# Patient Record
Sex: Male | Born: 1960 | Race: White | Hispanic: Refuse to answer | Marital: Married | Smoking: Former smoker
Health system: Southern US, Community
[De-identification: ages and names within clinical notes are randomized; demographics above are authoritative.]

## PROBLEM LIST (undated history)

## (undated) DIAGNOSIS — G8929 Other chronic pain: Secondary | ICD-10-CM

## (undated) DIAGNOSIS — J019 Acute sinusitis, unspecified: Secondary | ICD-10-CM

## (undated) DIAGNOSIS — N529 Male erectile dysfunction, unspecified: Secondary | ICD-10-CM

## (undated) DIAGNOSIS — M1990 Unspecified osteoarthritis, unspecified site: Secondary | ICD-10-CM

## (undated) DIAGNOSIS — F900 Attention-deficit hyperactivity disorder, predominantly inattentive type: Secondary | ICD-10-CM

## (undated) DIAGNOSIS — G4700 Insomnia, unspecified: Secondary | ICD-10-CM

## (undated) DIAGNOSIS — F329 Major depressive disorder, single episode, unspecified: Secondary | ICD-10-CM

## (undated) DIAGNOSIS — F32A Depression, unspecified: Secondary | ICD-10-CM

## (undated) DIAGNOSIS — E291 Testicular hypofunction: Secondary | ICD-10-CM

## (undated) DIAGNOSIS — L502 Urticaria due to cold and heat: Secondary | ICD-10-CM

## (undated) DIAGNOSIS — N400 Enlarged prostate without lower urinary tract symptoms: Secondary | ICD-10-CM

## (undated) DIAGNOSIS — N200 Calculus of kidney: Secondary | ICD-10-CM

## (undated) DIAGNOSIS — G5602 Carpal tunnel syndrome, left upper limb: Secondary | ICD-10-CM

## (undated) DIAGNOSIS — F419 Anxiety disorder, unspecified: Secondary | ICD-10-CM

## (undated) HISTORY — DX: Unspecified osteoarthritis, unspecified site: M19.90

## (undated) HISTORY — DX: Carpal tunnel syndrome, left upper limb: G56.02

## (undated) HISTORY — DX: Male erectile dysfunction, unspecified: N52.9

## (undated) HISTORY — DX: Testicular hypofunction: E29.1

## (undated) HISTORY — PX: APPENDECTOMY: SHX54

## (undated) HISTORY — DX: Benign prostatic hyperplasia without lower urinary tract symptoms: N40.0

## (undated) HISTORY — PX: FOOT SURGERY: SHX648

## (undated) HISTORY — PX: CERVICAL FUSION: SHX112

## (undated) HISTORY — DX: Other chronic pain: G89.29

## (undated) HISTORY — PX: LUMBAR DISC SURGERY: SHX700

## (undated) HISTORY — DX: Depression, unspecified: F32.A

## (undated) HISTORY — DX: Acute sinusitis, unspecified: J01.90

## (undated) HISTORY — DX: Anxiety disorder, unspecified: F41.9

## (undated) HISTORY — DX: Major depressive disorder, single episode, unspecified: F32.9

## (undated) HISTORY — DX: Calculus of kidney: N20.0

## (undated) HISTORY — DX: Attention-deficit hyperactivity disorder, predominantly inattentive type: F90.0

## (undated) HISTORY — DX: Insomnia, unspecified: G47.00

## (undated) HISTORY — DX: Urticaria due to cold and heat: L50.2

---

## 1998-08-05 ENCOUNTER — Emergency Department (HOSPITAL_COMMUNITY): Admission: EM | Admit: 1998-08-05 | Discharge: 1998-08-05 | Payer: Self-pay | Admitting: Internal Medicine

## 1998-08-05 ENCOUNTER — Encounter: Payer: Self-pay | Admitting: Internal Medicine

## 1998-12-16 ENCOUNTER — Emergency Department (HOSPITAL_COMMUNITY): Admission: EM | Admit: 1998-12-16 | Discharge: 1998-12-16 | Payer: Self-pay | Admitting: Emergency Medicine

## 1998-12-16 ENCOUNTER — Encounter: Payer: Self-pay | Admitting: Emergency Medicine

## 1999-04-07 ENCOUNTER — Emergency Department (HOSPITAL_COMMUNITY): Admission: EM | Admit: 1999-04-07 | Discharge: 1999-04-07 | Payer: Self-pay | Admitting: Emergency Medicine

## 1999-04-07 ENCOUNTER — Encounter: Payer: Self-pay | Admitting: Emergency Medicine

## 1999-04-16 ENCOUNTER — Encounter: Payer: Self-pay | Admitting: Internal Medicine

## 1999-04-16 ENCOUNTER — Emergency Department (HOSPITAL_COMMUNITY): Admission: EM | Admit: 1999-04-16 | Discharge: 1999-04-17 | Payer: Self-pay | Admitting: Internal Medicine

## 1999-04-19 ENCOUNTER — Inpatient Hospital Stay (HOSPITAL_COMMUNITY): Admission: EM | Admit: 1999-04-19 | Discharge: 1999-04-23 | Payer: Self-pay | Admitting: Emergency Medicine

## 1999-04-21 ENCOUNTER — Encounter: Payer: Self-pay | Admitting: Urology

## 1999-04-23 ENCOUNTER — Encounter: Payer: Self-pay | Admitting: Urology

## 1999-05-21 ENCOUNTER — Ambulatory Visit (HOSPITAL_COMMUNITY): Admission: RE | Admit: 1999-05-21 | Discharge: 1999-05-21 | Payer: Self-pay | Admitting: Gastroenterology

## 1999-05-21 ENCOUNTER — Encounter: Payer: Self-pay | Admitting: Gastroenterology

## 1999-06-03 ENCOUNTER — Ambulatory Visit (HOSPITAL_COMMUNITY): Admission: RE | Admit: 1999-06-03 | Discharge: 1999-06-03 | Payer: Self-pay | Admitting: Gastroenterology

## 1999-06-03 ENCOUNTER — Encounter: Payer: Self-pay | Admitting: Gastroenterology

## 2000-12-27 ENCOUNTER — Emergency Department (HOSPITAL_COMMUNITY): Admission: EM | Admit: 2000-12-27 | Discharge: 2000-12-27 | Payer: Self-pay | Admitting: Emergency Medicine

## 2000-12-27 ENCOUNTER — Encounter: Payer: Self-pay | Admitting: Emergency Medicine

## 2001-01-01 ENCOUNTER — Emergency Department (HOSPITAL_COMMUNITY): Admission: EM | Admit: 2001-01-01 | Discharge: 2001-01-01 | Payer: Self-pay | Admitting: Emergency Medicine

## 2001-01-05 ENCOUNTER — Encounter: Payer: Self-pay | Admitting: Urology

## 2001-01-05 ENCOUNTER — Ambulatory Visit (HOSPITAL_COMMUNITY): Admission: RE | Admit: 2001-01-05 | Discharge: 2001-01-05 | Payer: Self-pay | Admitting: Urology

## 2001-05-31 ENCOUNTER — Encounter: Admission: RE | Admit: 2001-05-31 | Discharge: 2001-08-29 | Payer: Self-pay

## 2001-08-04 ENCOUNTER — Emergency Department (HOSPITAL_COMMUNITY): Admission: EM | Admit: 2001-08-04 | Discharge: 2001-08-04 | Payer: Self-pay | Admitting: Emergency Medicine

## 2001-09-29 ENCOUNTER — Encounter: Admission: RE | Admit: 2001-09-29 | Discharge: 2001-12-28 | Payer: Self-pay | Admitting: Anesthesiology

## 2002-01-04 ENCOUNTER — Encounter: Admission: RE | Admit: 2002-01-04 | Discharge: 2002-04-04 | Payer: Self-pay

## 2002-05-03 ENCOUNTER — Encounter: Admission: RE | Admit: 2002-05-03 | Discharge: 2002-06-26 | Payer: Self-pay

## 2002-07-16 ENCOUNTER — Emergency Department (HOSPITAL_COMMUNITY): Admission: EM | Admit: 2002-07-16 | Discharge: 2002-07-16 | Payer: Self-pay | Admitting: Emergency Medicine

## 2004-07-31 ENCOUNTER — Ambulatory Visit: Payer: Self-pay | Admitting: Family Medicine

## 2005-08-16 ENCOUNTER — Encounter: Admission: RE | Admit: 2005-08-16 | Discharge: 2005-08-16 | Payer: Self-pay | Admitting: Family Medicine

## 2005-08-16 ENCOUNTER — Ambulatory Visit: Payer: Self-pay | Admitting: Family Medicine

## 2005-10-15 ENCOUNTER — Encounter: Admission: RE | Admit: 2005-10-15 | Discharge: 2005-10-15 | Payer: Self-pay | Admitting: Psychiatry

## 2006-01-26 ENCOUNTER — Encounter: Payer: Self-pay | Admitting: Urology

## 2006-01-26 ENCOUNTER — Encounter: Payer: Self-pay | Admitting: Emergency Medicine

## 2006-04-26 ENCOUNTER — Ambulatory Visit: Payer: Self-pay | Admitting: Family Medicine

## 2006-05-03 ENCOUNTER — Ambulatory Visit: Payer: Self-pay | Admitting: Family Medicine

## 2006-09-27 HISTORY — PX: CERVICAL FUSION: SHX112

## 2006-12-20 ENCOUNTER — Ambulatory Visit: Payer: Self-pay | Admitting: Internal Medicine

## 2007-01-03 ENCOUNTER — Encounter: Admission: RE | Admit: 2007-01-03 | Discharge: 2007-01-03 | Payer: Self-pay | Admitting: Cardiology

## 2007-05-03 ENCOUNTER — Ambulatory Visit: Payer: Self-pay | Admitting: Family Medicine

## 2007-05-03 DIAGNOSIS — L259 Unspecified contact dermatitis, unspecified cause: Secondary | ICD-10-CM | POA: Insufficient documentation

## 2007-07-21 ENCOUNTER — Ambulatory Visit: Payer: Self-pay | Admitting: Family Medicine

## 2007-07-21 LAB — CONVERTED CEMR LAB
Glucose, Urine, Semiquant: NEGATIVE
Ketones, urine, test strip: NEGATIVE
Nitrite: NEGATIVE
Specific Gravity, Urine: >=1.03
Urobilinogen, UA: 0.2
WBC Urine, dipstick: NEGATIVE
pH: 5

## 2007-07-24 LAB — CONVERTED CEMR LAB
AST: 36 units/L (ref 0–37)
Basophils Absolute: 0 10*3/uL (ref 0.0–0.1)
CO2: 31 meq/L (ref 19–32)
Eosinophils Relative: 1.1 % (ref 0.0–5.0)
GFR calc Af Amer: 84 mL/min
GFR calc non Af Amer: 70 mL/min
Glucose, Bld: 88 mg/dL (ref 70–99)
HCT: 43.4 % (ref 39.0–52.0)
Hemoglobin: 15.3 g/dL (ref 13.0–17.0)
Lymphocytes Relative: 20.4 % (ref 12.0–46.0)
Monocytes Relative: 5.9 % (ref 3.0–11.0)
Neutrophils Relative %: 72.4 % (ref 43.0–77.0)
Platelets: 208 10*3/uL (ref 150–400)
RDW: 12.1 % (ref 11.5–14.6)
TSH: 0.93 microintl units/mL (ref 0.35–5.50)
Total Bilirubin: 0.9 mg/dL (ref 0.3–1.2)
WBC: 7.7 10*3/uL (ref 4.5–10.5)

## 2007-08-28 ENCOUNTER — Ambulatory Visit: Payer: Self-pay | Admitting: Family Medicine

## 2007-08-28 DIAGNOSIS — G894 Chronic pain syndrome: Secondary | ICD-10-CM | POA: Insufficient documentation

## 2007-08-28 DIAGNOSIS — J309 Allergic rhinitis, unspecified: Secondary | ICD-10-CM | POA: Insufficient documentation

## 2007-08-28 DIAGNOSIS — G8929 Other chronic pain: Secondary | ICD-10-CM | POA: Insufficient documentation

## 2007-08-28 DIAGNOSIS — T783XXA Angioneurotic edema, initial encounter: Secondary | ICD-10-CM | POA: Insufficient documentation

## 2007-09-06 ENCOUNTER — Ambulatory Visit: Payer: Self-pay | Admitting: Family Medicine

## 2007-09-06 ENCOUNTER — Ambulatory Visit: Payer: Self-pay | Admitting: Cardiovascular Disease

## 2007-09-06 DIAGNOSIS — R109 Unspecified abdominal pain: Secondary | ICD-10-CM | POA: Insufficient documentation

## 2007-09-06 LAB — CONVERTED CEMR LAB
Blood in Urine, dipstick: NEGATIVE
Protein, U semiquant: NEGATIVE
Specific Gravity, Urine: >=1.03
Urobilinogen, UA: 0.2
WBC Urine, dipstick: NEGATIVE
pH: 5

## 2007-09-07 ENCOUNTER — Telehealth: Payer: Self-pay | Admitting: Family Medicine

## 2007-09-13 ENCOUNTER — Ambulatory Visit: Payer: Self-pay | Admitting: Urology

## 2008-01-11 ENCOUNTER — Ambulatory Visit: Payer: Self-pay | Admitting: Family Medicine

## 2008-01-11 LAB — CONVERTED CEMR LAB
Cholesterol: 232 mg/dL (ref 0–200)
Direct LDL: 183.3 mg/dL
HDL: 36.8 mg/dL — ABNORMAL LOW (ref >39.0)
Total CHOL/HDL Ratio: 6.3
Triglycerides: 82 mg/dL (ref 0–149)

## 2008-01-15 ENCOUNTER — Telehealth: Payer: Self-pay | Admitting: Family Medicine

## 2008-03-13 ENCOUNTER — Emergency Department: Payer: Self-pay | Admitting: Emergency Medicine

## 2008-03-15 ENCOUNTER — Encounter: Admission: RE | Admit: 2008-03-15 | Discharge: 2008-03-15 | Payer: Self-pay | Admitting: Anesthesiology

## 2008-06-30 DIAGNOSIS — L255 Unspecified contact dermatitis due to plants, except food: Secondary | ICD-10-CM | POA: Insufficient documentation

## 2008-07-02 ENCOUNTER — Ambulatory Visit: Payer: Self-pay | Admitting: Family Medicine

## 2008-10-03 ENCOUNTER — Ambulatory Visit: Payer: Self-pay | Admitting: Family Medicine

## 2008-10-24 ENCOUNTER — Telehealth: Payer: Self-pay | Admitting: Family Medicine

## 2008-11-04 ENCOUNTER — Telehealth: Payer: Self-pay | Admitting: *Deleted

## 2008-11-04 ENCOUNTER — Encounter: Payer: Self-pay | Admitting: Family Medicine

## 2008-11-04 ENCOUNTER — Telehealth: Payer: Self-pay | Admitting: Family Medicine

## 2009-01-17 ENCOUNTER — Ambulatory Visit: Payer: Self-pay | Admitting: Family Medicine

## 2009-01-17 LAB — CONVERTED CEMR LAB
ALT: 27 units/L (ref 0–53)
AST: 26 units/L (ref 0–37)
Alkaline Phosphatase: 60 units/L (ref 39–117)
BUN: 16 mg/dL (ref 6–23)
Basophils Absolute: 0 10*3/uL (ref 0.0–0.1)
Basophils Relative: 0.1 % (ref 0.0–3.0)
Chloride: 106 meq/L (ref 96–112)
Cholesterol: 226 mg/dL — ABNORMAL HIGH (ref 0–200)
Creatinine, Ser: 1 mg/dL (ref 0.4–1.5)
Eosinophils Absolute: 0 10*3/uL (ref 0.0–0.7)
GFR calc non Af Amer: 85.25 mL/min (ref >60)
Glucose, Urine, Semiquant: NEGATIVE
HCT: 46.8 % (ref 39.0–52.0)
Hemoglobin: 15.8 g/dL (ref 13.0–17.0)
Lymphocytes Relative: 23 % (ref 12.0–46.0)
MCHC: 33.7 g/dL (ref 30.0–36.0)
MCV: 88 fL (ref 78.0–100.0)
Neutro Abs: 4.8 10*3/uL (ref 1.4–7.7)
Platelets: 217 10*3/uL (ref 150.0–400.0)
RBC: 5.32 M/uL (ref 4.22–5.81)
RDW: 12.2 % (ref 11.5–14.6)
Specific Gravity, Urine: 1.02
TSH: 0.42 microintl units/mL (ref 0.35–5.50)
Total Bilirubin: 1 mg/dL (ref 0.3–1.2)
Triglycerides: 59 mg/dL (ref 0.0–149.0)
Urobilinogen, UA: 0.2

## 2009-01-21 ENCOUNTER — Telehealth: Payer: Self-pay | Admitting: Family Medicine

## 2009-02-28 ENCOUNTER — Ambulatory Visit: Payer: Self-pay | Admitting: Family Medicine

## 2009-02-28 DIAGNOSIS — G4700 Insomnia, unspecified: Secondary | ICD-10-CM | POA: Insufficient documentation

## 2009-03-19 ENCOUNTER — Encounter: Admission: RE | Admit: 2009-03-19 | Discharge: 2009-03-19 | Payer: Self-pay | Admitting: Orthopaedic Surgery

## 2009-05-01 ENCOUNTER — Telehealth: Payer: Self-pay | Admitting: Family Medicine

## 2009-05-02 ENCOUNTER — Ambulatory Visit: Payer: Self-pay | Admitting: Family Medicine

## 2009-05-02 LAB — CONVERTED CEMR LAB
Free T4: 1 ng/dL (ref 0.6–1.6)
T3, Free: 3.3 pg/mL (ref 2.3–4.2)

## 2009-05-19 ENCOUNTER — Telehealth: Payer: Self-pay | Admitting: *Deleted

## 2009-05-22 ENCOUNTER — Ambulatory Visit: Payer: Self-pay | Admitting: Family Medicine

## 2009-05-22 DIAGNOSIS — R5381 Other malaise: Secondary | ICD-10-CM | POA: Insufficient documentation

## 2009-05-22 DIAGNOSIS — R5383 Other fatigue: Secondary | ICD-10-CM

## 2009-06-13 ENCOUNTER — Ambulatory Visit: Payer: Self-pay | Admitting: Endocrinology

## 2009-06-13 DIAGNOSIS — E291 Testicular hypofunction: Secondary | ICD-10-CM | POA: Insufficient documentation

## 2009-06-13 DIAGNOSIS — E230 Hypopituitarism: Secondary | ICD-10-CM | POA: Insufficient documentation

## 2009-06-13 LAB — CONVERTED CEMR LAB
CO2: 31 meq/L (ref 19–32)
Chloride: 105 meq/L (ref 96–112)
Creatinine, Ser: 1.2 mg/dL (ref 0.4–1.5)
Saturation Ratios: 27.3 % (ref 20.0–50.0)
Testosterone: 236.88 ng/dL — ABNORMAL LOW (ref 350.00–890.00)
Transferrin: 246.2 mg/dL (ref 212.0–360.0)

## 2009-06-17 ENCOUNTER — Telehealth: Payer: Self-pay | Admitting: Endocrinology

## 2009-07-09 ENCOUNTER — Telehealth (INDEPENDENT_AMBULATORY_CARE_PROVIDER_SITE_OTHER): Payer: Self-pay | Admitting: *Deleted

## 2009-07-18 ENCOUNTER — Telehealth: Payer: Self-pay | Admitting: Endocrinology

## 2009-07-22 ENCOUNTER — Encounter: Admission: RE | Admit: 2009-07-22 | Discharge: 2009-07-22 | Payer: Self-pay | Admitting: Endocrinology

## 2009-08-12 ENCOUNTER — Telehealth: Payer: Self-pay | Admitting: Family Medicine

## 2009-09-01 ENCOUNTER — Telehealth: Payer: Self-pay | Admitting: Family Medicine

## 2009-11-06 ENCOUNTER — Encounter: Payer: Self-pay | Admitting: Family Medicine

## 2009-11-06 HISTORY — PX: COLONOSCOPY: SHX174

## 2010-01-27 ENCOUNTER — Ambulatory Visit: Payer: Self-pay | Admitting: Family Medicine

## 2010-01-27 DIAGNOSIS — J029 Acute pharyngitis, unspecified: Secondary | ICD-10-CM | POA: Insufficient documentation

## 2010-02-02 ENCOUNTER — Telehealth: Payer: Self-pay | Admitting: Family Medicine

## 2010-02-02 ENCOUNTER — Ambulatory Visit: Payer: Self-pay | Admitting: Family Medicine

## 2010-07-01 ENCOUNTER — Ambulatory Visit: Payer: Self-pay | Admitting: Family Medicine

## 2010-07-01 LAB — CONVERTED CEMR LAB
Alkaline Phosphatase: 54 units/L (ref 39–117)
BUN: 18 mg/dL (ref 6–23)
Blood in Urine, dipstick: NEGATIVE
CO2: 30 meq/L (ref 19–32)
Calcium: 9.6 mg/dL (ref 8.4–10.5)
Chloride: 102 meq/L (ref 96–112)
Creatinine, Ser: 1.3 mg/dL (ref 0.4–1.5)
Eosinophils Absolute: 0.1 10*3/uL (ref 0.0–0.7)
Eosinophils Relative: 0.7 % (ref 0.0–5.0)
Glucose, Urine, Semiquant: NEGATIVE
Lymphocytes Relative: 15.4 % (ref 12.0–46.0)
Lymphs Abs: 1.5 10*3/uL (ref 0.7–4.0)
Monocytes Absolute: 0.4 10*3/uL (ref 0.1–1.0)
Monocytes Relative: 4.5 % (ref 3.0–12.0)
Platelets: 240 10*3/uL (ref 150.0–400.0)
Potassium: 4.3 meq/L (ref 3.5–5.1)
RBC: 5.49 M/uL (ref 4.22–5.81)
RDW: 13.2 % (ref 11.5–14.6)
TSH: 0.55 microintl units/mL (ref 0.35–5.50)
Triglycerides: 112 mg/dL (ref 0.0–149.0)
Urobilinogen, UA: 0.2
VLDL: 22.4 mg/dL (ref 0.0–40.0)
WBC Urine, dipstick: NEGATIVE
pH: 5

## 2010-07-20 ENCOUNTER — Encounter: Payer: Self-pay | Admitting: Family Medicine

## 2010-07-20 ENCOUNTER — Ambulatory Visit: Payer: Self-pay | Admitting: Family Medicine

## 2010-07-29 ENCOUNTER — Encounter: Payer: Self-pay | Admitting: Family Medicine

## 2010-10-17 ENCOUNTER — Encounter: Payer: Self-pay | Admitting: Psychiatry
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
## 2010-10-29 NOTE — Miscellaneous (Signed)
Summary: tdap update  Clinical Lists Changes  Observations: Added new observation of TD BOOSTER: Historical (09/27/2000 11:49)      Immunization History:  Tetanus/Td Immunization History:    Tetanus/Td:  historical (09/27/2000)

## 2010-10-29 NOTE — Progress Notes (Signed)
Summary: REQ FOR RX (ABX?)  Phone Note Call from Patient   Caller: Spouse    [phone] Reason for Call: Talk to Nurse, Talk to Doctor Summary of Call: Pts wife called in to speak with Fleet Contras, CMA / Dr Tawanna Cooler.... wanted to adv that her husband is not any better - still exp h/a, coughing up greenish, yellow colored mucus, sinus congestion and pressure..... Req that Rx (abx?) be sent into [facility], [place].   Initial call taken by: Debbra Riding,  Feb 02, 2010 11:55 AM  Follow-up for Phone Call        if he is not any better.  I need to see and I can't call in medication without examining him.  Okay to work in today Follow-up by: Roderick Pee MD,  Feb 02, 2010 12:07 PM  Additional Follow-up for Phone Call Additional follow up Details #1::        Phone Call Completed  -  Contacted pt and he agreed to come in today at 2pm for eval of sxs.  Additional Follow-up by: Debbra Riding,  Feb 02, 2010 12:14 PM

## 2010-10-29 NOTE — Assessment & Plan Note (Signed)
Summary: st/njr   Vital Signs:  Patient profile:   50 year old male Weight:      159 pounds Temp:     98.3 degrees F oral BP sitting:   116 / 80  (left arm) Cuff size:   regular  Vitals Entered By: Kern Reap CMA Duncan Dull) (Jan 27, 2010 12:45 PM) CC: sore throat, productive cough Is Patient Diabetic? No Pain Assessment Patient in pain? yes        Primary Care Provider:  Dr Tawanna Cooler  CC:  sore throat and productive cough.  History of Present Illness: Elohim is a 50 year old [place] male, nonsmoker, who comes in today with a week's history of sore throat, cough.  History of fever.  Review of systems otherwise negative.  He is not been in contact with anybody.  He said any infectious diseases  Allergies: 1)  ! Toradol 2)  ! Codeine  Social History: Reviewed history from 08/28/2007 and no changes required. Married Former Smoker Alcohol use-no Drug use-no Regular exercise-yes Retired Surveyor, minerals by trade, totally disabled  Review of Systems      See HPI  Physical Exam  General:  Well-developed,well-nourished,in no acute distress; alert,appropriate and cooperative throughout examination Head:  Normocephalic and atraumatic without obvious abnormalities. No apparent alopecia or balding. Eyes:  No corneal or conjunctival inflammation noted. EOMI. Perrla. Funduscopic exam benign, without hemorrhages, exudates or papilledema. Vision grossly normal. Ears:  External ear exam shows no significant lesions or deformities.  Otoscopic examination reveals clear canals, tympanic membranes are intact bilaterally without bulging, retraction, inflammation or discharge. Hearing is grossly normal bilaterally. Nose:  External nasal examination shows no deformity or inflammation. Nasal mucosa are pink and moist without lesions or exudates. Mouth:  Oral mucosa and oropharynx without lesions or exudates.  Teeth in good repair. Neck:  No deformities, masses, or tenderness noted. Chest  Wall:  No deformities, masses, tenderness or gynecomastia noted. Lungs:  Normal respiratory effort, chest expands symmetrically. Lungs are clear to auscultation, no crackles or wheezes.   Impression & Recommendations:  Problem # 1:  SORE THROAT (ICD-462) Assessment New  Orders: Rapid Strep ([address])  Complete Medication List: 1)  Zyrtec Allergy 10 Mg Tabs (Cetirizine hcl) .... As needed 2)  Skelaxin 800 Mg Tabs (Metaxalone) .... Once daily as needed 3)  Duragesic-100 100 Mcg/hr Pt72 (Fentanyl) .... Once every 48 hrs 4)  Trazodone Hcl 50 Mg Tabs (Trazodone hcl) .Marland Kitchen.. 1 &1/2 at bedtime 5)  Voltaren 1 % Gel (Diclofenac sodium) .Marland Kitchen.. 1 gram as needed 6)  Epipen 2-pak 0.3 Mg/0.81ml (1:1000) Devi (Epinephrine hcl (anaphylaxis)) .... As needed 7)  Oxycodone Hcl 15 Mg Tabs (Oxycodone hcl) .... Take half to one tab every six hours as needed for pain 8)  Prednisone 20 Mg Tabs (Prednisone) .... Uad 9)  Fluticasone Propionate 50 Mcg/act Susp (Fluticasone propionate) .... 2 sprays per nostrile two times a day 10)  Acyclovir 800 Mg Tabs (Acyclovir) .Marland Kitchen.. 1 &1/2 daily 11)  Valium 10 Mg Tabs (Diazepam) .... Take one tab three times a day 12)  Clomiphene Citrate 50 Mg Tabs (Clomiphene citrate) .... 1/4 tab qd 13)  Restoril 15 Mg Caps (Temazepam) .... Take one tab by mouth once daily 14)  Hydromet 5-1.5 Mg/46ml Syrp (Hydrocodone-homatropine) .Marland Kitchen.. 1 or 2 tsps three times a day prn  Patient Instructions: 1)  drank 40 ounces of water daily, swish and spit with Xylocaine, every two to 3 hours as needed for sore throat, pain. 2)  You may also  take one or 2 teaspoons of Hydromet, up to 3 times a day to help with the cough and sore throat pain.  Return p.r.n. Prescriptions: PREDNISONE 20 MG TABS (PREDNISONE) UAD  #30 x 1   Entered and Authorized by:   Roderick Pee MD   Signed by:   Roderick Pee MD on 01/27/2010   Method used:   Electronically to        [facility]* (retail)       [address]        [place], [place]  [address]       Ph: [phone]       Fax: [phone]   RxID:   [id number] HYDROMET 5-1.5 MG/5ML SYRP (HYDROCODONE-HOMATROPINE) 1 or 2 tsps three times a day prn  #8oz x 0   Entered and Authorized by:   Roderick Pee MD   Signed by:   Roderick Pee MD on 01/27/2010   Method used:   Print then Give to Patient   RxID:   [id number]  In the ER and

## 2010-10-29 NOTE — Assessment & Plan Note (Signed)
Summary: SINUSITIS // RS   Vital Signs:  Patient profile:   50 year old male Temp:     98 degrees F Resp:     12 per minute BP sitting:   110 / 84  (left arm) CC: Sore Throat/ Migraine Headache   Primary Care Provider:  Dr Tawanna Cooler  CC:  Sore Throat/ Migraine Headache.  History of Present Illness: [name] is a 50 year old, married male, nonsmoker, who comes in today for evaluation of a headache.  He states he was well until last Friday night, around 10 p.m. he developed the sudden onset of a severe right occipital headache.  By about 11 o'clock.  It was very intense.  He woke his wife up it hurts so bad.  She gave him some of her migraine medication and headache slowly abated over about 8 hours.  Now the headache persists, but is dull.  His worse problem is a severe sore throat that won't go away.  He said no fever, but a right-sided earache.  Nonproductive cough.  We have seen him last week.  I made the thirdfor a viral syndrome  Allergies: 1)  ! Toradol 2)  ! Codeine  Past History:  Past medical, surgical, family and social histories (including risk factors) reviewed for relevance to current acute and chronic problems.  Past Medical History: Reviewed history from 09/06/2007 and no changes required. lumbar disk surgery x 2 chronic pain, sees Dr. Rosalita Chessman Hedgecock history hip C. normal.  LFTs cold urticaria, etiology unknown appendectomy surgery at [facility], 1999 with Senaida Ores, Cage failed to relieve pain kidney stones x 2  Past Surgical History: Reviewed history from 10/03/2008 and no changes required. Cervical fusion   09 Cervical laminectomy  09  Family History: Reviewed history from 08/28/2007 and no changes required. Family History of CAD Male 1st degree relative <60 Family History Diabetes 1st degree relative Family History High cholesterol father also had gout  Social History: Reviewed history from 08/28/2007 and no changes required. Married Former  Smoker Alcohol use-no Drug use-no Regular exercise-yes Retired Surveyor, minerals by trade, totally disabled  Review of Systems      See HPI  Physical Exam  General:  Well-developed,well-nourished,in no acute distress; alert,appropriate and cooperative throughout examination Head:  Normocephalic and atraumatic without obvious abnormalities. No apparent alopecia or balding. Eyes:  No corneal or conjunctival inflammation noted. EOMI. Perrla. Funduscopic exam benign, without hemorrhages, exudates or papilledema. Vision grossly normal. Ears:  External ear exam shows no significant lesions or deformities.  Otoscopic examination reveals clear canals, tympanic membranes are intact bilaterally without bulging, retraction, inflammation or discharge. Hearing is grossly normal bilaterally. Mouth:  bilateral exudative  pharyngitis Neck:  No deformities, masses, or tenderness noted. Chest Wall:  No deformities, masses, tenderness or gynecomastia noted. Lungs:  Normal respiratory effort, chest expands symmetrically. Lungs are clear to auscultation, no crackles or wheezes.   Impression & Recommendations:  Problem # 1:  STREPTOCOCCAL PHARYNGITIS (ICD-034.0) Assessment New  His updated medication list for this problem includes:    Amoxicillin 500 Mg Caps (Amoxicillin) .Marland Kitchen... Take 1 tablet by mouth two times a day  Orders: Rapid Strep ([address])  Complete Medication List: 1)  Zyrtec Allergy 10 Mg Tabs (Cetirizine hcl) .... As needed 2)  Skelaxin 800 Mg Tabs (Metaxalone) .... Once daily as needed 3)  Duragesic-100 100 Mcg/hr Pt72 (Fentanyl) .... Once every 48 hrs 4)  Trazodone Hcl 50 Mg Tabs (Trazodone hcl) .Marland Kitchen.. 1 &1/2 at bedtime 5)  Voltaren 1 % Gel (Diclofenac  sodium) .Marland Kitchen.. 1 gram as needed 6)  Epipen 2-pak 0.3 Mg/0.50ml (1:1000) Devi (Epinephrine hcl (anaphylaxis)) .... As needed 7)  Oxycodone Hcl 15 Mg Tabs (Oxycodone hcl) .... Take half to one tab every six hours as needed for pain 8)  Prednisone  20 Mg Tabs (Prednisone) .... Uad 9)  Fluticasone Propionate 50 Mcg/act Susp (Fluticasone propionate) .... 2 sprays per nostrile two times a day 10)  Acyclovir 800 Mg Tabs (Acyclovir) .Marland Kitchen.. 1 &1/2 daily 11)  Valium 10 Mg Tabs (Diazepam) .... Take one tab three times a day 12)  Clomiphene Citrate 50 Mg Tabs (Clomiphene citrate) .... 1/4 tab qd 13)  Restoril 15 Mg Caps (Temazepam) .... Take one tab by mouth once daily 14)  Hydromet 5-1.5 Mg/34ml Syrp (Hydrocodone-homatropine) .Marland Kitchen.. 1 or 2 tsps three times a day prn 15)  Amoxicillin 500 Mg Caps (Amoxicillin) .... Take 1 tablet by mouth two times a day  Patient Instructions: 1)  amoxicillin 500 mg directions two in the a.m., one in the p.m., x 10 days, Prescriptions: AMOXICILLIN 500 MG CAPS (AMOXICILLIN) Take 1 tablet by mouth two times a day  #30 x 1   Entered and Authorized by:   Roderick Pee MD   Signed by:   Roderick Pee MD on 02/02/2010   Method used:   Electronically to        [facility]* (retail)       [address]       [place], [place]  [address]       Ph: [phone]       Fax: [phone]   RxID:   [id number] AMOXICILLIN 500 MG CAPS (AMOXICILLIN) Take 1 tablet by mouth two times a day  #30 x 1   Entered and Authorized by:   Roderick Pee MD   Signed by:   Roderick Pee MD on 02/02/2010   Method used:   [facility]       [address]       [place], [place]  [address]       Ph: [phone]       Fax: [phone]   RxID:   [id number]   Appended Document: SINUSITIS // RS     Allergies: 1)  ! Toradol 2)  ! Codeine   Complete Medication List: 1)  Zyrtec Allergy 10 Mg Tabs (Cetirizine hcl) .... As needed 2)  Skelaxin 800 Mg Tabs (Metaxalone) .... Once daily as needed 3)  Duragesic-100 100 Mcg/hr Pt72 (Fentanyl) .... Once every 48 hrs 4)  Trazodone Hcl 50 Mg Tabs (Trazodone hcl) .Marland Kitchen.. 1 &1/2 at bedtime 5)  Voltaren 1 % Gel  (Diclofenac sodium) .Marland Kitchen.. 1 gram as needed 6)  Epipen 2-pak 0.3 Mg/0.31ml (1:1000) Devi (Epinephrine hcl (anaphylaxis)) .... As needed 7)  Oxycodone Hcl 15 Mg Tabs (Oxycodone hcl) .... Take half to one tab every six hours as needed for pain 8)  Prednisone 20 Mg Tabs (Prednisone) .... Uad 9)  Fluticasone Propionate 50 Mcg/act Susp (Fluticasone propionate) .... 2 sprays per nostrile two times a day 10)  Acyclovir 800 Mg Tabs (Acyclovir) .Marland Kitchen.. 1 &1/2 daily 11)  Valium 10 Mg Tabs (Diazepam) .... Take one tab three times a day 12)  Clomiphene Citrate 50 Mg Tabs (Clomiphene citrate) .... 1/4 tab qd 13)  Restoril 15 Mg Caps (Temazepam) .... Take one tab by mouth once daily 14)  Hydromet  5-1.5 Mg/13ml Syrp (Hydrocodone-homatropine) .Marland Kitchen.. 1 or 2 tsps three times a day prn 15)  Amoxicillin 500 Mg Caps (Amoxicillin) .... Take 1 tablet by mouth two times a day   Laboratory Results  Date/Time Received: Feb 02, 2010   Other Tests  Rapid Strep: negative Comments: Kern Reap CMA Duncan Dull)  Feb 02, 2010 4:24 PM

## 2011-02-12 NOTE — Consult Note (Signed)
[facility]  Patient:    [name], [name] Visit Number: [id number] MRN: [id number]          Service Type: PMG Location: [facility] Attending Physician:  Sondra Come Dictated by:   Sondra Come, D.O. Proc. Date: 01/10/02 Admit Date:  01/04/2002                            Consultation Report  REASON FOR VISIT:  Mr. Harkin returns to clinic today as scheduled for re-evaluation.  The patient was last seen on December 06, 2001.  Patient has been doing somewhat better over the past month.  He really has intentions of taking control of his pain with the ultimate goal of getting off of some of the medications.  He asked several questions and I answered them regarding his treatment options and future plans in terms of his pain management.  He is eager to start aquatic therapy, and I encourage him to do so.  His pain today is a 5/10 on a subjective scale involving his low back and radiating to his lower extremities.  He continues to use a cane for assistance, and we discussed ultimately discontinuing this.  He continues on Duragesic patch 75 mcg with OxyIR 5 mg two to three times per day for breakthrough pain.  He has had significant improvement with the Lidoderm patch, as he uses it at bedtime.  He also continues to take trazodone for restorative sleep capacity. His function and quality of life indices remain stable.  His sleep is improved.  He did not tolerate gabitril.  His exercise regimen has increased to every other day as opposed to every three days.  I reviewed health and history form and 14-point review of systems.  The patient continues to have excessive worry and continues to see Onalee Hua L. Dellia Cloud, Ph.D., in behavioral health.  PHYSICAL EXAMINATION:  GENERAL:  A healthy male in no acute distress.  VITAL SIGNS:  Blood pressure 144/77, pulse 76, respirations 12, O2 saturation 100% on room air.  MUSCULOSKELETAL/NEUROLOGIC:  Manual muscle testing is  5/5 bilateral lower extremities.  Sensory exam is intact to light touch bilateral lower extremities at this time.  Muscle stretch reflexes are 2+/4 bilateral patellar, medial hamstrings, and Achilles.  IMPRESSION: 1. Postlaminectomy syndrome. 2. Anxiety disorder.  PLAN: 1. Had a thorough discussion with Mr. Joost and his wife regarding treatment    options.  Again I encouraged him to begin aquatic therapy. 2. Continue Duragesic 75 mcg q.48h., #15 without refills. 3. Continue OxyIR 5 mg one p.o. t.i.d. p.r.n. breakthrough pain, #90    without refills. 4. Will start Baclofen 10 mg one-half to one p.o. t.i.d. as needed for its    _____ effects, #60 without refills. 5. Continue trazodone. 6. Consider weaning from Valium. 7. Discussed with patient further literature on back pain to include Mind    Over Back Pain by Dr. Imagene Riches. 8. Patient to return to clinic in one month for re-evaluation.  Patient was educated on the above findings and recommendations and understands.  No barriers to communication. Dictated by:   Sondra Come, D.O. Attending Physician:  Sondra Come DD:  01/10/02 TD:  01/11/02 Job: [id number]

## 2011-02-12 NOTE — Consult Note (Signed)
[facility]  Patient:    [name], [name] Visit Number: [id number] MRN: [id number]          Service Type: PMG Location: [facility] Attending Physician:  Rolly Salter Dictated by:   Jewel Baize Stevphen Rochester, M.D. Admit Date:  09/29/2001   CC:         Onalee Hua L. Dellia Cloud, Ph.D. [facility]                          Consultation Report  Mariana [facility].  I evaluate and review health and history form and 14 point review of systems.  His wife is present and I spent a considerable period of time discussing treatment limitations and options with him.  This consultation is in excess of 20 minutes and I discussed the reasonable approaches to dealing with this pain.  He has had a consultation with the spine surgeon and they have discouraged him from proceeding with either neuroplasty or dorsal column stimulation.  I do not really understand this rationale as I understand the international literature it is not an unreasonable expectation for some improvement in quality of life indexes, functional indexes, and decreased likelihood of escalation of narcotic based pain medications by addressing this approach.  We are going to continue him on his current medication.  I will go ahead and add Phenergan for some occasional nausea.  He continues with Dr. Dellia Cloud.  Do not believe further imaging or diagnostics are warranted.  He is stable at presentation today.  States no wish to harm himself or others.  Review of patient care agreement.  OBJECTIVE:  BACK:  He has diffuse paracervical, suprascapular, paralumbar myofascial discomfort.  Impaired flexion/extension of the lumbar spine.  NEUROLOGIC:  No new neurologic features-motor, sensory, reflexes.  IMPRESSION:  Degenerative spinal disease lumbar spine, post laminectomy syndrome.  PLAN:  Conservative management.  Consider him a likely candidate for neuroplasty or dorsal column  stimulation.  Will see him in follow-up. Extensive consultation. Dictated by:   Jewel Baize Stevphen Rochester, M.D. Attending Physician:  Rolly Salter DD:  10/03/01 TD:  10/03/01 Job: [id number]

## 2011-02-12 NOTE — Consult Note (Signed)
NAME:  [name], [name]                    ACCOUNT NO.:  [id number]   MEDICAL RECORD NO.:  [id number]                   PATIENT TYPE:  REC   LOCATION:  [facility]                                  FACILITY:  [facility]   PHYSICIAN:  Sondra Come, D.O.                 DATE OF BIRTH:  [birth year]   DATE OF CONSULTATION:  05/04/2002  DATE OF DISCHARGE:                  PHYSICAL MEDICINE & REHABILITATION CONSULTATION   HISTORY OF PRESENT ILLNESS:  The patient returns to clinic today as  scheduled for reevaluation.  He was last seen on April 05, 2002.  In the  interim, he had an exacerbation of his low back pain on April 18, 2002.  He  went to his family [place], Dr. Clent Ridges, who gave him a Demerol and Phenergan  shot as well as increased his OxyIR to q.i.d. as needed for breakthrough  pain.  Dr. Clent Ridges and I discussed this on the phone that day.  As of last  visit, the patient seemed to be doing fairly well with pain level ranging  around 4/10 on a subjective scale.  He was performing some exercises which  seemed to be helping him significantly with the goal of getting off of some  of his medications.  However, with this exacerbation, he notes constant pain  and numbness into his right lower extremity and a nondermatomal  distribution.  He was referred to see Dr. Gaetano Net, neurosurgeon, at [facility] in [place], [place], who ordered an MRI of his  lumbar spine with contrast at [facility].  The patient has a  follow-up next week with Dr. Gaetano Net in regard to any further surgical  intervention.  In addition, the patient tells me that his insurance company  has ordered a second functional capacity evaluation scheduled for May 10, 2002, and states that he had one previously but I do not have results of  that evaluation.  He continues on Duragesic 75 mcg q.48h. with OxyIR 5 mg  three to four times per day.  He also continues on Flexeril as needed for  spasm and he also  takes Valium five times a day for anxiety.  He is  accompanied today by his wife and his mother.  I reviewed the health and  history form and 14-point review of systems.   PHYSICAL EXAMINATION:  GENERAL APPEARANCE:  A healthy-appearing male in no  acute distress.  The patient is in mild obvious discomfort, however.  VITAL SIGNS:  Blood pressure 146/94, pulse 116, respiratory rate 18, O2  saturations 98% on room air.  NEUROLOGIC:  No new findings neurologically in the lower extremities  including motor, sensory and reflexes at this time.   IMPRESSION:  1. Post laminectomy syndrome with acute exacerbation of low back pain.  2. Anxiety disorder.  3. Depression.   PLAN:  1. I had a long discussion with the patient, his mother and his wife     regarding further treatment options.  We are somewhat limited in     medication options as the patient has not tolerated many of the     medications tried including increased Duragesic dose.  He has not     tolerated any of the membrane stabilizing medication to try to help with     his neuropathic and radicular component.  He also has worsening     constipation with increased narcotic dosages.  We discussed continuing     the current medications and waiting for the results of his MRI and     further treatment options per Dr. Gaetano Net.  Also discussed other possible     modalities such as a spinal cord simulator trial if he is not a surgical     candidate.  We have provided him with a video and booklet describing the     spinal cord stimulator.  2. Continue Duragesic 75 mcg q.48h. #15 without refills.  3. Continue OxyIR 5 mg one to two p.o. t.i.d. as needed #100 without     refills.  4. Will give prescription for Phenergan 25 mg one p.o. q.6h. as needed for     nausea, #30 with two refills.  5. The patient is to return to clinic in one month for reevaluation.   The patient was educated in the above findings and recommendations and  understands.   There were no barriers of communication.                                                Sondra Come, D.O.    JJW/MEDQ  D:  05/04/2002  T:  05/09/2002  Job:  [id number]   cc:   Jeannett Senior A. Clent Ridges, M.D. [facility]

## 2011-02-12 NOTE — Consult Note (Signed)
NAME:  [name], [name]                    ACCOUNT NO.:  [id number]   MEDICAL RECORD NO.:  [id number]                   PATIENT TYPE:  REC   LOCATION:  [facility]                                  FACILITY:  [facility]   PHYSICIAN:  Sondra Come, D.O.                 DATE OF BIRTH:  1961/01/04   DATE OF CONSULTATION:  DATE OF DISCHARGE:                                   CONSULTATION   HISTORY OF PRESENT ILLNESS:  The patient returns to the clinic today as  scheduled for reevaluation.  He was last seen on May 04, 2002.  In the  interim he has noted some mild improvement in his low back pain as he  relates it as a 5/10 on a subjective scale today.  His functional and  quality of life indices remain somewhat declined still but, overall, seem to  be improving to a modest degree.  His sleep is poor and seems to be his main  complaint today.  He has also had an MRI in the interim and has been  followed by Dr. Gaetano Net, who has not recommended surgery and states that he  did not see a pinched nerve in his back.  The patient continues to have some  nausea which is not relieved with Phenergan.  The patient has questions  about Marinol, apparently which his wife takes from her pain clinic.  He  continues with chigong exercise program.  He continues on Duragesic 75 mcg  q.48. and OxyIR 5 mg two to three per day which has been fairly stable over  the last year.  He also continues on trazodone without any improvement in  his sleep and has been on Ambien remotely and will discuss this with his  psychiatrist.  He presents today accompanied by his wife and mother who have  multiple questions.  These are answered.  I reviewed health and history form  and 14-point review of systems.   PHYSICAL EXAMINATION:  GENERAL:  Healthy male in no acute distress.  VITAL SIGNS:  Blood pressure is 123/81, pulse 97, respirations 18, O2  saturation is 97% on room air.  BACK:  Level pelvis without scoliosis.  There is a  midline incisional scar.  There is tenderness to palpation bilateral lumbar paraspinals.  No new  neurologic findings in the lower extremities including motor, sensory, and  reflexes.   IMPRESSION:  1. Postlaminectomy syndrome.  2. Anxiety disorder.  3. History of depression.   PLAN:  1. I had a long discussion with the patient, his wife, and his mother     regarding further treatment options.  This was an extensive consultation     of 25 minutes' duration.  At this point we will continue with Duragesic     75 mcg q.48h., #15 without refills.  2. Will continue with OxyIR 1 p.o. b.i.d. to t.i.d. as needed for     breakthrough pain, #75  without refills.  The patient does bring his pills     and used patches with him today for appropriate count.  3. Will give consideration to addition of Marinol if indicated.  I would     like to do some research in this regard.  4. Patient to continue with his home exercise program.  We discussed that     his best outcome is when he takes control of his pain.  Some of the steps     in doing this include a consistent home exercise program and, perhaps,     meditation.  This would likely improve his depressive and anxiety     components.  5. Patient to return to clinic in three months for reevaluation.  Patient     instructed that we will continue current medications over the next three     months and that he will need to come back on a monthly basis to pick up     his prescriptions as he has been stable on these medications for     approximately one year.  In addition, will obtain a urine drug screen     today with full informed consent.   The patient was educated on the above findings and recommendations and  understands.  There were no barriers to communication.                                                Sondra Come, D.O.    JJW/MEDQ  D:  06/01/2002  T:  06/01/2002  Job:  [id number]   cc:   Tera Mater. Clent Ridges, M.D. [facility]

## 2011-02-12 NOTE — Consult Note (Signed)
[place]  Patient:    [name], [name] Visit Number: [id number] MRN: [id number]          Service Type: PMG Location: [facility] Attending Physician:  Rolly Salter Dictated by:   Sondra Come, D.O. Proc. Date: 11/02/01 Admit Date:  09/29/2001                            Consultation Report  [name] returns to clinic today as scheduled for reevaluation.  He is accompanied by his wife.  He states that his pain overall is slightly improved.  He continues to have pain his low back and now complains of some pain in his parascapular region.  His pain level is a 7/10 on a subjective scale.  He continues on Duragesic 75 mcg q.48h. with OxyIR 5 mg t.i.d. as needed for pain.  He states that he is typically taking the OxyIr b.i.d. over the past several weeks.  He also continues with Baldemar Friday as well as trazodone.  He has been walking on a treadmill for 10-15 minutes every other day with some discomfort post exercise but he feels like this is helping him overall.  He also continues seeing Dr. Dellia Cloud for behavioral health on a monthly basis.  He is having daily bowel movements, but still complains of some constipation.  I review health and history form and 14 point review of systems.  PHYSICAL EXAMINATION  GENERAL:  Healthy male in no acute distress.  VITAL SIGNS:  Blood pressure 130/84, respirations 20, pulse 72.  NEUROLOGIC:  Manual muscle testing is 5/5 bilateral lower extremities. Sensory examination is intact to light touch bilateral lower extremities at this time.  Muscle stretch reflexes are 2+/4 bilateral lower extremities. BACK:  Palpatory examination reveals tenderness to palpation in the lumbar paraspinals as well as myofascial tenderness in the cervicothoracic paraspinals and scapular musculature.  IMPRESSION: 1. Post laminectomy syndrome. 2. Myofascial upper back pain.  PLAN: 1. Patient to continue Duragesic patch 75 mcg  q.48h. #15 without refills. 2. Continue OxyIR 5 mg one p.o. t.i.d. as needed for breakthrough pain #90    without refills. 3. Continue Kristalose 25 g powder one p.o. q.d. as needed #30 with three    refills. 4. Patient to resume scapular stabilization exercises to include shoulder    shrugs and shoulder retractions. 5. Will set patient up with [facility] for neuromuscular stimulation for his    upper back pain and lower back pain. 6. Patient will return to clinic in five to six weeks for reevaluation.  Patient was educated on the above findings and recommendations and understands.  There were no barriers to communication. Dictated by:   Sondra Come, D.O. Attending Physician:  Rolly Salter DD:  11/02/01 TD:  11/03/01 Job: [id number]

## 2011-02-12 NOTE — Consult Note (Signed)
[facility]  Patient:    [name], [name] Visit Number: [id number] MRN: [id number]          Service Type: PMG Attending Physician:  Sondra Come Dictated by:   Sondra Come, D.O. Proc. Date: 04/05/02 Admit Date:  01/04/2002 Discharge Date: 04/04/2002                            Consultation Report  HISTORY:  Mr. Barner returns to the clinic today as scheduled for reevaluation. He was last seen on March 09, 2002. He continues to complain of severe low back pain on occasion but today his pain level is 4/10 on a subjective scale. He states he is doing some better after starting to perform qigong exercises. His goal is to advance to taichi. He does complain of intermittent nausea which he attributes to his pain. He continues on Duragesic 75 mcg q. 48 hours and oxyIR 5 mg for breakthrough pain b.i.d. and as appropriate on pills. I reviewed the health and history form and 14 point review of systems. The patient continues to complain of neuropathic type pain into his right lower extremity.  PHYSICAL EXAMINATION:  Reveals a healthy male in no acute distress. Blood pressure 143/81, pulse 81, respirations 13, O2 saturation 97% on room air. Manual muscle testing is 5/5 bilateral lower extremities. Sensory examination reveals decreased light touch in the right lower extremity in the anterolateral thigh, also slightly into the lateral leg. Muscle stretch reflexes are 2+/4 bilateral patella, medial hamstrings and Achilles.  IMPRESSION: 1. Post laminectomy syndrome. 2. Anxiety disorder. 3. Depression.  PLAN: 1. Encouraged the patient to continue with his exercise program. 2. Will continue Duragesic 75 mg q. 48h #15 without refills for now.    Ultimately would like to start to wean as the patient continues with    his exercise program and shows functioning and quality of life index    improvement. 3. Continue oxyIR 5 mg one p.o. b.i.d. as needed for  breakthrough pain    only #60 without refills. 4. Will give the patient a trial of zonegran 100 mg 1 p.o. q.d. for    neuropathic component. 5. The patient to return to clinic in month for reevaluation.  The patient was educated on the above findings and recommendations and understands. There were no barriers to communication. Dictated by:   Sondra Come, D.O. Attending Physician:  Sondra Come DD:  04/05/02 TD:  04/07/02 Job: [id number]

## 2011-02-12 NOTE — Consult Note (Signed)
[facility]  Patient:    French, Ronald Visit Number: [id number] MRN: [id number]          Service Type: PMG Location: [facility] Attending Physician:  Sondra Come Dictated by:   Sondra Come, D.O. Proc. Date: 06/28/01 Admit Date:  05/31/2001                            Consultation Report  HISTORY OF PRESENT ILLNESS:  Mr. [name] returns to the clinic today as scheduled.  His initial visit was on May 31, 2001.  The patient has chronic low back pain following L5-S1 fusion in 1995 x 2 with nerve decompression.  The patient has had long-term medication management, mainly with narcotic analgesics and extensive workup and interventional procedures in the past.  He continues to complain of pain in his low back radiating intermittently to his right posterior thigh.  Pain is currently 7/10 on a subjective scale.  His pain is characterized as constant, sharp, stabbing, with numbness and tingling.  He was previously performing aquatic aerobic exercises but has stopped doing this for two reasons; #1, he states that he has financial concerns and, #2, he states that the water is too cold for him. He is waiting for the rehabilitation facility to complete a therapeutically heated pool.  He also states that he has noticed a decline in his ability to walk long distances and occasionally has to use a scooter at the store.  His last epidural steroid injection was either in 1998 or 1999; he could not remember for sure.  He continues to take Duragesic 75 mcg every two days, OxyIR 5 mg 2-5 pills per day, Valium 5 mg daily, and trazodone 50 mg at bedtime.  He has had trials of Neurontin which he was not able to tolerate secondary to dizziness and Topamax which caused nausea.  He denies any bowel and bladder dysfunction.  Denies constipation.  He continues to take ______ for that.  PHYSICAL EXAMINATION:  Healthy male in no acute distress.  There has been  no significant change in his examination.  Manual muscle testing is 5/5 bilateral lower extremities.  Sensory exam reveals a patchy decrease to light touch bilaterally in a nondermatome distribution.  Muscle stretch reflexes are 2+/4 bilateral patellae, medial hamstrings, and Achilles.  Lumbar paraspinals were diffusely tender to palpation.  Range of motion was guarded and decreased in all planes secondary to pain.  IMPRESSION:  Degenerative disk disease of the lumbar spine, status post lumbar surgery with L5-S1 Ray cage fusion.  The patient has chronic, persistent low back pain with intermittent right lower extremity radicular symptoms in an S1 distribution.  PLAN: 1. I discussed with the patient and his wife further therapeutic options.  I    voiced my concern regarding narcotic escalation, and the patient agrees and    is not interested in increasing his narcotic medications at this point    given that previous trials with increased doses of Duragesic made him    significantly somnolent as well as constipated.  The patient is more    interested in discussing any further interventional therapies that might    help his pain.  We discussed Racz catheterization as well as possible    spinal cord stimulator trial.  I had Dr. Stevphen Rochester also discuss these two    procedures with Mr. [name].  The patient is in agreement, and we will    schedule the  patient for a trial of neuroplasty with Racz procedure.  May    consider stimulator trial in the future depending on response to Racz    procedure. 2. Will renew medications to include Duragesic patch 75 mcg 1 patch every    48 hours, #15 with no refills. 3. OxyIR 5 mg 1-2 p.o. q.6h. as needed for breakthrough pain, #120 with no    refills. 4. ______ 20 g 1 p.o. q.d. as needed. 5. The patient was counseled on narcotic usage and his intention for pain    relief and improved function.  The patient understands.  The patient is at    no risk to harm  himself or others. 6. This was comprehensive consultation lasting greater than 25 minutes with    greater than 50% of the time spent discussing procedures and counseling.  The patient was educated on the above findings and recommendations and understands.  There were no barriers to communication. Dictated by:   Sondra Come, D.O. Attending Physician:  Sondra Come DD:  06/28/01 TD:  06/28/01 Job: [id number]
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
## 2011-02-12 NOTE — Consult Note (Signed)
[facility]  Patient:    [name] Visit Number: [id number] MRN: [id number]          Service Type: Attending:  Sondra Come, D.O. Dictated by:   Sondra Come, D.O. Proc. Date: 03/09/02                            Consultation Report  REASON FOR CONSULTATION:  [name] returns to clinic today as scheduled for re-evaluation.  He was last seen on 02/09/02.  He continues to have 7/10 pain on a subjective scale.  He continues to focus on his pain to a significant degree.  He started using the lidoderm patches with some modest improvement in his pain.  It has allowed him to decrease his requirement for Oxy IR.  He brings his pills with him, and has 39 pills remaining on his previous prescription.  He also has started taking intestinal formula #1, and other herbal type medications.  He discusses getting involved in [employer] exercise program this weekend, which according to him, is similar to Tai-Chi to try to improve his exercise tolerance and decrease his pain level.  He basically states that he is getting tired of his pain and wishes that he can do something that helps his pain besides medications.  Ultimately, he wishes to wean himself from the Oxy IR and the Duragesic.  We discussed this at length. I discussed that we would need to do this slowly so that he does not have withdrawal symptoms.  He continues with his psychiatrist, and I encourage him to followup.  I had written a referral for him to see the [facility] psychologist for a trial of hypnotherapy, however, his insurance will not cover this.  REVIEW OF SYSTEMS:  I review the health and history form and 14 point review of systems.  No new neurologic complaints.  PHYSICAL EXAMINATION:  GENERAL:  A healthy male in no acute distress.  VITAL SIGNS:  Blood pressure 138/90, pulse 89, respiratory rate 10, O2 saturation 99% on room air.  NEUROLOGIC:  No new neurologic findings in the  lower extremities, including motor, sensory, and reflexes.  There is still tenderness to palpation over the lumbar paraspinals.  IMPRESSION: 1. Post-laminectomy syndrome. 2. Anxiety disorder. 3. Depression.  PLAN: 1. The patient is encouraged to increase his exercise program as tolerated. 2. Continue Duragesic 75 mcg q.48h. for now #15 without refills.  I instruct    the patient that if he would like to try to get off his medication, the    first thing to do is to change his frequency of patches to q.72h., and then    we would discuss switching him to a 50 mcg patch, 25 mcg patch, and    ultimately discontinue with a tapering dose of oral medication. 3. Continue Oxy IR 5 mg one p.o. b.i.d. p.r.n. breakthrough pain #30 without    refills.  The patient can add this to his remaining 39 pills. 4. Continue lidoderm patches. 5. Continue crystalose and intestinal formula #1 for bowel regimen. 6. The patient is to return to clinic in one month for re-evaluation.  The patient was educated on the above findings and recommendations and understands.  There were no barriers to communication. Dictated by:   Sondra Come, D.O. Attending:  Sondra Come, D.O. DD:  03/09/02 TD:  03/12/02 Job: [id number]

## 2011-02-12 NOTE — Consult Note (Signed)
[facility]  Patient:    DEMARQUIS, OSLEY Visit Number: [id number] MRN: [id number]          Service Type: PMG Location: [facility] Attending Physician:  Sondra Come Dictated by:   Sondra Come, D.O. Proc. Date: 02/09/02 Admit Date:  01/04/2002                            Consultation Report  Mr. Lafitte returns to clinic today as scheduled for reevaluation.  Last seen on January 10, 2002.  No significant changes in the interim, although patient states that he has had intermittent pain radiating from the right side of his back into his right lower extremity.  He describes this as an electrical jolt feeling lasting approximately one minute.  He states this happens about two to three times per month.  Also, he notes increased worrying and worsening of depression.  He has a psychiatrist, Dr. Gaynell Face, with whom he has a follow-up appointment in June.  He wishes to move this up.  His pain today is a 5/10 on a subjective scale.  He has been stable in terms of function and quality of life indexes.  Again, we discuss further treatment options.  He currently takes Duragesic 75 mcg q.48h. with OxyIR 5 mg t.i.d. as needed for breakthrough pain and has 12 pills remaining.  He is no longer following with Dr. Dellia Cloud as he feels he has plateaued in that regard.  We discussed possibly giving consideration to hypnotherapy in an effort to assist with overall pain control.  Patient has some significant financial concerns which seem to make his anxiety worse which I feel is contributing to his overall pain perception.  He continues to perform a home based exercise program and I encourage him to continue.  PHYSICAL EXAMINATION  GENERAL:  Healthy male in no acute distress.  VITAL SIGNS:  Blood pressure 138/85, pulse 85, respirations 16, O2 saturation 100% on room air.  NEUROLOGIC:  Manual muscle testing is 5/5 bilateral lower extremities. Sensory examination is  intact to light touch bilateral lower extremities. Muscle stretch reflexes are 2+/4 bilateral patellar, medial hamstrings, and Achilles.  EXTREMITIES:  No heat, erythema, or edema in the lower extremities.  BACK:  There is tenderness to palpation lumbar paraspinals.  IMPRESSION: 1. Post laminectomy syndrome. 2. Anxiety disorder. 3. Depression.  PLAN: 1. This was an extensive consultation with Mr. Gerhold and his wife.  I    answered multiple questions.  Greater than 25 minutes duration. 2. Continue Duragesic 75 mcg q.48h. for now #15 without refills. 3. Continue OxyIR 5 mg one p.o. t.i.d. p.r.n. breakthrough pain #90 without    refills. 4. Will refer patient to Dr. Moise Boring for trial of hypnotherapy. 5. Patient to get back in to see his psychiatrist to discuss further    management of his anxiety and depression which I feel is a significant    contributing factor in terms of overall pain perception. 6. Patient to continue home based exercise program. 7. Continue Lidoderm patches. 8. Discontinue Baclofen. 9. Patient to return to clinic in one month for reevaluation.  Patient was educated on the above findings and recommendations and understands.  No barriers to communication. Dictated by:   Sondra Come, D.O. Attending Physician:  Sondra Come DD:  02/09/02 TD:  02/12/02 Job: [id number]

## 2011-02-12 NOTE — Consult Note (Signed)
[facility]  Patient:    [name], [name] Visit Number: [id number] MRN: [id number]          Service Type: PMG Location: [facility] Attending Physician:  Rolly Salter Dictated by:   Sondra Come, D.O. Proc. Date: 12/06/01 Admit Date:  09/29/2001                            Consultation Report  Mr. Dhawan returns to clinic today as scheduled for reevaluation.  He continues to complain of severe low back pain with pain radiating into bilateral lower extremities on occasion.  His symptoms essentially have not changed.  His function and quality of life indexes remain somewhat declined. His sleep is fair to good with trazodone 50 mg at bedtime.  He continues using Duragesic 75 mcg q.48h. with OxyIR 5 mg for breakthrough pain t.i.d. p.r.n. The patient also continues to use Valium 5 mg five pills per day for anxiety. He continues to have bowel movements with the use of kristalose 20 mg daily. Mr. Diesel continues to see Dr. Dellia Cloud for behavioral health psychology. He continues a walking program, but would like to get back into aquatic therapy.  He states that he would like to get some type of control over his pain and states that currently the pain seems to be controlling him.  We discussed this at length.  We discussed medications at length.  This was an extensive consultation.  He is accompanied by his wife.  He has financial concerns and this puts some restraints on medications that we could potentially use to assist in his treatment.  We will look into patient assistance programs.  I reviewed health and history form and 14 point review of systems.  PHYSICAL EXAMINATION  GENERAL:  Healthy male in no acute distress.  Mood and affect are appropriate.  VITAL SIGNS:  Blood pressure 120/76, pulse 91, respirations 16, O2 saturation 98% on room air.  BACK:  Decreased lumbar lordosis with midline vertical incisional scar.  There is slight listing to the  left.  There is tenderness to palpation bilateral lumbosacral paraspinous region.  NEUROLOGIC:  Manual muscle testing is 5/5 bilateral lower extremities. Sensory examination is intact to light touch bilateral lower extremities at this time.  Muscle stretch reflexes are 2+/4 bilateral patellar, medial hamstrings, and Achilles.  Patient has tight hamstrings bilaterally.  IMPRESSION: 1. Postlaminectomy syndrome. 2. Anxiety, which I feel is contributing to patients pain perception.  PLAN: 1. Again, I had a long discussion with Mr. Summer and his wife regarding    medication options at this point.  This was an extensive consultation    greater than 25 minutes in duration.  We discussed non-narcotic adjunctive    medications to help with his pain management.  Currently, he is primarily    taking narcotic analgesics as the sole form of pain management in addition    to trazodone 50 mg at bedtime for sleep and Valium for anxiety. 2. Will continue Duragesic 75 mcg one q.48h. #15 without refills. 3. Continue OxyIR 5 mg one p.o. t.i.d. p.r.n. breakthrough pain #90 without    refills to be filled after December 11, 2001. 4. In terms of non-narcotic adjunctive therapy, I will prescribe Humibid DM    one p.o. b.i.d. #60 with one refill for the NMDA antagonistic effect of    dextromethorphan. 5. Will prescribe gabitril 4 mg one p.o. t.i.d. #90 with one refill.  I  have    given patient 20 sample pills to be taken one p.o. q.h.s. x3 days, then    b.i.d. x3 days, then t.i.d.  Patient does express some concern regarding    finances and not being able to pay for this medication.  Again, we will    look into patient assistance programs. 6. Will prescribe Lidoderm 5% patch apply one to two to his back up to 12    hours q.d. #30 with one refill.  I have given patient samples of four    patches.  Again, financial concerns may preclude him obtaining this    medication. 7. Physical therapy for aquatic therapy  for range of motion, stretching, and a    walking program leading to a patient directed home program two to three    times per week for four weeks. 8. I am concerned regarding patients possible inability to pay for some of    the adjunctive non-narcotic medications because I think that he will    benefit from rational polypharmacy in regards to treating his pain.  I do    not feel that narcotic analgesics are the only answer to his pain. 9. Patient to return to clinic in one month for reevaluation.  The patient was educated on the above findings and recommendations and understands.  There were no barriers to communication. Dictated by:   Sondra Come, D.O. Attending Physician:  Rolly Salter DD:  12/06/01 TD:  12/07/01 Job: [id number]

## 2011-02-12 NOTE — Consult Note (Signed)
[facility]  Patient:    [name], [name] Visit Number: [id number] MRN: [id number]          Service Type: PMG Location: [facility] Attending Physician:  Sondra Come Dictated by:   Jewel Baize Stevphen Rochester, M.D. Admit Date:  05/31/2001                            Consultation Report  REASON FOR CONSULTATION:  [facility]  I evaluate him.  He is accompanied by his wife and mother.  Extensive consultation, answering many questions, face-to-face time in excess of 25 minutes.  1. The patient comes in today, is appropriate to his medication, and brings it    in for count; in fact, he has dropped down on the OxyIR and so I will    decrease him from q.i.d. breakthrough to t.i.d. and hopefully to b.i.d. 2. He is having difficulty with sleep.  He has started an activity level that    I think is going to enhance his restorative sleep capacity, but he is still    having difficulty with actually falling asleep.  It does not sound like we    need a sleep study right now, he does not have any nighttime awakening, but    perhaps these medications need to be adjusted. 3. We will go ahead and see him in three to four weeks.  I just do not think    we need to move ahead with a neuroplasty at this time.  They have multiple    questions, and he is actually starting to improve, so we will hold off    until we see some decline in functional indices and quality-of-life    indices.  OBJECTIVE:  Objectively, no significant change in neurological or musculoskeletal presentation.  IMPRESSION: 1. Degenerative spinal disease, lumbar spine. 2. Post-laminectomy syndrome.  PLAN:  Plan as outlined above.  Extensive consultation -- review of chart, review medications, review of patient care agreement. Dictated by:   Jewel Baize Stevphen Rochester, M.D. Attending Physician:  Sondra Come DD:  08/01/01 TD:  08/02/01 Job: [phone]

## 2011-02-12 NOTE — Consult Note (Signed)
[facility]  Patient:    AMRAM, MAYA Visit Number: [id number] MRN: [id number]          Service Type: PMG Location: [facility] Attending Physician:  Rolly Salter Dictated by:   Jewel Baize Stevphen Rochester, M.D. Proc. Date: 08/29/01 Admit Date:  09/29/2001                            Consultation Report  REASON FOR CONSULTATION:  Consepcion Hearing [facility].  I evaluate him with reviewing health and history form and 14 point review of systems.  Chaperoned visit.  I spent a considerable period of time with both he and his wife.  He is doing well on his current medication regimen and no changes will be made.  He does occasionally need a q.i.d. Oxy IR, and this is fine, but I would really like to maintain him on a t.i.d. at most instances.  He is performing physical therapy, and sometimes has some breakthrough pain.  Lifestyle enhancements discussed.  States no wish to harm self or others.  Seems to be doing well from an overall functional standpoint.  Objectively, no significant change in neurological or musculoskeletal presentation.  IMPRESSION: 1. Post laminectomy syndrome. 2. Degenerative spinal disease, lumbar spine.  PLAN: 1. Consider for stimulator candidacy.  Would also consider as neuroplasty. 2. Continue conservative management.  Physician medication management.    Continue with the psychologist, and he is doing well in this regard. 3. We will see him in followup in one month.  Extensive consultation as to the risks of these medications, review of these medications, and review patient care agreement.  He is compliant.  He brings his medications in.  We discuss this with him.  See him in followup. Dictated by:   Jewel Baize Stevphen Rochester, M.D. Attending Physician:  Rolly Salter DD:  08/29/01 TD:  08/29/01 Job: [id number]

## 2011-05-03 ENCOUNTER — Telehealth: Payer: Self-pay | Admitting: *Deleted

## 2011-05-03 NOTE — Telephone Encounter (Signed)
Wife called stating pt has sore throat and white patches in back of throat, and wants to be seen today.

## 2011-05-03 NOTE — Telephone Encounter (Signed)
Spoke with wife and okay to work in @ 4 today or tomorrow.  patient  To return call to schedule.

## 2011-07-06 ENCOUNTER — Other Ambulatory Visit: Payer: Self-pay | Admitting: Family Medicine

## 2011-07-06 MED ORDER — KETOCONAZOLE 2 % EX CREA: TOPICAL_CREAM | Freq: Two times a day (BID) | CUTANEOUS | Status: AC

## 2011-07-06 NOTE — Telephone Encounter (Signed)
rx sent into pharmacy

## 2011-07-06 NOTE — Telephone Encounter (Signed)
Pt need new rx ketoconazole for rash. [facility] [phone]

## 2011-07-06 NOTE — Telephone Encounter (Signed)
Located calli i the ketokonizol cream 2%, dispense 60 g directions apply b.i.d. Refills x 2

## 2011-09-02 ENCOUNTER — Other Ambulatory Visit (INDEPENDENT_AMBULATORY_CARE_PROVIDER_SITE_OTHER): Payer: Medicare Other

## 2011-09-02 DIAGNOSIS — Z79899 Other long term (current) drug therapy: Secondary | ICD-10-CM

## 2011-09-02 DIAGNOSIS — E230 Hypopituitarism: Secondary | ICD-10-CM

## 2011-09-02 DIAGNOSIS — Z0000 Encounter for general adult medical examination without abnormal findings: Secondary | ICD-10-CM

## 2011-09-02 DIAGNOSIS — E291 Testicular hypofunction: Secondary | ICD-10-CM

## 2011-09-02 LAB — LIPID PANEL
HDL: 46.3 mg/dL (ref >39.00)
Triglycerides: 105 mg/dL (ref 0.0–149.0)

## 2011-09-02 LAB — CBC WITH DIFFERENTIAL/PLATELET
Eosinophils Relative: 1.1 % (ref 0.0–5.0)
Lymphocytes Relative: 26.2 % (ref 12.0–46.0)
Lymphs Abs: 1.9 10*3/uL (ref 0.7–4.0)
MCHC: 33.6 g/dL (ref 30.0–36.0)
MCV: 90.8 fl (ref 78.0–100.0)
Monocytes Relative: 6.3 % (ref 3.0–12.0)
Neutro Abs: 4.8 10*3/uL (ref 1.4–7.7)
Neutrophils Relative %: 66.1 % (ref 43.0–77.0)
RDW: 13.2 % (ref 11.5–14.6)

## 2011-09-02 LAB — POCT URINALYSIS DIPSTICK
Leukocytes, UA: NEGATIVE
Nitrite, UA: NEGATIVE
Spec Grav, UA: 1.025
pH, UA: 5.5

## 2011-09-02 LAB — BASIC METABOLIC PANEL
BUN: 18 mg/dL (ref 6–23)
CO2: 28 mEq/L (ref 19–32)
Calcium: 9.2 mg/dL (ref 8.4–10.5)
Creatinine, Ser: 1.1 mg/dL (ref 0.4–1.5)
Glucose, Bld: 91 mg/dL (ref 70–99)

## 2011-09-02 LAB — HEPATIC FUNCTION PANEL
AST: 25 U/L (ref 0–37)
Albumin: 4.4 g/dL (ref 3.5–5.2)
Bilirubin, Direct: 0 mg/dL (ref 0.0–0.3)
Total Protein: 7.6 g/dL (ref 6.0–8.3)

## 2011-09-16 ENCOUNTER — Ambulatory Visit (INDEPENDENT_AMBULATORY_CARE_PROVIDER_SITE_OTHER): Payer: Medicare Other | Admitting: Family Medicine

## 2011-09-16 ENCOUNTER — Encounter: Payer: Self-pay | Admitting: Family Medicine

## 2011-09-16 DIAGNOSIS — Z0000 Encounter for general adult medical examination without abnormal findings: Secondary | ICD-10-CM

## 2011-09-16 DIAGNOSIS — E230 Hypopituitarism: Secondary | ICD-10-CM

## 2011-09-16 DIAGNOSIS — Z23 Encounter for immunization: Secondary | ICD-10-CM

## 2011-09-16 DIAGNOSIS — E291 Testicular hypofunction: Secondary | ICD-10-CM

## 2011-09-16 DIAGNOSIS — Z87442 Personal history of urinary calculi: Secondary | ICD-10-CM

## 2011-09-16 DIAGNOSIS — T783XXA Angioneurotic edema, initial encounter: Secondary | ICD-10-CM

## 2011-09-16 DIAGNOSIS — Z136 Encounter for screening for cardiovascular disorders: Secondary | ICD-10-CM

## 2011-09-16 DIAGNOSIS — J309 Allergic rhinitis, unspecified: Secondary | ICD-10-CM

## 2011-09-16 DIAGNOSIS — B009 Herpesviral infection, unspecified: Secondary | ICD-10-CM

## 2011-09-16 DIAGNOSIS — G4700 Insomnia, unspecified: Secondary | ICD-10-CM

## 2011-09-16 MED ORDER — ACYCLOVIR 800 MG PO TABS: 800.0000 mg | ORAL_TABLET | Freq: Two times a day (BID) | ORAL | Status: DC

## 2011-09-16 MED ORDER — EPINEPHRINE 0.3 MG/0.3ML IJ DEVI: 0.3000 mg | Freq: Once | INTRAMUSCULAR | Status: DC

## 2011-09-16 MED ORDER — POTASSIUM CITRATE ER 10 MEQ (1080 MG) PO TBCR: 10.0000 meq | EXTENDED_RELEASE_TABLET | Freq: Four times a day (QID) | ORAL | Status: DC

## 2011-09-16 MED ORDER — FLUTICASONE PROPIONATE 50 MCG/ACT NA SUSP: 2.0000 | Freq: Every day | NASAL | Status: DC

## 2011-09-16 MED ORDER — CLOMIPHENE CITRATE 50 MG PO TABS: 50.0000 mg | ORAL_TABLET | Freq: Every day | ORAL | Status: DC

## 2011-09-16 NOTE — Patient Instructions (Signed)
Continue your current medications.  Follow-up in one year, sooner if any problem 

## 2011-09-16 NOTE — Progress Notes (Signed)
  Subjective:    Patient ID: [name], male    DOB: 1960-11-13, 50 y.o.   MRN: [id number]  HPI [name] is a 50 year old, married male, nonsmoker, who comes in today for general physical examination because of a history of recurrent HSV, pituitary insufficiency, allergic rhinitis, history of urticaria,o, came anemia, sleep dysfunction, and chronic pain.  The sleep dysfunction and chronic pain or treated by a psychiatrist.  He takes acyclovir 1200 mg daily because of recurrent HSV.  He takes clomiphene 50 mg one quarter tablet daily because of a history of pituitary insufficiency.  We gave him an EpiPen because of a history of idiopathic urticaria.  He is a steroid nasal spray for allergic rhinitis.  He also takes potassium supplement 4 times daily.  10 mEq because of hypokalemia of unknown etiology.  Tetanus booster 2002.......... Booster today....... Seasonal flu shot 2012   Review of Systems  Constitutional: Negative.   HENT: Negative.   Eyes: Negative.   Respiratory: Negative.   Cardiovascular: Negative.   Gastrointestinal: Negative.   Genitourinary: Negative.   Musculoskeletal: Negative.   Skin: Negative.   Neurological: Negative.   Hematological: Negative.   Psychiatric/Behavioral: Negative.        Objective:   Physical Exam  Constitutional: He is oriented to person, place, and time. He appears well-developed and well-nourished.  HENT:  Head: Normocephalic and atraumatic.  Right Ear: External ear normal.  Left Ear: External ear normal.  Nose: Nose normal.  Mouth/Throat: Oropharynx is clear and moist.  Eyes: Conjunctivae and EOM are normal. Pupils are equal, round, and reactive to light.  Neck: Normal range of motion. Neck supple. No JVD present. No tracheal deviation present. No thyromegaly present.  Cardiovascular: Normal rate, regular rhythm, normal heart sounds and intact distal pulses.  Exam reveals no gallop and no friction rub.   No murmur  heard. Pulmonary/Chest: Effort normal and breath sounds normal. No stridor. No respiratory distress. He has no wheezes. He has no rales. He exhibits no tenderness.  Abdominal: Soft. Bowel sounds are normal. He exhibits no distension and no mass. There is no tenderness. There is no rebound and no guarding.  Genitourinary: Rectum normal, prostate normal and penis normal. Guaiac negative stool. No penile tenderness.  Musculoskeletal: Normal range of motion. He exhibits no edema and no tenderness.  Lymphadenopathy:    He has no cervical adenopathy.  Neurological: He is alert and oriented to person, place, and time. He has normal reflexes. No cranial nerve deficit. He exhibits normal muscle tone.  Skin: Skin is warm and dry. No rash noted. No erythema. No pallor.  Psychiatric: He has a normal mood and affect. His behavior is normal. Judgment and thought content normal.          Assessment & Plan:  Healthy male.  Allergic rhinitis.  Continue current medication.  History of recurrent HSV.  Continue acyclovir.  Pituitary insufficiency.  Continue clomiphene 50 mg one quarter tab daily.  History of idiopathic urticaria.  Refill EpiPen.  Hypokalemia, etiology unknown.  Refill, potassium.  Chronic pain syndrome, and insomnia, followed by his psychiatrist

## 2012-01-04 ENCOUNTER — Ambulatory Visit
Admission: RE | Admit: 2012-01-04 | Discharge: 2012-01-04 | Disposition: A | Discharge status: Home / Self Care | Payer: Medicare Other | Source: Ambulatory Visit | Attending: Anesthesiology | Admitting: Anesthesiology

## 2012-01-04 ENCOUNTER — Other Ambulatory Visit: Payer: Self-pay | Admitting: Anesthesiology

## 2012-01-04 DIAGNOSIS — M545 Low back pain, unspecified: Secondary | ICD-10-CM

## 2012-09-22 ENCOUNTER — Other Ambulatory Visit: Payer: Self-pay | Admitting: Family Medicine

## 2012-11-02 ENCOUNTER — Encounter (HOSPITAL_COMMUNITY): Payer: Self-pay | Admitting: Emergency Medicine

## 2012-11-02 ENCOUNTER — Emergency Department (INDEPENDENT_AMBULATORY_CARE_PROVIDER_SITE_OTHER): Payer: Medicare Other

## 2012-11-02 ENCOUNTER — Telehealth: Payer: Self-pay | Admitting: Family Medicine

## 2012-11-02 ENCOUNTER — Emergency Department (HOSPITAL_COMMUNITY)
Admission: EM | Admit: 2012-11-02 | Discharge: 2012-11-02 | Disposition: A | Discharge status: Home / Self Care | Payer: Medicare Other | Source: Home / Self Care | Attending: Emergency Medicine | Admitting: Emergency Medicine

## 2012-11-02 DIAGNOSIS — S92919A Unspecified fracture of unspecified toe(s), initial encounter for closed fracture: Secondary | ICD-10-CM

## 2012-11-02 NOTE — ED Provider Notes (Signed)
History     CSN: [id number]  Arrival date & time 11/02/12  1730   First MD Initiated Contact with Patient 11/02/12 1741      Chief Complaint  Patient presents with  . Toe Injury    right foot fourth toe. pt kicked chair. pt had no shoes on    (Consider location/radiation/quality/duration/timing/severity/associated sxs/prior treatment) Patient is a 52 y.o. male presenting with toe pain. The history is provided by the patient. No language interpreter was used.  Toe Pain This is a new problem. The problem occurs constantly. He has tried nothing for the symptoms.  Pt reports he hit a chair with his foot.   Pt complains of pain in his 4th toe  Past Medical History  Diagnosis Date  . Chronic pain   . 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 Kidney stones     Past Surgical History  Procedure Date  . Lumbar disc surgery     x2  . Appendectomy   . Cervical fusion   . Cervical laminectomy     Family History  Problem Relation Age of Onset  . Heart disease Other   . Diabetes Other   . Hyperlipidemia Other     History  Substance Use Topics  . Smoking status: Former Games developer  . Smokeless tobacco: Not on file  . Alcohol Use: Yes     Comment: occasional      Review of Systems  Musculoskeletal: Positive for myalgias and joint swelling.  All other systems reviewed and are negative.    Allergies  Codeine and Ketorolac tromethamine  Home Medications   Current Outpatient Rx  Name  Route  Sig  Dispense  Refill  . ACYCLOVIR 800 MG PO TABS      TAKE 1 TABLET BY MOUTH 2 TIMES DAILY.   200 tablet   0   . DIAZEPAM 10 MG PO TABS   Oral   Take 10 mg by mouth every 6 (six) hours as needed.           Marland Kitchen DICLOFENAC SODIUM 1 % TD GEL   Topical   Apply 1 application topically as needed.           . FENTANYL 100 MCG/HR TD PT72   Transdermal   Place 1 patch onto the skin every 3 (three) days.           . OXYCODONE HCL ER 15 MG PO TB12   Oral   Take 15 mg by mouth every 12  (twelve) hours.           Marland Kitchen POTASSIUM CITRATE ER 10 MEQ (1080 MG) PO TBCR      TAKE 1 TABLET BY MOUTH 4 TIMES DAILY.   400 each   0   . TRAZODONE HCL 100 MG PO TABS   Oral   Take 100 mg by mouth at bedtime.           . ACYCLOVIR 5 % EX OINT   Topical   Apply 1 application topically every 3 (three) hours.           . CETIRIZINE HCL 10 MG PO TABS   Oral   Take 10 mg by mouth daily.           Marland Kitchen CLOMIPHENE CITRATE 50 MG PO TABS   Oral   Take 1 tablet (50 mg total) by mouth daily. 1/4 tab daily   50 tablet   2   . EPINEPHRINE 0.3 MG/0.3ML IJ DEVI  Intramuscular   Inject 0.3 mLs (0.3 mg total) into the muscle once.   2 Device   1   . FLUTICASONE PROPIONATE 50 MCG/ACT NA SUSP   Nasal   Place 2 sprays into the nose daily.   16 g   11     BP 155/100  Pulse 113  Temp 98.2 F (36.8 C) (Oral)  Resp 23  SpO2 97%  Physical Exam  Nursing note and vitals reviewed. Constitutional: He appears well-developed and well-nourished.  Musculoskeletal: He exhibits tenderness.       Swollen tender right 4th toe  Neurological: He is alert.  Skin: Skin is warm.  Psychiatric: He has a normal mood and affect.    ED Course  Procedures (including critical care time)  Labs Reviewed - No data to display No results found.   1. Phalanx fracture, foot       MDM  Post op shoe and buddy tape        Elson Areas, PA 11/02/12 1921  Lonia Skinner [place], [place] 11/02/12 1940

## 2012-11-02 NOTE — ED Notes (Signed)
Waiting discharge papers 

## 2012-11-02 NOTE — ED Provider Notes (Signed)
Medical screening examination/treatment/procedure(s) were performed by non-physician practitioner and as supervising physician I was immediately available for consultation/collaboration.  Madix Blowe, M.D.   Katianna Mcclenney C Joyanne Eddinger, MD 11/02/12 2243 

## 2012-11-02 NOTE — ED Notes (Signed)
Pt states that he injured his fourth toe on the right foot by kicking a chair. Pt states he was not wearing shoes. Incident happened around 4:30 p.m today.   Some slight swelling is noted. Pt came in on crutches unable to put any weight on right foot.  Pt has taken ibuprofen with no relief of pain.

## 2012-11-02 NOTE — Telephone Encounter (Signed)
Patient Information:  Caller Name: [name]  Phone: [phone]  Patient: [name], [name]  Gender: Male  DOB: May 21, 1961  Age: 52 Years  PCP: Kelle Darting [facility])  Office Follow Up:  Does the office need to follow up with this patient?: No  Instructions For The Office: N/A  RN Note:  Patient states he accidentally kicked a chair at approx, 1630 11/22/12. Patient states toe is out of proper alignment. States no laceration or bleeding. Patient states he just took Ibuprofen for pain. Care advice given per guidelines. Patient advised to be evaluated in the urgent Care at [facility]. Patient advised not to drive self. Patient verbalizes understanding and agreeable.  Symptoms  Reason For Call & Symptoms: Toe Injury  Reviewed Health History In EMR: Yes  Reviewed Medications In EMR: Yes  Reviewed Allergies In EMR: Yes  Reviewed Surgeries / Procedures: Yes  Date of Onset of Symptoms: 11/02/2012  Guideline(s) Used:  Toe Injury  Disposition Per Guideline:   Go to ED Now (or to Office with PCP Approval)  Reason For Disposition Reached:   Looks like a broken bone or dislocated joint (e.g., crooked or deformed)  Advice Given:  Apply a Cold Pack:  Apply a cold pack or an ice bag (wrapped in a moist towel) to the area for 20 minutes. Repeat in 1 hour, then every 4 hours while awake.  Elevate the Foot:  When you are sitting down reading or watching TV, place your foot up on a pillow.  This can also help decrease swelling and pain.  Protect the Toe:  Wear a comfortable pair of shoes that do not rub on the injured toe.  Call Back If:  You have more questions  RN Overrode Recommendation:  Go To [facility]  Nursing judgement, patient requiring Xray, office is near closing time.

## 2012-11-11 ENCOUNTER — Other Ambulatory Visit: Payer: Self-pay

## 2012-12-18 ENCOUNTER — Other Ambulatory Visit (INDEPENDENT_AMBULATORY_CARE_PROVIDER_SITE_OTHER): Payer: Medicare Other

## 2012-12-18 DIAGNOSIS — Z0000 Encounter for general adult medical examination without abnormal findings: Secondary | ICD-10-CM

## 2012-12-18 LAB — CBC WITH DIFFERENTIAL/PLATELET
Eosinophils Relative: 1.5 % (ref 0.0–5.0)
HCT: 47.9 % (ref 39.0–52.0)
Hemoglobin: 16.7 g/dL (ref 13.0–17.0)
Lymphs Abs: 1.5 10*3/uL (ref 0.7–4.0)
MCV: 88.5 fl (ref 78.0–100.0)
Monocytes Relative: 6.9 % (ref 3.0–12.0)
Neutro Abs: 4.7 10*3/uL (ref 1.4–7.7)
WBC: 6.8 10*3/uL (ref 4.5–10.5)

## 2012-12-18 LAB — BASIC METABOLIC PANEL
Chloride: 99 mEq/L (ref 96–112)
GFR: 76.74 mL/min (ref >60.00)
Glucose, Bld: 112 mg/dL — ABNORMAL HIGH (ref 70–99)
Potassium: 4.7 mEq/L (ref 3.5–5.1)
Sodium: 136 mEq/L (ref 135–145)

## 2012-12-18 LAB — POCT URINALYSIS DIPSTICK
Blood, UA: NEGATIVE
Protein, UA: NEGATIVE
Spec Grav, UA: 1.025
Urobilinogen, UA: 0.2
pH, UA: 6

## 2012-12-18 LAB — HEPATIC FUNCTION PANEL
ALT: 40 U/L (ref 0–53)
AST: 30 U/L (ref 0–37)
Albumin: 4.2 g/dL (ref 3.5–5.2)
Total Protein: 7 g/dL (ref 6.0–8.3)

## 2012-12-18 LAB — TSH: TSH: 0.73 u[IU]/mL (ref 0.35–5.50)

## 2012-12-18 LAB — LIPID PANEL
Total CHOL/HDL Ratio: 5
Triglycerides: 135 mg/dL (ref 0.0–149.0)

## 2012-12-18 LAB — TESTOSTERONE: Testosterone: 260.02 ng/dL — ABNORMAL LOW (ref 350.00–890.00)

## 2012-12-20 ENCOUNTER — Ambulatory Visit
Admission: RE | Admit: 2012-12-20 | Discharge: 2012-12-20 | Disposition: A | Discharge status: Home / Self Care | Payer: Medicare Other | Source: Ambulatory Visit | Attending: Anesthesiology | Admitting: Anesthesiology

## 2012-12-20 ENCOUNTER — Other Ambulatory Visit: Payer: Self-pay | Admitting: Anesthesiology

## 2012-12-20 DIAGNOSIS — M79671 Pain in right foot: Secondary | ICD-10-CM

## 2012-12-25 ENCOUNTER — Encounter: Payer: Medicare Other | Admitting: Family Medicine

## 2013-01-01 ENCOUNTER — Ambulatory Visit (INDEPENDENT_AMBULATORY_CARE_PROVIDER_SITE_OTHER): Payer: Medicare Other | Admitting: Family Medicine

## 2013-01-01 ENCOUNTER — Encounter: Payer: Self-pay | Admitting: Family Medicine

## 2013-01-01 VITALS — BP 140/90 | Temp 98.5°F | Ht 68.75 in | Wt 164.0 lb

## 2013-01-01 DIAGNOSIS — Z0000 Encounter for general adult medical examination without abnormal findings: Secondary | ICD-10-CM

## 2013-01-01 DIAGNOSIS — L259 Unspecified contact dermatitis, unspecified cause: Secondary | ICD-10-CM

## 2013-01-01 DIAGNOSIS — N200 Calculus of kidney: Secondary | ICD-10-CM

## 2013-01-01 DIAGNOSIS — J309 Allergic rhinitis, unspecified: Secondary | ICD-10-CM

## 2013-01-01 DIAGNOSIS — G4700 Insomnia, unspecified: Secondary | ICD-10-CM

## 2013-01-01 DIAGNOSIS — B009 Herpesviral infection, unspecified: Secondary | ICD-10-CM

## 2013-01-01 DIAGNOSIS — G894 Chronic pain syndrome: Secondary | ICD-10-CM

## 2013-01-01 DIAGNOSIS — Z8619 Personal history of other infectious and parasitic diseases: Secondary | ICD-10-CM

## 2013-01-01 DIAGNOSIS — T783XXA Angioneurotic edema, initial encounter: Secondary | ICD-10-CM

## 2013-01-01 MED ORDER — FLUTICASONE PROPIONATE 50 MCG/ACT NA SUSP: 2.0000 | Freq: Every day | NASAL | Status: DC

## 2013-01-01 MED ORDER — ACYCLOVIR 800 MG PO TABS: ORAL_TABLET | ORAL | Status: DC

## 2013-01-01 MED ORDER — POTASSIUM CITRATE ER 10 MEQ (1080 MG) PO TBCR: 20.0000 meq | EXTENDED_RELEASE_TABLET | ORAL | Status: DC

## 2013-01-01 MED ORDER — EPINEPHRINE 0.3 MG/0.3ML IJ DEVI: 0.3000 mg | Freq: Once | INTRAMUSCULAR | Status: DC

## 2013-01-01 MED ORDER — ACYCLOVIR 5 % EX OINT: 1.0000 "application " | TOPICAL_OINTMENT | CUTANEOUS | Status: DC

## 2013-01-01 NOTE — Progress Notes (Signed)
  Subjective:    Patient ID: [name], male    DOB: [birth year], 52 y.o.   MRN: [id number]  HPI [name] is a delightful 52 year old male nonsmoker who comes in today for general physical examination because of a history of chronic pain syndrome, allergic rhinitis, contact dermatitis, sleep dysfunction, and edema and idiopathic edema unknown etiology  He states he feels well and has had no complaints. He's working with his pain physician Dr. Vear Clock to decrease his medication. The rest of his medications are unchanged.  He gets routine eye care, dental care, colonoscopy at age 73 normal  Tetanus booster 2012 seasonal flu shot 2013  He states over the weekend he developed the sudden loss of hearing in his left ear. He thinks about 60% less than normal. No history of trauma   Review of Systems  Constitutional: Negative.   HENT: Negative.   Eyes: Negative.   Respiratory: Negative.   Cardiovascular: Negative.   Gastrointestinal: Negative.   Genitourinary: Negative.   Musculoskeletal: Negative.   Skin: Negative.   Neurological: Negative.   Psychiatric/Behavioral: Negative.        Objective:   Physical Exam  Nursing note and vitals reviewed. Constitutional: He is oriented to person, place, and time. He appears well-developed and well-nourished.  HENT:  Head: Normocephalic and atraumatic.  Right Ear: External ear normal.  Left Ear: External ear normal.  Nose: Nose normal.  Mouth/Throat: Oropharynx is clear and moist.  Eyes: Conjunctivae and EOM are normal. Pupils are equal, round, and reactive to light.  Neck: Normal range of motion. Neck supple. No JVD present. No tracheal deviation present. No thyromegaly present.  Cardiovascular: Normal rate, regular rhythm, normal heart sounds and intact distal pulses.  Exam reveals no gallop and no friction rub.   No murmur heard. Pulmonary/Chest: Effort normal and breath sounds normal. No stridor. No respiratory distress. He has no  wheezes. He has no rales. He exhibits no tenderness.  Abdominal: Soft. Bowel sounds are normal. He exhibits no distension and no mass. There is no tenderness. There is no rebound and no guarding.  Genitourinary: Rectum normal, prostate normal and penis normal. Guaiac negative stool. No penile tenderness.  Musculoskeletal: Normal range of motion. He exhibits no edema and no tenderness.  Lymphadenopathy:    He has no cervical adenopathy.  Neurological: He is alert and oriented to person, place, and time. He has normal reflexes. No cranial nerve deficit. He exhibits normal muscle tone.  Skin: Skin is warm and dry. No rash noted. No erythema. No pallor.  Total body skin exam normal except for scar in his lumbar spine and cervical spine from previous  Psychiatric: He has a normal mood and affect. His behavior is normal. Judgment and thought content normal.          Assessment & Plan:  Healthy male  Chronic pain continue followup by Dr. Thyra Breed  Insomnia continue followup by Dr. Gaynell Face  New onset hearing loss left ear consult with Dr. Narda Bonds ASAP  Allergic rhinitis continue current therapy  Contact dermatitis continue current therapy  Angioneurotic edema unknown etiology refill EpiPen

## 2013-01-01 NOTE — Patient Instructions (Signed)
Continue current medications  Call Dr. Dillard Cannon ASAP and asked to be seen ASAP for evaluation of hearing loss in your left ear......... [phone]  Return in one year sooner if any problems

## 2013-01-02 LAB — HEPATITIS C ANTIBODY: HCV Ab: REACTIVE — AB

## 2013-01-04 LAB — HEPATITIS C RNA QUANTITATIVE: HCV Quantitative: NOT DETECTED IU/mL (ref <15)

## 2013-07-31 ENCOUNTER — Telehealth: Payer: Self-pay | Admitting: Family Medicine

## 2013-07-31 NOTE — Telephone Encounter (Signed)
Pt requesting refill of Proctofoam states he was previously taking but needs a new script.  He has a test tomorrow and needs ASAP.  Pharmacy [facility]

## 2013-07-31 NOTE — Telephone Encounter (Signed)
Okay to refill ProctoFoam

## 2013-08-01 MED ORDER — PRAMOXINE HCL 1 % RE FOAM: 1.0000 "application " | Freq: Three times a day (TID) | RECTAL | Status: DC | PRN

## 2013-08-02 ENCOUNTER — Other Ambulatory Visit: Payer: Self-pay

## 2013-08-02 ENCOUNTER — Telehealth: Payer: Self-pay | Admitting: Family Medicine

## 2013-08-02 MED ORDER — HYDROCORTISONE ACE-PRAMOXINE 1-1 % RE FOAM: 1.0000 | Freq: Two times a day (BID) | RECTAL | Status: DC

## 2013-08-02 NOTE — Telephone Encounter (Signed)
Refills sent to pharmacy. 

## 2013-08-02 NOTE — Telephone Encounter (Signed)
[facility] Triage Call Report Triage Record Num: [id number] Operator: Lesli Albee Patient Name: [name] Call Date & Time: 08/01/2013 5:55:06PM Patient Phone: [phone] PCP: Patient Gender: Male PCP Fax : Patient DOB: 05-03-1961 Practice Name: [facility] Reason for Call: Caller: [place]/Patient; PCP: Kelle Darting (Family Practice); CB#: [phone]; Call regarding Medication Issue; Medication(s): Proctofoam HC; Pt is calling to say he got the plain Proctofoam which will not help him. He needs the Proactofoam [facility] RN checked [facility] and did not see a way to change the order without contacting MD on call. The last order was from 2010. Pt insistent he have it tonight. Rn called Dr. Darrick Huntsman who ordered Proctofoam with [facility] to be applied rectally TID prn rectal pain and itching. RN called in 1 tube with 1 refill to [facility] [phone] Protocol(s) Used: Medication Questions - Adult Recommended Outcome per Protocol: Speak with Provider or Pharmacist within 24 hours Reason for Outcome: Requests refill of prescribed medication with valid refills; lack of medications does not put patient at clinical risk Care Advice: ~

## 2013-08-03 ENCOUNTER — Telehealth: Payer: Self-pay | Admitting: Family Medicine

## 2013-08-03 NOTE — Telephone Encounter (Signed)
Patient Information:  Caller Name: [name]  Phone: [phone]  Patient: [name], [name]  Gender: Male  DOB: 1960/12/05  Age: 52 Years  PCP: Kelle Darting [facility])  Office Follow Up:  Does the office need to follow up with this patient?: Yes  Instructions For The Office: Please contact patient. Requesting guidance . Has never bled 5 days from Hemorrhoid. Bleeding has decreased but concerned  RN Note:  Bleeding is decreasing but never lasted 5 days. patient is concerned and wants guidance by Dr. Tawanna Cooler. Please contact patient.  Symptoms  Reason For Call & Symptoms: Patient states he has a hemorrhoid.  He has been using hemorrhoid medication  Protcofoam  and it was not working so he contacted the office on Wednesday 08/01/13 adnd got Protoofoam HC.  called in by Dr. Tawanna Cooler.  He has been using Protcofoam /Proctofoam HC since Monday. He states he is some better. He states < 1 tsp a day of blood. Awakes in the morning with blood in underpants. some blood in toliet with BM  Reviewed Health History In EMR: Yes  Reviewed Medications In EMR: Yes  Reviewed Allergies In EMR: Yes  Reviewed Surgeries / Procedures: Yes  Date of Onset of Symptoms: 07/30/2013  Treatments Tried: Proctofoam HC  Treatments Tried Worked: No  Guideline(s) Used:  Rectal Bleeding  Rectal Symptoms  Disposition Per Guideline:   See Today in Office  Reason For Disposition Reached:   All other patients with rectal bleeding (Exceptions: blood just on toilet paper, few drops, streaks on surface of normal formed BM)  Advice Given:  Warm SITZ Baths Twice a Day:   Sit in a warm saline bath for 20 minutes 2 times daily to cleanse the rectal area and to promote healing.  You can add 2 ounces (57 grams) of table salt or baking soda to each tub of water.  General Instructions for Treating and Preventing Constipation:   Eat a high fiber diet.  Drink adequate liquids.  Don't ignore your body's signals to have a BM.  High  Fiber Diet:  Try to eat fresh fruit and vegetables at each meal (peas, prunes, citrus, apples, beans, corn).  Eat more grain foods (bran flakes, bran muffins, graham crackers, oatmeal, brown rice, and whole wheat bread).  Treatment of Rectal Itching and Irritation:  The main treatment for rectal itching is keeping the rectal area clean and dry, and avoiding excessive rubbing or scratching. Loose cotton underwear helps keep the area dry.  Cleansing after a Bowel Movement - Cleanse the anus with warm water after each bowel movement. Use wet cotton or tissue. Pat the area dry using unscented toilet paper. Avoid rubbing area with toilet paper.  Call Back If:  Bleeding increases in amount  Bleeding occurs 3 or more times after treatment begins  You become worse.  Patient Will Follow Care Advice:  YES

## 2013-08-03 NOTE — Telephone Encounter (Signed)
Attempted to reach patient without success.  Identified voice mail left.

## 2013-08-03 NOTE — Telephone Encounter (Signed)
Called and spoke with pt and advised that per Dr. Kirtland Bouchard pt should be seen at [facility] or [facility].  Pt states he will see how it goes overnight and will be seen if needed on Saturday.

## 2013-09-05 ENCOUNTER — Other Ambulatory Visit: Payer: Self-pay | Admitting: Family Medicine

## 2013-10-25 ENCOUNTER — Encounter: Payer: Self-pay | Admitting: Family Medicine

## 2013-10-25 ENCOUNTER — Ambulatory Visit (INDEPENDENT_AMBULATORY_CARE_PROVIDER_SITE_OTHER): Payer: Medicare Other | Admitting: Family Medicine

## 2013-10-25 VITALS — BP 150/110 | Temp 98.3°F | Wt 169.0 lb

## 2013-10-25 DIAGNOSIS — I10 Essential (primary) hypertension: Secondary | ICD-10-CM | POA: Insufficient documentation

## 2013-10-25 MED ORDER — LISINOPRIL 10 MG PO TABS: 10.0000 mg | ORAL_TABLET | Freq: Every day | ORAL | Status: DC

## 2013-10-25 NOTE — Progress Notes (Signed)
   Subjective:    Patient ID: [name], male    DOB: [birth year], 53 y.o.   MRN: [id number]  HPI Marquin is a 53 year old male nonsmoker who comes in today for evaluation of elevated blood pressure  He's been monitoring his blood pressure last 6 months because his father and sister have high blood pressure. His blood pressures in the 150/100 range consistently.   Review of Systems    review of systems negative Objective:   Physical Exam Well-developed well-nourished male in no acute distress vital signs stable he is afebrile BP right arm sitting position 150/110 pulse 70 and regular       Assessment & Plan:  New-onset hypertension,,,,,,,,, begin ACE inhibitor followup as outlined

## 2013-10-25 NOTE — Patient Instructions (Signed)
Lisinopril 10 mg.......... one tablet daily in the morning  Avoid salt  Walk 15 minutes daily  Check your blood pressure daily in the morning,,,,,,,,,,,omron digital pump up blood pressure cuff seems to be the most accurate device  Return in 2 weeks for followup,,,,,,,,,,,,, bring a copy of all your blood pressure readings and the device

## 2013-10-26 ENCOUNTER — Telehealth: Payer: Self-pay | Admitting: Family Medicine

## 2013-10-26 NOTE — Telephone Encounter (Signed)
Relevant patient education assigned to patient using Emmi. ° °

## 2013-10-30 ENCOUNTER — Telehealth: Payer: Self-pay | Admitting: Family Medicine

## 2013-10-30 NOTE — Telephone Encounter (Signed)
Patient Information:  Caller Name: [name]  Phone: [phone]  Patient: [name], [name]  Gender: Male  DOB: [birth year]  Age: 53 Years  PCP: Stevie Kern [facility])  Office Follow Up:  Does the office need to follow up with this patient?: Yes  Instructions For The Office: Pt requests alternate BP med as Lisinopril causes frequent cough.   Symptoms  Reason For Call & Symptoms: Patient calling.  Reports he started new Rx  Lisinopril on 10/27/13 and was told to call for cough.  Cough progressively worse.  Reviewed Health History In EMR: Yes  Reviewed Medications In EMR: Yes  Reviewed Allergies In EMR: Yes  Reviewed Surgeries / Procedures: Yes  Date of Onset of Symptoms: 10/30/2013  Guideline(s) Used:  Cough  Disposition Per Guideline:   See Within 3 Days in Office  Reason For Disposition Reached:   Taking an ACE Inhibitor medication (e.g., benazepril/LOTENSIN, captopril/CAPOTEN, enalapril/VASOTEC, lisinopril/ZESTRIL)  Advice Given:  Cough Medicines:  Home Remedy - Hard Candy: Hard candy works just as well as medicine-flavored OTC cough drops. Diabetics should use sugar-free candy.  Prevent Dehydration:  Drink adequate liquids.  Call Back If:  You become worse.  Patient Refused Recommendation:  Patient Refused Care Advice  Patitne requests alternate med for BP; reports most recent reading 134/92 pulse 81 at 17:00 on 10/29/13.

## 2013-10-31 ENCOUNTER — Other Ambulatory Visit: Payer: Self-pay | Admitting: Family Medicine

## 2013-10-31 ENCOUNTER — Ambulatory Visit: Payer: Self-pay | Admitting: Family Medicine

## 2013-10-31 ENCOUNTER — Ambulatory Visit (INDEPENDENT_AMBULATORY_CARE_PROVIDER_SITE_OTHER): Payer: Medicare Other | Admitting: Family Medicine

## 2013-10-31 ENCOUNTER — Encounter: Payer: Self-pay | Admitting: Family Medicine

## 2013-10-31 VITALS — BP 150/108 | Temp 98.0°F | Wt 170.0 lb

## 2013-10-31 DIAGNOSIS — R058 Other specified cough: Secondary | ICD-10-CM

## 2013-10-31 DIAGNOSIS — R05 Cough: Secondary | ICD-10-CM

## 2013-10-31 DIAGNOSIS — T465X5A Adverse effect of other antihypertensive drugs, initial encounter: Secondary | ICD-10-CM

## 2013-10-31 DIAGNOSIS — T464X5A Adverse effect of angiotensin-converting-enzyme inhibitors, initial encounter: Secondary | ICD-10-CM | POA: Insufficient documentation

## 2013-10-31 DIAGNOSIS — I10 Essential (primary) hypertension: Secondary | ICD-10-CM

## 2013-10-31 DIAGNOSIS — R059 Cough, unspecified: Secondary | ICD-10-CM

## 2013-10-31 MED ORDER — LOSARTAN POTASSIUM 50 MG PO TABS: 50.0000 mg | ORAL_TABLET | Freq: Every day | ORAL | Status: DC

## 2013-10-31 NOTE — Patient Instructions (Signed)
Stop the lisinopril  Begin Cozaar 50 mg............ one tablet daily in the morning  Check your blood pressure daily in the morning  Return in 4 weeks for followup with a record of all your blood pressure readings and the device

## 2013-10-31 NOTE — Telephone Encounter (Signed)
Patient Information:  Caller Name: [name]  Phone: [phone]  Patient: [name], [name]  Gender: Male  DOB: Jul 19, 1961  Age: 53 Years  PCP: Stevie Kern [facility])  Office Follow Up:  Does the office need to follow up with this patient?: No  Instructions For The Office: N/A  RN Note:  Patient is calling regarding cough with  blood pressure medicine.  States "I called yesterday, and have not heard anything.  I'd like to be seen today regarding this."  Symptoms  Reason For Call & Symptoms: c/o cough with blood pressure medicine.  Reviewed Health History In EMR: Yes  Reviewed Medications In EMR: Yes  Reviewed Allergies In EMR: Yes  Reviewed Surgeries / Procedures: Yes  Date of Onset of Symptoms: 10/29/2013  Guideline(s) Used:  Cough  Disposition Per Guideline:   See Within 3 Days in Office  Reason For Disposition Reached:   Taking an ACE Inhibitor medication (e.g., benazepril/LOTENSIN, captopril/CAPOTEN, enalapril/VASOTEC, lisinopril/ZESTRIL)  Advice Given:  N/A  RN Overrode Recommendation:  Make Appointment  Patient requesting to be seen by Dr. Sarajane Jews today  Appointment Scheduled:  10/31/2013 11:30:38 Appointment Scheduled Provider:  Alysia Penna [facility])

## 2013-10-31 NOTE — Progress Notes (Signed)
   Subjective:    Patient ID: [name], male    DOB: April 21, 1961, 53 y.o.   MRN: [id number]  HPI  [name] is a 53 year old male who comes in today for reevaluation of hypertension and a cough  We saw him a couple weeks ago with new-onset hypertension start him on lisinopril 10 mg daily. We 1 him about the side effects of hives and cough. He denied get hives but he developed a cough and the cough is gone worse. Nares has soreness in his chest from coughing Elavil  Review of Systems    review of systems negative Objective:   Physical Exam  Well-developed well-nourished male no acute distress vital signs stable he is afebrile cardiopulmonary exam normal BP 150/108      Assessment & Plan:  Cough secondary to ACE inhibitor plan DC ACE inhibitor start Cozaar

## 2013-11-08 ENCOUNTER — Telehealth: Payer: Self-pay | Admitting: Family Medicine

## 2013-11-08 ENCOUNTER — Ambulatory Visit: Payer: Medicare Other | Admitting: Family Medicine

## 2013-11-08 NOTE — Telephone Encounter (Signed)
Patient Information:  Caller Name: Reino  Phone: [phone]  Patient: [name], [name]  Gender: Male  DOB: 12-15-1960  Age: 53 Years  PCP: Stevie Kern [facility])  Office Follow Up:  Does the office need to follow up with this patient?: No  Instructions For The Office: N/A  RN Note:  Has not checked BP. Began Cozaar 10/31/13; cough began 11/06/13.  Stop using decongestant with hypertension. Try nasal saline for nasal stuffiness palin Guaifenesin during day and DM at night.  Symptoms  Reason For Call & Symptoms: Productive cough with frontal headache.  Reviewed Health History In EMR: Yes  Reviewed Medications In EMR: Yes  Reviewed Allergies In EMR: Yes  Reviewed Surgeries / Procedures: Yes  Date of Onset of Symptoms: 11/06/2013  Treatments Tried: Flonase, Mucinex D  Treatments Tried Worked: Yes  Guideline(s) Used:  Cough  Disposition Per Guideline:   Home Care  Reason For Disposition Reached:   Cough with cold symptoms (e.g., runny nose, postnasal drip, throat clearing)  Advice Given:  Reassurance  Coughing is the way that our lungs remove irritants and mucus. It helps protect our lungs from getting pneumonia.  You can get a dry hacking cough after a chest cold. Sometimes this type of cough can last 1-3 weeks, and be worse at night.  You can also get a cough after being exposed to irritating substances like smoke, strong perfumes, and dust.  OTC Cough Syrup - Dextromethorphan:  Cough syrups containing the cough suppressant dextromethorphan (DM) may help decrease your cough. Cough syrups work best for coughs that keep you awake at night. They can also sometimes help in the late stages of a respiratory infection when the cough is dry and hacking. They can be used along with cough drops.  Examples: Benylin, Robitussin DM, Vicks 44 Cough Relief  Read the package instructions for dosage, contraindications, and other important information.  Caution -  Dextromethorphan:   Do not try to completely suppress coughs that produce mucus and phlegm. Remember that coughing is helpful in bringing up mucus from the lungs and preventing pneumonia.  Research Notes: Dextromethorphan in some research studies has been shown to reduce the frequency and severity of cough in adults (18 years or older) without significant adverse effects. However, other studies suggest that dextromethorphan is no better than placebo at reducing a cough.  Coughing Spasms:  Drink warm fluids. Inhale warm mist (Reason: both relax the airway and loosen up the phlegm).  Suck on cough drops or hard candy to coat the irritated throat.  Prevent Dehydration:  Drink adequate liquids.  This will help soothe an irritated or dry throat and loosen up the phlegm.  Expected Course:   Viral bronchitis (chest cold) causes a cough that lasts 1 to 3 weeks. Sometimes you may cough up lots of phlegm (sputum, mucus). The mucus can normally be white, gray, yellow, or green.  Call Back If:  Difficulty breathing  Cough lasts more than 3 weeks  Fever lasts > 3 days  You become worse.  Patient Will Follow Care Advice:  YES

## 2013-11-09 ENCOUNTER — Ambulatory Visit (INDEPENDENT_AMBULATORY_CARE_PROVIDER_SITE_OTHER): Payer: Medicare Other | Admitting: Family Medicine

## 2013-11-09 ENCOUNTER — Encounter: Payer: Self-pay | Admitting: Family Medicine

## 2013-11-09 VITALS — BP 114/74 | HR 122 | Temp 98.7°F | Ht 68.75 in | Wt 164.0 lb

## 2013-11-09 DIAGNOSIS — J209 Acute bronchitis, unspecified: Secondary | ICD-10-CM

## 2013-11-09 MED ORDER — METHYLPREDNISOLONE ACETATE 80 MG/ML IJ SUSP
120.0000 mg | Freq: Once | INTRAMUSCULAR | Status: AC
  Administered 2013-11-09: 120 mg via INTRAMUSCULAR

## 2013-11-09 MED ORDER — AZITHROMYCIN 250 MG PO TABS: ORAL_TABLET | ORAL | Status: DC

## 2013-11-09 NOTE — Addendum Note (Signed)
Addended by: Aggie Hacker A on: 11/09/2013 02:18 PM   Modules accepted: Orders

## 2013-11-09 NOTE — Progress Notes (Signed)
   Subjective:    Patient ID: [name], male    DOB: 09/29/1960, 53 y.o.   MRN: [id number]  HPI Here for 3 days of fever to 101.6 degrees, ST, PND, chest tightness and a dry cough. On fluids and Advil.    Review of Systems  Constitutional: Positive for fever.  HENT: Positive for congestion, postnasal drip and sinus pressure.   Eyes: Negative.   Respiratory: Positive for cough and chest tightness.        Objective:   Physical Exam  Constitutional: He appears well-developed and well-nourished.  HENT:  Right Ear: External ear normal.  Left Ear: External ear normal.  Nose: Nose normal.  Mouth/Throat: Oropharynx is clear and moist.  Eyes: Conjunctivae are normal.  Pulmonary/Chest: Effort normal and breath sounds normal.  Lymphadenopathy:    He has no cervical adenopathy.          Assessment & Plan:  Recheck prn

## 2013-11-09 NOTE — Telephone Encounter (Signed)
Patient Information:  Caller Name: Ryoma  Phone: [phone]  Patient: [name], [name]  Gender: Male  DOB: 1961-04-17  Age: 53 Years  PCP: Stevie Kern [facility])  Office Follow Up:  Does the office need to follow up with this patient?: No  Instructions For The Office: N/A  RN Note:  Sore throat is worst symptom but also has worsening cough with eye discomfort with soreness. Requested appointment for self and wife, who is patient of Dr Sarajane Jews.  Dr Sherren Mocha is out of office so both appointments scheduled with Dr Sarajane Jews.  Symptoms  Reason For Call & Symptoms: Sore throat with cough and new onset of fever.  Wife has bronchitis.  Reviewed Health History In EMR: Yes  Reviewed Medications In EMR: Yes  Reviewed Allergies In EMR: Yes  Reviewed Surgeries / Procedures: Yes  Date of Onset of Symptoms: 11/06/2013  Treatments Tried: Flonase, Mucinex, nasal spray, robitussin DM  Treatments Tried Worked: No  Any Fever: Yes  Fever Taken: Oral  Fever Time Of Reading: 08:00:00  Fever Last Reading: 101.6  Guideline(s) Used:  Sore Throat  Disposition Per Guideline:   See Today in Office  Reason For Disposition Reached:   Severe sore throat pain  Advice Given:  For Relief of Sore Throat Pain:  Sip warm chicken broth or apple juice.  Suck on hard candy or a throat lozenge (over-the-counter).  Gargle warm salt water 3 times daily (1 teaspoon of salt in 8 oz or 240 ml of warm water).  Pain Medicines:  Ibuprofen (e.g., Motrin, Advil):  Take 400 mg (two 200 mg pills) by mouth every 6 hours.  Another choice is to take 600 mg (three 200 mg pills) by mouth every 8 hours.  The most you should take each day is 1,200 mg (six 200 mg pills), unless your doctor has told you to take more.  Expected Course:  Sore throats with viral illnesses usually last 3 or 4 days.  Call Back If:  Sore throat is mild but lasts longer than 4 days  Fever lasts longer than 3 days  You become  worse.  Patient Will Follow Care Advice:  YES  Appointment Scheduled:  11/09/2013 13:15:00 Appointment Scheduled Provider:  Alysia Penna [facility])

## 2013-11-12 ENCOUNTER — Telehealth: Payer: Self-pay | Admitting: Family Medicine

## 2013-11-12 NOTE — Telephone Encounter (Signed)
Patient Information:  Caller Name: Chrisopher  Phone: [phone]  Patient: [name], [name]  Gender: Male  DOB: 11/20/1960  Age: 53 Years  PCP: Stevie Kern [facility])  Office Follow Up:  Does the office need to follow up with this patient?: Yes  Instructions For The Office:Needinga another antibiotic? coughing up blood tinged and globs of blood mixed with yellow sputum   Symptoms  Reason For Call & Symptoms: Calling about seen in the office for congestion and Cough on 11/09/13 and prescribed Zpack and given shot of Prednisone in the office. Told to call back if coughs up blood. Today coughing up yellowish mucos with streaks and globs of blood. Wife has Bronchitis and is on second round of antibiotics. Denies chest pain, but still having  coughing spells. No wheezing. Nasal drainage is clear. Afebrile.  Reviewed Health History In EMR: Yes  Reviewed Medications In EMR: Yes  Reviewed Allergies In EMR: Yes  Reviewed Surgeries / Procedures: Yes  Date of Onset of Symptoms: 11/12/2013  Treatments Tried: Robitussin DM  Treatments Tried Worked: No  Guideline(s) Used:  Cough  Disposition Per Guideline:   See Today in Office  Reason For Disposition Reached:   Coughing up rusty-colored (reddish-brown) or blood-tinged sputum  Advice Given:  Reassurance  Coughing is the way that our lungs remove irritants and mucus. It helps protect our lungs from getting pneumonia.  Cough Medicines:  OTC Cough Syrups: The most common cough suppressant in OTC cough medications is dextromethorphan. Often the letters "DM" appear in the name.  OTC Cough Drops: Cough drops can help a lot, especially for mild coughs. They reduce coughing by soothing your irritated throat and removing that tickle sensation in the back of the throat. Cough drops also have the advantage of portability - you can carry them with you.  Home Remedy - Hard Candy: Hard candy works just as well as medicine-flavored OTC cough  drops. Diabetics should use sugar-free candy.  Home Remedy - Honey: This old home remedy has been shown to help decrease coughing at night. The adult dosage is 2 teaspoons (10 ml) at bedtime. Honey should not be given to infants under one year of age.  Coughing Spasms:  Drink warm fluids. Inhale warm mist (Reason: both relax the airway and loosen up the phlegm).  Suck on cough drops or hard candy to coat the irritated throat.  Prevent Dehydration:  Drink adequate liquids.  This will help soothe an irritated or dry throat and loosen up the phlegm.  Expected Course:   The expected course depends on what is causing the cough.  Viral bronchitis (chest cold) causes a cough that lasts 1 to 3 weeks. Sometimes you may cough up lots of phlegm (sputum, mucus). The mucus can normally be white, gray, yellow, or green.  Call Back If:  Difficulty breathing  Cough lasts more than 3 weeks  Fever lasts > 3 days  You become worse.  Patient Refused Recommendation:  Patient Requests Prescription  Wondering if needing second antibiotic or Oral steroid.

## 2013-11-12 NOTE — Telephone Encounter (Signed)
Per Apolonio Schneiders pt advised to seek treatment at  Urgent care, needs to complete antibiotics that were started on 11/09/13.

## 2013-11-29 ENCOUNTER — Encounter: Payer: Self-pay | Admitting: Family Medicine

## 2013-11-29 ENCOUNTER — Ambulatory Visit (INDEPENDENT_AMBULATORY_CARE_PROVIDER_SITE_OTHER): Payer: Medicare Other | Admitting: Family Medicine

## 2013-11-29 VITALS — BP 120/90 | Temp 98.6°F | Wt 165.0 lb

## 2013-11-29 DIAGNOSIS — I10 Essential (primary) hypertension: Secondary | ICD-10-CM

## 2013-11-29 NOTE — Progress Notes (Signed)
Pre visit review using our clinic review tool, if applicable. No additional management support is needed unless otherwise documented below in the visit note. 

## 2013-11-29 NOTE — Progress Notes (Signed)
   Subjective:    Patient ID: [name], male    DOB: 10-27-1960, 53 y.o.   MRN: [id number]  HPI [name] is a 53 year old male nonsmoker who comes in today for followup of hypertension  He is on Cozaar 50 mg daily BP today 120/90  He states when he gets sick he stopped his blood pressure medication his blood pressure rose up to 388 systolic and now is back on his medicine daily   Review of Systems    review of systems otherwise negative Objective:   Physical Exam  Well-developed and nourished male no acute distress vital signs stable he is afebrile BP today here 120/90      Assessment & Plan:  Hypertension at goal continue current therapy

## 2013-11-29 NOTE — Patient Instructions (Signed)
Continue to take your blood pressure medication daily  Return in one year sooner if any problems

## 2013-12-06 ENCOUNTER — Telehealth: Payer: Self-pay | Admitting: Family Medicine

## 2013-12-06 MED ORDER — AMLODIPINE BESYLATE 5 MG PO TABS
5.0000 mg | ORAL_TABLET | Freq: Every day | ORAL | Status: DC
Start: 2013-12-06 — End: 2014-07-09

## 2013-12-06 NOTE — Telephone Encounter (Addendum)
Discussed with Dr. Sherren Mocha, pt to stop Losartan and start Norvasc 5 mg one tablet daily, check Bp every morning and follow up in one month.

## 2013-12-06 NOTE — Telephone Encounter (Signed)
Pt calling to report the last BP medication Dr. Sherren Mocha prescribed to him about a month ago has caused him to be extremely lathargic and sleepy.  He stopped taking the medication as of yesterday due to these symptoms.  Patient is requesting PCP to prescribe an alternate BP medication.

## 2013-12-06 NOTE — Telephone Encounter (Signed)
Left detailed message to stop Losartan and start Norvasc 5 mg one tablet daily, sent to pharmacy. Monitor blood pressure and follow up in one month with Dr. Sherren Mocha.

## 2013-12-06 NOTE — Telephone Encounter (Signed)
Left message to call office. New Rx sent to pharmacy for Norvasc 5 mg one tablet daily.

## 2014-01-14 ENCOUNTER — Telehealth: Payer: Self-pay | Admitting: Family Medicine

## 2014-01-14 MED ORDER — PREDNISONE 20 MG PO TABS: 20.0000 mg | ORAL_TABLET | Freq: Every day | ORAL | Status: DC

## 2014-01-14 NOTE — Telephone Encounter (Signed)
Rx sent to pharmacy   

## 2014-01-14 NOTE — Telephone Encounter (Signed)
Pt's wife is calling regarding pt's poison ivy, states pt is out of his prednisone 20 mg, states pt gets poison ivy every year, wants to know if dr. Sherren Mocha will provide him an rx for the meds again. [facility] to [facility] [address].

## 2014-06-06 ENCOUNTER — Other Ambulatory Visit: Payer: Self-pay | Admitting: Family Medicine

## 2014-06-24 ENCOUNTER — Ambulatory Visit (INDEPENDENT_AMBULATORY_CARE_PROVIDER_SITE_OTHER): Payer: Medicare Other | Admitting: Family Medicine

## 2014-06-24 ENCOUNTER — Encounter: Payer: Self-pay | Admitting: Family Medicine

## 2014-06-24 VITALS — BP 130/90 | Temp 98.1°F | Wt 165.0 lb

## 2014-06-24 DIAGNOSIS — N200 Calculus of kidney: Secondary | ICD-10-CM

## 2014-06-24 DIAGNOSIS — R0789 Other chest pain: Secondary | ICD-10-CM | POA: Insufficient documentation

## 2014-06-24 DIAGNOSIS — Z23 Encounter for immunization: Secondary | ICD-10-CM

## 2014-06-24 MED ORDER — POTASSIUM CITRATE ER 10 MEQ (1080 MG) PO TBCR: 20.0000 meq | EXTENDED_RELEASE_TABLET | ORAL | Status: DC

## 2014-06-24 MED ORDER — ACYCLOVIR 800 MG PO TABS: ORAL_TABLET | ORAL | Status: DC

## 2014-06-24 NOTE — Patient Instructions (Signed)
You have a condition called chest wall pain  Motrin 400 twice daily when necessary  Return when necessary

## 2014-06-24 NOTE — Progress Notes (Signed)
   Subjective:    Patient ID: [name], male    DOB: Nov 14, 1960, 53 y.o.   MRN: [id number]  HPI [name] is a 53 year old male nonsmoker who comes in today on referral from his new urologist didn't [place] for evaluation of chest pain  He tells me they will not renew this medication until he has an evaluation because he's been having chest pain which is totally appropriate.  Historically he's had it for 6 months. It comes and goes and make her once or twice a day. It may last for a few seconds may last for a few minutes sometimes a couple hours. It's a dull pain he points to the upper left sternal area as a source of his discomfort. It does not radiate. It is not associated with eating swallowing or exercise. No cardiac or pulmonary symptoms. No history of trauma. He describes his pain as a 1 on a scale of 1-10   Review of Systems Again cardiac pulmonary systems negative    Objective:   Physical Exam  Well-developed well-nourished male in no acute distress vital signs stable he is afebrile cardiac pulmonary exam normal EKG normal      Assessment & Plan:  Chest wall pain..........Marland Kitchen Reassured......Marland Kitchen Motrin 400 twice a day when necessary

## 2014-06-25 ENCOUNTER — Telehealth: Payer: Self-pay | Admitting: Family Medicine

## 2014-06-25 DIAGNOSIS — I10 Essential (primary) hypertension: Secondary | ICD-10-CM

## 2014-06-25 DIAGNOSIS — J309 Allergic rhinitis, unspecified: Secondary | ICD-10-CM

## 2014-06-25 DIAGNOSIS — G894 Chronic pain syndrome: Secondary | ICD-10-CM

## 2014-06-25 DIAGNOSIS — R0789 Other chest pain: Secondary | ICD-10-CM

## 2014-06-25 NOTE — Telephone Encounter (Signed)
Pt wife brought lab results from urologist. Pt is sch for cpx on 07/09/14. Pt wife would like for her husband to be sch for cpx labs except for labs that was already drawn. Pt wife stated she dropped results. What labs are needed

## 2014-07-04 ENCOUNTER — Telehealth: Payer: Self-pay | Admitting: Family Medicine

## 2014-07-04 ENCOUNTER — Other Ambulatory Visit (INDEPENDENT_AMBULATORY_CARE_PROVIDER_SITE_OTHER): Payer: Medicare Other

## 2014-07-04 DIAGNOSIS — I10 Essential (primary) hypertension: Secondary | ICD-10-CM

## 2014-07-04 DIAGNOSIS — G894 Chronic pain syndrome: Secondary | ICD-10-CM

## 2014-07-04 DIAGNOSIS — J309 Allergic rhinitis, unspecified: Secondary | ICD-10-CM

## 2014-07-04 DIAGNOSIS — R0789 Other chest pain: Secondary | ICD-10-CM

## 2014-07-04 LAB — POCT URINALYSIS DIPSTICK
Bilirubin, UA: NEGATIVE
GLUCOSE UA: NEGATIVE
Leukocytes, UA: NEGATIVE
Nitrite, UA: NEGATIVE
RBC UA: NEGATIVE
SPEC GRAV UA: 1.02
Urobilinogen, UA: 1
pH, UA: 6

## 2014-07-04 LAB — HEPATIC FUNCTION PANEL
ALK PHOS: 52 U/L (ref 39–117)
ALT: 43 U/L (ref 0–53)
AST: 34 U/L (ref 0–37)
Albumin: 3.8 g/dL (ref 3.5–5.2)
BILIRUBIN DIRECT: 0.1 mg/dL (ref 0.0–0.3)
BILIRUBIN TOTAL: 1 mg/dL (ref 0.2–1.2)
TOTAL PROTEIN: 7.8 g/dL (ref 6.0–8.3)

## 2014-07-04 LAB — LIPID PANEL
CHOL/HDL RATIO: 6
Cholesterol: 259 mg/dL — ABNORMAL HIGH (ref 0–200)
HDL: 42.1 mg/dL (ref >39.00)
LDL Cholesterol: 190 mg/dL — ABNORMAL HIGH (ref 0–99)
NONHDL: 216.9
TRIGLYCERIDES: 134 mg/dL (ref 0.0–149.0)
VLDL: 26.8 mg/dL (ref 0.0–40.0)

## 2014-07-04 LAB — TSH: TSH: 1.34 u[IU]/mL (ref 0.35–4.50)

## 2014-07-04 NOTE — Telephone Encounter (Signed)
Pt would like to transfer to you from Dr. Sherren Mocha since he is retiring.  Please advise.

## 2014-07-05 ENCOUNTER — Telehealth: Payer: Self-pay | Admitting: Family Medicine

## 2014-07-05 NOTE — Telephone Encounter (Signed)
Yes I can see him since I see his wife too

## 2014-07-05 NOTE — Telephone Encounter (Signed)
emmi emailed °

## 2014-07-09 ENCOUNTER — Encounter: Payer: Self-pay | Admitting: Family Medicine

## 2014-07-09 ENCOUNTER — Ambulatory Visit (INDEPENDENT_AMBULATORY_CARE_PROVIDER_SITE_OTHER): Payer: Medicare Other | Admitting: Family Medicine

## 2014-07-09 VITALS — BP 120/88 | Temp 98.4°F | Ht 68.5 in | Wt 163.0 lb

## 2014-07-09 DIAGNOSIS — Z0000 Encounter for general adult medical examination without abnormal findings: Secondary | ICD-10-CM

## 2014-07-09 DIAGNOSIS — T464X5A Adverse effect of angiotensin-converting-enzyme inhibitors, initial encounter: Secondary | ICD-10-CM

## 2014-07-09 DIAGNOSIS — E785 Hyperlipidemia, unspecified: Secondary | ICD-10-CM | POA: Insufficient documentation

## 2014-07-09 DIAGNOSIS — I10 Essential (primary) hypertension: Secondary | ICD-10-CM

## 2014-07-09 DIAGNOSIS — R05 Cough: Secondary | ICD-10-CM

## 2014-07-09 DIAGNOSIS — E782 Mixed hyperlipidemia: Secondary | ICD-10-CM

## 2014-07-09 DIAGNOSIS — J301 Allergic rhinitis due to pollen: Secondary | ICD-10-CM

## 2014-07-09 DIAGNOSIS — R058 Other specified cough: Secondary | ICD-10-CM

## 2014-07-09 HISTORY — DX: Hyperlipidemia, unspecified: E78.5

## 2014-07-09 MED ORDER — FLUTICASONE PROPIONATE 50 MCG/ACT NA SUSP: NASAL | Status: DC

## 2014-07-09 MED ORDER — LOSARTAN POTASSIUM 50 MG PO TABS: 50.0000 mg | ORAL_TABLET | Freq: Every day | ORAL | Status: DC

## 2014-07-09 MED ORDER — AMLODIPINE BESYLATE 5 MG PO TABS: 5.0000 mg | ORAL_TABLET | Freq: Every day | ORAL | Status: DC

## 2014-07-09 NOTE — Telephone Encounter (Signed)
LMOM advising pt

## 2014-07-09 NOTE — Progress Notes (Signed)
   Subjective:    Patient ID: [name], male    DOB: [birth year], 53 y.o.   MRN: [id number]  HPI [name] is a 53 year old married male nonsmoker who comes in today for evaluation  He takes acyclovir 100 mg twice a day when necessary, Norvasc 5 mg, Cozaar 50 mg daily for hypertension BP 120/88 6  He uses steroid nasal spray for allergic rhinitis. He goes to the pain clinic because of chronic back pain. He is also followed in urology for low testosterone.  He gets routine eye care, dental care, colonoscopy x2 in the past, vaccinations up-to-date   Review of Systems  Constitutional: Negative.   HENT: Negative.   Eyes: Negative.   Respiratory: Negative.   Cardiovascular: Negative.   Gastrointestinal: Negative.   Endocrine: Negative.   Genitourinary: Negative.   Musculoskeletal: Negative.   Skin: Negative.   Allergic/Immunologic: Negative.   Neurological: Negative.   Hematological: Negative.   Psychiatric/Behavioral: Negative.        Objective:   Physical Exam  Constitutional: He is oriented to person, place, and time. He appears well-developed and well-nourished.  HENT:  Head: Normocephalic and atraumatic.  Right Ear: External ear normal.  Left Ear: External ear normal.  Nose: Nose normal.  Mouth/Throat: Oropharynx is clear and moist.  Eyes: Conjunctivae and EOM are normal. Pupils are equal, round, and reactive to light.  Neck: Normal range of motion. Neck supple. No JVD present. No tracheal deviation present. No thyromegaly present.  Cardiovascular: Normal rate, regular rhythm, normal heart sounds and intact distal pulses.  Exam reveals no gallop and no friction rub.   No murmur heard. Pulmonary/Chest: Effort normal and breath sounds normal. No stridor. No respiratory distress. He has no wheezes. He has no rales. He exhibits no tenderness.  Abdominal: Soft. Bowel sounds are normal. He exhibits no distension and no mass. There is no tenderness. There is no  rebound and no guarding.  Genitourinary:  Genitorectal exam done by urology therefore not repeated  Musculoskeletal: Normal range of motion. He exhibits no edema and no tenderness.  Lymphadenopathy:    He has no cervical adenopathy.  Neurological: He is alert and oriented to person, place, and time. He has normal reflexes. No cranial nerve deficit. He exhibits normal muscle tone.  Skin: Skin is warm and dry. No rash noted. No erythema. No pallor.  Psychiatric: He has a normal mood and affect. His behavior is normal. Judgment and thought content normal.          Assessment & Plan:  Healthy male  Hypertension at goal continue current therapy  History of HSV 1,,,,,,,,,, acyclovir when necessary  Chronic pain.......... followed in the pain clinic  Low testosterone.......... followed by urology  LDL cholesterol 190.......... patient will try a fat-free diet followup lipid panel in 3 months.

## 2014-07-09 NOTE — Patient Instructions (Signed)
Continue your current medications  Followup in 1 year sooner if any problem

## 2014-07-09 NOTE — Progress Notes (Signed)
Pre visit review using our clinic review tool, if applicable. No additional management support is needed unless otherwise documented below in the visit note. 

## 2014-10-09 ENCOUNTER — Other Ambulatory Visit: Payer: Medicare Other

## 2014-10-16 ENCOUNTER — Telehealth: Payer: Self-pay | Admitting: Family Medicine

## 2014-10-16 MED ORDER — AMLODIPINE BESYLATE 10 MG PO TABS: 10.0000 mg | ORAL_TABLET | Freq: Every day | ORAL | Status: DC

## 2014-10-16 NOTE — Telephone Encounter (Signed)
Pt had a 144/101 BP yesterday so he thinks he may need an adjustment on his Norvasc. Please call.

## 2014-10-16 NOTE — Telephone Encounter (Signed)
Per Dr Sherren Mocha, increase Norvasc to 10 mg daily.  Check blood pressure first thing in the morning for 1 month.  If no improvement patient will need an office visit. Left message on machine for patient and Rx sent.

## 2014-12-02 ENCOUNTER — Ambulatory Visit (INDEPENDENT_AMBULATORY_CARE_PROVIDER_SITE_OTHER): Payer: Medicare Other | Admitting: Family Medicine

## 2014-12-02 ENCOUNTER — Encounter: Payer: Self-pay | Admitting: Family Medicine

## 2014-12-02 VITALS — BP 120/90 | HR 119 | Temp 98.0°F | Wt 165.0 lb

## 2014-12-02 DIAGNOSIS — R739 Hyperglycemia, unspecified: Secondary | ICD-10-CM

## 2014-12-02 DIAGNOSIS — R972 Elevated prostate specific antigen [PSA]: Secondary | ICD-10-CM

## 2014-12-02 DIAGNOSIS — R7309 Other abnormal glucose: Secondary | ICD-10-CM

## 2014-12-02 LAB — BASIC METABOLIC PANEL
BUN: 20 mg/dL (ref 6–23)
CO2: 27 meq/L (ref 19–32)
Calcium: 10.2 mg/dL (ref 8.4–10.5)
Chloride: 104 mEq/L (ref 96–112)
Creatinine, Ser: 1.23 mg/dL (ref 0.40–1.50)
GFR: 65.54 mL/min (ref >60.00)
Glucose, Bld: 102 mg/dL — ABNORMAL HIGH (ref 70–99)
Potassium: 4.1 mEq/L (ref 3.5–5.1)
Sodium: 137 mEq/L (ref 135–145)

## 2014-12-02 LAB — MICROALBUMIN / CREATININE URINE RATIO
Creatinine,U: 344.9 mg/dL
MICROALB/CREAT RATIO: 1.3 mg/g (ref 0.0–30.0)
Microalb, Ur: 4.5 mg/dL — ABNORMAL HIGH (ref 0.0–1.9)

## 2014-12-02 LAB — PSA: PSA: 2.58 ng/mL (ref 0.10–4.00)

## 2014-12-02 LAB — HEMOGLOBIN A1C: Hgb A1c MFr Bld: 5.8 % (ref 4.6–6.5)

## 2014-12-02 NOTE — Patient Instructions (Signed)
Labs today  Check your blood pressure daily in the morning  Continue current blood pressure medication  We will call you in a day or 2 we get the report

## 2014-12-02 NOTE — Progress Notes (Signed)
   Subjective:    Patient ID: DELMAN GOSHORN, male    DOB: 09-19-1961, 54 y.o.   MRN: [id number]  HPI  Comparison is a 54 year old married male nonsmoker who comes in today for elevated blood sugar  He says he went to see his urologist last week and was told he had 3+ sugar and return. He tells me they sent all the results of the lab work here but we have no results and he does not bring it with him.  He also has a history of mild hypertension and they told him his blood pressure was elevated  Family history pertinent all his family members on his father's side have diabetes.  He himself is asymptomatic  He also tells me that they said his PSA was elevated. Again he does not have a record of the number. Advised and get a follow-up lab test   Review of Systems    review of systems negative no weight loss etc. Objective:   Physical Exam  Well-developed well-nourished male no acute distress BP right arm sitting position 120/90      Assessment & Plan:  Elevated blood sugar,,,,,,, check labs  Elevated PSA......Marland Kitchen repeat labs

## 2015-01-14 ENCOUNTER — Other Ambulatory Visit: Payer: Self-pay

## 2015-01-14 ENCOUNTER — Other Ambulatory Visit (INDEPENDENT_AMBULATORY_CARE_PROVIDER_SITE_OTHER): Payer: Medicare Other

## 2015-01-14 DIAGNOSIS — E785 Hyperlipidemia, unspecified: Secondary | ICD-10-CM

## 2015-01-14 LAB — LIPID PANEL
Cholesterol: 227 mg/dL — ABNORMAL HIGH (ref 0–200)
HDL: 50.5 mg/dL (ref >39.00)
LDL Cholesterol: 155 mg/dL — ABNORMAL HIGH (ref 0–99)
NONHDL: 176.5
TRIGLYCERIDES: 109 mg/dL (ref 0.0–149.0)
Total CHOL/HDL Ratio: 4
VLDL: 21.8 mg/dL (ref 0.0–40.0)

## 2015-01-28 ENCOUNTER — Telehealth: Payer: Self-pay | Admitting: Family Medicine

## 2015-01-28 MED ORDER — PREDNISONE 20 MG PO TABS: 20.0000 mg | ORAL_TABLET | Freq: Every day | ORAL | Status: DC

## 2015-01-28 NOTE — Telephone Encounter (Signed)
Pt has poison ivy and is requesting some prednisone    [facility] [place]

## 2015-04-08 ENCOUNTER — Ambulatory Visit: Payer: Self-pay | Admitting: Urology

## 2015-04-22 ENCOUNTER — Ambulatory Visit: Payer: Self-pay | Admitting: Urology

## 2015-05-20 ENCOUNTER — Encounter: Payer: Self-pay | Admitting: *Deleted

## 2015-05-27 ENCOUNTER — Ambulatory Visit (INDEPENDENT_AMBULATORY_CARE_PROVIDER_SITE_OTHER): Payer: Medicare Other | Admitting: Urology

## 2015-05-27 ENCOUNTER — Encounter: Payer: Self-pay | Admitting: Urology

## 2015-05-27 VITALS — BP 140/101 | HR 108 | Ht 68.5 in | Wt 164.2 lb

## 2015-05-27 DIAGNOSIS — N200 Calculus of kidney: Secondary | ICD-10-CM | POA: Diagnosis not present

## 2015-05-27 DIAGNOSIS — E291 Testicular hypofunction: Secondary | ICD-10-CM | POA: Diagnosis not present

## 2015-05-27 DIAGNOSIS — N138 Other obstructive and reflux uropathy: Secondary | ICD-10-CM

## 2015-05-27 DIAGNOSIS — N401 Enlarged prostate with lower urinary tract symptoms: Secondary | ICD-10-CM | POA: Diagnosis not present

## 2015-05-27 NOTE — Progress Notes (Signed)
05/27/2015 2:53 PM   [name] [birth year] [id number]  Referring provider: Dorena Cookey, MD [place], [place] [address]  Chief Complaint  Patient presents with  . Benign Prostatic Hypertrophy    4 month check up    HPI: Patient is a 54 year old white male with a history of hypogonadism, kidney stones and BPH with LUTS who presents today for follow-up.  Hypogonadism Patient is currently not receiving treatment for his hypogonadism. He declines any treatment for his hypogonadism at this time.  Kidney stones Patient is currently receiving Urocit-K through his primary care provider.  He has not had any flank or gross hematuria.  BPH WITH LUTS His major complaint today nocturia 2.  He has had these symptoms for any years.  He denies any dysuria, hematuria or suprapubic pain.      He also denies any recent fevers, chills, nausea or vomiting.  He does not have a family history of PCa.  PMH: Past Medical History  Diagnosis Date  . Chronic pain   . 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 Kidney stones   . Benign enlargement of prostate   . Hypogonadism in male   . Functional impotence     Surgical History: Past Surgical History  Procedure Laterality Date  . Lumbar disc surgery      x 3  . Appendectomy    . Cervical fusion    . Cervical laminectomy      Home Medications:    Medication List       This list is accurate as of: 05/27/15  2:53 PM.  Always use your most recent med list.               acyclovir 800 MG tablet  Commonly known as:  ZOVIRAX  TAKE 1 TABLET BY MOUTH 2 TIMES DAILY.     acyclovir ointment 5 %  Commonly known as:  ZOVIRAX  Apply 1 application topically every 3 (three) hours.     amLODipine 10 MG tablet  Commonly known as:  NORVASC  Take 1 tablet (10 mg total) by mouth daily.     amphetamine-dextroamphetamine 10 MG 24 hr capsule  Commonly known as:  ADDERALL XR  Take 5 mg by mouth every morning. Take 1/4 tablet as  needed     cetirizine 10 MG tablet  Commonly known as:  ZYRTEC  Take 10 mg by mouth daily.     clomiPHENE 50 MG tablet  Commonly known as:  CLOMID  Take 1 tablet (50 mg total) by mouth daily. 1/4 tab daily     diazepam 10 MG tablet  Commonly known as:  VALIUM  Take 10 mg by mouth every 6 (six) hours as needed.     diclofenac sodium 1 % Gel  Commonly known as:  VOLTAREN  Apply 1 application topically as needed.     EPINEPHrine 0.3 mg/0.3 mL Devi  Commonly known as:  EPI-PEN  Inject 0.3 mLs (0.3 mg total) into the muscle once.     fentaNYL 50 MCG/HR  Commonly known as:  DURAGESIC - dosed mcg/hr  Place 50 mcg onto the skin every other day.     fluticasone 50 MCG/ACT nasal spray  Commonly known as:  FLONASE  USE 2 SPRAYS IN EACH NOSTRIL DAILY     hydrocortisone-pramoxine rectal foam  Commonly known as:  PROCTOFOAM HC  Place 1 applicator rectally 2 (two) times daily.     losartan 50 MG tablet  Commonly  known as:  COZAAR  Take 1 tablet (50 mg total) by mouth daily.     OVER THE COUNTER MEDICATION  Testosterone lipoderm 15%     oxyCODONE 15 MG Tb12  Commonly known as:  OXYCONTIN  Take 10 mg by mouth daily.     potassium citrate 10 MEQ (1080 MG) SR tablet  Commonly known as:  UROCIT-K  Take 2 tablets (20 mEq total) by mouth every morning.     pramoxine 1 % foam  Commonly known as:  PROCTOFOAM  Place 1 application rectally 3 (three) times daily as needed for itching.     predniSONE 20 MG tablet  Commonly known as:  DELTASONE  Take 1 tablet (20 mg total) by mouth daily with breakfast.     traZODone 100 MG tablet  Commonly known as:  DESYREL  Take 50 mg by mouth at bedtime.        Allergies:  Allergies  Allergen Reactions  . Codeine     REACTION: ITCHING  . Ketorolac Tromethamine Other (See Comments)    REACTION: SEVERE VOMMITING  . Lisinopril     Cough & chest tightness    Family History: Family History  Problem Relation Age of Onset  . Heart  disease Other   . Diabetes Other   . Hyperlipidemia Other   . Prostate cancer Neg Hx   . Hypertension Sister   . Kidney disease Neg Hx     Social History:  reports that he has quit smoking. He has never used smokeless tobacco. He reports that he drinks alcohol. He reports that he does not use illicit drugs.  ROS: UROLOGY Frequent Urination?: No Hard to postpone urination?: No Burning/pain with urination?: No Get up at night to urinate?: Yes Leakage of urine?: No Urine stream starts and stops?: No Trouble starting stream?: No Do you have to strain to urinate?: No Blood in urine?: No Urinary tract infection?: No Sexually transmitted disease?: No Injury to kidneys or bladder?: No Painful intercourse?: No Weak stream?: No Erection problems?: No Penile pain?: No  Gastrointestinal Nausea?: No Vomiting?: No Indigestion/heartburn?: No Diarrhea?: No Constipation?: Yes  Constitutional Fever: No Night sweats?: No Weight loss?: No Fatigue?: No  Skin Skin rash/lesions?: No Itching?: No  Eyes Blurred vision?: No Double vision?: No  Ears/Nose/Throat Sore throat?: No Sinus problems?: Yes  Hematologic/Lymphatic Swollen glands?: No Easy bruising?: No  Cardiovascular Leg swelling?: No Chest pain?: No  Respiratory Cough?: No Shortness of breath?: No  Endocrine Excessive thirst?: No  Musculoskeletal Back pain?: Yes Joint pain?: No  Neurological Headaches?: No Dizziness?: No  Psychologic Depression?: No Anxiety?: Yes  Physical Exam: BP 140/101 mmHg  Pulse 108  Ht 5' 8.5" (1.74 m)  Wt 164 lb 3.2 oz (74.481 kg)  BMI 24.60 kg/m2  GU: Patient with circumcised phallus. Urethral meatus is patent.  No penile discharge. No penile lesions or rashes. Scrotum without lesions, cysts, rashes and/or edema.  Testicles are located scrotally bilaterally. No masses are appreciated in the testicles. Left and right epididymis are normal. Rectal: Patient with  normal  sphincter tone. Perineum without scarring or rashes. No rectal masses are appreciated. Prostate is approximately 45 grams, no nodules are appreciated. Seminal vesicles are normal.   Laboratory Data: Lab Results  Component Value Date   WBC 6.8 12/18/2012   HGB 16.7 12/18/2012   HCT 47.9 12/18/2012   MCV 88.5 12/18/2012   PLT 206.0 12/18/2012    Lab Results  Component Value Date   CREATININE 1.23  12/02/2014    Lab Results  Component Value Date   PSA 2.58 12/02/2014   PSA 0.71 12/18/2012   PSA history:   1.4 ng/mL on 11/26/2013  1.3 ng/mL on 05/29/2014    Lab Results  Component Value Date   TESTOSTERONE 260.02* 12/18/2012    Lab Results  Component Value Date   HGBA1C 5.8 12/02/2014    Assessment & Plan:    1. BPH with LUTS:    His DRE demonstrates enlargement, no nodules.  We will continue observation over time with prn avoidance of alcohol/caffeine.  He will follow up in 6 months for a PSA, DRE, PVR and an IPSS.    - PSA  2. Hypogonadism:   Patient does not desire any treatment for his hypogonadism at this time. We will reassess when he returns to the clinic in 6 months.  3. Kidney stones:   Patient stones are currently managed through his PCP who is prescribing Urocit-K. He has not had any recent flank pain or gross hematuria.   No Follow-up on file.  Zara Council, [facility] [address] [place], [place] [address] [phone]

## 2015-05-28 ENCOUNTER — Telehealth: Payer: Self-pay

## 2015-05-28 DIAGNOSIS — N200 Calculus of kidney: Secondary | ICD-10-CM | POA: Insufficient documentation

## 2015-05-28 DIAGNOSIS — N401 Enlarged prostate with lower urinary tract symptoms: Principal | ICD-10-CM

## 2015-05-28 DIAGNOSIS — E291 Testicular hypofunction: Secondary | ICD-10-CM | POA: Insufficient documentation

## 2015-05-28 DIAGNOSIS — N138 Other obstructive and reflux uropathy: Secondary | ICD-10-CM | POA: Insufficient documentation

## 2015-05-28 LAB — PSA: PROSTATE SPECIFIC AG, SERUM: 2 ng/mL (ref 0.0–4.0)

## 2015-05-28 NOTE — Telephone Encounter (Signed)
Spoke with pt and made aware of PSA results. Pt voiced understanding.  

## 2015-05-28 NOTE — Telephone Encounter (Signed)
-----   Message from Nori Riis, PA-C sent at 05/28/2015  1:12 PM EDT ----- [name] PSA is stable.  We will see him in 6 months.  PSA to be drawn before his next appointment.

## 2015-09-09 ENCOUNTER — Encounter: Payer: Self-pay | Admitting: Adult Health

## 2015-09-09 ENCOUNTER — Ambulatory Visit (INDEPENDENT_AMBULATORY_CARE_PROVIDER_SITE_OTHER): Payer: Medicare Other | Admitting: Adult Health

## 2015-09-09 VITALS — BP 140/100 | Temp 98.1°F | Ht 68.5 in | Wt 163.6 lb

## 2015-09-09 DIAGNOSIS — R42 Dizziness and giddiness: Secondary | ICD-10-CM

## 2015-09-09 DIAGNOSIS — I10 Essential (primary) hypertension: Secondary | ICD-10-CM

## 2015-09-09 DIAGNOSIS — Z23 Encounter for immunization: Secondary | ICD-10-CM

## 2015-09-09 DIAGNOSIS — L259 Unspecified contact dermatitis, unspecified cause: Secondary | ICD-10-CM | POA: Diagnosis not present

## 2015-09-09 DIAGNOSIS — R202 Paresthesia of skin: Secondary | ICD-10-CM

## 2015-09-09 LAB — BASIC METABOLIC PANEL
BUN: 17 mg/dL (ref 6–23)
CO2: 26 mEq/L (ref 19–32)
Calcium: 9.8 mg/dL (ref 8.4–10.5)
Chloride: 100 mEq/L (ref 96–112)
Creatinine, Ser: 1.05 mg/dL (ref 0.40–1.50)
GFR: 78.44 mL/min (ref >60.00)
Glucose, Bld: 98 mg/dL (ref 70–99)
POTASSIUM: 4.2 meq/L (ref 3.5–5.1)
Sodium: 138 mEq/L (ref 135–145)

## 2015-09-09 MED ORDER — HYDROCHLOROTHIAZIDE 25 MG PO TABS: 25.0000 mg | ORAL_TABLET | Freq: Every day | ORAL | Status: DC

## 2015-09-09 MED ORDER — METHYLPREDNISOLONE ACETATE 80 MG/ML IJ SUSP
120.0000 mg | Freq: Once | INTRAMUSCULAR | Status: DC
Start: 2015-09-09 — End: 2015-09-09

## 2015-09-09 MED ORDER — METHYLPREDNISOLONE ACETATE 80 MG/ML IJ SUSP
80.0000 mg | Freq: Once | INTRAMUSCULAR | Status: AC
  Administered 2015-09-09: 80 mg via INTRAMUSCULAR

## 2015-09-09 NOTE — Patient Instructions (Signed)
It was great meeting you today!  I am going write you a prescription for HCTZ, this will be for your blood pressure. Make sure you stay well hydrated while taking this medication. Start monitoring your blood pressure at home and bring a log to your appointment with Dr. Sarajane Jews.   I will follow up with you by the end of the day if there is any problem with your labs.   Follow up with Dr. Sarajane Jews as directed

## 2015-09-09 NOTE — Progress Notes (Signed)
Pre visit review using our clinic review tool, if applicable. No additional management support is needed unless otherwise documented below in the visit note. 

## 2015-09-09 NOTE — Progress Notes (Signed)
Subjective:    Patient ID: [name], male    DOB: [birth year], 54 y.o.   MRN: [id number]  HPI    54 year old gentleman, patient of Dr. Sherren Mocha who is switching to Dr. Sarajane Jews, comes to the office today with multiple complaints.   Hypertension - His blood pressure has been elevated for an unknown period of time. He is currently taking Norvasc 10 mg. Has been off Cozaar for close to a year.   Dizziness  - Over the last two weeks he has noticed that he becomes dizzy when going from a sitting to standing position. These episodes only last a few seconds. 4 days ago while at home he had a spell where " the room was spinning and I had to sit on the floor because I thought I was going to pass out." This episode lasted about 10 minutes. He has not had any other episodes since then. He contributes this to stress. He has recently started working again after being on disability for 16 years.   Right toe numbness - This has been an issue for the last 3-4 months. He feels as though over the past few months he has had numbness in all of his toes in the right foot. The numbness has been getting worse as time progresses. Nothing relieves the discomfort. Does have numbness and tingling in the right leg ( chronic issue from lumbar surgery).   Rash on right buttocks. - He has had a itchy, red rash on the right buttocks for a week. He is unsure if he has changed detergents, soaps. Has not made any diatary changes. Noticed that if he sleeps on his right side that the rash is present. If he sleeps on his left side he will have a rash on th left buttocks. Denies any pain or drainage from the rash   Review of Systems  Constitutional: Negative.   HENT: Negative.   Eyes: Negative.   Respiratory: Negative.   Cardiovascular: Negative.   Gastrointestinal: Negative.   Musculoskeletal: Positive for back pain and arthralgias. Negative for myalgias, joint swelling, gait problem, neck pain and neck stiffness.  Skin:  Negative.   Neurological: Positive for dizziness, light-headedness and numbness. Negative for tremors, syncope, speech difficulty, weakness and headaches.  Psychiatric/Behavioral: Negative.   All other systems reviewed and are negative.  Past Medical History  Diagnosis Date  . Chronic pain   . 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 Kidney stones   . Benign enlargement of prostate   . Hypogonadism in male   . Functional impotence     Social History   Social History  . Marital Status: Married    Spouse Name: N/A  . Number of Children: N/A  . Years of Education: N/A   Occupational History  . Not on file.   Social History Main Topics  . Smoking status: Former Research scientist (life sciences)  . Smokeless tobacco: Never Used     Comment: quit 35 years  . Alcohol Use: 0.0 oz/week    0 Standard drinks or equivalent per week     Comment: occasional  . Drug Use: No  . Sexual Activity: Yes    Birth Control/ Protection: Condom   Other Topics Concern  . Not on file   Social History Narrative    Past Surgical History  Procedure Laterality Date  . Lumbar disc surgery      x 3  . Appendectomy    . Cervical fusion    .  Cervical laminectomy      Family History  Problem Relation Age of Onset  . Heart disease Other   . Diabetes Other   . Hyperlipidemia Other   . Prostate cancer Neg Hx   . Hypertension Sister   . Kidney disease Neg Hx     Allergies  Allergen Reactions  . Codeine     REACTION: ITCHING  . Ketorolac Tromethamine Other (See Comments)    REACTION: SEVERE VOMMITING  . Lisinopril     Cough & chest tightness    Current Outpatient Prescriptions on File Prior to Visit  Medication Sig Dispense Refill  . acyclovir (ZOVIRAX) 800 MG tablet TAKE 1 TABLET BY MOUTH 2 TIMES DAILY. 200 tablet 3  . acyclovir ointment (ZOVIRAX) 5 % Apply 1 application topically every 3 (three) hours. 30 g 3  . amLODipine (NORVASC) 10 MG tablet Take 1 tablet (10 mg total) by mouth daily. 90 tablet 3  .  amphetamine-dextroamphetamine (ADDERALL XR) 10 MG 24 hr capsule Take 5 mg by mouth every morning. Take 1/4 tablet as needed    . cetirizine (ZYRTEC) 10 MG tablet Take 10 mg by mouth daily.      . diazepam (VALIUM) 10 MG tablet Take 10 mg by mouth every 6 (six) hours as needed.      . diclofenac sodium (VOLTAREN) 1 % GEL Apply 1 application topically as needed.      Marland Kitchen EPINEPHrine (EPI-PEN) 0.3 mg/0.3 mL DEVI Inject 0.3 mLs (0.3 mg total) into the muscle once. 2 Device 1  . fentaNYL (DURAGESIC - DOSED MCG/HR) 50 MCG/HR Place 50 mcg onto the skin every other day.    . fluticasone (FLONASE) 50 MCG/ACT nasal spray USE 2 SPRAYS IN EACH NOSTRIL DAILY 16 g 11  . hydrocortisone-pramoxine (PROCTOFOAM HC) rectal foam Place 1 applicator rectally 2 (two) times daily. 10 g 3  . potassium citrate (UROCIT-K) 10 MEQ (1080 MG) SR tablet Take 2 tablets (20 mEq total) by mouth every morning. 200 each 3  . pramoxine (PROCTOFOAM) 1 % foam Place 1 application rectally 3 (three) times daily as needed for itching. 15 g 0  . OVER THE COUNTER MEDICATION Testosterone lipoderm 15%    . traZODone (DESYREL) 100 MG tablet Take 50 mg by mouth at bedtime.      No current facility-administered medications on file prior to visit.    BP 140/100 mmHg  Temp(Src) 98.1 F (36.7 C) (Oral)  Ht 5' 8.5" (1.74 m)  Wt 163 lb 9.6 oz (74.208 kg)  BMI 24.51 kg/m2       Objective:   Physical Exam  Constitutional: He is oriented to person, place, and time. He appears well-developed and well-nourished. No distress.  HENT:  Head: Normocephalic and atraumatic.  Right Ear: External ear normal.  Left Ear: External ear normal.  Nose: Nose normal.  Mouth/Throat: Oropharynx is clear and moist. No oropharyngeal exudate.  TM's visualized. No cerumen impaction. No signs of infection  Eyes: Conjunctivae and EOM are normal. Pupils are equal, round, and reactive to light. Right eye exhibits no discharge. Left eye exhibits no discharge. No  scleral icterus.  Neck: Normal range of motion. Neck supple. No thyromegaly present.  Cardiovascular: Normal rate, regular rhythm, normal heart sounds and intact distal pulses.  Exam reveals no gallop and no friction rub.   No murmur heard. Pulmonary/Chest: Effort normal and breath sounds normal. No respiratory distress. He has no wheezes. He has no rales. He exhibits no tenderness.  Musculoskeletal: Normal range  of motion. He exhibits no edema or tenderness.  Lymphadenopathy:    He has no cervical adenopathy.  Neurological: He is alert and oriented to person, place, and time.  Skin: Skin is warm and dry. Rash (red papular rash on right buttocks) noted. He is not diaphoretic. No erythema. No pallor.  No discoloration to right foot. Good cap refill in all toes on right side. Good dorsalis pedis pulses.   Psychiatric: He has a normal mood and affect. His behavior is normal. Thought content normal.  Nursing note and vitals reviewed.      Assessment & Plan:  1. Essential hypertension - Continue with Norvasc 10 mg. Monitor BP at home - Basic metabolic panel - hydrochlorothiazide (HYDRODIURIL) 25 MG tablet; Take 1 tablet (25 mg total) by mouth daily.  Dispense: 30 tablet; Refill: 3  2. Contact dermatitis - methylPREDNISolone acetate (DEPO-MEDROL) injection 80 mg; Inject 1 mL (80 mg total) into the muscle once.  3. Encounter for immunization Flu shot given   4. Paresthesia of right foot - possibly sciatica or pinched nerve - methylPREDNISolone acetate (DEPO-MEDROL) injection 80 mg; Inject 1 mL (80 mg total) into the muscle once.  5. Dizzinesses - Vertigo vs hypertension related - Will trial additional blood pressure medication - Stay well hydrated - home vestibular exercises given - If not resolved with HCTZ then consider vestibular PT

## 2015-09-10 ENCOUNTER — Other Ambulatory Visit: Payer: Self-pay | Admitting: Adult Health

## 2015-09-10 ENCOUNTER — Telehealth: Payer: Self-pay | Admitting: Family Medicine

## 2015-09-10 MED ORDER — TRIAMCINOLONE ACETONIDE 0.1 % EX CREA: 1.0000 "application " | TOPICAL_CREAM | Freq: Two times a day (BID) | CUTANEOUS | Status: DC

## 2015-09-10 NOTE — Telephone Encounter (Signed)
Pt was seen yesterday and did received cream for rash. [facility]

## 2015-09-10 NOTE — Telephone Encounter (Signed)
Sent to pharmacy 

## 2015-09-10 NOTE — Telephone Encounter (Signed)
Left a message for pt that rx was sent to the pharmacy.

## 2015-09-24 ENCOUNTER — Ambulatory Visit (INDEPENDENT_AMBULATORY_CARE_PROVIDER_SITE_OTHER): Payer: Medicare Other | Admitting: Family Medicine

## 2015-09-24 ENCOUNTER — Encounter: Payer: Self-pay | Admitting: Family Medicine

## 2015-09-24 VITALS — BP 127/91 | HR 104 | Temp 98.1°F | Ht 68.5 in | Wt 162.0 lb

## 2015-09-24 DIAGNOSIS — R200 Anesthesia of skin: Secondary | ICD-10-CM | POA: Diagnosis not present

## 2015-09-24 DIAGNOSIS — I10 Essential (primary) hypertension: Secondary | ICD-10-CM | POA: Diagnosis not present

## 2015-09-24 DIAGNOSIS — T783XXD Angioneurotic edema, subsequent encounter: Secondary | ICD-10-CM

## 2015-09-24 MED ORDER — LOSARTAN POTASSIUM-HCTZ 50-12.5 MG PO TABS: 1.0000 | ORAL_TABLET | Freq: Every day | ORAL | Status: DC

## 2015-09-24 MED ORDER — EPINEPHRINE 0.3 MG/0.3ML IJ SOAJ: 0.3000 mg | Freq: Once | INTRAMUSCULAR | Status: DC

## 2015-09-24 MED ORDER — AMLODIPINE BESYLATE 10 MG PO TABS: 10.0000 mg | ORAL_TABLET | Freq: Every day | ORAL | Status: DC

## 2015-09-24 NOTE — Progress Notes (Signed)
   Subjective:    Patient ID: [name], male    DOB: Feb 22, 1961, 54 y.o.   MRN: [id number]  HPI 54 yr old male to establish with me after transfering from the care of Dr. Sherren Mocha. He has a few issues to discuss today. One is numbness of all 5 toes on the right foot which started about 2 months ago. No pain or color changes. The left foot is fine. Also he wants to recheck his HTN. He has been on Amlodipine for several years, and he was recently started on HCTZ 25 mg daily as well. This has helped a bit with the BP but it still runs high. He checks this at home several times a week. He continues to see Dr. Hardin Negus for chronic pain control.    Review of Systems  Constitutional: Negative.   Respiratory: Negative.   Cardiovascular: Negative.   Endocrine: Negative.   Neurological: Positive for numbness.  Hematological: Negative.        Objective:   Physical Exam  Constitutional: He is oriented to person, place, and time. He appears well-developed and well-nourished.  Neck: No thyromegaly present.  Cardiovascular: Normal rate, regular rhythm, normal heart sounds and intact distal pulses.   Pulmonary/Chest: Effort normal and breath sounds normal.  Musculoskeletal: He exhibits no edema.  Lymphadenopathy:    He has no cervical adenopathy.  Neurological: He is alert and oriented to person, place, and time.          Assessment & Plan:  For the HTN we will add Losartan to his regimen in the form of Losartan HCT. His toe numbness could represent a tarsal tunnel syndrome or it could be early neuropathy. He is scheduled for a cpx soon and he will get the usual lab tests with this. We will add a B12 level to these labs to look for etiologies of possible neuropathy.

## 2015-09-24 NOTE — Progress Notes (Signed)
Pre visit review using our clinic review tool, if applicable. No additional management support is needed unless otherwise documented below in the visit note. 

## 2015-10-15 ENCOUNTER — Other Ambulatory Visit (INDEPENDENT_AMBULATORY_CARE_PROVIDER_SITE_OTHER): Payer: BC Managed Care – PPO

## 2015-10-15 DIAGNOSIS — Z0000 Encounter for general adult medical examination without abnormal findings: Secondary | ICD-10-CM

## 2015-10-15 DIAGNOSIS — R200 Anesthesia of skin: Secondary | ICD-10-CM

## 2015-10-15 LAB — CBC WITH DIFFERENTIAL/PLATELET
Basophils Absolute: 0 10*3/uL (ref 0.0–0.1)
Basophils Relative: 0.5 % (ref 0.0–3.0)
EOS ABS: 0.1 10*3/uL (ref 0.0–0.7)
EOS PCT: 1.2 % (ref 0.0–5.0)
HCT: 48.7 % (ref 39.0–52.0)
HEMOGLOBIN: 16 g/dL (ref 13.0–17.0)
Lymphocytes Relative: 24.7 % (ref 12.0–46.0)
Lymphs Abs: 1.9 10*3/uL (ref 0.7–4.0)
MCHC: 32.8 g/dL (ref 30.0–36.0)
MCV: 91.4 fl (ref 78.0–100.0)
MONO ABS: 0.6 10*3/uL (ref 0.1–1.0)
Monocytes Relative: 7.9 % (ref 3.0–12.0)
Neutro Abs: 5.2 10*3/uL (ref 1.4–7.7)
Neutrophils Relative %: 65.7 % (ref 43.0–77.0)
Platelets: 214 10*3/uL (ref 150.0–400.0)
RBC: 5.33 Mil/uL (ref 4.22–5.81)
RDW: 13.5 % (ref 11.5–15.5)
WBC: 7.9 10*3/uL (ref 4.0–10.5)

## 2015-10-15 LAB — POCT URINALYSIS DIPSTICK
Bilirubin, UA: NEGATIVE
Glucose, UA: NEGATIVE
Ketones, UA: NEGATIVE
Leukocytes, UA: NEGATIVE
NITRITE UA: NEGATIVE
PH UA: 6
PROTEIN UA: NEGATIVE
RBC UA: NEGATIVE
SPEC GRAV UA: 1.02
UROBILINOGEN UA: 0.2

## 2015-10-15 LAB — TSH: TSH: 2.15 u[IU]/mL (ref 0.35–4.50)

## 2015-10-15 LAB — BASIC METABOLIC PANEL
BUN: 17 mg/dL (ref 6–23)
CO2: 30 mEq/L (ref 19–32)
CREATININE: 1.1 mg/dL (ref 0.40–1.50)
Calcium: 9.3 mg/dL (ref 8.4–10.5)
Chloride: 99 mEq/L (ref 96–112)
GFR: 74.31 mL/min (ref >60.00)
Glucose, Bld: 105 mg/dL — ABNORMAL HIGH (ref 70–99)
POTASSIUM: 3.9 meq/L (ref 3.5–5.1)
Sodium: 138 mEq/L (ref 135–145)

## 2015-10-15 LAB — LIPID PANEL
CHOLESTEROL: 235 mg/dL — AB (ref 0–200)
HDL: 51.8 mg/dL (ref >39.00)
LDL CALC: 164 mg/dL — AB (ref 0–99)
NonHDL: 182.97
TRIGLYCERIDES: 96 mg/dL (ref 0.0–149.0)
Total CHOL/HDL Ratio: 5
VLDL: 19.2 mg/dL (ref 0.0–40.0)

## 2015-10-15 LAB — PSA: PSA: 2.44 ng/mL (ref 0.10–4.00)

## 2015-10-15 LAB — HEPATIC FUNCTION PANEL
ALBUMIN: 4.4 g/dL (ref 3.5–5.2)
ALT: 42 U/L (ref 0–53)
AST: 30 U/L (ref 0–37)
Alkaline Phosphatase: 69 U/L (ref 39–117)
BILIRUBIN TOTAL: 0.7 mg/dL (ref 0.2–1.2)
Bilirubin, Direct: 0.1 mg/dL (ref 0.0–0.3)
Total Protein: 7.1 g/dL (ref 6.0–8.3)

## 2015-10-15 LAB — VITAMIN B12: Vitamin B-12: 239 pg/mL (ref 211–911)

## 2015-10-19 ENCOUNTER — Other Ambulatory Visit: Payer: Self-pay | Admitting: Family Medicine

## 2015-10-22 ENCOUNTER — Encounter: Payer: Medicare Other | Admitting: Family Medicine

## 2015-10-30 ENCOUNTER — Ambulatory Visit (INDEPENDENT_AMBULATORY_CARE_PROVIDER_SITE_OTHER): Payer: PPO | Admitting: Family Medicine

## 2015-10-30 ENCOUNTER — Encounter: Payer: Self-pay | Admitting: Family Medicine

## 2015-10-30 VITALS — BP 131/87 | HR 99 | Temp 98.1°F | Ht 68.5 in | Wt 164.0 lb

## 2015-10-30 DIAGNOSIS — M25571 Pain in right ankle and joints of right foot: Secondary | ICD-10-CM

## 2015-10-30 DIAGNOSIS — Z0000 Encounter for general adult medical examination without abnormal findings: Secondary | ICD-10-CM

## 2015-10-30 DIAGNOSIS — N200 Calculus of kidney: Secondary | ICD-10-CM | POA: Diagnosis not present

## 2015-10-30 DIAGNOSIS — B009 Herpesviral infection, unspecified: Secondary | ICD-10-CM

## 2015-10-30 MED ORDER — POTASSIUM CITRATE ER 10 MEQ (1080 MG) PO TBCR
10.0000 meq | EXTENDED_RELEASE_TABLET | Freq: Every day | ORAL | Status: DC
Start: 2015-10-30 — End: 2016-11-08

## 2015-10-30 MED ORDER — ACYCLOVIR 800 MG PO TABS: ORAL_TABLET | ORAL | Status: DC

## 2015-10-30 MED ORDER — ACYCLOVIR 5 % EX OINT: 1.0000 "application " | TOPICAL_OINTMENT | CUTANEOUS | Status: DC

## 2015-10-30 NOTE — Progress Notes (Signed)
Pre visit review using our clinic review tool, if applicable. No additional management support is needed unless otherwise documented below in the visit note. 

## 2015-10-30 NOTE — Progress Notes (Signed)
   Subjective:    Patient ID: [name], male    DOB: December 31, 1960, 55 y.o.   MRN: [id number]  HPI 55 yr old male for a cpx. He has anumber of issues to discuss. First he has had pain in the right foot for over a year, but this has become more of a problem in the past few weeks. No hx of trauma. No swelling or erythema. He has flares of oral and genital herpes and he needs refills for Acyclovir. His HTN is stable. His ADHD, anxiety, and depression are all stable.    Review of Systems  Constitutional: Negative.   HENT: Negative.   Eyes: Negative.   Respiratory: Negative.   Cardiovascular: Negative.   Gastrointestinal: Negative.   Genitourinary: Negative.   Musculoskeletal: Negative.   Skin: Negative.   Neurological: Negative.   Psychiatric/Behavioral: Negative.        Objective:   Physical Exam  Constitutional: He is oriented to person, place, and time. He appears well-developed and well-nourished. No distress.  HENT:  Head: Normocephalic and atraumatic.  Right Ear: External ear normal.  Left Ear: External ear normal.  Nose: Nose normal.  Mouth/Throat: Oropharynx is clear and moist. No oropharyngeal exudate.  Eyes: Conjunctivae and EOM are normal. Pupils are equal, round, and reactive to light. Right eye exhibits no discharge. Left eye exhibits no discharge. No scleral icterus.  Neck: Neck supple. No JVD present. No tracheal deviation present. No thyromegaly present.  Cardiovascular: Normal rate, regular rhythm, normal heart sounds and intact distal pulses.  Exam reveals no gallop and no friction rub.   No murmur heard. Pulmonary/Chest: Effort normal and breath sounds normal. No respiratory distress. He has no wheezes. He has no rales. He exhibits no tenderness.  Abdominal: Soft. Bowel sounds are normal. He exhibits no distension and no mass. There is no tenderness. There is no rebound and no guarding.  Genitourinary: Rectum normal, prostate normal and penis normal.  Guaiac negative stool. No penile tenderness.  Musculoskeletal: Normal range of motion. He exhibits no edema or tenderness.  Lymphadenopathy:    He has no cervical adenopathy.  Neurological: He is alert and oriented to person, place, and time. He has normal reflexes. No cranial nerve deficit. He exhibits normal muscle tone. Coordination normal.  Skin: Skin is warm and dry. No rash noted. He is not diaphoretic. No erythema. No pallor.  Psychiatric: He has a normal mood and affect. His behavior is normal. Judgment and thought content normal.          Assessment & Plan:  Well exam. We discussed diet and exercise.

## 2015-11-19 ENCOUNTER — Ambulatory Visit: Payer: PPO | Admitting: Podiatry

## 2015-11-21 ENCOUNTER — Other Ambulatory Visit: Payer: Medicare Other

## 2015-11-25 ENCOUNTER — Ambulatory Visit: Payer: Medicare Other | Admitting: Urology

## 2015-12-01 ENCOUNTER — Ambulatory Visit: Payer: Self-pay

## 2015-12-01 ENCOUNTER — Ambulatory Visit (INDEPENDENT_AMBULATORY_CARE_PROVIDER_SITE_OTHER): Payer: BC Managed Care – PPO | Admitting: Podiatry

## 2015-12-01 ENCOUNTER — Ambulatory Visit (INDEPENDENT_AMBULATORY_CARE_PROVIDER_SITE_OTHER): Payer: BC Managed Care – PPO

## 2015-12-01 ENCOUNTER — Encounter: Payer: Self-pay | Admitting: Podiatry

## 2015-12-01 VITALS — BP 143/104 | HR 109 | Resp 16 | Ht 68.5 in | Wt 163.0 lb

## 2015-12-01 DIAGNOSIS — M779 Enthesopathy, unspecified: Secondary | ICD-10-CM | POA: Diagnosis not present

## 2015-12-01 DIAGNOSIS — D3610 Benign neoplasm of peripheral nerves and autonomic nervous system, unspecified: Secondary | ICD-10-CM

## 2015-12-01 DIAGNOSIS — G5761 Lesion of plantar nerve, right lower limb: Secondary | ICD-10-CM

## 2015-12-01 MED ORDER — TRIAMCINOLONE ACETONIDE 10 MG/ML IJ SUSP
10.0000 mg | Freq: Once | INTRAMUSCULAR | Status: AC
  Administered 2015-12-01: 10 mg

## 2015-12-01 NOTE — Progress Notes (Signed)
   Subjective:    Patient ID: [name], male    DOB: 1961-04-22, 55 y.o.   MRN: [id number]  HPI Patient presents with bilateral foot pain. On Right foot-plantar forefoot. Pt stated, "has numbness in all toes"; Left foot-great toe-joint. Pt stated, "Joint hurts when bend big toe"; x5 months.  Patient stated, "Broke right foot 1 1/2 years ago".  Review of Systems  All other systems reviewed and are negative.      Objective:   Physical Exam        Assessment & Plan:

## 2015-12-03 NOTE — Progress Notes (Signed)
Subjective:     Patient ID: [name], male   DOB: Feb 16, 1961, 55 y.o.   MRN: [id number]  HPI patient states I'm getting some shooting pains in my right foot with radiating-like discomfort and I'm getting some pain around my big toe joint left that makes it hard to walk comfortably. Patient states that the left is been present for about a year and the right has been bothering her more recently   Review of Systems  All other systems reviewed and are negative.      Objective:   Physical Exam  Constitutional: He is oriented to person, place, and time.  Cardiovascular: Intact distal pulses.   Musculoskeletal: Normal range of motion.  Neurological: He is oriented to person, place, and time.  Skin: Skin is warm.  Nursing note and vitals reviewed.  neurovascular status intact muscle strength adequate range of motion within normal limits with patient noted to have discomfort third intermetatarsal space right with radiating discomfort and a palpable small mass formation with inflammation and reduced range of motion first MPJ left with pain around the joint surface. 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 Today 1 ahead discussed and explained the conditions and the possibilities for treatment. We're going with conservative treatment and at this time for the right foot I did a sterile prep and then injected neuro lysis consisting of purified alcohol solution to attempt to sclerose the nerve and also see the response. For the left I did inject around the first MPJ 3 mg Kenalog 5 mg Xylocaine and discussed long-term orthotics to try to control functional hallux limitus condition.  X-ray report indicate spurring around the first MPJ left with mild narrowing of the joint and also no indication stress fracture  arthritis

## 2015-12-15 ENCOUNTER — Ambulatory Visit (INDEPENDENT_AMBULATORY_CARE_PROVIDER_SITE_OTHER): Payer: BC Managed Care – PPO | Admitting: Podiatry

## 2015-12-15 ENCOUNTER — Encounter: Payer: Self-pay | Admitting: Podiatry

## 2015-12-15 VITALS — BP 148/111 | HR 101 | Resp 16

## 2015-12-15 DIAGNOSIS — G5761 Lesion of plantar nerve, right lower limb: Secondary | ICD-10-CM

## 2015-12-15 DIAGNOSIS — M779 Enthesopathy, unspecified: Secondary | ICD-10-CM

## 2015-12-15 DIAGNOSIS — D3610 Benign neoplasm of peripheral nerves and autonomic nervous system, unspecified: Secondary | ICD-10-CM

## 2015-12-16 NOTE — Progress Notes (Signed)
Subjective:     Patient ID: [name], male   DOB: Jul 19, 1961, 55 y.o.   MRN: [id number]  HPI patient presents stating I am getting my back checked and I do still have numbness in my feet but it seems that this helped some of the burning pain   Review of Systems     Objective:   Physical Exam Neurovascular status unchanged with patient having forefoot irritation bilateral with continued radiating discomfort third interspace right foot    Assessment:     Appears to be 2 separate problems here with one of them being the possibility for some kind of neuropathic episode versus probability of some kind of neuroma or impingement occurring    Plan:     I explained the differences between the conditions and he will continue to pursue issues with his back and today I did a sterile prep of the forefoot right and injected directly into the digital nerve with 4% purified alcohol Marcaine solution and reappoint again in 4 weeks

## 2016-01-05 ENCOUNTER — Ambulatory Visit: Payer: BC Managed Care – PPO | Admitting: Podiatry

## 2016-01-13 ENCOUNTER — Other Ambulatory Visit: Payer: Self-pay | Admitting: Family Medicine

## 2016-01-30 ENCOUNTER — Other Ambulatory Visit: Payer: Self-pay | Admitting: Orthopaedic Surgery

## 2016-01-30 DIAGNOSIS — M542 Cervicalgia: Secondary | ICD-10-CM

## 2016-02-12 ENCOUNTER — Other Ambulatory Visit: Payer: Self-pay | Admitting: Internal Medicine

## 2016-02-16 ENCOUNTER — Other Ambulatory Visit: Payer: BC Managed Care – PPO

## 2016-02-18 ENCOUNTER — Ambulatory Visit
Admission: RE | Admit: 2016-02-18 | Discharge: 2016-02-18 | Disposition: A | Discharge status: Home / Self Care | Payer: BC Managed Care – PPO | Source: Ambulatory Visit | Attending: Orthopaedic Surgery | Admitting: Orthopaedic Surgery

## 2016-02-18 DIAGNOSIS — M542 Cervicalgia: Secondary | ICD-10-CM

## 2016-02-24 ENCOUNTER — Telehealth: Payer: Self-pay | Admitting: Family Medicine

## 2016-02-24 NOTE — Telephone Encounter (Signed)
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 His got bit by one yesterday and hand is swelled. Two times the size of his regular hand. Nurse Assessment Nurse: [name], RN, Miranda Date/Time (Eastern Time): 02/24/2016 9:18:52 AM Confirm and document reason for call. If symptomatic, describe symptoms. You must click the next button to save text entered. ---Caller states he was bitten on his right hand by a fire ant yesterday. His hand is swollen. Has the patient traveled out of the country within the last 30 days? ---Not Applicable Does the patient have any new or worsening symptoms? ---Yes Will a triage be completed? ---Yes Related visit to physician within the last 2 weeks? ---No Does the PT have any chronic conditions? (i.e. diabetes, asthma, etc.) ---Unknown Is this a behavioral health or substance abuse call? ---No Guidelines Guideline Title Affirmed Question Affirmed Notes Insect Bite Painful insect bite (all triage questions negative) Final Disposition User Gray, RN, Miranda Disagree/Comply: Leta Baptist

## 2016-02-24 NOTE — Telephone Encounter (Signed)
FYI.  Home Care Advise Given

## 2016-03-11 DIAGNOSIS — F322 Major depressive disorder, single episode, severe without psychotic features: Secondary | ICD-10-CM | POA: Diagnosis not present

## 2016-04-15 ENCOUNTER — Ambulatory Visit: Payer: BC Managed Care – PPO | Admitting: Podiatry

## 2016-04-15 ENCOUNTER — Ambulatory Visit: Payer: Medicare Other | Admitting: Urology

## 2016-04-19 ENCOUNTER — Ambulatory Visit (INDEPENDENT_AMBULATORY_CARE_PROVIDER_SITE_OTHER): Payer: BC Managed Care – PPO | Admitting: Podiatry

## 2016-04-19 ENCOUNTER — Encounter: Payer: Self-pay | Admitting: Podiatry

## 2016-04-19 DIAGNOSIS — M779 Enthesopathy, unspecified: Secondary | ICD-10-CM

## 2016-04-19 DIAGNOSIS — D3610 Benign neoplasm of peripheral nerves and autonomic nervous system, unspecified: Secondary | ICD-10-CM

## 2016-04-19 DIAGNOSIS — M205X2 Other deformities of toe(s) (acquired), left foot: Secondary | ICD-10-CM

## 2016-04-19 MED ORDER — TRIAMCINOLONE ACETONIDE 10 MG/ML IJ SUSP
10.0000 mg | Freq: Once | INTRAMUSCULAR | Status: AC
  Administered 2016-04-19: 10 mg

## 2016-04-21 NOTE — Progress Notes (Signed)
Subjective:     Patient ID: [name], male   DOB: 22-May-1961, 55 y.o.   MRN: [id number]  Foot Pain   Patient presents stating I still gets some numbness but I have developed discomfort in his first MPJ joint left that seem to be improving but now is bothering me again   Review of Systems     Objective:   Physical Exam Neurovascular status intact muscle strength adequate range of motion within normal limits with patient found to have continued discomfort first MPJ left around the lateral side and low-grade numbness in both feet with no apparent reason why. 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 I then went ahead and scanned for custom orthotics to reduce stress against the first MPJ hallux and to see if we can avoid surgery on this patient

## 2016-04-29 ENCOUNTER — Ambulatory Visit: Payer: BC Managed Care – PPO | Admitting: Podiatry

## 2016-05-11 ENCOUNTER — Ambulatory Visit: Payer: BC Managed Care – PPO | Admitting: *Deleted

## 2016-05-11 DIAGNOSIS — M779 Enthesopathy, unspecified: Secondary | ICD-10-CM

## 2016-05-11 NOTE — Patient Instructions (Signed)

## 2016-05-11 NOTE — Progress Notes (Signed)
Patient ID: [name], male   DOB: 02-26-1961, 55 y.o.   MRN: [id number]  Patient presents for orthotic pick up.  Verbal and written break in and wear instructions given.  Patient will follow up in 4 weeks if symptoms worsen or fail to improve.

## 2016-05-13 ENCOUNTER — Ambulatory Visit (INDEPENDENT_AMBULATORY_CARE_PROVIDER_SITE_OTHER): Payer: BC Managed Care – PPO | Admitting: Podiatry

## 2016-05-13 DIAGNOSIS — D3610 Benign neoplasm of peripheral nerves and autonomic nervous system, unspecified: Secondary | ICD-10-CM

## 2016-05-13 DIAGNOSIS — M779 Enthesopathy, unspecified: Secondary | ICD-10-CM

## 2016-05-13 NOTE — Patient Instructions (Signed)

## 2016-05-14 ENCOUNTER — Telehealth: Payer: Self-pay | Admitting: *Deleted

## 2016-05-14 DIAGNOSIS — M779 Enthesopathy, unspecified: Secondary | ICD-10-CM | POA: Diagnosis not present

## 2016-05-14 MED ORDER — TRIAMCINOLONE ACETONIDE 10 MG/ML IJ SUSP
10.0000 mg | Freq: Once | INTRAMUSCULAR | Status: AC
  Administered 2016-05-14: 10 mg

## 2016-05-14 NOTE — Telephone Encounter (Signed)
"  I'm calling for my husband [name].  We need to schedule an appointment to get his foot operated on.  Give me a call I'd appreciate it"

## 2016-05-14 NOTE — Telephone Encounter (Signed)
"  I saw Dr. Paulla Dolly yesterday.  We need to schedule me a surgery for November 21.  I don't know when your will be able to get me because I will be out in the field

## 2016-05-14 NOTE — Progress Notes (Signed)
Subjective:     Patient ID: [name], male   DOB: 1961-05-01, 55 y.o.   MRN: [id number]  HPI patient presents stating he's having a lot of pain between the third and fourth toes on his right foot and it is gradually becoming more symptomatic with walking. 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 Patient is noted guarantee as far as success of surgery and understands everything as outlined is willing to accept risk of procedure understanding it may not solve his problem. Patient signs consent form after extensive review and is scheduled for outpatient surgery and was encouraged to call with any questions prior. I did go ahead and inject with steroid today to try to reduce inflammatory component as he cannot do it for a little while with 3 mg Dexon some Kenalog 5 mg Xylocaine

## 2016-05-17 ENCOUNTER — Encounter: Payer: Self-pay | Admitting: *Deleted

## 2016-05-17 NOTE — Telephone Encounter (Signed)
"  Please give me a call back.  I need to schedule a surgery as soon as possible. Dr. Paulla Dolly is my doctor.  I saw him Thursday."  "I talked with [employer].  They said if you go ahead and send the paperwork.  Tell the doctor to write a note that it is urgently needed it needs to be done tomorrow.  There is a possibility that they can have it approved by the end of the day.  Phone number is [phone] that is prior authorization center.  Call me at the end of the day and let me know something.   You can reach me at the same number before 5 pm and after 5 pm call home number.    I faxed notes and prior authorization request note to [employer].

## 2016-05-17 NOTE — Telephone Encounter (Signed)
I'm calling to let you know you are all set for surgery on tomorrow.  Your procedure does not require prior authorization.  "Great, what time do I have to be there?"  Someone from the surgical center will give you a call sometime today with an arrival time.

## 2016-05-18 ENCOUNTER — Telehealth: Payer: Self-pay | Admitting: *Deleted

## 2016-05-18 ENCOUNTER — Encounter: Payer: Self-pay | Admitting: Podiatry

## 2016-05-18 DIAGNOSIS — D3610 Benign neoplasm of peripheral nerves and autonomic nervous system, unspecified: Secondary | ICD-10-CM

## 2016-05-18 DIAGNOSIS — G5761 Lesion of plantar nerve, right lower limb: Secondary | ICD-10-CM | POA: Diagnosis not present

## 2016-05-18 DIAGNOSIS — Z9889 Other specified postprocedural states: Secondary | ICD-10-CM

## 2016-05-18 NOTE — Telephone Encounter (Addendum)
Pt called at 4:35pm states he took off his boot and the bottom of his foot was absolutely soaked with blood, what should he do.  I asked the pt if he went somewhere after the surgery and he said to lunch.  I told pt he could remove the boot, open ended sock, and ace only, he could reinforce the gauze or just allow the area to dry and replace with a clean ace wrap. I told pt not to have the foot below his heart or weight bear more than 15 mins/hour, rest, ice and elevate. 05/25/2016-Pt states he needs something to help him transport around campus.  I ordered Knee Scooter from [employer]. 08/11/2016-Pt states he has spoke to a Engineer, structural and she said he should talk to our office.left message requesting pt leave a message with his concern. Pt states he has an area on the bottom of the foot that feels like an electric shock in shoes and even when rubs finger over the bottom of the foot, pt states he has orthotics he can't wear and has had to change his lifestyle and job after the surgery. I told pt all I could recommend was an appt and if he didn't want an appt he should wear wider shoes. I checked with our office manager and she said pt was still under the globe surgery 90 day period and should be covered as his other POV. Pt agreed to an appt and I transferred to schedulers.

## 2016-05-19 ENCOUNTER — Ambulatory Visit: Payer: BC Managed Care – PPO | Admitting: Podiatry

## 2016-05-19 ENCOUNTER — Ambulatory Visit (INDEPENDENT_AMBULATORY_CARE_PROVIDER_SITE_OTHER): Payer: BC Managed Care – PPO

## 2016-05-19 VITALS — Temp 97.9°F

## 2016-05-19 DIAGNOSIS — Z9889 Other specified postprocedural states: Secondary | ICD-10-CM

## 2016-05-19 DIAGNOSIS — D3610 Benign neoplasm of peripheral nerves and autonomic nervous system, unspecified: Secondary | ICD-10-CM

## 2016-05-19 NOTE — Progress Notes (Signed)
PT presents stating that his post operative dressing needs to be changed. Noted dried brown blood on dressing. Removed a few layer of gauze, added sterile gauze and re-wrapped foot. Advised to look for s/s of bleeding

## 2016-05-24 NOTE — Progress Notes (Signed)
DOS 05/18/2016 Excision soft tissue mass (suspect neuroma) 3rd innerspace right foot

## 2016-05-27 ENCOUNTER — Ambulatory Visit (INDEPENDENT_AMBULATORY_CARE_PROVIDER_SITE_OTHER): Payer: BC Managed Care – PPO | Admitting: Podiatry

## 2016-05-27 ENCOUNTER — Encounter: Payer: Self-pay | Admitting: Podiatry

## 2016-05-27 ENCOUNTER — Ambulatory Visit (INDEPENDENT_AMBULATORY_CARE_PROVIDER_SITE_OTHER): Payer: BC Managed Care – PPO

## 2016-05-27 DIAGNOSIS — D3610 Benign neoplasm of peripheral nerves and autonomic nervous system, unspecified: Secondary | ICD-10-CM

## 2016-05-27 DIAGNOSIS — M779 Enthesopathy, unspecified: Secondary | ICD-10-CM

## 2016-05-27 DIAGNOSIS — Z9889 Other specified postprocedural states: Secondary | ICD-10-CM

## 2016-05-28 NOTE — Progress Notes (Signed)
Subjective:     Patient ID: [name], male   DOB: 1961/04/22, 55 y.o.   MRN: [id number]  HPI patient presents stating I'm still getting some pain in my foot but it is slowly seems to get better but I'm just worried about the bone structure   Review of Systems     Objective:   Physical Exam Neurovascular status intact negative Homans sign was noted with patient's right third interspace healing well post neuroma with minimal swelling but I am concerned about the amount of pain he is experiencing    Assessment:     Slight pain out of proportion to injury currently    Plan:     As precautionary measure went ahead today check x-ray and then reapplied sterile dressing instructed on continued elevation gradual increase in activity levels and placed on oral anti-inflammatories. Reappoint in 4 weeks or earlier if needed  X-rays were negative for signs of fracture or bone injury

## 2016-06-10 ENCOUNTER — Ambulatory Visit (INDEPENDENT_AMBULATORY_CARE_PROVIDER_SITE_OTHER): Payer: BC Managed Care – PPO | Admitting: Podiatry

## 2016-06-10 DIAGNOSIS — Z9889 Other specified postprocedural states: Secondary | ICD-10-CM | POA: Diagnosis not present

## 2016-06-10 DIAGNOSIS — R600 Localized edema: Secondary | ICD-10-CM

## 2016-06-21 ENCOUNTER — Telehealth: Payer: Self-pay

## 2016-06-21 NOTE — Telephone Encounter (Signed)
LVM for pt to call back, advisign him to ice, elevate, walk as tolerated, he could come to the office and pick up a compression anklet to help with swelling, try a good supportive mesh athletic tennis when ambulating, can also come to the office and apply an unna boot if desired to help with swelling

## 2016-06-22 ENCOUNTER — Telehealth: Payer: Self-pay

## 2016-06-22 NOTE — Telephone Encounter (Signed)
LVM in forming patient again of options for pain reduction in his foot, also informing him of post op milestones and expectations. Advised him to come into office if he desired application of unna boot, also for dispensing of compression anklet. Any acute symptoms or worsening of symptoms will require an appt with Dr Paulla Dolly

## 2016-06-29 ENCOUNTER — Telehealth: Payer: Self-pay | Admitting: Family Medicine

## 2016-06-29 DIAGNOSIS — N200 Calculus of kidney: Secondary | ICD-10-CM

## 2016-06-29 NOTE — Telephone Encounter (Signed)
° ° °  Pt request refill of the following:   acyclovir (ZOVIRAX) 800 MG tablet   Phamacy:  [facility]

## 2016-06-30 MED ORDER — ACYCLOVIR 800 MG PO TABS: ORAL_TABLET | ORAL | 3 refills | Status: DC

## 2016-06-30 MED ORDER — ACYCLOVIR 800 MG PO TABS: ORAL_TABLET | ORAL | 1 refills | Status: DC

## 2016-06-30 NOTE — Telephone Encounter (Signed)
I sent script e-scribe to below pharmacy.  

## 2016-07-12 ENCOUNTER — Encounter: Payer: Self-pay | Admitting: Family Medicine

## 2016-07-12 ENCOUNTER — Ambulatory Visit (INDEPENDENT_AMBULATORY_CARE_PROVIDER_SITE_OTHER): Payer: BC Managed Care – PPO | Admitting: Family Medicine

## 2016-07-12 VITALS — BP 135/99 | HR 113 | Temp 98.2°F

## 2016-07-12 DIAGNOSIS — J209 Acute bronchitis, unspecified: Secondary | ICD-10-CM

## 2016-07-12 MED ORDER — AZITHROMYCIN 250 MG PO TABS: ORAL_TABLET | ORAL | 0 refills | Status: DC

## 2016-07-12 NOTE — Progress Notes (Signed)
Pre visit review using our clinic review tool, if applicable. No additional management support is needed unless otherwise documented below in the visit note. Pt unable to stand and weigh.   

## 2016-07-12 NOTE — Progress Notes (Signed)
   Subjective:    Patient ID: [name], male    DOB: Feb 14, 1961, 55 y.o.   MRN: [id number]  HPI Here for 10 days of PND, stuffy head, chest congestion and coughing up yellow sputum. No fever.    Review of Systems  Constitutional: Negative.   HENT: Positive for congestion, postnasal drip and sinus pressure. Negative for sore throat.   Eyes: Negative.   Respiratory: Positive for cough and chest tightness. Negative for shortness of breath and wheezing.        Objective:   Physical Exam  Constitutional: He appears well-developed and well-nourished.  HENT:  Right Ear: External ear normal.  Left Ear: External ear normal.  Nose: Nose normal.  Mouth/Throat: Oropharynx is clear and moist.  Eyes: Conjunctivae are normal.  Neck: No thyromegaly present.  Pulmonary/Chest: Effort normal. No respiratory distress. He has no wheezes. He has no rales.  Scattered rhonchi   Lymphadenopathy:    He has no cervical adenopathy.          Assessment & Plan:  Bronchitis, treat with a Zpack. Use Delsym prn. Written out of work on 07-06-16, 07-09-16, and 07-12-16.  [name], MD

## 2016-07-17 ENCOUNTER — Encounter: Payer: Self-pay | Admitting: Family Medicine

## 2016-07-17 ENCOUNTER — Ambulatory Visit (INDEPENDENT_AMBULATORY_CARE_PROVIDER_SITE_OTHER): Payer: BC Managed Care – PPO | Admitting: Family Medicine

## 2016-07-17 DIAGNOSIS — J019 Acute sinusitis, unspecified: Secondary | ICD-10-CM

## 2016-07-17 DIAGNOSIS — I10 Essential (primary) hypertension: Secondary | ICD-10-CM | POA: Diagnosis not present

## 2016-07-17 HISTORY — DX: Acute sinusitis, unspecified: J01.90

## 2016-07-17 MED ORDER — HYDROCODONE-HOMATROPINE 5-1.5 MG/5ML PO SYRP: 5.0000 mL | ORAL_SOLUTION | Freq: Three times a day (TID) | ORAL | 0 refills | Status: DC | PRN

## 2016-07-17 MED ORDER — DOXYCYCLINE HYCLATE 100 MG PO TABS: 100.0000 mg | ORAL_TABLET | Freq: Two times a day (BID) | ORAL | 0 refills | Status: DC

## 2016-07-17 NOTE — Assessment & Plan Note (Addendum)
Encouraged increased rest and hydration, add probiotics, zinc such as Coldeze or Xicam. Treat fevers as needed. Recent course of Azithromycin and was improving then in past 24 hours symptoms worsened again. He is noting fatigue, malaise, myalgias, cough, PND, rhinorrhea, sore throat today. Has tried Nyquil, Dayquil and Flonase with minimal improvement. Will start new antibiotic, Mucinex but encouraged to avoid all products with Sudafed and DM due to BP and tachycardia

## 2016-07-17 NOTE — Patient Instructions (Signed)
Aged or Black garlic, Vitamin C [id number] to 1000 mg, Zinc, Elderberry, probiotics, [email or web address] Sinusitis, Adult Sinusitis is redness, soreness, and inflammation of the paranasal sinuses. Paranasal sinuses are air pockets within the bones of your face. They are located beneath your eyes, in the middle of your forehead, and above your eyes. In healthy paranasal sinuses, mucus is able to drain out, and air is able to circulate through them by way of your nose. However, when your paranasal sinuses are inflamed, mucus and air can become trapped. This can allow bacteria and other germs to grow and cause infection. Sinusitis can develop quickly and last only a short time (acute) or continue over a long period (chronic). Sinusitis that lasts for more than 12 weeks is considered chronic. CAUSES Causes of sinusitis include:  Allergies.  Structural abnormalities, such as displacement of the cartilage that separates your nostrils (deviated septum), which can decrease the air flow through your nose and sinuses and affect sinus drainage.  Functional abnormalities, such as when the small hairs (cilia) that line your sinuses and help remove mucus do not work properly or are not present. SIGNS AND SYMPTOMS Symptoms of acute and chronic sinusitis are the same. The primary symptoms are pain and pressure around the affected sinuses. Other symptoms include:  Upper toothache.  Earache.  Headache.  Bad breath.  Decreased sense of smell and taste.  A cough, which worsens when you are lying flat.  Fatigue.  Fever.  Thick drainage from your nose, which often is green and may contain pus (purulent).  Swelling and warmth over the affected sinuses. DIAGNOSIS Your health care provider will perform a physical exam. During your exam, your health care provider may perform any of the following to help determine if you have acute sinusitis or chronic sinusitis:  Look in your nose for signs of  abnormal growths in your nostrils (nasal polyps).  Tap over the affected sinus to check for signs of infection.  View the inside of your sinuses using an imaging device that has a light attached (endoscope). If your health care provider suspects that you have chronic sinusitis, one or more of the following tests may be recommended:  Allergy tests.  Nasal culture. A sample of mucus is taken from your nose, sent to a lab, and screened for bacteria.  Nasal cytology. A sample of mucus is taken from your nose and examined by your health care provider to determine if your sinusitis is related to an allergy. TREATMENT Most cases of acute sinusitis are related to a viral infection and will resolve on their own within 10 days. Sometimes, medicines are prescribed to help relieve symptoms of both acute and chronic sinusitis. These may include pain medicines, decongestants, nasal steroid sprays, or saline sprays. However, for sinusitis related to a bacterial infection, your health care provider will prescribe antibiotic medicines. These are medicines that will help kill the bacteria causing the infection. Rarely, sinusitis is caused by a fungal infection. In these cases, your health care provider will prescribe antifungal medicine. For some cases of chronic sinusitis, surgery is needed. Generally, these are cases in which sinusitis recurs more than 3 times per year, despite other treatments. HOME CARE INSTRUCTIONS  Drink plenty of water. Water helps thin the mucus so your sinuses can drain more easily.  Use a humidifier.  Inhale steam 3-4 times a day (for example, sit in the bathroom with the shower running).  Apply a warm, moist washcloth to your face 3-4 times a  day, or as directed by your health care provider.  Use saline nasal sprays to help moisten and clean your sinuses.  Take medicines only as directed by your health care provider.  If you were prescribed either an antibiotic or antifungal  medicine, finish it all even if you start to feel better. SEEK IMMEDIATE MEDICAL CARE IF:  You have increasing pain or severe headaches.  You have nausea, vomiting, or drowsiness.  You have swelling around your face.  You have vision problems.  You have a stiff neck.  You have difficulty breathing.   This information is not intended to replace advice given to you by your health care provider. Make sure you discuss any questions you have with your health care provider.   Document Released: 09/13/2005 Document Revised: 10/04/2014 Document Reviewed: 09/28/2011 Elsevier Interactive Patient Education [employer].

## 2016-07-17 NOTE — Progress Notes (Signed)
Patient ID: [name], male   DOB: [birth year], 55 y.o.   MRN: [id number]   Subjective:    Patient ID: [name], male    DOB: [birth year], 55 y.o.   MRN: [id number]  Chief Complaint  Patient presents with  . Follow-up    last seen 07/12/16--Rx Zpack was given--took last dose this morning.Marland KitchenMarland KitchenSx worsened yesterday....productive cough yellow/blood tinged sputum....pt has tried OTC Flonase, Claritin D, Zyrtec and Nyquil with some relief....    HPI Patient is in today for evaluation of recurrent respiratory symptoms. Encouraged increased rest and hydration, add probiotics, zinc such as Coldeze or Xicam. Treat fevers as needed. Recent course of Azithromycin and was improving then in past 24 hours symptoms worsened again. He is noting fatigue, malaise, myalgias, cough, PND, rhinorrhea, sore throat today. Has tried Nyquil, Dayquil and Flonase with minimal improvement.   Past Medical History:  Diagnosis Date  . ADHD (attention deficit hyperactivity disorder), inattentive type    sees Dr. Celesta Aver in [place], [place]   . Anxiety    sees Dr. Celesta Aver in [place], [place]   . Arthritis   . Benign enlargement of prostate    sees [facility]   . Carpal tunnel syndrome on left    sees Dr. Eugene Garnet at [facility]   . Chronic pain    sees Dr. Nicholaus Bloom (meds) and [facility] (accupuncture)   . Depression   . Functional impotence   . Hypogonadism in male    sees Larene Beach (a [place]) at [facility]   . Insomnia   . Kidney stones   . Sinusitis, acute 07/17/2016  . Urticaria due to cold     Past Surgical History:  Procedure Laterality Date  . APPENDECTOMY    . CERVICAL FUSION    . CERVICAL LAMINECTOMY    . LUMBAR DISC SURGERY     x 3    Family History  Problem Relation Age of Onset  . Heart disease Other   . Diabetes Other   . Hyperlipidemia Other   . Prostate cancer Neg Hx   . Hypertension Sister   . Kidney disease Neg Hx     Social History    Social History  . Marital status: Married    Spouse name: N/A  . Number of children: N/A  . Years of education: N/A   Occupational History  . Not on file.   Social History Main Topics  . Smoking status: Former Research scientist (life sciences)  . Smokeless tobacco: Never Used     Comment: quit 35 years  . Alcohol use 0.0 oz/week     Comment: occasional  . Drug use: No  . Sexual activity: Yes    Birth control/ protection: Condom   Other Topics Concern  . Not on file   Social History Narrative  . No narrative on file    Outpatient Medications Prior to Visit  Medication Sig Dispense Refill  . acyclovir (ZOVIRAX) 800 MG tablet TAKE 1 TABLET BY MOUTH 2 TIMES DAILY. 60 tablet 1  . acyclovir ointment (ZOVIRAX) 5 % Apply 1 application topically every 3 (three) hours. 30 g 3  . amLODipine (NORVASC) 10 MG tablet Take 1 tablet (10 mg total) by mouth daily. 90 tablet 3  . amphetamine-dextroamphetamine (ADDERALL XR) 10 MG 24 hr capsule Take 5 mg by mouth every morning. Take 1/4 tablet as needed    . cetirizine (ZYRTEC) 10 MG tablet Take 10 mg by mouth daily.      . diazepam (VALIUM)  10 MG tablet Take 10 mg by mouth every 6 (six) hours as needed.      . diclofenac sodium (VOLTAREN) 1 % GEL Apply 1 application topically as needed.      Marland Kitchen EPINEPHrine (EPIPEN 2-PAK) 0.3 mg/0.3 mL IJ SOAJ injection Inject 0.3 mLs (0.3 mg total) into the muscle once. 1 Device 5  . fentaNYL (DURAGESIC - DOSED MCG/HR) 50 MCG/HR Place 50 mcg onto the skin every other day.    . fluticasone (FLONASE) 50 MCG/ACT nasal spray USE 2 SPRAYS IN EACH NOSTRIL DAILY 16 g 6  . HYDROcodone-acetaminophen (NORCO) 10-325 MG tablet Take 1 tablet by mouth every 6 (six) hours as needed (1 tablet every 4 to 6 hours PRN).    Marland Kitchen lactulose (CHRONULAC) 10 GM/15ML solution     . losartan-hydrochlorothiazide (HYZAAR) 50-12.5 MG tablet Take 1 tablet by mouth daily. 90 tablet 3  . OVER THE COUNTER MEDICATION Reported on 10/30/2015    . Oxycodone HCl 10 MG TABS Take  5 mg by mouth every 12 (twelve) hours as needed. for pain  0  . potassium citrate (UROCIT-K) 10 MEQ (1080 MG) SR tablet Take 1 tablet (10 mEq total) by mouth daily. 90 tablet 3  . pramoxine (PROCTOFOAM) 1 % foam Place 1 application rectally 3 (three) times daily as needed for itching. 15 g 0  . PROCTOFOAM HC rectal foam USE AS DIRECTED 3 TIMES A DAY AS NEEDED 10 g 11  . promethazine (PHENERGAN) 25 MG tablet Reported on 10/30/2015    . traZODone (DESYREL) 100 MG tablet Take 50 mg by mouth at bedtime.     . triamcinolone cream (KENALOG) 0.1 % Apply 1 application topically 2 (two) times daily. 30 g 0  . azithromycin (ZITHROMAX) 250 MG tablet As directed 6 tablet 0   No facility-administered medications prior to visit.     Allergies  Allergen Reactions  . Codeine     REACTION: ITCHING  . Dulcolax Stool Softener [Dss]     swelling  . Ketorolac Tromethamine Other (See Comments)    REACTION: SEVERE VOMMITING  . Lisinopril     Cough & chest tightness    Review of Systems  Constitutional: Positive for malaise/fatigue. Negative for fever.  HENT: Positive for congestion and sore throat.   Eyes: Negative for blurred vision.  Respiratory: Positive for cough and sputum production. Negative for shortness of breath.   Cardiovascular: Negative for chest pain, palpitations and leg swelling.  Gastrointestinal: Negative for abdominal pain, blood in stool and nausea.  Genitourinary: Negative for dysuria and frequency.  Musculoskeletal: Positive for myalgias. Negative for falls.  Skin: Negative for rash.  Neurological: Negative for dizziness, loss of consciousness and headaches.  Endo/Heme/Allergies: Negative for environmental allergies.  Psychiatric/Behavioral: Negative for depression. The patient is not nervous/anxious.        Objective:    Physical Exam  Constitutional: He is oriented to person, place, and time. He appears well-developed and well-nourished. No distress.  HENT:  Head:  Normocephalic and atraumatic.  Nose: Nose normal.  Oropharynx erythematous.   Eyes: Right eye exhibits no discharge. Left eye exhibits no discharge.  Neck: Normal range of motion. Neck supple.  Cardiovascular: Normal rate and regular rhythm.   No murmur heard. Pulmonary/Chest: Effort normal and breath sounds normal.  Abdominal: Soft. Bowel sounds are normal. There is no tenderness.  Musculoskeletal: He exhibits no edema.  Neurological: He is alert and oriented to person, place, and time.  Skin: Skin is warm and dry.  Psychiatric: He has a normal mood and affect.  Nursing note and vitals reviewed.   BP (!) 136/96   Pulse (!) 110   Temp 98.1 F (36.7 C) (Oral)   Wt 172 lb (78 kg)   SpO2 96%   BMI 25.77 kg/m  Wt Readings from Last 3 Encounters:  07/17/16 172 lb (78 kg)  12/01/15 163 lb (73.9 kg)  10/30/15 164 lb (74.4 kg)     Lab Results  Component Value Date   WBC 7.9 10/15/2015   HGB 16.0 10/15/2015   HCT 48.7 10/15/2015   PLT 214.0 10/15/2015   GLUCOSE 105 (H) 10/15/2015   CHOL 235 (H) 10/15/2015   TRIG 96.0 10/15/2015   HDL 51.80 10/15/2015   LDLDIRECT 179.9 12/18/2012   LDLCALC 164 (H) 10/15/2015   ALT 42 10/15/2015   AST 30 10/15/2015   NA 138 10/15/2015   K 3.9 10/15/2015   CL 99 10/15/2015   CREATININE 1.10 10/15/2015   BUN 17 10/15/2015   CO2 30 10/15/2015   TSH 2.15 10/15/2015   PSA 2.44 10/15/2015   HGBA1C 5.8 12/02/2014   MICROALBUR 4.5 (H) 12/02/2014    Lab Results  Component Value Date   TSH 2.15 10/15/2015   Lab Results  Component Value Date   WBC 7.9 10/15/2015   HGB 16.0 10/15/2015   HCT 48.7 10/15/2015   MCV 91.4 10/15/2015   PLT 214.0 10/15/2015   Lab Results  Component Value Date   NA 138 10/15/2015   K 3.9 10/15/2015   CO2 30 10/15/2015   GLUCOSE 105 (H) 10/15/2015   BUN 17 10/15/2015   CREATININE 1.10 10/15/2015   BILITOT 0.7 10/15/2015   ALKPHOS 69 10/15/2015   AST 30 10/15/2015   ALT 42 10/15/2015   PROT 7.1  10/15/2015   ALBUMIN 4.4 10/15/2015   CALCIUM 9.3 10/15/2015   GFR 74.31 10/15/2015   Lab Results  Component Value Date   CHOL 235 (H) 10/15/2015   Lab Results  Component Value Date   HDL 51.80 10/15/2015   Lab Results  Component Value Date   LDLCALC 164 (H) 10/15/2015   Lab Results  Component Value Date   TRIG 96.0 10/15/2015   Lab Results  Component Value Date   CHOLHDL 5 10/15/2015   Lab Results  Component Value Date   HGBA1C 5.8 12/02/2014       Assessment & Plan:   Problem List Items Addressed This Visit    Essential hypertension, benign    Mildly elevated with acute illness, no changes to meds. Encouraged heart healthy diet such as the DASH diet and exercise as tolerated.       Sinusitis, acute    Encouraged increased rest and hydration, add probiotics, zinc such as Coldeze or Xicam. Treat fevers as needed. Recent course of Azithromycin and was improving then in past 24 hours symptoms worsened again. He is noting fatigue, malaise, myalgias, cough, PND, rhinorrhea, sore throat today. Has tried Nyquil, Dayquil and Flonase with minimal improvement. Will start new antibiotic, Mucinex but encouraged to avoid all products with Sudafed and DM due to BP and tachycardia      Relevant Medications   doxycycline (VIBRA-TABS) 100 MG tablet   HYDROcodone-homatropine (HYCODAN) 5-1.5 MG/5ML syrup    Other Visit Diagnoses   None.     I have discontinued Mr. Herpin azithromycin. I am also having him start on doxycycline and HYDROcodone-homatropine. Additionally, I am having him maintain his diazepam, traZODone, diclofenac sodium, cetirizine, amphetamine-dextroamphetamine, pramoxine,  OVER THE COUNTER MEDICATION, fentaNYL, promethazine, Oxycodone HCl, lactulose, triamcinolone cream, amLODipine, losartan-hydrochlorothiazide, EPINEPHrine, potassium citrate, acyclovir ointment, fluticasone, PROCTOFOAM HC, HYDROcodone-acetaminophen, and acyclovir.  Meds ordered this encounter    Medications  . doxycycline (VIBRA-TABS) 100 MG tablet    Sig: Take 1 tablet (100 mg total) by mouth 2 (two) times daily.    Dispense:  20 tablet    Refill:  0  . HYDROcodone-homatropine (HYCODAN) 5-1.5 MG/5ML syrup    Sig: Take 5 mLs by mouth every 8 (eight) hours as needed for cough.    Dispense:  120 mL    Refill:  0     Penni Homans, MD

## 2016-07-17 NOTE — Assessment & Plan Note (Signed)
Mildly elevated with acute illness, no changes to meds. Encouraged heart healthy diet such as the DASH diet and exercise as tolerated.

## 2016-07-19 ENCOUNTER — Telehealth: Payer: Self-pay

## 2016-07-19 ENCOUNTER — Telehealth: Payer: Self-pay | Admitting: Family Medicine

## 2016-07-19 NOTE — Telephone Encounter (Signed)
Pt state the orthopaedic surgeon wanted to know if it is okay for the pt to take prednisone along with the doxycycline that was prescribe from urgent care.

## 2016-07-19 NOTE — Telephone Encounter (Signed)
Pt seen on 07/17/2016 at [facility].   Patient Name: [name] Gender: Male DOB: [birth year] Age: 55 Y 2 M 1 D Return Phone Number: [phone] (Primary), [phone] (Secondary) Address: City/State/Zip: [place] Client [facility] Night - Client Client Site [facility] - Night Physician Alysia Penna - MD Contact Type Call Who Is Calling Patient / Member / Family / Caregiver Call Type Triage / Clinical Relationship To Patient Self Return Phone Number [phone] (Primary) Chief Complaint Cough Reason for Call Symptomatic / Request for [place] states saw Dr Sarajane Jews on Monday, zpack was prescribed, dx w/bronchitis, took last one today and now is getting back to where he was...coughing / drainage going down his throat. PreDisposition InappropriateToAsk Translation No Nurse Assessment Nurse: Richardson Landry, RN, Aldona Bar Date/Time (Eastern Time): 07/17/2016 11:04:07 AM Confirm and document reason for call. If symptomatic, describe symptoms. You must click the next button to save text entered. ---Caller states he was feeling better and yesterday sx began to worsen again, coughing all night. Caller states he doesn't know what to take anymore. Caller has sinus pressure and headache, denies fever. Has the patient traveled out of the country within the last 30 days? ---No Does the patient have any new or worsening symptoms? ---Yes Will a triage be completed? ---Yes Related visit to physician within the last 2 weeks? ---Yes Does the PT have any chronic conditions? (i.e. diabetes, asthma, etc.) ---Yes List chronic conditions. ---htn Is this a behavioral health or substance abuse call? ---No Guidelines Guideline Title Affirmed Question Affirmed Notes Nurse Date/Time (Eastern Time) Cough - Acute Productive [1] Continuous (nonstop) coughing interferes with work or school AND [2] no improvement using cough treatment  per Care Advice Richardson Landry, RN, Aldona Bar 07/17/2016 11:05:57 AM

## 2016-07-20 MED ORDER — AMOXICILLIN-POT CLAVULANATE 875-125 MG PO TABS: 1.0000 | ORAL_TABLET | Freq: Two times a day (BID) | ORAL | 0 refills | Status: DC

## 2016-07-20 NOTE — Addendum Note (Signed)
Addended by: Elio Forget on: 07/20/2016 10:49 AM   Modules accepted: Orders

## 2016-07-20 NOTE — Telephone Encounter (Signed)
I left a voice message with below information for pt. 

## 2016-07-20 NOTE — Telephone Encounter (Signed)
Pt is aware via voicemail that medication was sent to pharmacy.

## 2016-07-20 NOTE — Telephone Encounter (Signed)
Yes he can take Prednisone and Doxycycline together

## 2016-07-20 NOTE — Telephone Encounter (Signed)
Call in Augmentin [phone] bid for 10 days  

## 2016-07-23 NOTE — Telephone Encounter (Signed)
Error

## 2016-08-13 ENCOUNTER — Ambulatory Visit (INDEPENDENT_AMBULATORY_CARE_PROVIDER_SITE_OTHER): Payer: BC Managed Care – PPO | Admitting: Podiatry

## 2016-08-13 ENCOUNTER — Ambulatory Visit (INDEPENDENT_AMBULATORY_CARE_PROVIDER_SITE_OTHER): Payer: BC Managed Care – PPO

## 2016-08-13 DIAGNOSIS — D3610 Benign neoplasm of peripheral nerves and autonomic nervous system, unspecified: Secondary | ICD-10-CM

## 2016-08-13 DIAGNOSIS — Z9889 Other specified postprocedural states: Secondary | ICD-10-CM

## 2016-08-13 DIAGNOSIS — M779 Enthesopathy, unspecified: Secondary | ICD-10-CM

## 2016-08-13 DIAGNOSIS — M7751 Other enthesopathy of right foot: Secondary | ICD-10-CM

## 2016-08-13 MED ORDER — NONFORMULARY OR COMPOUNDED ITEM: 3 refills | Status: DC

## 2016-08-13 MED ORDER — GABAPENTIN 400 MG PO CAPS: 400.0000 mg | ORAL_CAPSULE | Freq: Three times a day (TID) | ORAL | 2 refills | Status: DC

## 2016-08-13 NOTE — Progress Notes (Signed)
Subjective:     Patient ID: [name], male   DOB: 11-14-1960, 55 y.o.   MRN: [id number]  HPI patient states that he feels strange burning in his right foot with a shooting-like pain but he's not sure what it may be. States that the long feeling that he had prior to surgery is gone   Review of Systems     Objective:   Physical Exam Neurovascular status intact with sensitivity of the plantar aspect of the third interspace and fourth metatarsal localized in nature with no palpable [name] sign present    Assessment:     Very difficult to decide could this be related to the joint could be related to some kind of a aberrant nerve or may be related to his chronic back and neck problems    Plan:     Explained all this to him and at this point he is can start gabapentin again in a try topical neuropathy cream and I did do a injection of a purified alcohol Marcaine proximal to the site and will see back again in 2 weeks. Understands he may need surgical exploration of this problem

## 2016-08-24 ENCOUNTER — Telehealth: Payer: Self-pay | Admitting: *Deleted

## 2016-08-24 NOTE — Telephone Encounter (Addendum)
Pt states he was put on Gabapentin oral and Gabapentin cream and he has found out his is highly allergic. Pt asked what he could do now. 08/25/2016-Left message informing pt Dr. Mellody Drown orders and he would discuss 09/02/2016 at appt.

## 2016-08-25 NOTE — Telephone Encounter (Signed)
Nothing for right now

## 2016-09-02 ENCOUNTER — Ambulatory Visit: Payer: BC Managed Care – PPO | Admitting: Podiatry

## 2016-10-04 ENCOUNTER — Telehealth: Payer: Self-pay

## 2016-10-04 MED ORDER — HYDROCORTISONE 2.5 % RE CREA: TOPICAL_CREAM | RECTAL | 3 refills | Status: DC

## 2016-10-04 NOTE — Telephone Encounter (Signed)
Please change from the foam to Proctocream 2.5% (cream) to apply prn, same sig otherwise

## 2016-10-04 NOTE — Telephone Encounter (Signed)
Received PA denial on Proctofoam- HC 1%-1% foam. Formulary alternatives that are covered by this plan include:  Hydrocortisone cream 2.5% rectal, procto-med hc cream 2.5% rectal, procto-pak cream 1% rectal, proctosol hc cream 2.5% rectal, or proctozone-hc cream 2.5% rectal.

## 2016-10-04 NOTE — Telephone Encounter (Signed)
Rx sent 

## 2016-10-12 ENCOUNTER — Ambulatory Visit: Payer: BC Managed Care – PPO

## 2016-10-15 ENCOUNTER — Ambulatory Visit: Payer: BC Managed Care – PPO

## 2016-10-15 ENCOUNTER — Other Ambulatory Visit: Payer: BC Managed Care – PPO

## 2016-10-20 DIAGNOSIS — Z79891 Long term (current) use of opiate analgesic: Secondary | ICD-10-CM | POA: Diagnosis not present

## 2016-10-20 DIAGNOSIS — M961 Postlaminectomy syndrome, not elsewhere classified: Secondary | ICD-10-CM | POA: Diagnosis not present

## 2016-10-20 DIAGNOSIS — G894 Chronic pain syndrome: Secondary | ICD-10-CM | POA: Diagnosis not present

## 2016-10-20 DIAGNOSIS — M47812 Spondylosis without myelopathy or radiculopathy, cervical region: Secondary | ICD-10-CM | POA: Diagnosis not present

## 2016-10-26 ENCOUNTER — Ambulatory Visit (INDEPENDENT_AMBULATORY_CARE_PROVIDER_SITE_OTHER): Payer: BC Managed Care – PPO | Admitting: Family Medicine

## 2016-10-26 ENCOUNTER — Other Ambulatory Visit (INDEPENDENT_AMBULATORY_CARE_PROVIDER_SITE_OTHER): Payer: BC Managed Care – PPO

## 2016-10-26 DIAGNOSIS — Z23 Encounter for immunization: Secondary | ICD-10-CM

## 2016-10-26 DIAGNOSIS — Z0000 Encounter for general adult medical examination without abnormal findings: Secondary | ICD-10-CM

## 2016-10-26 LAB — LIPID PANEL
CHOL/HDL RATIO: 5
Cholesterol: 235 mg/dL — ABNORMAL HIGH (ref 0–200)
HDL: 47.9 mg/dL (ref >39.00)
LDL CALC: 169 mg/dL — AB (ref 0–99)
NONHDL: 186.79
Triglycerides: 90 mg/dL (ref 0.0–149.0)
VLDL: 18 mg/dL (ref 0.0–40.0)

## 2016-10-26 LAB — HEPATIC FUNCTION PANEL
ALBUMIN: 4.5 g/dL (ref 3.5–5.2)
ALK PHOS: 60 U/L (ref 39–117)
ALT: 29 U/L (ref 0–53)
AST: 23 U/L (ref 0–37)
Bilirubin, Direct: 0.1 mg/dL (ref 0.0–0.3)
TOTAL PROTEIN: 7.1 g/dL (ref 6.0–8.3)
Total Bilirubin: 0.5 mg/dL (ref 0.2–1.2)

## 2016-10-26 LAB — POC URINALSYSI DIPSTICK (AUTOMATED)
BILIRUBIN UA: NEGATIVE
Blood, UA: NEGATIVE
GLUCOSE UA: NEGATIVE
KETONES UA: NEGATIVE
LEUKOCYTES UA: NEGATIVE
Nitrite, UA: NEGATIVE
Protein, UA: NEGATIVE
SPEC GRAV UA: 1.02
Urobilinogen, UA: 0.2
pH, UA: 7

## 2016-10-26 LAB — BASIC METABOLIC PANEL
BUN: 16 mg/dL (ref 6–23)
CO2: 28 meq/L (ref 19–32)
CREATININE: 1.02 mg/dL (ref 0.40–1.50)
Calcium: 9.2 mg/dL (ref 8.4–10.5)
Chloride: 104 mEq/L (ref 96–112)
GFR: 80.76 mL/min (ref >60.00)
Glucose, Bld: 109 mg/dL — ABNORMAL HIGH (ref 70–99)
Potassium: 4.3 mEq/L (ref 3.5–5.1)
SODIUM: 139 meq/L (ref 135–145)

## 2016-10-26 LAB — CBC WITH DIFFERENTIAL/PLATELET
BASOS PCT: 0.4 % (ref 0.0–3.0)
Basophils Absolute: 0 10*3/uL (ref 0.0–0.1)
EOS ABS: 0.1 10*3/uL (ref 0.0–0.7)
Eosinophils Relative: 1.5 % (ref 0.0–5.0)
HEMATOCRIT: 47.9 % (ref 39.0–52.0)
HEMOGLOBIN: 16.5 g/dL (ref 13.0–17.0)
LYMPHS PCT: 16.9 % (ref 12.0–46.0)
Lymphs Abs: 1.3 10*3/uL (ref 0.7–4.0)
MCHC: 34.3 g/dL (ref 30.0–36.0)
MCV: 89.3 fl (ref 78.0–100.0)
MONOS PCT: 10.2 % (ref 3.0–12.0)
Monocytes Absolute: 0.8 10*3/uL (ref 0.1–1.0)
NEUTROS ABS: 5.6 10*3/uL (ref 1.4–7.7)
Neutrophils Relative %: 71 % (ref 43.0–77.0)
Platelets: 229 10*3/uL (ref 150.0–400.0)
RBC: 5.37 Mil/uL (ref 4.22–5.81)
RDW: 13.5 % (ref 11.5–15.5)
WBC: 8 10*3/uL (ref 4.0–10.5)

## 2016-10-26 LAB — TSH: TSH: 1.97 u[IU]/mL (ref 0.35–4.50)

## 2016-10-26 LAB — PSA: PSA: 2.47 ng/mL (ref 0.10–4.00)

## 2016-10-27 ENCOUNTER — Other Ambulatory Visit: Payer: BC Managed Care – PPO

## 2016-11-01 ENCOUNTER — Encounter: Payer: BC Managed Care – PPO | Admitting: Family Medicine

## 2016-11-02 ENCOUNTER — Ambulatory Visit (INDEPENDENT_AMBULATORY_CARE_PROVIDER_SITE_OTHER): Payer: BC Managed Care – PPO | Admitting: Orthopedic Surgery

## 2016-11-02 ENCOUNTER — Encounter (INDEPENDENT_AMBULATORY_CARE_PROVIDER_SITE_OTHER): Payer: Self-pay | Admitting: Orthopedic Surgery

## 2016-11-02 DIAGNOSIS — G5761 Lesion of plantar nerve, right lower limb: Secondary | ICD-10-CM | POA: Diagnosis not present

## 2016-11-02 NOTE — Progress Notes (Signed)
Office Visit Note   Patient: [name]           Date of Birth: 22-Dec-1960           MRN: [id number] Visit Date: 11/02/2016              Requested by: [name], MD [place], [place] [id number] PCP: Alysia Penna, MD  Chief Complaint  Patient presents with  . Right Foot - Pain    HPI: Patient is 56 y.o male here for second opinion of right foot. He had originally seen by podiatrist, he had excision of neuroma May 18, 2016. He states he has had problems ever since. He states it is effecting his gait and overall life. The pain is constant, he has gotten custom orthotics he is unable to wear. His pain is worse when he first wakes up in the morning and puts his foot on the floor. He complains of swelling. He has had to increase shoe size. He has tried gabapentin, which he ended up being allergic to. He complains of persistent numbness. Feels like he is walking on a marble. He feels electrical current upon palpation of plantar aspect of foot. 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 Patient was previously disabled for 16 years. [name], RT    Assessment & Plan: Visit Diagnoses:  1. Morton neuroma, right   Status post Morton's neuroma excision 05/18/2016  Plan: Patient is symptomatic with either a neuroma from the Morton's neuroma resection or symptomatic from the original neuroma. The third web space was injected he was given instructions for heel cord stretching this was demonstrated to him follow-up in 4 weeks. Discussed that if we cannot get this better with injections we would need to consider resection. Patient was also given instructions for scar massage twice a day.  Follow-Up Instructions: Return in about 4 weeks (around 11/30/2016).   Ortho Exam On examination patient is alert oriented no adenopathy well-dressed normal affect normal respiratory effort is good ankle and subtalar  motion he has dorsiflexion to neutral with his knee extended he has good pulses. Metatarsal heads are nontender to palpation he is directly tender to palpation of the third webspace he does have some scar tissue. Lateral compression does not reproduce pain.  Imaging: No results found.  Orders:  No orders of the defined types were placed in this encounter.  No orders of the defined types were placed in this encounter.    Procedures: No procedures performed  Clinical Data: No additional findings.  Subjective: Review of Systems  Objective: Vital Signs: There were no vitals taken for this visit.  Specialty Comments:  No specialty comments available.  PMFS History: Patient Active Problem List   Diagnosis Date Noted  . Morton neuroma, right 11/02/2016  . Sinusitis, acute 07/17/2016  . BPH with obstruction/lower urinary tract symptoms 05/28/2015  . Hypogonadism in male 05/28/2015  . Kidney stones 05/28/2015  . Elevated blood sugar 12/02/2014  . Routine general medical examination at a health care facility 07/09/2014  . Hyperlipidemia, mixed 07/09/2014  . Atypical chest pain 06/24/2014  . Cough secondary to angiotensin converting enzyme inhibitor (ACE-I) 10/31/2013  . Essential hypertension, benign 10/25/2013  . History of hepatitis C 01/01/2013  . PITUITARY INSUFFICIENCY 06/13/2009  . INSOMNIA 02/28/2009  . CHRONIC PAIN SYNDROME 08/28/2007  . Allergic rhinitis 08/28/2007  . ANGIONEUROTIC EDEMA NOT  ELSEWHERE CLASSIFIED 08/28/2007  . DERMATITIS, CONTACT, NOS 05/03/2007   Past Medical History:  Diagnosis Date  . ADHD (attention deficit hyperactivity disorder), inattentive type    sees Dr. Celesta Aver in [place], [place]   . Anxiety    sees Dr. Celesta Aver in [place], [place]   . Arthritis   . Benign enlargement of prostate    sees [facility]   . Carpal tunnel syndrome on left    sees Dr. Eugene Garnet at [facility]   . Chronic pain    sees Dr. Nicholaus Bloom (meds) and  [facility] (accupuncture)   . Depression   . Functional impotence   . Hypogonadism in male    sees Larene Beach (a [place]) at [facility]   . Insomnia   . Kidney stones   . Sinusitis, acute 07/17/2016  . Urticaria due to cold     Family History  Problem Relation Age of Onset  . Heart disease Other   . Diabetes Other   . Hyperlipidemia Other   . Hypertension Sister   . Prostate cancer Neg Hx   . Kidney disease Neg Hx     Past Surgical History:  Procedure Laterality Date  . APPENDECTOMY    . CERVICAL FUSION    . CERVICAL LAMINECTOMY    . LUMBAR DISC SURGERY     x 3   Social History   Occupational History  . Not on file.   Social History Main Topics  . Smoking status: Former Research scientist (life sciences)  . Smokeless tobacco: Never Used     Comment: quit 35 years  . Alcohol use 0.0 oz/week     Comment: occasional  . Drug use: No  . Sexual activity: Yes    Birth control/ protection: Condom

## 2016-11-08 ENCOUNTER — Ambulatory Visit (INDEPENDENT_AMBULATORY_CARE_PROVIDER_SITE_OTHER): Payer: BC Managed Care – PPO | Admitting: Family Medicine

## 2016-11-08 ENCOUNTER — Encounter: Payer: Self-pay | Admitting: Family Medicine

## 2016-11-08 VITALS — BP 125/94 | HR 94 | Temp 98.3°F | Ht 68.5 in | Wt 171.0 lb

## 2016-11-08 DIAGNOSIS — Z0000 Encounter for general adult medical examination without abnormal findings: Secondary | ICD-10-CM

## 2016-11-08 DIAGNOSIS — B360 Pityriasis versicolor: Secondary | ICD-10-CM | POA: Insufficient documentation

## 2016-11-08 MED ORDER — VALACYCLOVIR HCL 500 MG PO TABS
500.0000 mg | ORAL_TABLET | Freq: Two times a day (BID) | ORAL | 3 refills | Status: DC
Start: 2016-11-08 — End: 2017-12-08

## 2016-11-08 MED ORDER — LOSARTAN POTASSIUM-HCTZ 50-12.5 MG PO TABS: 1.0000 | ORAL_TABLET | Freq: Every day | ORAL | 3 refills | Status: DC

## 2016-11-08 MED ORDER — EPINEPHRINE 0.3 MG/0.3ML IJ SOAJ: 0.3000 mg | Freq: Once | INTRAMUSCULAR | 5 refills | Status: AC

## 2016-11-08 MED ORDER — POTASSIUM CITRATE ER 10 MEQ (1080 MG) PO TBCR: 10.0000 meq | EXTENDED_RELEASE_TABLET | Freq: Every day | ORAL | 3 refills | Status: DC

## 2016-11-08 MED ORDER — KETOCONAZOLE 200 MG PO TABS: 200.0000 mg | ORAL_TABLET | Freq: Every day | ORAL | 0 refills | Status: DC

## 2016-11-08 MED ORDER — AMLODIPINE BESYLATE 10 MG PO TABS: 10.0000 mg | ORAL_TABLET | Freq: Every day | ORAL | 3 refills | Status: DC

## 2016-11-08 MED ORDER — AMPHETAMINE-DEXTROAMPHETAMINE 10 MG PO TABS: 5.0000 mg | ORAL_TABLET | Freq: Every day | ORAL | 0 refills | Status: DC

## 2016-11-08 NOTE — Progress Notes (Signed)
   Subjective:    Patient ID: [name], male    DOB: 10-08-1960, 56 y.o.   MRN: [id number]  HPI 56 yr old male for a well exam. His main concern lately has been right foot pain. He had a Morton neuroma excised by Dr. Paulla Dolly in August 2017 but this did not help. He has had constant pain in the foot ever since. He has seen Dr. Sharol Given for a second opinion. His other concern today is a one year hx of a rash on the trunk. It seems to come and go. It itches at times but not always.    Review of Systems  Constitutional: Negative.   HENT: Negative.   Eyes: Negative.   Respiratory: Negative.   Cardiovascular: Negative.   Gastrointestinal: Negative.   Genitourinary: Negative.   Musculoskeletal: Positive for arthralgias and back pain. Negative for joint swelling, myalgias, neck pain and neck stiffness.  Skin: Positive for rash.  Neurological: Negative.   Psychiatric/Behavioral: Negative.        Objective:   Physical Exam  Constitutional: He is oriented to person, place, and time. He appears well-developed and well-nourished.  Limping   HENT:  Head: Normocephalic and atraumatic.  Right Ear: External ear normal.  Left Ear: External ear normal.  Nose: Nose normal.  Mouth/Throat: Oropharynx is clear and moist. No oropharyngeal exudate.  Eyes: Conjunctivae and EOM are normal. Pupils are equal, round, and reactive to light. Right eye exhibits no discharge. Left eye exhibits no discharge. No scleral icterus.  Neck: Neck supple. No JVD present. No tracheal deviation present. No thyromegaly present.  Cardiovascular: Normal rate, regular rhythm, normal heart sounds and intact distal pulses.  Exam reveals no gallop and no friction rub.   No murmur heard. Pulmonary/Chest: Effort normal and breath sounds normal. No respiratory distress. He has no wheezes. He has no rales. He exhibits no tenderness.  Abdominal: Soft. Bowel sounds are normal. He exhibits no distension and no mass. There is no  tenderness. There is no rebound and no guarding.  Genitourinary: Rectum normal, prostate normal and penis normal. Rectal exam shows guaiac negative stool. No penile tenderness.  Musculoskeletal: Normal range of motion. He exhibits no edema or tenderness.  Lymphadenopathy:    He has no cervical adenopathy.  Neurological: He is alert and oriented to person, place, and time. He has normal reflexes. No cranial nerve deficit. He exhibits normal muscle tone. Coordination normal.  Skin: Skin is warm and dry. He is not diaphoretic. No erythema. No pallor.  The trunk has widespread macular pale pink spots   Psychiatric: He has a normal mood and affect. His behavior is normal. Judgment and thought content normal.          Assessment & Plan:  Well exam. We discussed diet and exercise. He will contact [facility] to find out wen his last colonoscopy was done. For the tinea versicolor he will apply Selsun Blue shampoo daily and will take oral Ketoconazole for 90 days.  Alysia Penna, MD

## 2016-11-08 NOTE — Progress Notes (Signed)
Pre visit review using our clinic review tool, if applicable. No additional management support is needed unless otherwise documented below in the visit note. 

## 2016-11-15 ENCOUNTER — Ambulatory Visit (INDEPENDENT_AMBULATORY_CARE_PROVIDER_SITE_OTHER): Payer: BC Managed Care – PPO | Admitting: Orthopedic Surgery

## 2016-11-30 ENCOUNTER — Ambulatory Visit (INDEPENDENT_AMBULATORY_CARE_PROVIDER_SITE_OTHER): Payer: PPO | Admitting: Orthopedic Surgery

## 2016-11-30 ENCOUNTER — Encounter (INDEPENDENT_AMBULATORY_CARE_PROVIDER_SITE_OTHER): Payer: Self-pay | Admitting: Orthopedic Surgery

## 2016-11-30 DIAGNOSIS — G5761 Lesion of plantar nerve, right lower limb: Secondary | ICD-10-CM

## 2016-11-30 NOTE — Progress Notes (Signed)
Office Visit Note   Patient: [name]           Date of Birth: 1961/04/04           MRN: [id number] Visit Date: 11/30/2016              Requested by: [name], MD [place], [place] [id number] PCP: Alysia Penna, MD  Chief Complaint  Patient presents with  . Right Foot - Follow-up    HPI: Patient is a 56 y.o male who presents for four week follow up of right foot morton's neuroma. He is status post injection of third webspace with minimal relief. He is doing at home exercises, he felt his improvement is also minimal. [name], RT    Assessment & Plan: Visit Diagnoses:  1. Morton neuroma, right     Plan: Patient only had about 3 days of relief from the initial injection he has had headaches and side effects from gabapentin is not interested in trying Lyrica. He does go to a pain clinic. We will follow up in 3 months if he is still symptomatic discussed proceeding with resection of the nerve endings status post neuroma resection.  Follow-Up Instructions: Return in about 3 months (around 03/02/2017).   Ortho Exam Examination patient is alert oriented no adenopathy well-dressed normal affect normal respiratory effort he has good pulses he has good hair growth there is no skin color or temperature changes no evidence of dystrophy. The metatarsal heads are nontender to palpation he is point tender to palpation of third webspace. Lateral compression reproduces pain. ROS: Complete review of systems negative except as mentioned in the history of present illness. Imaging: No results found.  Labs: Lab Results  Component Value Date   HGBA1C 5.8 12/02/2014    Orders:  No orders of the defined types were placed in this encounter.  No orders of the defined types were placed in this encounter.    Procedures: No procedures performed  Clinical Data: No additional findings.  Subjective: Review of Systems  Objective: Vital Signs: There  were no vitals taken for this visit.  Specialty Comments:  No specialty comments available.  PMFS History: Patient Active Problem List   Diagnosis Date Noted  . Tinea versicolor 11/08/2016  . Morton neuroma, right 11/02/2016  . Sinusitis, acute 07/17/2016  . BPH with obstruction/lower urinary tract symptoms 05/28/2015  . Hypogonadism in male 05/28/2015  . Kidney stones 05/28/2015  . Elevated blood sugar 12/02/2014  . Routine general medical examination at a health care facility 07/09/2014  . Hyperlipidemia, mixed 07/09/2014  . Atypical chest pain 06/24/2014  . Cough secondary to angiotensin converting enzyme inhibitor (ACE-I) 10/31/2013  . Essential hypertension, benign 10/25/2013  . History of hepatitis C 01/01/2013  . PITUITARY INSUFFICIENCY 06/13/2009  . INSOMNIA 02/28/2009  . CHRONIC PAIN SYNDROME 08/28/2007  . Allergic rhinitis 08/28/2007  . ANGIONEUROTIC EDEMA NOT ELSEWHERE CLASSIFIED 08/28/2007  . DERMATITIS, CONTACT, NOS 05/03/2007   Past Medical History:  Diagnosis Date  . ADHD (attention deficit hyperactivity disorder), inattentive type    sees Dr. Celesta Aver in [place], [place]   . Anxiety    sees Dr. Celesta Aver in [place], [place]   . Arthritis   . Benign enlargement of prostate    sees [facility]   . Carpal tunnel syndrome on left    sees Dr. Eugene Garnet at [facility]   . Chronic pain    sees Dr. Nicholaus Bloom (meds) and Dorathy Kinsman (  accupuncture)   . Depression   . Functional impotence   . Hypogonadism in male    sees Larene Beach (a [place]) at [facility]   . Insomnia   . Kidney stones   . Sinusitis, acute 07/17/2016  . Urticaria due to cold     Family History  Problem Relation Age of Onset  . Heart disease Other   . Diabetes Other   . Hyperlipidemia Other   . Hypertension Sister   . Prostate cancer Neg Hx   . Kidney disease Neg Hx     Past Surgical History:  Procedure Laterality Date  . APPENDECTOMY    . CERVICAL FUSION    . CERVICAL  LAMINECTOMY    . LUMBAR DISC SURGERY     x 3   Social History   Occupational History  . Not on file.   Social History Main Topics  . Smoking status: Former Research scientist (life sciences)  . Smokeless tobacco: Never Used     Comment: quit 35 years  . Alcohol use 0.0 oz/week     Comment: occasional  . Drug use: No  . Sexual activity: Yes    Birth control/ protection: Condom

## 2016-12-10 ENCOUNTER — Encounter: Payer: Self-pay | Admitting: Family Medicine

## 2016-12-10 ENCOUNTER — Ambulatory Visit (INDEPENDENT_AMBULATORY_CARE_PROVIDER_SITE_OTHER): Payer: BC Managed Care – PPO | Admitting: Family Medicine

## 2016-12-10 VITALS — BP 132/99 | HR 108 | Temp 98.3°F | Ht 68.5 in | Wt 171.0 lb

## 2016-12-10 DIAGNOSIS — G5761 Lesion of plantar nerve, right lower limb: Secondary | ICD-10-CM | POA: Diagnosis not present

## 2016-12-10 DIAGNOSIS — L03211 Cellulitis of face: Secondary | ICD-10-CM | POA: Diagnosis not present

## 2016-12-10 MED ORDER — DOXYCYCLINE HYCLATE 100 MG PO CAPS: 100.0000 mg | ORAL_CAPSULE | Freq: Two times a day (BID) | ORAL | 0 refills | Status: AC

## 2016-12-10 MED ORDER — PREGABALIN 50 MG PO CAPS: 50.0000 mg | ORAL_CAPSULE | Freq: Three times a day (TID) | ORAL | 2 refills | Status: DC

## 2016-12-10 NOTE — Patient Instructions (Signed)
WE NOW OFFER   [facility] FAST TRACK!!!  SAME DAY Appointments for ACUTE CARE  Such as: Sprains, Injuries, cuts, abrasions, rashes, muscle pain, joint pain, back pain Colds, flu, sore throats, headache, allergies, cough, fever  Ear pain, sinus and eye infections Abdominal pain, nausea, vomiting, diarrhea, upset stomach Animal/insect bites  3 Easy Ways to Schedule: Walk-In Scheduling Call in scheduling Mychart Sign-up: [email or web address]         

## 2016-12-10 NOTE — Progress Notes (Signed)
Pre visit review using our clinic review tool, if applicable. No additional management support is needed unless otherwise documented below in the visit note. 

## 2016-12-10 NOTE — Progress Notes (Signed)
   Subjective:    Patient ID: [name], male    DOB: [birth year], 56 y.o.   MRN: [id number]  HPI Here for 2 issues. First he has had chronic pain in the right foot and had a Mortons neuroma removed by Dr. Paulla Dolly. This was not successful however so he saw Dr. Sharol Given, and he said the only option would be to do another surgery. [name] is not ready for that and asks if we can try something else. Also 3 days ago a painful lump came up on the right cheek. Last night his entire right cheek was swollen, but after applying hot compresses today this has come down some.    Review of Systems  Constitutional: Negative.   Respiratory: Negative.   Cardiovascular: Negative.   Musculoskeletal: Positive for arthralgias.  Skin: Positive for wound.       Objective:   Physical Exam  Constitutional: He appears well-developed and well-nourished.  Limping   Cardiovascular: Normal rate, regular rhythm, normal heart sounds and intact distal pulses.   Pulmonary/Chest: Effort normal and breath sounds normal.  Skin:  Tender red papule on the right nasolabial fold , mild edema           Assessment & Plan:  Cellulitis on the face from a boil, treat with Doxycycline. For the foot pain try Lyrica 50 mg . Alysia Penna, MD

## 2016-12-15 DIAGNOSIS — Z79891 Long term (current) use of opiate analgesic: Secondary | ICD-10-CM | POA: Diagnosis not present

## 2016-12-15 DIAGNOSIS — G894 Chronic pain syndrome: Secondary | ICD-10-CM | POA: Diagnosis not present

## 2016-12-15 DIAGNOSIS — M961 Postlaminectomy syndrome, not elsewhere classified: Secondary | ICD-10-CM | POA: Diagnosis not present

## 2016-12-15 DIAGNOSIS — M47812 Spondylosis without myelopathy or radiculopathy, cervical region: Secondary | ICD-10-CM | POA: Diagnosis not present

## 2016-12-30 ENCOUNTER — Telehealth: Payer: Self-pay | Admitting: Family Medicine

## 2016-12-30 NOTE — Telephone Encounter (Addendum)
Pt would like a referral to [facility] for podiatry. Pt saw dr fry on 3-16 for foot pain and neuroma.

## 2017-01-03 NOTE — Telephone Encounter (Signed)
I did the referral 

## 2017-01-03 NOTE — Addendum Note (Signed)
Addended by: Alysia Penna A on: 01/03/2017 05:01 PM   Modules accepted: Orders

## 2017-01-05 ENCOUNTER — Telehealth: Payer: Self-pay | Admitting: Family Medicine

## 2017-01-05 NOTE — Telephone Encounter (Signed)
This is a duplicate note, referral was made and left a voice message for pt with this information.

## 2017-01-05 NOTE — Telephone Encounter (Signed)
Pt would like to know if Dr. Sarajane Jews found someone that he could go to for his foot surgery.  Dr. Sarajane Jews had mentioned [facility] or [facility] not sure if he looked into either of those.   [employer] may be contacting Dr. Sarajane Jews about one of his controlled substance medications quantity.  They are aware that he is in a pain management program.

## 2017-01-05 NOTE — Telephone Encounter (Signed)
I left a voice message about referral.

## 2017-01-11 ENCOUNTER — Encounter: Payer: Self-pay | Admitting: Family Medicine

## 2017-01-12 NOTE — Telephone Encounter (Signed)
Tell [name] that I really don't have a preference about who he sees. I know nothing about Dr. Raelyn Ensign but he can certainly see him if he wishes. I would be happy to change the referral if he wants me to

## 2017-01-17 DIAGNOSIS — R200 Anesthesia of skin: Secondary | ICD-10-CM | POA: Diagnosis not present

## 2017-01-17 DIAGNOSIS — G5761 Lesion of plantar nerve, right lower limb: Secondary | ICD-10-CM | POA: Diagnosis not present

## 2017-01-17 DIAGNOSIS — R202 Paresthesia of skin: Secondary | ICD-10-CM | POA: Diagnosis not present

## 2017-01-17 DIAGNOSIS — M7741 Metatarsalgia, right foot: Secondary | ICD-10-CM | POA: Diagnosis not present

## 2017-01-18 ENCOUNTER — Other Ambulatory Visit: Payer: Self-pay | Admitting: Foot & Ankle Surgery

## 2017-01-18 DIAGNOSIS — R202 Paresthesia of skin: Secondary | ICD-10-CM

## 2017-01-18 DIAGNOSIS — M7741 Metatarsalgia, right foot: Secondary | ICD-10-CM

## 2017-01-18 DIAGNOSIS — R200 Anesthesia of skin: Secondary | ICD-10-CM

## 2017-01-18 IMAGING — MR MR CERVICAL SPINE W/O CM
5 series · 28 of 48 positions shown · non-contrast
Comparison: MRI of the cervical spine 03/19/2009.

CLINICAL DATA: Neck pain extending into the left upper extremity
and hand. Numbness in left hand and fingers.

EXAM:
MRI CERVICAL SPINE WITHOUT CONTRAST
TECHNIQUE: Multiplanar, multisequence MR imaging of the cervical spine was
performed. No intravenous contrast was administered.

[Series 3: T2 · sagittal · 3.0mm · 0.66mm/px · 6 of 12 slices shown (1 of 2)]
[im 1/12]
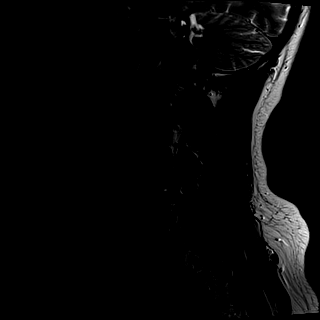
[im 3/12]
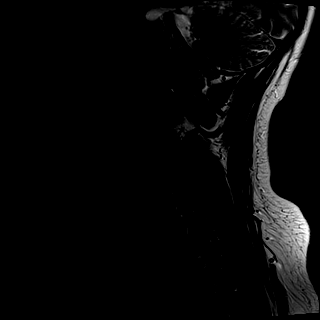
[im 5/12]
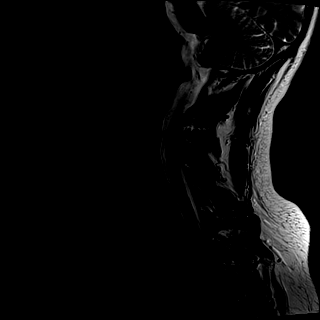
[im 7/12]
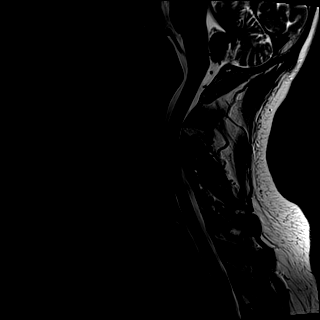
[im 9/12]
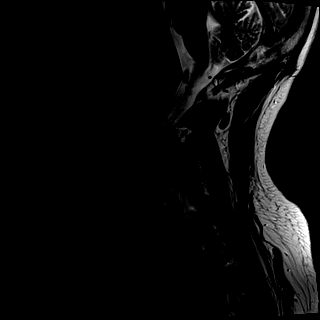
[im 12/12]
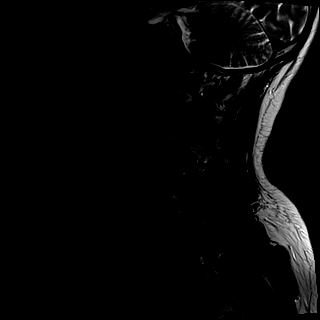

[Series 4: T1 · sagittal · 3.0mm · 0.41mm/px · 6 of 12 slices shown]
[im 1/12]
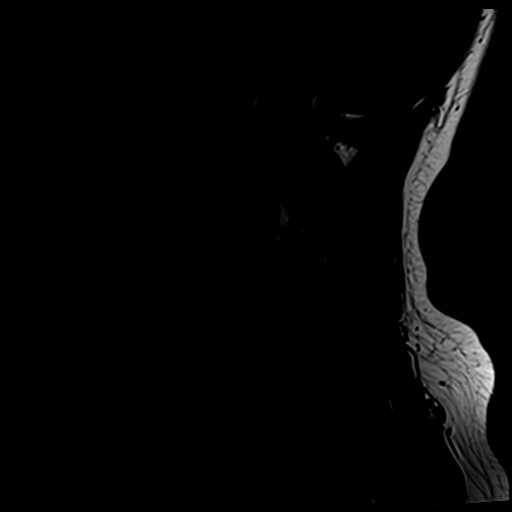
[im 3/12]
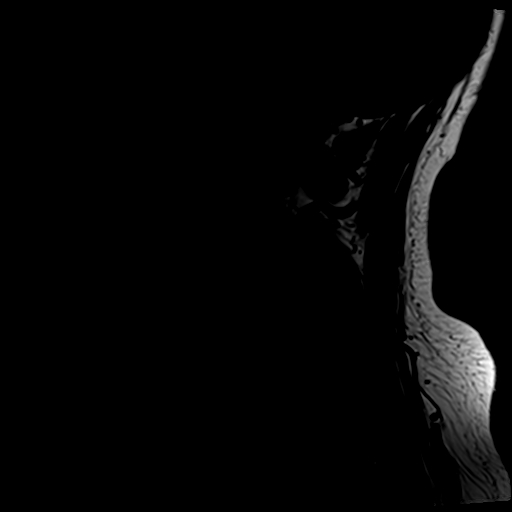
[im 5/12]
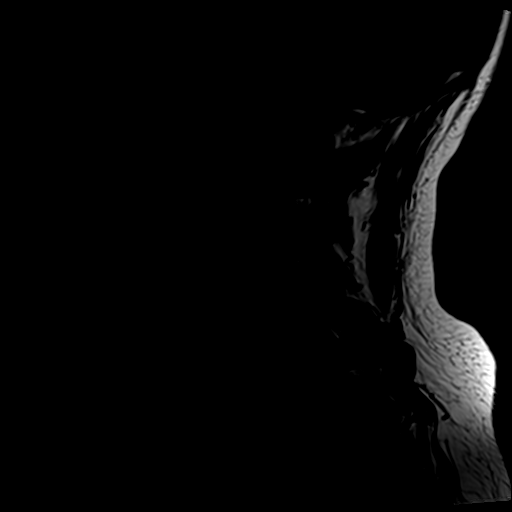
[im 7/12]
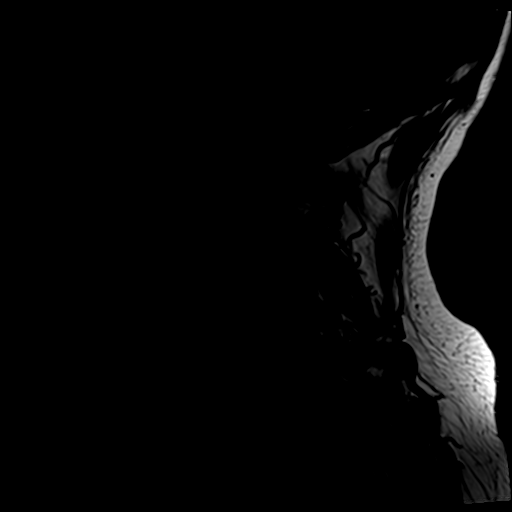
[im 9/12]
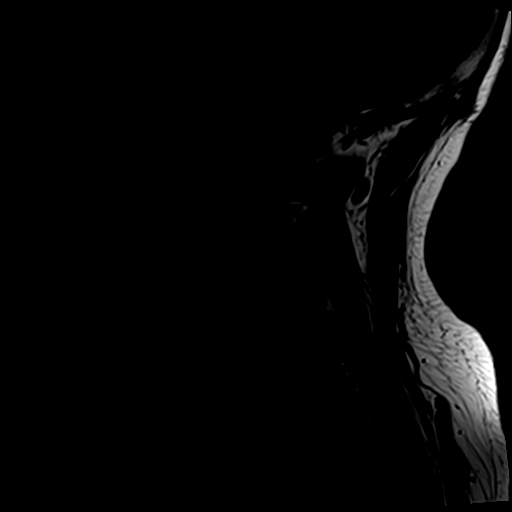
[im 12/12]
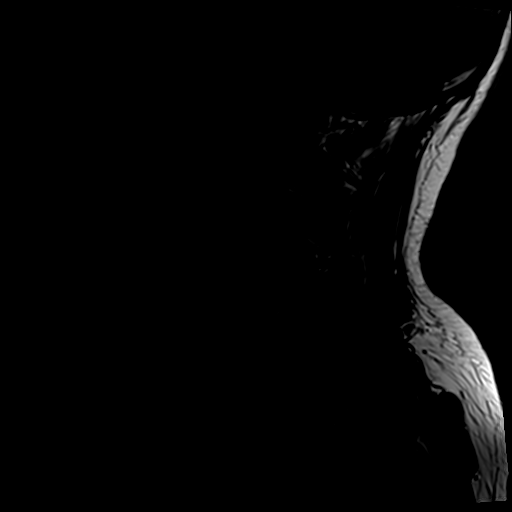

[Series 5: tir sag · sagittal · 3.0mm · 0.41mm/px · 6 of 12 slices shown]
[im 1/12]
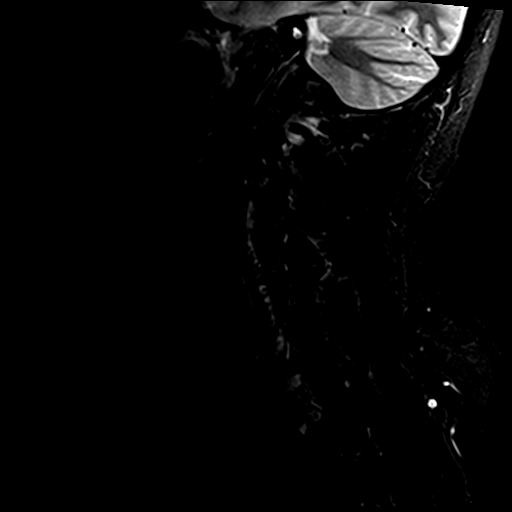
[im 3/12]
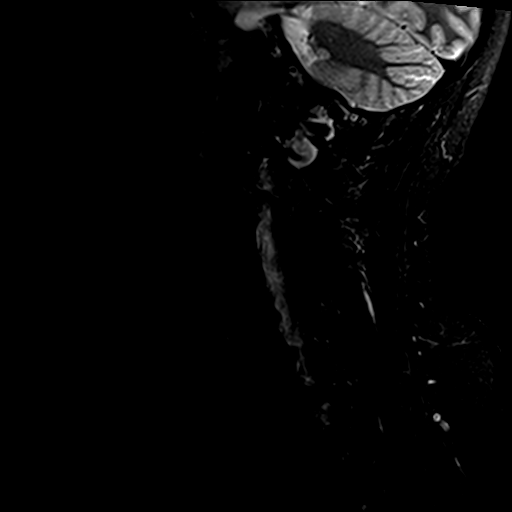
[im 5/12]
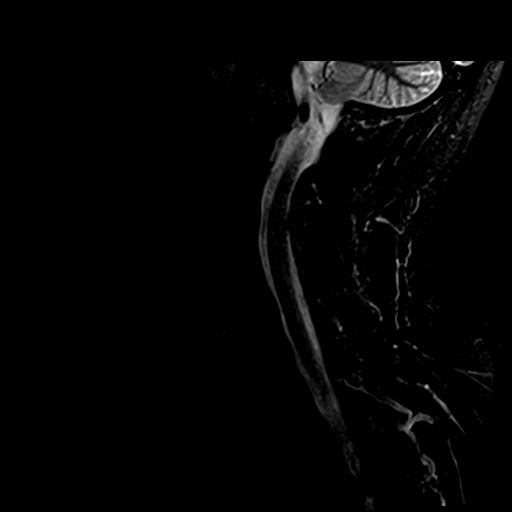
[im 7/12]
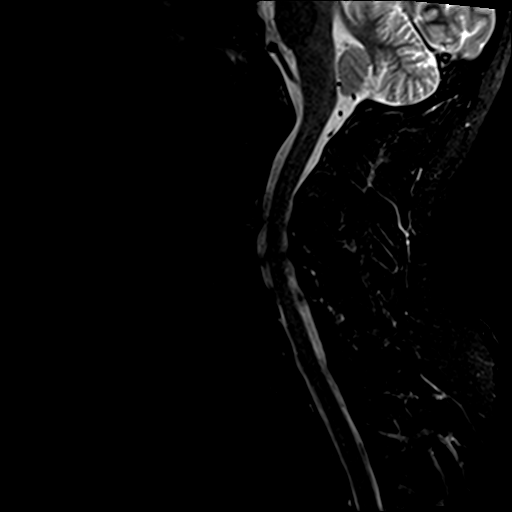
[im 9/12]
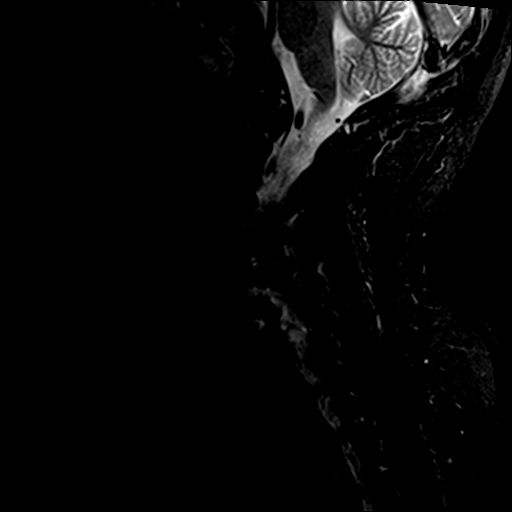
[im 12/12]
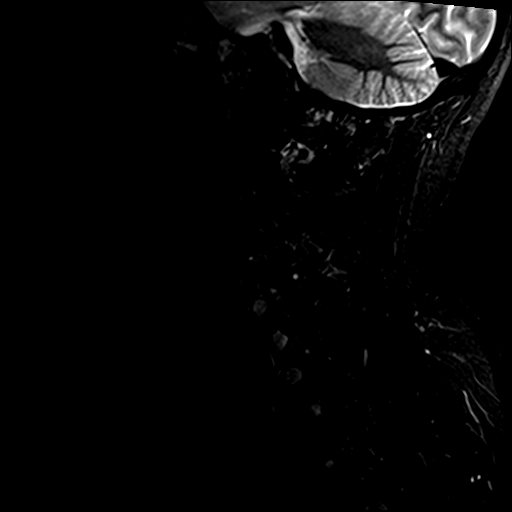

[Series 6: GRE · axial · 3.0mm · 0.35mm/px · 1 of 32 slices shown]
[im 1/32]
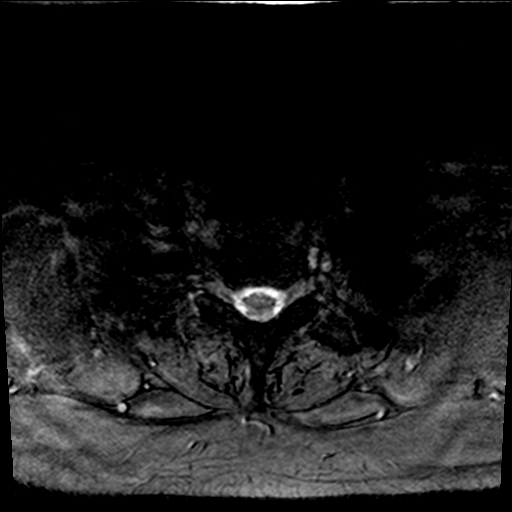

[Series 7: T2 · axial · 3.0mm · 0.70mm/px · z∈[-53,+60]mm · 9 of 32 slices shown (2 of 2)]
[im 1/32]
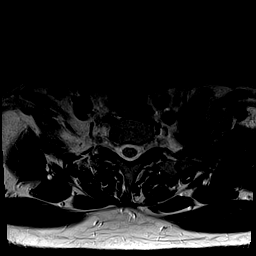
[im 5/32]
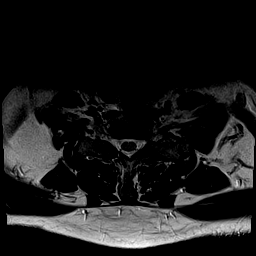
[im 9/32]
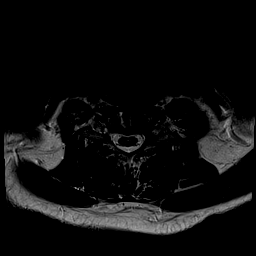
[im 14/32]
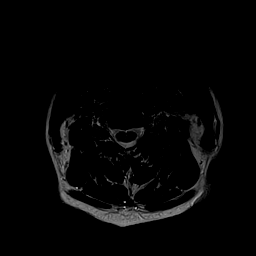
[im 16/32]
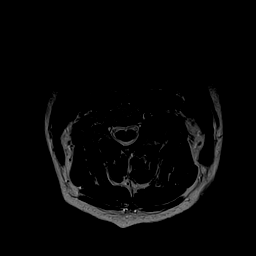
[im 18/32]
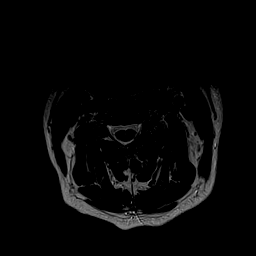
[im 23/32]
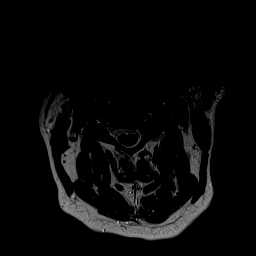
[im 27/32]
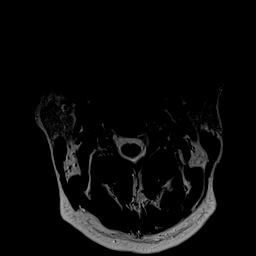
[im 32/32]
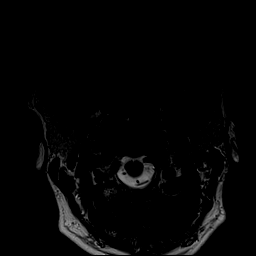

[28 of 48 positions shown; findings below may reference images not displayed]

FINDINGS: Alignment: Alignment is anatomic.

Vertebrae: Anterior fusion is again noted at C6-7. Mild endplate
marrow changes are present on the left at C3-4. Marrow signal and
vertebral body heights are otherwise normal.

Cord: Normal signal is present in the cervical and upper thoracic
spinal cord to the lowest imaged level, T2-3.

Posterior Fossa, vertebral arteries, paraspinal tissues: The
craniocervical junction is within normal limits. The visualized
intracranial contents are normal. Flow is present in the vertebral
arteries.

Disc levels:

C2-3: Asymmetric left-sided facet spur spurring has progressed. This
contributes to mild left foraminal narrowing.

C3-4: Progressive left-sided uncovertebral and facet hypertrophy
results in severe left foraminal narrowing. The central canal and
right foramen remain patent.

C4-5: Advanced left-sided facet hypertrophy is again noted. Moderate
left foraminal stenosis is slightly worse than on the prior study.
Edematous changes are present within the marrow of the left facets

C5-6: Moderate left-sided facet hypertrophy has progressed. Mild
foraminal narrowing is evident bilaterally.

C6-7: Anterior fusion is solid. Mild left foraminal narrowing is
stable.

C7-T1: Asymmetric left-sided facet hypertrophy is present. The
foramina are patent.
IMPRESSION: 1. Progressive multilevel left-sided facet hypertrophy and foraminal
narrowing.
2. New mild left foraminal narrowing due to facet spurring at C2-3.
3. Progressive left-sided uncovertebral and facet hypertrophy with
now severe left foraminal stenosis at C3-4.
4. Moderate left foraminal stenosis at C4-5 is slightly worse than
on the prior study.
5. Edema within the facet joint at C4-5 suggesting acute
inflammation.
6. Progressive moderate left-sided facet hypertrophy and mild
bilateral foraminal narrowing at C5-6.
7. Stable mild left foraminal narrowing at C6-7.
8. Progressive asymmetric left-sided facet hypertrophy at C7-T1
without significant stenosis.

## 2017-01-31 ENCOUNTER — Other Ambulatory Visit: Payer: BC Managed Care – PPO

## 2017-02-05 ENCOUNTER — Ambulatory Visit
Admission: RE | Admit: 2017-02-05 | Discharge: 2017-02-05 | Disposition: A | Discharge status: Home / Self Care | Payer: BC Managed Care – PPO | Source: Ambulatory Visit | Attending: Foot & Ankle Surgery | Admitting: Foot & Ankle Surgery

## 2017-02-05 ENCOUNTER — Encounter: Payer: Self-pay | Admitting: Family Medicine

## 2017-02-05 DIAGNOSIS — M7741 Metatarsalgia, right foot: Secondary | ICD-10-CM

## 2017-02-05 DIAGNOSIS — R200 Anesthesia of skin: Secondary | ICD-10-CM

## 2017-02-05 DIAGNOSIS — M79674 Pain in right toe(s): Secondary | ICD-10-CM | POA: Diagnosis not present

## 2017-02-05 DIAGNOSIS — R202 Paresthesia of skin: Secondary | ICD-10-CM

## 2017-02-05 MED ORDER — GADOBENATE DIMEGLUMINE 529 MG/ML IV SOLN
15.0000 mL | Freq: Once | INTRAVENOUS | Status: AC | PRN
  Administered 2017-02-05: 15 mL via INTRAVENOUS

## 2017-02-07 NOTE — Telephone Encounter (Signed)
I am not involved with treating his pain and I cannot write any letter saying what he needs or does not need. This is up to his other providers.

## 2017-02-09 DIAGNOSIS — Z79891 Long term (current) use of opiate analgesic: Secondary | ICD-10-CM | POA: Diagnosis not present

## 2017-02-09 DIAGNOSIS — G894 Chronic pain syndrome: Secondary | ICD-10-CM | POA: Diagnosis not present

## 2017-02-09 DIAGNOSIS — M961 Postlaminectomy syndrome, not elsewhere classified: Secondary | ICD-10-CM | POA: Diagnosis not present

## 2017-02-09 DIAGNOSIS — M47812 Spondylosis without myelopathy or radiculopathy, cervical region: Secondary | ICD-10-CM | POA: Diagnosis not present

## 2017-02-22 DIAGNOSIS — G5761 Lesion of plantar nerve, right lower limb: Secondary | ICD-10-CM | POA: Diagnosis not present

## 2017-02-28 ENCOUNTER — Ambulatory Visit (INDEPENDENT_AMBULATORY_CARE_PROVIDER_SITE_OTHER): Payer: BC Managed Care – PPO | Admitting: Orthopedic Surgery

## 2017-03-22 DIAGNOSIS — G5761 Lesion of plantar nerve, right lower limb: Secondary | ICD-10-CM | POA: Diagnosis not present

## 2017-03-22 DIAGNOSIS — R202 Paresthesia of skin: Secondary | ICD-10-CM | POA: Diagnosis not present

## 2017-03-22 DIAGNOSIS — M79671 Pain in right foot: Secondary | ICD-10-CM | POA: Diagnosis not present

## 2017-03-22 DIAGNOSIS — R200 Anesthesia of skin: Secondary | ICD-10-CM | POA: Diagnosis not present

## 2017-03-23 DIAGNOSIS — D3610 Benign neoplasm of peripheral nerves and autonomic nervous system, unspecified: Secondary | ICD-10-CM | POA: Diagnosis not present

## 2017-03-25 DIAGNOSIS — G5781 Other specified mononeuropathies of right lower limb: Secondary | ICD-10-CM | POA: Diagnosis not present

## 2017-03-25 DIAGNOSIS — G5761 Lesion of plantar nerve, right lower limb: Secondary | ICD-10-CM | POA: Diagnosis not present

## 2017-03-25 DIAGNOSIS — D219 Benign neoplasm of connective and other soft tissue, unspecified: Secondary | ICD-10-CM | POA: Diagnosis not present

## 2017-04-07 DIAGNOSIS — Z79891 Long term (current) use of opiate analgesic: Secondary | ICD-10-CM | POA: Diagnosis not present

## 2017-04-07 DIAGNOSIS — M47812 Spondylosis without myelopathy or radiculopathy, cervical region: Secondary | ICD-10-CM | POA: Diagnosis not present

## 2017-04-07 DIAGNOSIS — G894 Chronic pain syndrome: Secondary | ICD-10-CM | POA: Diagnosis not present

## 2017-04-07 DIAGNOSIS — M961 Postlaminectomy syndrome, not elsewhere classified: Secondary | ICD-10-CM | POA: Diagnosis not present

## 2017-04-22 DIAGNOSIS — D3610 Benign neoplasm of peripheral nerves and autonomic nervous system, unspecified: Secondary | ICD-10-CM | POA: Diagnosis not present

## 2017-05-23 DIAGNOSIS — D3610 Benign neoplasm of peripheral nerves and autonomic nervous system, unspecified: Secondary | ICD-10-CM | POA: Diagnosis not present

## 2017-06-02 DIAGNOSIS — Z79891 Long term (current) use of opiate analgesic: Secondary | ICD-10-CM | POA: Diagnosis not present

## 2017-06-02 DIAGNOSIS — G894 Chronic pain syndrome: Secondary | ICD-10-CM | POA: Diagnosis not present

## 2017-06-02 DIAGNOSIS — M961 Postlaminectomy syndrome, not elsewhere classified: Secondary | ICD-10-CM | POA: Diagnosis not present

## 2017-06-02 DIAGNOSIS — M47812 Spondylosis without myelopathy or radiculopathy, cervical region: Secondary | ICD-10-CM | POA: Diagnosis not present

## 2017-06-16 ENCOUNTER — Encounter: Payer: Self-pay | Admitting: Family Medicine

## 2017-06-17 DIAGNOSIS — M542 Cervicalgia: Secondary | ICD-10-CM | POA: Diagnosis not present

## 2017-06-17 DIAGNOSIS — M47812 Spondylosis without myelopathy or radiculopathy, cervical region: Secondary | ICD-10-CM | POA: Diagnosis not present

## 2017-06-19 ENCOUNTER — Other Ambulatory Visit: Payer: Self-pay | Admitting: Family Medicine

## 2017-06-21 NOTE — Telephone Encounter (Signed)
Can we refill this? 

## 2017-06-30 DIAGNOSIS — M7741 Metatarsalgia, right foot: Secondary | ICD-10-CM | POA: Diagnosis not present

## 2017-06-30 DIAGNOSIS — M79671 Pain in right foot: Secondary | ICD-10-CM | POA: Diagnosis not present

## 2017-07-28 DIAGNOSIS — M47812 Spondylosis without myelopathy or radiculopathy, cervical region: Secondary | ICD-10-CM | POA: Diagnosis not present

## 2017-07-28 DIAGNOSIS — Z79891 Long term (current) use of opiate analgesic: Secondary | ICD-10-CM | POA: Diagnosis not present

## 2017-07-28 DIAGNOSIS — M961 Postlaminectomy syndrome, not elsewhere classified: Secondary | ICD-10-CM | POA: Diagnosis not present

## 2017-07-28 DIAGNOSIS — G894 Chronic pain syndrome: Secondary | ICD-10-CM | POA: Diagnosis not present

## 2017-09-12 DIAGNOSIS — R202 Paresthesia of skin: Secondary | ICD-10-CM | POA: Diagnosis not present

## 2017-09-12 DIAGNOSIS — R200 Anesthesia of skin: Secondary | ICD-10-CM | POA: Diagnosis not present

## 2017-09-12 DIAGNOSIS — M7741 Metatarsalgia, right foot: Secondary | ICD-10-CM | POA: Diagnosis not present

## 2017-09-22 DIAGNOSIS — Z79891 Long term (current) use of opiate analgesic: Secondary | ICD-10-CM | POA: Diagnosis not present

## 2017-09-22 DIAGNOSIS — M47812 Spondylosis without myelopathy or radiculopathy, cervical region: Secondary | ICD-10-CM | POA: Diagnosis not present

## 2017-09-22 DIAGNOSIS — M961 Postlaminectomy syndrome, not elsewhere classified: Secondary | ICD-10-CM | POA: Diagnosis not present

## 2017-09-22 DIAGNOSIS — G894 Chronic pain syndrome: Secondary | ICD-10-CM | POA: Diagnosis not present

## 2017-09-26 ENCOUNTER — Other Ambulatory Visit: Payer: Self-pay | Admitting: Family Medicine

## 2017-09-26 DIAGNOSIS — B009 Herpesviral infection, unspecified: Secondary | ICD-10-CM

## 2017-09-26 NOTE — Telephone Encounter (Signed)
Last OV was 12/10/2016. Medication is not listed on pt's medication list call pt.

## 2017-09-28 NOTE — Telephone Encounter (Signed)
I see that Valtrex was last refilled 10/2016.Called pt and left a VM to call back in regards to refill request

## 2017-09-29 NOTE — Telephone Encounter (Signed)
Called pt and left a VM to call back.  

## 2017-09-30 NOTE — Telephone Encounter (Signed)
Sent pt a my chart message, if he needs refill please send [place] a message.

## 2017-10-17 ENCOUNTER — Other Ambulatory Visit: Payer: Self-pay | Admitting: Family Medicine

## 2017-11-11 ENCOUNTER — Encounter: Payer: BC Managed Care – PPO | Admitting: Family Medicine

## 2017-11-14 DIAGNOSIS — M7751 Other enthesopathy of right foot: Secondary | ICD-10-CM | POA: Diagnosis not present

## 2017-11-14 DIAGNOSIS — M79671 Pain in right foot: Secondary | ICD-10-CM | POA: Diagnosis not present

## 2017-11-14 DIAGNOSIS — M7741 Metatarsalgia, right foot: Secondary | ICD-10-CM | POA: Diagnosis not present

## 2017-11-17 ENCOUNTER — Ambulatory Visit (INDEPENDENT_AMBULATORY_CARE_PROVIDER_SITE_OTHER): Payer: PPO | Admitting: Family Medicine

## 2017-11-17 ENCOUNTER — Encounter: Payer: Self-pay | Admitting: Family Medicine

## 2017-11-17 VITALS — BP 122/80 | HR 85 | Temp 97.8°F | Ht 68.5 in | Wt 172.8 lb

## 2017-11-17 DIAGNOSIS — Z0000 Encounter for general adult medical examination without abnormal findings: Secondary | ICD-10-CM

## 2017-11-17 DIAGNOSIS — Z125 Encounter for screening for malignant neoplasm of prostate: Secondary | ICD-10-CM | POA: Diagnosis not present

## 2017-11-17 LAB — TSH: TSH: 2.76 u[IU]/mL (ref 0.35–4.50)

## 2017-11-17 LAB — POC URINALSYSI DIPSTICK (AUTOMATED)
Bilirubin, UA: NEGATIVE
Blood, UA: NEGATIVE
Glucose, UA: NEGATIVE
KETONES UA: NEGATIVE
NITRITE UA: NEGATIVE
PH UA: 6 (ref 5.0–8.0)
Spec Grav, UA: >=1.03 — AB (ref 1.010–1.025)
UROBILINOGEN UA: 0.2 U/dL

## 2017-11-17 LAB — CBC WITH DIFFERENTIAL/PLATELET
BASOS ABS: 0 10*3/uL (ref 0.0–0.1)
Basophils Relative: 0.6 % (ref 0.0–3.0)
Eosinophils Absolute: 0.1 10*3/uL (ref 0.0–0.7)
Eosinophils Relative: 1.3 % (ref 0.0–5.0)
HCT: 48.8 % (ref 39.0–52.0)
Hemoglobin: 16.6 g/dL (ref 13.0–17.0)
LYMPHS ABS: 2.1 10*3/uL (ref 0.7–4.0)
Lymphocytes Relative: 27.8 % (ref 12.0–46.0)
MCHC: 34 g/dL (ref 30.0–36.0)
MCV: 90.5 fl (ref 78.0–100.0)
Monocytes Absolute: 0.7 10*3/uL (ref 0.1–1.0)
Monocytes Relative: 9.2 % (ref 3.0–12.0)
NEUTROS PCT: 61.1 % (ref 43.0–77.0)
Neutro Abs: 4.7 10*3/uL (ref 1.4–7.7)
Platelets: 230 10*3/uL (ref 150.0–400.0)
RBC: 5.39 Mil/uL (ref 4.22–5.81)
RDW: 13.5 % (ref 11.5–15.5)
WBC: 7.6 10*3/uL (ref 4.0–10.5)

## 2017-11-17 LAB — HEPATIC FUNCTION PANEL
ALT: 40 U/L (ref 0–53)
AST: 24 U/L (ref 0–37)
Albumin: 4.3 g/dL (ref 3.5–5.2)
Alkaline Phosphatase: 60 U/L (ref 39–117)
BILIRUBIN TOTAL: 0.6 mg/dL (ref 0.2–1.2)
Bilirubin, Direct: 0.2 mg/dL (ref 0.0–0.3)
Total Protein: 7 g/dL (ref 6.0–8.3)

## 2017-11-17 LAB — LIPID PANEL
CHOL/HDL RATIO: 4
Cholesterol: 226 mg/dL — ABNORMAL HIGH (ref 0–200)
HDL: 52 mg/dL (ref >39.00)
LDL CALC: 154 mg/dL — AB (ref 0–99)
NONHDL: 174.14
Triglycerides: 101 mg/dL (ref 0.0–149.0)
VLDL: 20.2 mg/dL (ref 0.0–40.0)

## 2017-11-17 LAB — BASIC METABOLIC PANEL
BUN: 15 mg/dL (ref 6–23)
CALCIUM: 9.8 mg/dL (ref 8.4–10.5)
CO2: 33 mEq/L — ABNORMAL HIGH (ref 19–32)
CREATININE: 1.15 mg/dL (ref 0.40–1.50)
Chloride: 99 mEq/L (ref 96–112)
GFR: 70.04 mL/min (ref >60.00)
Glucose, Bld: 100 mg/dL — ABNORMAL HIGH (ref 70–99)
Potassium: 4.5 mEq/L (ref 3.5–5.1)
SODIUM: 138 meq/L (ref 135–145)

## 2017-11-17 LAB — PSA: PSA: 2.21 ng/mL (ref 0.10–4.00)

## 2017-11-17 MED ORDER — EPINEPHRINE 0.3 MG/0.3ML IJ SOAJ: 0.3000 mg | Freq: Once | INTRAMUSCULAR | 5 refills | Status: AC

## 2017-11-17 MED ORDER — LOSARTAN POTASSIUM-HCTZ 50-12.5 MG PO TABS: 1.0000 | ORAL_TABLET | Freq: Every day | ORAL | 3 refills | Status: DC

## 2017-11-17 NOTE — Progress Notes (Signed)
   Subjective:    Patient ID: [name], male    DOB: November 15, 1960, 57 y.o.   MRN: [id number]  HPI Here for a well exam. He feels well except for metatarsalgia in both feet. He sees Dr. Delora Fuel for podiatric care. 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    Review of Systems  Constitutional: Negative.   HENT: Negative.   Eyes: Negative.   Respiratory: Negative.   Cardiovascular: Negative.   Gastrointestinal: Negative.   Genitourinary: Negative.   Musculoskeletal: Negative.   Skin: Negative.   Neurological: Negative.   Psychiatric/Behavioral: Negative.        Objective:   Physical Exam  Constitutional: He is oriented to person, place, and time. He appears well-developed and well-nourished. No distress.  HENT:  Head: Normocephalic and atraumatic.  Right Ear: External ear normal.  Left Ear: External ear normal.  Nose: Nose normal.  Mouth/Throat: Oropharynx is clear and moist. No oropharyngeal exudate.  Eyes: Conjunctivae and EOM are normal. Pupils are equal, round, and reactive to light. Right eye exhibits no discharge. Left eye exhibits no discharge. No scleral icterus.  Neck: Neck supple. No JVD present. No tracheal deviation present. No thyromegaly present.  Cardiovascular: Normal rate, regular rhythm, normal heart sounds and intact distal pulses. Exam reveals no gallop and no friction rub.  No murmur heard. Pulmonary/Chest: Effort normal and breath sounds normal. No respiratory distress. He has no wheezes. He has no rales. He exhibits no tenderness.  Abdominal: Soft. Bowel sounds are normal. He exhibits no distension and no mass. There is no tenderness. There is no rebound and no guarding.  Genitourinary: Rectum normal, prostate normal and penis normal. Rectal exam shows guaiac negative stool. No penile tenderness.  Musculoskeletal: Normal range of motion. He exhibits no edema or tenderness.  Lymphadenopathy:      He has no cervical adenopathy.  Neurological: He is alert and oriented to person, place, and time. He has normal reflexes. No cranial nerve deficit. He exhibits normal muscle tone. Coordination normal.  Skin: Skin is warm and dry. No rash noted. He is not diaphoretic. No erythema. No pallor.  Psychiatric: He has a normal mood and affect. His behavior is normal. Judgment and thought content normal.          Assessment & Plan:  Well exam. We discussed diet and exercise. Get fasting labs.  Alysia Penna, MD

## 2017-11-18 LAB — C. TRACHOMATIS/N. GONORRHOEAE RNA
C. trachomatis RNA, TMA: NOT DETECTED
N. gonorrhoeae RNA, TMA: NOT DETECTED

## 2017-11-23 ENCOUNTER — Other Ambulatory Visit: Payer: Self-pay | Admitting: *Deleted

## 2017-11-23 MED ORDER — DOXYCYCLINE HYCLATE 100 MG PO TABS: 100.0000 mg | ORAL_TABLET | Freq: Two times a day (BID) | ORAL | 0 refills | Status: DC

## 2017-11-24 DIAGNOSIS — M47812 Spondylosis without myelopathy or radiculopathy, cervical region: Secondary | ICD-10-CM | POA: Diagnosis not present

## 2017-11-24 DIAGNOSIS — G894 Chronic pain syndrome: Secondary | ICD-10-CM | POA: Diagnosis not present

## 2017-11-24 DIAGNOSIS — M961 Postlaminectomy syndrome, not elsewhere classified: Secondary | ICD-10-CM | POA: Diagnosis not present

## 2017-11-24 DIAGNOSIS — Z79891 Long term (current) use of opiate analgesic: Secondary | ICD-10-CM | POA: Diagnosis not present

## 2017-12-01 DIAGNOSIS — F322 Major depressive disorder, single episode, severe without psychotic features: Secondary | ICD-10-CM | POA: Diagnosis not present

## 2017-12-08 ENCOUNTER — Other Ambulatory Visit: Payer: Self-pay | Admitting: Family Medicine

## 2017-12-08 NOTE — Telephone Encounter (Signed)
Last OV 11/17/2017   Last refilled

## 2017-12-26 DIAGNOSIS — M79671 Pain in right foot: Secondary | ICD-10-CM | POA: Diagnosis not present

## 2017-12-26 DIAGNOSIS — M7741 Metatarsalgia, right foot: Secondary | ICD-10-CM | POA: Diagnosis not present

## 2018-01-26 ENCOUNTER — Other Ambulatory Visit: Payer: Self-pay | Admitting: Family Medicine

## 2018-02-08 DIAGNOSIS — M47812 Spondylosis without myelopathy or radiculopathy, cervical region: Secondary | ICD-10-CM | POA: Diagnosis not present

## 2018-02-08 DIAGNOSIS — M961 Postlaminectomy syndrome, not elsewhere classified: Secondary | ICD-10-CM | POA: Diagnosis not present

## 2018-02-08 DIAGNOSIS — Z79891 Long term (current) use of opiate analgesic: Secondary | ICD-10-CM | POA: Diagnosis not present

## 2018-02-08 DIAGNOSIS — G894 Chronic pain syndrome: Secondary | ICD-10-CM | POA: Diagnosis not present

## 2018-02-09 DIAGNOSIS — F322 Major depressive disorder, single episode, severe without psychotic features: Secondary | ICD-10-CM | POA: Diagnosis not present

## 2018-03-27 ENCOUNTER — Encounter: Payer: Self-pay | Admitting: Family Medicine

## 2018-03-28 ENCOUNTER — Ambulatory Visit: Payer: PPO | Admitting: Family Medicine

## 2018-03-31 ENCOUNTER — Telehealth: Payer: Self-pay | Admitting: *Deleted

## 2018-03-31 MED ORDER — METHYLPREDNISOLONE 4 MG PO TBPK: ORAL_TABLET | ORAL | 0 refills | Status: DC

## 2018-03-31 NOTE — Telephone Encounter (Signed)
Rx done. 

## 2018-03-31 NOTE — Telephone Encounter (Signed)
Noted  

## 2018-03-31 NOTE — Telephone Encounter (Signed)
Call in a Medrol dose pack  

## 2018-03-31 NOTE — Telephone Encounter (Signed)
I called pts wife in regards to medication for the bladder and she asked if Dr Sarajane Jews could send in the same medications for [name] that she was given at the visit today as he has tennis elbow also.  Message sent to Dr Sarajane Jews.

## 2018-04-05 DIAGNOSIS — Z79891 Long term (current) use of opiate analgesic: Secondary | ICD-10-CM | POA: Diagnosis not present

## 2018-04-05 DIAGNOSIS — G894 Chronic pain syndrome: Secondary | ICD-10-CM | POA: Diagnosis not present

## 2018-04-05 DIAGNOSIS — M961 Postlaminectomy syndrome, not elsewhere classified: Secondary | ICD-10-CM | POA: Diagnosis not present

## 2018-04-05 DIAGNOSIS — M47812 Spondylosis without myelopathy or radiculopathy, cervical region: Secondary | ICD-10-CM | POA: Diagnosis not present

## 2018-04-12 ENCOUNTER — Telehealth: Payer: Self-pay | Admitting: *Deleted

## 2018-04-12 ENCOUNTER — Ambulatory Visit (INDEPENDENT_AMBULATORY_CARE_PROVIDER_SITE_OTHER): Payer: PPO | Admitting: *Deleted

## 2018-04-12 DIAGNOSIS — M7711 Lateral epicondylitis, right elbow: Secondary | ICD-10-CM | POA: Diagnosis not present

## 2018-04-12 DIAGNOSIS — Z23 Encounter for immunization: Secondary | ICD-10-CM | POA: Diagnosis not present

## 2018-04-12 DIAGNOSIS — R52 Pain, unspecified: Secondary | ICD-10-CM | POA: Diagnosis not present

## 2018-04-12 NOTE — Telephone Encounter (Signed)
Patient scheduled for MMR vaccine today, however there is no documentation in his chart indicating that he needs this vaccine. Called patient and left message to return call for further info about his history of MMR vaccine. Patient will need to answer the following 4 questions:   If a [facility] patient calls in to request information on measles vaccines, please ask them the below questions.  If they answer "yes" to any of these questions, they should not need another vaccine. 1. Were you born before [birth year]? 2. For those patients considered high risk (college students, healthcare workers, international travelers, and groups at increased risk during outbreaks) do you have documentation of 2 doses of measles and mumps virus - containing vaccine? 3. For all other patients (those not considered high risk) do you have documentation of one dose of measles and mumps virus - containing vaccine?  4. Do you have laboratory confirmation of past infection or had blood tests (titer) that show you are immune to measles, mumps, and rubella?  If they answer "no" to all of the above, please schedule an appointment with their PCP.  Please inform the patient that we are not aware of how insurances are covering the MMR vaccine.  They will need to check with their insurance provider if they would like to verify coverage and their out of pocket expenses.  Our charge for a MMR vaccine is $130.

## 2018-04-12 NOTE — Progress Notes (Signed)
1. Were you born before [birth year]? No 2. For those patients considered high risk (college students, healthcare workers, international travelers, and groups at increased risk during outbreaks) do you have documentation of 2 doses of measles and mumps virus - containing vaccine? Unknown  3. For all other patients (those not considered high risk) do you have documentation of one dose of measles and mumps virus - containing vaccine? Unknown 4. Do you have laboratory confirmation of past infection or had blood tests (titer) that show you are immune to measles, mumps, and rubella? Unknown  Discussed w/ Dr. Sarajane Jews, verbal orders received for MMR vaccine today.  Per orders of Dr. Sarajane Jews, injection of MMR vaccine given by Dorrene German. Patient tolerated injection well.

## 2018-04-12 NOTE — Telephone Encounter (Signed)
See clinical support encounter dated for today.

## 2018-05-04 DIAGNOSIS — F322 Major depressive disorder, single episode, severe without psychotic features: Secondary | ICD-10-CM | POA: Diagnosis not present

## 2018-05-15 DIAGNOSIS — M7711 Lateral epicondylitis, right elbow: Secondary | ICD-10-CM | POA: Diagnosis not present

## 2018-05-15 DIAGNOSIS — R29898 Other symptoms and signs involving the musculoskeletal system: Secondary | ICD-10-CM | POA: Diagnosis not present

## 2018-05-15 DIAGNOSIS — M79601 Pain in right arm: Secondary | ICD-10-CM | POA: Diagnosis not present

## 2018-05-15 DIAGNOSIS — M6289 Other specified disorders of muscle: Secondary | ICD-10-CM | POA: Diagnosis not present

## 2018-05-30 ENCOUNTER — Encounter: Payer: Self-pay | Admitting: Family Medicine

## 2018-06-01 MED ORDER — SCOPOLAMINE 1 MG/3DAYS TD PT72: 1.0000 | MEDICATED_PATCH | TRANSDERMAL | 2 refills | Status: DC

## 2018-06-01 NOTE — Telephone Encounter (Signed)
Call in transdermal scopolamine patches to use one every 3 days, one box of 10, with 2  rf

## 2018-06-06 DIAGNOSIS — Z79891 Long term (current) use of opiate analgesic: Secondary | ICD-10-CM | POA: Diagnosis not present

## 2018-06-06 DIAGNOSIS — M961 Postlaminectomy syndrome, not elsewhere classified: Secondary | ICD-10-CM | POA: Diagnosis not present

## 2018-06-06 DIAGNOSIS — G894 Chronic pain syndrome: Secondary | ICD-10-CM | POA: Diagnosis not present

## 2018-06-06 DIAGNOSIS — M47812 Spondylosis without myelopathy or radiculopathy, cervical region: Secondary | ICD-10-CM | POA: Diagnosis not present

## 2018-06-13 DIAGNOSIS — M7751 Other enthesopathy of right foot: Secondary | ICD-10-CM | POA: Diagnosis not present

## 2018-06-13 DIAGNOSIS — M7741 Metatarsalgia, right foot: Secondary | ICD-10-CM | POA: Diagnosis not present

## 2018-07-10 ENCOUNTER — Other Ambulatory Visit: Payer: Self-pay

## 2018-07-10 NOTE — Patient Outreach (Signed)
[facility]) Care Management  07/10/2018  [name] 1961-06-03 [id number]   Telephone Screen  Referral Date: 07/10/18 Referral Source: [employer] COncierge Referral Reason: " member wanting assistance with co pays for meds, member wants to participate in opioids but unable to afford co pays" Insurance: [employer]   Outreach attempt #1 to patient. Spoke with male who reported patient was not available at this time but would be home soon. RN CM left contact info with caller so that patient can return RN CM call.       Plan: RN CM will make outreach attempt to patient within 3-4 business days. RN CM will send unsuccessful outreach letter to patient.    Enzo Montgomery, RN,BSN,CCM [facility] Telephonic Care Management Coordinator Direct Phone: [phone] Toll Free: [phone] Fax: [phone]

## 2018-07-13 ENCOUNTER — Other Ambulatory Visit: Payer: Self-pay

## 2018-07-13 NOTE — Patient Outreach (Signed)
[facility]) Care Management  07/13/2018  RILYN UPSHAW 08/27/1961 [id number]   Telephone Screen  Referral Date: 07/10/18 Referral Source: [employer] Concierge Referral Reason: " member wanting assistance with co pays for meds, member wants to participate in opioids but unable to afford co pays" Insurance: [employer]    Outreach attempt #2 to patient. No answer at present and unable to leave message.     Plan: RN CM will make outreach attempt to patient within 3-4 business days.   Enzo Montgomery, RN,BSN,CCM [place] [facility] Telephonic Care Management Coordinator Direct Phone: [phone] Toll Free: [phone] Fax: [phone]

## 2018-07-17 ENCOUNTER — Other Ambulatory Visit: Payer: Self-pay

## 2018-07-17 NOTE — Patient Outreach (Signed)
[facility]) Care Management  07/17/2018  [name] 01/03/1961 [id number]   Telephone Screen  Referral Date:07/10/18 Referral Source:[employer] Concierge Referral Reason:" member wanting assistance with co pays for meds, member wants to participate in opioids but unable to afford co pays" Insurance:[employer]   Outreach attempt #3 to patient. No answer at present.     Plan: RN CM will close case if no response from letter mailed to patient.  Enzo Montgomery, RN,BSN,CCM [place] [facility] Telephonic Care Management Coordinator Direct Phone: [phone] Toll Free: [phone] Fax: [phone]

## 2018-07-18 ENCOUNTER — Other Ambulatory Visit: Payer: Self-pay | Admitting: Family Medicine

## 2018-07-21 ENCOUNTER — Ambulatory Visit (INDEPENDENT_AMBULATORY_CARE_PROVIDER_SITE_OTHER): Payer: PPO | Admitting: Adult Health

## 2018-07-21 ENCOUNTER — Encounter: Payer: Self-pay | Admitting: Adult Health

## 2018-07-21 VITALS — BP 120/90 | Temp 98.1°F | Wt 167.0 lb

## 2018-07-21 DIAGNOSIS — J069 Acute upper respiratory infection, unspecified: Secondary | ICD-10-CM | POA: Diagnosis not present

## 2018-07-21 MED ORDER — DOXYCYCLINE HYCLATE 100 MG PO CAPS: 100.0000 mg | ORAL_CAPSULE | Freq: Two times a day (BID) | ORAL | 0 refills | Status: DC

## 2018-07-21 MED ORDER — PREDNISONE 20 MG PO TABS: 20.0000 mg | ORAL_TABLET | Freq: Every day | ORAL | 0 refills | Status: DC

## 2018-07-21 NOTE — Progress Notes (Signed)
Subjective:    Patient ID: [name], male    DOB: 1960/10/02, 57 y.o.   MRN: [id number]  Was treated by an online doctor on 07/06/2018 with 7 day course of Augmentin and Flonase. His symptoms did not resolve   URI   This is a new problem. The current episode started 1 to 4 weeks ago (3 weeks ). There has been no fever. Associated symptoms include congestion, coughing, headaches, a plugged ear sensation, rhinorrhea, sinus pain and a sore throat. Pertinent negatives include no wheezing. The treatment provided mild relief.    Review of Systems  Constitutional: Positive for appetite change and fatigue. Negative for diaphoresis and fever.  HENT: Positive for congestion, postnasal drip, rhinorrhea, sinus pain and sore throat. Negative for trouble swallowing.   Respiratory: Positive for cough. Negative for wheezing.   Cardiovascular: Negative.   Neurological: Positive for headaches.   Past Medical History:  Diagnosis Date  . ADHD (attention deficit hyperactivity disorder), inattentive type    sees Dr. Celesta Aver in [place], [place]   . Anxiety    sees Dr. Celesta Aver in [place], [place]   . Arthritis   . Benign enlargement of prostate    sees [facility]   . Carpal tunnel syndrome on left    sees Dr. Eugene Garnet at [facility]   . Chronic pain    sees Dr. Nicholaus Bloom (meds) and [facility] (accupuncture)   . Depression   . Functional impotence   . Hypogonadism in male    sees Larene Beach (a [place]) at [facility]   . Insomnia   . Kidney stones   . Sinusitis, acute 07/17/2016  . 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 Marital status: Married    Spouse name: Not on file  . Number of children: Not on file  . Years of education: Not on file  . Highest education level: Not on file  Occupational History  . Not on file  Social Needs  . Financial resource strain: Not on file  . Food insecurity:    Worry: Not on file    Inability: Not  on file  . Transportation needs:    Medical: Not on file    Non-medical: Not on file  Tobacco Use  . Smoking status: Former Research scientist (life sciences)  . Smokeless tobacco: Never Used  . Tobacco comment: quit 35 years  Substance and Sexual Activity  . Alcohol use: Yes    Alcohol/week: 0.0 standard drinks    Comment: occasional  . Drug use: No  . Sexual activity: Yes    Birth control/protection: Condom  Lifestyle  . Physical activity:    Days per week: Not on file    Minutes per session: Not on file  . Stress: Not on file  Relationships  . Social connections:    Talks on phone: Not on file    Gets together: Not on file    Attends religious service: Not on file    Active member of club or organization: Not on file    Attends meetings of clubs or organizations: Not on file    Relationship status: Not on file  . Intimate partner violence:    Fear of current or ex partner: Not on file    Emotionally abused: Not on file    Physically abused: Not on file    Forced sexual activity: Not on file  Other Topics Concern  . Not on file  Social  History Narrative  . Not on file    Past Surgical History:  Procedure Laterality Date  . APPENDECTOMY    . CERVICAL FUSION    . CERVICAL LAMINECTOMY    . COLONOSCOPY  11/06/2009   per Dr. Teena Irani, no polyps, repeat in 10 yrs   . LUMBAR DISC SURGERY     x 3    Family History  Problem Relation Age of Onset  . Heart disease Other   . Diabetes Other   . Hyperlipidemia Other   . Hypertension Sister   . Prostate cancer Neg Hx   . Kidney disease Neg Hx     Allergies  Allergen Reactions  . Codeine     REACTION: ITCHING  . Cyclobenzaprine Other (See Comments)    Nightmares  . Dulcolax Stool Softener [Dss]     swelling  . Gabapentin     migraine  . Ketorolac     Other reaction(s): Vomiting (intolerance)  . Ketorolac Tromethamine Other (See Comments)    REACTION: SEVERE VOMMITING  . Lisinopril     Cough & chest tightness    Current  Outpatient Medications on File Prior to Visit  Medication Sig Dispense Refill  . amLODipine (NORVASC) 10 MG tablet TAKE 1 TABLET BY MOUTH DAILY 90 tablet 1  . amphetamine-dextroamphetamine (ADDERALL) 10 MG tablet Take 0.5 tablets (5 mg total) by mouth daily with breakfast. 30 tablet 0  . cetirizine (ZYRTEC) 10 MG tablet Take 10 mg by mouth daily.      . diazepam (VALIUM) 10 MG tablet Take 10 mg by mouth every 6 (six) hours as needed.      . diclofenac sodium (VOLTAREN) 1 % GEL Apply 1 application topically as needed.      . fentaNYL (DURAGESIC - DOSED MCG/HR) 50 MCG/HR Place 50 mcg onto the skin every other day.    . fluticasone (FLONASE) 50 MCG/ACT nasal spray USE 2 SPRAYS IN EACH NOSTRIL DAILY 16 g 6  . hydrocortisone (ANUSOL-HC) 2.5 % rectal cream USE AS DIRECTED 3 TIMES A DAY AS NEEDED 30 g 3  . ketoconazole (NIZORAL) 200 MG tablet Take 1 tablet (200 mg total) by mouth daily. 90 tablet 0  . lactulose (CHRONULAC) 10 GM/15ML solution     . losartan-hydrochlorothiazide (HYZAAR) 50-12.5 MG tablet Take 1 tablet by mouth daily. 90 tablet 3  . oxyCODONE (OXY IR/ROXICODONE) 5 MG immediate release tablet TAKE 1 TABLET BY MOUTH EVERY SIX HOURS AS NEEDED FOR PAIN  0  . potassium citrate (UROCIT-K) 10 MEQ (1080 MG) SR tablet Take 1 tablet (10 mEq total) by mouth daily. 90 tablet 3  . pramoxine (PROCTOFOAM) 1 % foam Place 1 application rectally 3 (three) times daily as needed for itching. 15 g 0  . PROCTOFOAM HC rectal foam USE AS DIRECTED 3 TIMES A DAY AS NEEDED 10 g 5  . promethazine (PHENERGAN) 25 MG tablet Reported on 10/30/2015    . scopolamine (TRANSDERM-SCOP, 1.5 MG,) 1 MG/3DAYS Place 1 patch (1.5 mg total) onto the skin every 3 (three) days. 10 patch 2  . traZODone (DESYREL) 100 MG tablet Take 50 mg by mouth at bedtime.     . triamcinolone cream (KENALOG) 0.1 % Apply 1 application topically 2 (two) times daily. 30 g 0  . valACYclovir (VALTREX) 500 MG tablet TAKE 1 TABLET BY MOUTH TWICE A DAY 180  tablet 2   No current facility-administered medications on file prior to visit.     BP 120/90   Temp  98.1 F (36.7 C) (Oral)   Wt 167 lb (75.8 kg)   BMI 25.02 kg/m       Objective:   Physical Exam  Constitutional: He appears well-developed and well-nourished. No distress.  HENT:  Head: Normocephalic and atraumatic.  Right Ear: Hearing, tympanic membrane, external ear and ear canal normal.  Left Ear: Hearing, tympanic membrane, external ear and ear canal normal.  Nose: Mucosal edema and rhinorrhea present. Right sinus exhibits no maxillary sinus tenderness and no frontal sinus tenderness. Left sinus exhibits no maxillary sinus tenderness and no frontal sinus tenderness.  Mouth/Throat: Oropharynx is clear and moist. No oropharyngeal exudate.  Eyes: Pupils are equal, round, and reactive to light. Conjunctivae and EOM are normal.  Neck: Normal range of motion. Neck supple.  Cardiovascular: Normal rate, regular rhythm, normal heart sounds and intact distal pulses.  Pulmonary/Chest: Effort normal. He has wheezes (trace wheezing throughout ).  Skin: He is not diaphoretic.  Nursing note and vitals reviewed.     Assessment & Plan:  1. Upper respiratory tract infection, unspecified type - Possible abx failure.  - doxycycline (VIBRAMYCIN) 100 MG capsule; Take 1 capsule (100 mg total) by mouth 2 (two) times daily.  Dispense: 14 capsule; Refill: 0 - predniSONE (DELTASONE) 20 MG tablet; Take 1 tablet (20 mg total) by mouth daily with breakfast.  Dispense: 14 tablet; Refill: 0 - Follow up if no improvement   Dorothyann Peng, NP

## 2018-07-23 ENCOUNTER — Other Ambulatory Visit: Payer: Self-pay

## 2018-07-23 NOTE — Patient Outreach (Signed)
[facility]) Care Management  07/23/2018  [name] March 19, 1961 [id number]   Telephone Screen  Referral Date:07/10/18 Referral Source:[employer] Concierge Referral Reason:" member wanting assistance with co pays for meds, member wants to participate in opioids but unable to afford co pays" Insurance:HTA    Multiple attempts to establish contact with patient without success. No response from letter mailed to patient. Case is being closed at this time.     Plan: RN CM will close case at this time.   Enzo Montgomery, RN,BSN,CCM [place] Management Telephonic Care Management Coordinator Direct Phone: [phone] Toll Free: [phone] Fax: [phone]

## 2018-07-26 ENCOUNTER — Ambulatory Visit: Payer: Self-pay | Admitting: *Deleted

## 2018-07-26 NOTE — Telephone Encounter (Signed)
[name] tripped over a pillow during the night. His nose began to bleed after he fell on to a table. No cut/laceration/LOC. Has some soreness on the right side of the bridge on his nose as well as hip and foot soreness from the fall.  No difficulty stopping the bleeding. Some mild swelling of the area. He wiped his nose today and it began bleeding, again. No clear fluid draining that he is aware of. No difficulty stopping the bleeding today. Denies difficulty breathing through the injured side of his nose. Reports he sees a red bump on the inside of the right nostril where it's sore. No deformation of his nose is noticeable, when pressing the tip of nose upwards he denies severe tenderness. Recommended ice today and tomorrow as stated in protocol. S/sx to be aware of for a septal hematoma.  Reason for Disposition . Nose swelling, bruise or pain  Answer Assessment - Initial Assessment Questions 1. MECHANISM: "How did the injury happen?"      Tripped over pillow in the floor  2. ONSET: "When did the injury happen?" (Minutes or hours ago)      During the night last night. 3. LOCATION: "What part of the nose is injured?"      Right side of bridge of nose 4. APPEARANCE of INJURY: "What does the nose look like?"      Bruised. Bleeding again today when he wiped his nose. Can see a bump in the inside of his nose.  5. BLEEDING: "Is the nose still bleeding?" If so, ask: "Is it difficult to stop?"      no 6. SIZE: For cuts, bruises, or swelling, ask: "How large is it?" (e.g., inches or centimeters;  entire nose)      No cut no swelling. 7. PAIN: "Is it painful?" If so, ask: "How bad is the pain?"   (Scale 1-10; or mild, moderate, severe)     1 inside of his nose.  8. TETANUS: For any breaks in the skin, ask: "When was the last tetanus booster?"     na 9. OTHER SYMPTOMS: "Do you have any other symptoms?" (e.g., headache, neck pain, loss of consciousness)     Sore neck and arm this morning from the  fall. 10. PREGNANCY: "Is there any chance you are pregnant?" "When was your last menstrual period?"      no  Protocols used: NOSE INJURY-A-AH

## 2018-07-28 ENCOUNTER — Encounter: Payer: Self-pay | Admitting: Family Medicine

## 2018-07-28 ENCOUNTER — Ambulatory Visit (INDEPENDENT_AMBULATORY_CARE_PROVIDER_SITE_OTHER): Payer: PPO | Admitting: Family Medicine

## 2018-07-28 VITALS — BP 120/80 | HR 109 | Temp 98.1°F | Wt 170.0 lb

## 2018-07-28 DIAGNOSIS — S8010XA Contusion of unspecified lower leg, initial encounter: Secondary | ICD-10-CM | POA: Diagnosis not present

## 2018-07-28 DIAGNOSIS — S7010XA Contusion of unspecified thigh, initial encounter: Secondary | ICD-10-CM | POA: Diagnosis not present

## 2018-07-28 DIAGNOSIS — W19XXXA Unspecified fall, initial encounter: Secondary | ICD-10-CM

## 2018-07-28 NOTE — Progress Notes (Signed)
[name] DOB: [birth year] Encounter date: 07/28/2018  This is a 57 y.o. male who presents with Chief Complaint  Patient presents with  . Headache    red spot inside of nose, brused leg and thigh, tripped ovr a pillow, hit nose when he fell, caused a nose bleed. states that nose bleed if touch for the next couple days, right side of nose    History of present illness:  Sleeps with pillow between legs. As far as he can tell pillow fell near end of bed. Thinks he tripped over pillow, but face went right into glass table. Has large, deep bruis left thigh; Has "jelly bean" in upper right side of nose. Headache. Worried about "thing in nose". Happened 2 nights ago.   Nose hurts now. Head hurts right temple. Nose hurting just a little. Headache has progressively gotten worse. Taking tylenol to hlep with pain. Right now head is 4/10 after taking tylenol. Resting helps somewhat.   Had another fall first of October; tripping over luggage from coming back from trip. Hit head harder.     Allergies  Allergen Reactions  . Codeine     REACTION: ITCHING  . Cyclobenzaprine Other (See Comments)    Nightmares  . Dulcolax Stool Softener [Dss]     swelling  . Gabapentin     migraine  . Ketorolac     Other reaction(s): Vomiting (intolerance)  . Ketorolac Tromethamine Other (See Comments)    REACTION: SEVERE VOMMITING  . Lisinopril     Cough & chest tightness  . Lyrica [Pregabalin]     headaches   Current Meds  Medication Sig  . amLODipine (NORVASC) 10 MG tablet TAKE 1 TABLET BY MOUTH DAILY  . amphetamine-dextroamphetamine (ADDERALL) 10 MG tablet Take 0.5 tablets (5 mg total) by mouth daily with breakfast.  . cetirizine (ZYRTEC) 10 MG tablet Take 10 mg by mouth daily.    . diazepam (VALIUM) 10 MG tablet Take 10 mg by mouth every 6 (six) hours as needed.    . diclofenac sodium (VOLTAREN) 1 % GEL Apply 1 application topically as needed.    . doxycycline (VIBRAMYCIN) 100 MG capsule  Take 1 capsule (100 mg total) by mouth 2 (two) times daily.  . fentaNYL (DURAGESIC - DOSED MCG/HR) 50 MCG/HR Place 50 mcg onto the skin every other day.  . fluticasone (FLONASE) 50 MCG/ACT nasal spray USE 2 SPRAYS IN EACH NOSTRIL DAILY  . hydrocortisone (ANUSOL-HC) 2.5 % rectal cream USE AS DIRECTED 3 TIMES A DAY AS NEEDED  . ketoconazole (NIZORAL) 200 MG tablet Take 1 tablet (200 mg total) by mouth daily.  Marland Kitchen lactulose (CHRONULAC) 10 GM/15ML solution   . losartan-hydrochlorothiazide (HYZAAR) 50-12.5 MG tablet Take 1 tablet by mouth daily.  Marland Kitchen oxyCODONE (OXY IR/ROXICODONE) 5 MG immediate release tablet TAKE 1 TABLET BY MOUTH EVERY SIX HOURS AS NEEDED FOR PAIN  . potassium citrate (UROCIT-K) 10 MEQ (1080 MG) SR tablet Take 1 tablet (10 mEq total) by mouth daily.  . pramoxine (PROCTOFOAM) 1 % foam Place 1 application rectally 3 (three) times daily as needed for itching.  . predniSONE (DELTASONE) 20 MG tablet Take 1 tablet (20 mg total) by mouth daily with breakfast.  . PROCTOFOAM HC rectal foam USE AS DIRECTED 3 TIMES A DAY AS NEEDED  . promethazine (PHENERGAN) 25 MG tablet Reported on 10/30/2015  . scopolamine (TRANSDERM-SCOP, 1.5 MG,) 1 MG/3DAYS Place 1 patch (1.5 mg total) onto the skin every 3 (three) days.  . traZODone (DESYREL) 100  MG tablet Take 50 mg by mouth at bedtime.   . triamcinolone cream (KENALOG) 0.1 % Apply 1 application topically 2 (two) times daily.  . valACYclovir (VALTREX) 500 MG tablet TAKE 1 TABLET BY MOUTH TWICE A DAY    Review of Systems  HENT: Positive for congestion (right side nose somewhat congested, but still moving air) and nosebleeds (no bleeding in last 24 hours). Negative for ear discharge, sinus pressure and sinus pain.   Respiratory: Negative for chest tightness and shortness of breath.   Cardiovascular: Negative for chest pain.  Neurological: Positive for headaches (mild right temporoal headache). Negative for dizziness, syncope, speech difficulty and  light-headedness.    Objective:  BP 120/80 (BP Location: Left Arm, Patient Position: Sitting, Cuff Size: Normal)   Pulse (!) 109   Temp 98.1 F (36.7 C) (Oral)   Wt 170 lb (77.1 kg)   SpO2 96%   BMI 25.47 kg/m   Weight: 170 lb (77.1 kg)   BP Readings from Last 3 Encounters:  07/28/18 120/80  07/21/18 120/90  11/17/17 122/80   Wt Readings from Last 3 Encounters:  07/28/18 170 lb (77.1 kg)  07/21/18 167 lb (75.8 kg)  11/17/17 172 lb 12.8 oz (78.4 kg)    Physical Exam  Constitutional: He is oriented to person, place, and time. He appears well-developed and well-nourished. No distress.  HENT:  Right Ear: Tympanic membrane, external ear and ear canal normal.  Left Ear: Tympanic membrane, external ear and ear canal normal.  Nose: Mucosal edema (bilat turbinate hypertrophy) present. No sinus tenderness or nasal deformity. No epistaxis.  Mouth/Throat: Uvula is midline, oropharynx is clear and moist and mucous membranes are normal.  There is small scab inside right nares just below first turbinate.  Eyes: Pupils are equal, round, and reactive to light. Conjunctivae are normal.  Cardiovascular: Normal rate, regular rhythm and normal heart sounds. Exam reveals no friction rub.  No murmur heard. No lower extremity edema  Pulmonary/Chest: Effort normal and breath sounds normal. No respiratory distress. He has no wheezes. He has no rales.  Neurological: He is alert and oriented to person, place, and time. He displays a negative Romberg sign. Gait normal.  Skin:  There is light bruising left anterior thigh - yellow in color. There is hematoma left thigh approx 3cm in diameter.  Psychiatric: His behavior is normal. Cognition and memory are normal.    Assessment/Plan 1. Fall, initial encounter Recommended brain rest due to headache. If worsening of headache let us know. We looked at nose together and I suspect he was seeing a blood clot from nasal injury (which has resolved) or seeing  his slightly enlarged turbinates. I do not see anything concerning in nose on exam.    Let us know if any worsening of head pain in next few days or not resolving in next week. Limit brain irritators (TV, electronics, noise, light). You need to be seen if dizzyness, worsening headache, coordination issues or if nose bleeding recurs.   Return if symptoms worsen or fail to improve.    Micheline Rough, MD

## 2018-07-28 NOTE — Patient Instructions (Signed)
Let us know if any worsening of head pain in next few days or not resolving in next week. Limit brain irritators (TV, electronics, noise, light). You need to be seen if dizzyness, worsening headache, coordination issues.

## 2018-08-01 DIAGNOSIS — G894 Chronic pain syndrome: Secondary | ICD-10-CM | POA: Diagnosis not present

## 2018-08-01 DIAGNOSIS — M47812 Spondylosis without myelopathy or radiculopathy, cervical region: Secondary | ICD-10-CM | POA: Diagnosis not present

## 2018-08-01 DIAGNOSIS — M961 Postlaminectomy syndrome, not elsewhere classified: Secondary | ICD-10-CM | POA: Diagnosis not present

## 2018-08-01 DIAGNOSIS — Z79891 Long term (current) use of opiate analgesic: Secondary | ICD-10-CM | POA: Diagnosis not present

## 2018-08-03 DIAGNOSIS — F322 Major depressive disorder, single episode, severe without psychotic features: Secondary | ICD-10-CM | POA: Diagnosis not present

## 2018-08-16 DIAGNOSIS — M47812 Spondylosis without myelopathy or radiculopathy, cervical region: Secondary | ICD-10-CM | POA: Diagnosis not present

## 2018-08-16 DIAGNOSIS — G894 Chronic pain syndrome: Secondary | ICD-10-CM | POA: Diagnosis not present

## 2018-08-16 DIAGNOSIS — M545 Low back pain: Secondary | ICD-10-CM | POA: Diagnosis not present

## 2018-08-29 ENCOUNTER — Encounter: Payer: Self-pay | Admitting: Family Medicine

## 2018-08-29 NOTE — Telephone Encounter (Signed)
Dr. Fry please advise. Thanks  

## 2018-09-01 NOTE — Telephone Encounter (Signed)
I suggest he see Dr. Verlin Dike for any spinal issues

## 2018-09-26 DIAGNOSIS — Z79891 Long term (current) use of opiate analgesic: Secondary | ICD-10-CM | POA: Diagnosis not present

## 2018-09-26 DIAGNOSIS — G894 Chronic pain syndrome: Secondary | ICD-10-CM | POA: Diagnosis not present

## 2018-09-26 DIAGNOSIS — M961 Postlaminectomy syndrome, not elsewhere classified: Secondary | ICD-10-CM | POA: Diagnosis not present

## 2018-09-26 DIAGNOSIS — M5416 Radiculopathy, lumbar region: Secondary | ICD-10-CM | POA: Diagnosis not present

## 2018-10-02 ENCOUNTER — Ambulatory Visit: Payer: Self-pay

## 2018-10-02 NOTE — Telephone Encounter (Signed)
Pt called stating that he has had multiple back surgeries and is now experiencing numbness in his left foot and toes.  He says the color of his foot is normal and it is cool but it is always cool. He states his symptoms started 2 weeks ago. He also has some sinus issues.  Appointment scheduled per protocol.  Care advice read to patient.  Pt verbalized understanding of all instructions.  Reason for Disposition . [1] Numbness or tingling on both sides of body AND [2] is a new symptom present > 24 hours    Left side foot and toes are numb  Answer Assessment - Initial Assessment Questions 1. SYMPTOM: "What is the main symptom you are concerned about?" (e.g., weakness, numbness)     Numbness to left foot 2. ONSET: "When did this start?" (minutes, hours, days; while sleeping)     2 weeks ago 3. LAST NORMAL: "When was the last time you were normal (no symptoms)?"     2 weeks ago 4. PATTERN "Does this come and go, or has it been constant since it started?"  "Is it present now?"     Constant for 2 weeks 5. CARDIAC SYMPTOMS: "Have you had any of the following symptoms: chest pain, difficulty breathing, palpitations?"     no 6. NEUROLOGIC SYMPTOMS: "Have you had any of the following symptoms: headache, dizziness, vision loss, double vision, changes in speech, unsteady on your feet?"     no 7. OTHER SYMPTOMS: "Do you have any other symptoms?"     Back  Pain that is being treated. 8. PREGNANCY: "Is there any chance you are pregnant?" "When was your last menstrual period?"     no  Protocols used: NEUROLOGIC DEFICIT-A-AH

## 2018-10-03 ENCOUNTER — Ambulatory Visit (INDEPENDENT_AMBULATORY_CARE_PROVIDER_SITE_OTHER): Payer: PPO | Admitting: Family Medicine

## 2018-10-03 ENCOUNTER — Encounter: Payer: Self-pay | Admitting: Family Medicine

## 2018-10-03 VITALS — BP 120/84 | HR 106 | Temp 98.5°F | Resp 12 | Ht 68.5 in | Wt 163.4 lb

## 2018-10-03 DIAGNOSIS — M545 Low back pain, unspecified: Secondary | ICD-10-CM

## 2018-10-03 DIAGNOSIS — J040 Acute laryngitis: Secondary | ICD-10-CM

## 2018-10-03 DIAGNOSIS — I10 Essential (primary) hypertension: Secondary | ICD-10-CM

## 2018-10-03 DIAGNOSIS — R000 Tachycardia, unspecified: Secondary | ICD-10-CM

## 2018-10-03 DIAGNOSIS — R208 Other disturbances of skin sensation: Secondary | ICD-10-CM

## 2018-10-03 DIAGNOSIS — J069 Acute upper respiratory infection, unspecified: Secondary | ICD-10-CM | POA: Diagnosis not present

## 2018-10-03 DIAGNOSIS — R200 Anesthesia of skin: Secondary | ICD-10-CM

## 2018-10-03 LAB — VITAMIN B12: Vitamin B-12: 311 pg/mL (ref 211–911)

## 2018-10-03 MED ORDER — PREDNISONE 20 MG PO TABS: ORAL_TABLET | ORAL | 0 refills | Status: DC

## 2018-10-03 NOTE — Patient Instructions (Addendum)
  [name] I have seen you today for an acute visit.  A few things to remember from today's visit:   Numbness of left foot - Plan: MR Lumbar Spine Wo Contrast, predniSONE (DELTASONE) 20 MG tablet, Ambulatory referral to Neurosurgery, Vitamin B12  Lumbar back pain - Plan: MR Lumbar Spine Wo Contrast, Ambulatory referral to Neurosurgery  Sinus tachycardia  URI, acute  Laryngitis, acute  viral infections are self-limited and we treat each symptom depending of severity.  Over the counter medications as decongestants and cold medications usually help, they need to be taken with caution if there is a history of high blood pressure or palpitations. Tylenol and/or Ibuprofen also helps with most symptoms (headache, muscle aching, fever,etc) Plenty of fluids. Honey helps with cough. Steam inhalations helps with runny nose, nasal congestion, and may prevent sinus infections. Cough and nasal congestion could last a few days and sometimes weeks.   Voice rest.  If medications prescribed today, they will not be refill upon request, a follow up appointment with PCP will be necessary to discuss continuation of of treatment if appropriate.     In general please monitor for signs of worsening symptoms and seek immediate medical attention if any concerning.    I hope you get better soon!

## 2018-10-03 NOTE — Progress Notes (Signed)
ACUTE VISIT   HPI:  Chief Complaint  Patient presents with  . Left foot numbness    started 2 weeks ago  . URI  . Laryngitis    [name] is a 58 y.o. male with Hx of chronic pain,HTN, and insomnia is here today complaining of 2 weeks of left foot numbness that seems to be getting worse. Initially affecting medial aspect of arch and toes, now it is the whole foot. Problem is constant.  He denies associated weakness. No prior Hx of left foot numbness,this is a new problem. He has Hx of right foot numbness residual from foot surgery and stable.  Occasionally numbness left fingers.  He has not identified exacerbating or alleviating factors.  Lower back pain,intermittent,severe muscle spasms.Pain is "not bad" now.  He has had 3 lumbar spine surgeries and 1 cervical. Denies saddle anesthesia or urine/bowel incontinence.  Negative for frequent tripping or falls.  He has not tried OTC treatment.  Lab Results  Component Value Date   CREATININE 1.15 11/17/2017   BUN 15 11/17/2017   NA 138 11/17/2017   K 4.5 11/17/2017   CL 99 11/17/2017   CO2 33 (H) 11/17/2017   Lab Results  Component Value Date   TSH 2.76 11/17/2017   Lab Results  Component Value Date   VITAMINB12 239 10/15/2015    -Also c/o 3 days of "cold" like symptoms. Productive cough with yellowish sputum,denies hemoptysis.  + Rhinorrhea,nasla congestion,and dysphonia. Negative for fever or chills.  Denies wheezing,dyspnea,or sore throat.  No Hx of sick contact or recent travel. He has tried OTC cold meds.  Hx of allergic rhinitis, he takes Zyrtec and Flonase nasal spray prn.   -Noted elevated HR. He denies chest pain, palpitation, dyspnea. He wonders if this is happening because he discontinued one off his BP medications, losartan-HCTZ 50-12.5 mg.  BP was running "low."  BP has been "good." Currently he is on amlodipine 10 mg daily.   Review of Systems  Constitutional:  Positive for fatigue. Negative for activity change, appetite change, chills and fever.  HENT: Positive for congestion, postnasal drip, rhinorrhea, sinus pressure and voice change. Negative for ear pain, facial swelling, mouth sores, sore throat and trouble swallowing.   Eyes: Negative for discharge and redness.  Respiratory: Positive for cough. Negative for chest tightness, shortness of breath and wheezing.   Cardiovascular: Negative for leg swelling.  Gastrointestinal: Negative for abdominal pain, diarrhea, nausea and vomiting.  Genitourinary: Negative for decreased urine volume, dysuria and hematuria.  Musculoskeletal: Positive for back pain. Negative for myalgias.  Skin: Negative for rash.  Allergic/Immunologic: Positive for environmental allergies.  Neurological: Positive for numbness. Negative for weakness and headaches.  Hematological: Negative for adenopathy. Does not bruise/bleed easily.  Psychiatric/Behavioral: The patient is nervous/anxious.       Current Outpatient Medications on File Prior to Visit  Medication Sig Dispense Refill  . amLODipine (NORVASC) 10 MG tablet TAKE 1 TABLET BY MOUTH DAILY 90 tablet 1  . amphetamine-dextroamphetamine (ADDERALL) 10 MG tablet Take 0.5 tablets (5 mg total) by mouth daily with breakfast. 30 tablet 0  . cetirizine (ZYRTEC) 10 MG tablet Take 10 mg by mouth daily.      . diazepam (VALIUM) 10 MG tablet Take 10 mg by mouth every 6 (six) hours as needed.      . diclofenac sodium (VOLTAREN) 1 % GEL Apply 1 application topically as needed.      . doxycycline (VIBRAMYCIN) 100 MG capsule  Take 1 capsule (100 mg total) by mouth 2 (two) times daily. 14 capsule 0  . EPINEPHrine 0.3 mg/0.3 mL IJ SOAJ injection Inject into the muscle.    . fentaNYL (DURAGESIC - DOSED MCG/HR) 50 MCG/HR Place 50 mcg onto the skin every other day.    . fluticasone (FLONASE) 50 MCG/ACT nasal spray USE 2 SPRAYS IN EACH NOSTRIL DAILY 16 g 6  . hydrocortisone (ANUSOL-HC) 2.5 %  rectal cream USE AS DIRECTED 3 TIMES A DAY AS NEEDED 30 g 3  . ketoconazole (NIZORAL) 200 MG tablet Take 1 tablet (200 mg total) by mouth daily. 90 tablet 0  . lactulose (CHRONULAC) 10 GM/15ML solution     . oxyCODONE (OXY IR/ROXICODONE) 5 MG immediate release tablet TAKE 1 TABLET BY MOUTH EVERY SIX HOURS AS NEEDED FOR PAIN  0  . potassium citrate (UROCIT-K) 10 MEQ (1080 MG) SR tablet Take 1 tablet (10 mEq total) by mouth daily. 90 tablet 3  . pramoxine (PROCTOFOAM) 1 % foam Place 1 application rectally 3 (three) times daily as needed for itching. 15 g 0  . predniSONE (DELTASONE) 20 MG tablet Take 1 tablet (20 mg total) by mouth daily with breakfast. 14 tablet 0  . PROCTOFOAM HC rectal foam USE AS DIRECTED 3 TIMES A DAY AS NEEDED 10 g 5  . promethazine (PHENERGAN) 25 MG tablet Reported on 10/30/2015    . scopolamine (TRANSDERM-SCOP, 1.5 MG,) 1 MG/3DAYS Place 1 patch (1.5 mg total) onto the skin every 3 (three) days. 10 patch 2  . traZODone (DESYREL) 100 MG tablet Take 50 mg by mouth at bedtime.     . triamcinolone cream (KENALOG) 0.1 % Apply 1 application topically 2 (two) times daily. 30 g 0  . valACYclovir (VALTREX) 500 MG tablet TAKE 1 TABLET BY MOUTH TWICE A DAY 180 tablet 2  . losartan-hydrochlorothiazide (HYZAAR) 50-12.5 MG tablet Take 1 tablet by mouth daily. (Patient not taking: Reported on 10/03/2018) 90 tablet 3   No current facility-administered medications on file prior to visit.      Past Medical History:  Diagnosis Date  . ADHD (attention deficit hyperactivity disorder), inattentive type    sees Dr. Celesta Aver in [place], [place]   . Anxiety    sees Dr. Celesta Aver in [place], [place]   . Arthritis   . Benign enlargement of prostate    sees [facility]   . Carpal tunnel syndrome on left    sees Dr. Eugene Garnet at [facility]   . Chronic pain    sees Dr. Nicholaus Bloom (meds) and [facility] (accupuncture)   . Depression   . Functional impotence   . Hypogonadism in male      sees Larene Beach (a [place]) at [facility]   . Insomnia   . Kidney stones   . Sinusitis, acute 07/17/2016  . Urticaria due to cold    Allergies  Allergen Reactions  . [employer] Anaphylaxis    Has Epi pen Has Epi pen   . Codeine     REACTION: ITCHING  . Cyclobenzaprine Other (See Comments)    Nightmares  . Docusate Sodium     swelling  . Dulcolax Stool Softener [Dss]     swelling  . Gabapentin     migraine  . Ketorolac     Other reaction(s): Vomiting (intolerance)  . Ketorolac Tromethamine Other (See Comments)    REACTION: SEVERE VOMMITING  . Lisinopril     Cough & chest tightness  . Pregabalin  headaches Other reaction(s): Confusion (intolerance)    Social History   Socioeconomic History  . Marital status: Married    Spouse name: Not on file  . Number of children: Not on file  . Years of education: Not on file  . Highest education level: Not on file  Occupational History  . Not on file  Social Needs  . Financial resource strain: Not on file  . Food insecurity:    Worry: Not on file    Inability: Not on file  . Transportation needs:    Medical: Not on file    Non-medical: Not on file  Tobacco Use  . Smoking status: Former Research scientist (life sciences)  . Smokeless tobacco: Never Used  . Tobacco comment: quit 35 years  Substance and Sexual Activity  . Alcohol use: Yes    Alcohol/week: 0.0 standard drinks    Comment: occasional  . Drug use: No  . Sexual activity: Yes    Birth control/protection: Condom  Lifestyle  . Physical activity:    Days per week: Not on file    Minutes per session: Not on file  . Stress: Not on file  Relationships  . Social connections:    Talks on phone: Not on file    Gets together: Not on file    Attends religious service: Not on file    Active member of club or organization: Not on file    Attends meetings of clubs or organizations: Not on file    Relationship status: Not on file  Other Topics Concern  . Not on file  Social History  Narrative  . Not on file    Vitals:   10/03/18 1055  BP: 120/84  Pulse: (!) 106  Resp: 12  Temp: 98.5 F (36.9 C)  SpO2: 97%   Body mass index is 24.48 kg/m.   Physical Exam  Nursing note and vitals reviewed. Constitutional: He is oriented to person, place, and time. He appears well-developed and well-nourished. He does not appear ill. No distress.  HENT:  Head: Normocephalic and atraumatic.  Nose: Rhinorrhea present.  Mouth/Throat: Oropharynx is clear and moist and mucous membranes are normal.  Mild dysphonia.  Eyes: Conjunctivae and EOM are normal.  Cardiovascular: Regular rhythm.  Occasional extrasystoles (X 1) are present. Tachycardia present.  No murmur heard. Pulses:      Dorsalis pedis pulses are 2+ on the right side and 2+ on the left side.  Respiratory: Effort normal and breath sounds normal. No stridor. No respiratory distress.  Musculoskeletal:     Lumbar back: He exhibits no tenderness and no bony tenderness.  Lymphadenopathy:       Head (right side): No submandibular adenopathy present.       Head (left side): No submandibular adenopathy present.    He has no cervical adenopathy.  Neurological: He is alert and oriented to person, place, and time. He has normal strength. A sensory deficit is present. No cranial nerve deficit. Gait normal.  Reflex Scores:      Patellar reflexes are 2+ on the right side and 2+ on the left side. Decreased monofilament sensation foot bilateral.  Skin: Skin is warm. No rash noted. No erythema.  Psychiatric: He has a normal mood and affect. His speech is normal.  Well groomed, good eye contact.      ASSESSMENT AND PLAN:   Mr. [name] was seen today for left foot numbness, uri and laryngitis.  Diagnoses and all orders for this visit:  Numbness of left foot Possible  etiologies discussed. No focal weakness on examination, decreased sensation. B12 has been on lower normal range before, we will repeat test. ? Radicular.  After discussion of side effects he agrees with Prednisone taper.  May need other lab work done,follow up with PCP in 10 days.  -     MR Lumbar Spine Wo Contrast; Future -     predniSONE (DELTASONE) 20 MG tablet; 3 tabs for 3 days, 2 tabs for 3 days, 1 tabs for 3 days, and 1/2 tab for 3 days. Take tables together with breakfast. -     Ambulatory referral to Neurosurgery -     Vitamin B12  Lumbar back pain Mild and chronic. Because new onset of left foot numbness and getting worse lumbar imaging ordered. Instructed about warning signs. He would like referral to neurosurgeon  -     MR Lumbar Spine Wo Contrast; Future -     Ambulatory referral to Neurosurgery  Sinus tachycardia Extra beat x 1. Asymptomatic. I do not think further work up is needed today. Clearly instructed about warning signs. F/U with PCP in 10 days, before if needed.  URI, acute Symptoms suggests a viral etiology, I explained patient that symptomatic treatment is usually recommended in this case, so I do not think abx is needed at this time. Instructed to monitor for signs of complications, including new onset of fever among some, clearly instructed about warning signs. I also explained that cough and nasal congestion can last a few days and sometimes weeks. F/U as needed.   Laryngitis, acute Reassured. Voice rest.  Essential hypertension, benign BP adequately controlled. No changes in current management. Continue monitoring BP. Follow-up with PCP in 10 days.    Return in about 10 days (around 10/13/2018) for PCP.      Tabbitha Janvrin G. Martinique, MD  [facility]. [place] office.

## 2018-10-05 ENCOUNTER — Other Ambulatory Visit: Payer: PPO

## 2018-10-06 ENCOUNTER — Encounter: Payer: Self-pay | Admitting: Family Medicine

## 2018-10-08 ENCOUNTER — Encounter: Payer: Self-pay | Admitting: Family Medicine

## 2018-10-09 DIAGNOSIS — M47812 Spondylosis without myelopathy or radiculopathy, cervical region: Secondary | ICD-10-CM | POA: Diagnosis not present

## 2018-10-09 DIAGNOSIS — G894 Chronic pain syndrome: Secondary | ICD-10-CM | POA: Diagnosis not present

## 2018-10-10 ENCOUNTER — Telehealth: Payer: Self-pay | Admitting: Family Medicine

## 2018-10-10 NOTE — Telephone Encounter (Signed)
Copied from [place] [id number]. Topic: Quick Communication - See Telephone Encounter >> Oct 10, 2018 12:00 PM Bea Graff, NT wrote: CRM for notification. See Telephone encounter for: 10/10/18. Pt states that he needs the MRI order to be changed to With and without contrast for his MRI on Friday. Please advise.

## 2018-10-10 NOTE — Telephone Encounter (Signed)
Dr. Sarajane Jews please advise if we can change the MRI order to with and without contrast.  Thanks

## 2018-10-11 ENCOUNTER — Other Ambulatory Visit: Payer: Self-pay | Admitting: Family Medicine

## 2018-10-11 DIAGNOSIS — R200 Anesthesia of skin: Secondary | ICD-10-CM

## 2018-10-11 DIAGNOSIS — R208 Other disturbances of skin sensation: Secondary | ICD-10-CM

## 2018-10-11 NOTE — Telephone Encounter (Signed)
Since Dr. Martinique ordered the scan, I will ask her to change the order

## 2018-10-11 NOTE — Telephone Encounter (Signed)
Order for lumbar MRI with and without contrast was placed. I could not cancel order for lumbar MRI without contrast. Thanks, BJ

## 2018-10-13 ENCOUNTER — Ambulatory Visit: Payer: PPO | Admitting: Family Medicine

## 2018-10-13 ENCOUNTER — Other Ambulatory Visit: Payer: PPO

## 2018-10-13 ENCOUNTER — Ambulatory Visit
Admission: RE | Admit: 2018-10-13 | Discharge: 2018-10-13 | Disposition: A | Discharge status: Home / Self Care | Payer: PPO | Source: Ambulatory Visit | Attending: Family Medicine | Admitting: Family Medicine

## 2018-10-13 DIAGNOSIS — M5126 Other intervertebral disc displacement, lumbar region: Secondary | ICD-10-CM | POA: Diagnosis not present

## 2018-10-13 DIAGNOSIS — R208 Other disturbances of skin sensation: Secondary | ICD-10-CM

## 2018-10-13 DIAGNOSIS — R200 Anesthesia of skin: Secondary | ICD-10-CM

## 2018-10-13 MED ORDER — GADOBENATE DIMEGLUMINE 529 MG/ML IV SOLN
15.0000 mL | Freq: Once | INTRAVENOUS | Status: AC | PRN
  Administered 2018-10-13: 15 mL via INTRAVENOUS

## 2018-10-20 ENCOUNTER — Encounter: Payer: Self-pay | Admitting: Family Medicine

## 2018-10-23 ENCOUNTER — Ambulatory Visit: Payer: PPO | Admitting: Family Medicine

## 2018-10-27 ENCOUNTER — Encounter: Payer: Self-pay | Admitting: Family Medicine

## 2018-10-27 ENCOUNTER — Ambulatory Visit (INDEPENDENT_AMBULATORY_CARE_PROVIDER_SITE_OTHER): Payer: PPO | Admitting: Family Medicine

## 2018-10-27 VITALS — BP 132/82 | HR 92 | Temp 98.6°F | Wt 161.2 lb

## 2018-10-27 DIAGNOSIS — G629 Polyneuropathy, unspecified: Secondary | ICD-10-CM

## 2018-10-27 LAB — CBC WITH DIFFERENTIAL/PLATELET
BASOS PCT: 0.6 % (ref 0.0–3.0)
Basophils Absolute: 0 10*3/uL (ref 0.0–0.1)
EOS ABS: 0 10*3/uL (ref 0.0–0.7)
Eosinophils Relative: 0.7 % (ref 0.0–5.0)
HEMATOCRIT: 48.2 % (ref 39.0–52.0)
HEMOGLOBIN: 16.3 g/dL (ref 13.0–17.0)
Lymphocytes Relative: 16.9 % (ref 12.0–46.0)
Lymphs Abs: 1.2 10*3/uL (ref 0.7–4.0)
MCHC: 33.9 g/dL (ref 30.0–36.0)
MCV: 88.2 fl (ref 78.0–100.0)
MONO ABS: 0.5 10*3/uL (ref 0.1–1.0)
Monocytes Relative: 6.2 % (ref 3.0–12.0)
Neutro Abs: 5.5 10*3/uL (ref 1.4–7.7)
Neutrophils Relative %: 75.6 % (ref 43.0–77.0)
PLATELETS: 230 10*3/uL (ref 150.0–400.0)
RBC: 5.46 Mil/uL (ref 4.22–5.81)
RDW: 13.1 % (ref 11.5–15.5)
WBC: 7.3 10*3/uL (ref 4.0–10.5)

## 2018-10-27 LAB — HEPATIC FUNCTION PANEL
ALT: 31 U/L (ref 0–53)
AST: 23 U/L (ref 0–37)
Albumin: 4.5 g/dL (ref 3.5–5.2)
Alkaline Phosphatase: 65 U/L (ref 39–117)
BILIRUBIN DIRECT: 0.1 mg/dL (ref 0.0–0.3)
BILIRUBIN TOTAL: 0.5 mg/dL (ref 0.2–1.2)
Total Protein: 7 g/dL (ref 6.0–8.3)

## 2018-10-27 LAB — BASIC METABOLIC PANEL
BUN: 15 mg/dL (ref 6–23)
CHLORIDE: 100 meq/L (ref 96–112)
CO2: 31 meq/L (ref 19–32)
Calcium: 9.7 mg/dL (ref 8.4–10.5)
Creatinine, Ser: 0.97 mg/dL (ref 0.40–1.50)
GFR: 79.93 mL/min (ref >60.00)
GLUCOSE: 115 mg/dL — AB (ref 70–99)
POTASSIUM: 4.1 meq/L (ref 3.5–5.1)
SODIUM: 138 meq/L (ref 135–145)

## 2018-10-27 LAB — TSH: TSH: 0.7 u[IU]/mL (ref 0.35–4.50)

## 2018-10-27 LAB — T3, FREE: T3, Free: 3.9 pg/mL (ref 2.3–4.2)

## 2018-10-27 LAB — T4, FREE: Free T4: 0.85 ng/dL (ref 0.60–1.60)

## 2018-10-27 LAB — C-REACTIVE PROTEIN: CRP: 1 mg/dL (ref 0.5–20.0)

## 2018-10-27 LAB — SEDIMENTATION RATE: SED RATE: 21 mm/h — AB (ref 0–20)

## 2018-10-27 NOTE — Progress Notes (Signed)
   Subjective:    Patient ID: [name], male    DOB: 1961/05/01, 58 y.o.   MRN: [id number]  HPI Here to follow up on left foot numbness. This started about 4 weeks ago and it involves the entire foot. He saw Dr. Martinique for this and she ordered a B12 level (normal) and a lumbar spine MRI. The MRI showed postsurgical changes and a lot of facet arthropathy, but nothing to explain the numbness.    Review of Systems  Constitutional: Negative.   Respiratory: Negative.   Cardiovascular: Negative.   Musculoskeletal: Positive for back pain.  Neurological: Positive for numbness.       Objective:   Physical Exam Constitutional:      Appearance: Normal appearance.  Cardiovascular:     Rate and Rhythm: Normal rate and regular rhythm.     Pulses: Normal pulses.     Heart sounds: Normal heart sounds.  Pulmonary:     Effort: Pulmonary effort is normal.     Breath sounds: Normal breath sounds.  Neurological:     General: No focal deficit present.     Mental Status: He is alert and oriented to person, place, and time.     Motor: No weakness.     Coordination: Coordination normal.           Assessment & Plan:  Foot numbness. This appears to be from a neuropathy and not from a spinal issue. We will screen with labs today. He will start taking a B complex vitamin daily. We may consider a referral to Neurology at some point.  Alysia Penna, MD

## 2018-11-07 NOTE — Addendum Note (Signed)
Addended by: Alysia Penna A on: 11/07/2018 03:28 PM   Modules accepted: Orders

## 2018-11-09 DIAGNOSIS — F322 Major depressive disorder, single episode, severe without psychotic features: Secondary | ICD-10-CM | POA: Diagnosis not present

## 2018-11-27 DIAGNOSIS — Z79891 Long term (current) use of opiate analgesic: Secondary | ICD-10-CM | POA: Diagnosis not present

## 2018-11-27 DIAGNOSIS — M5416 Radiculopathy, lumbar region: Secondary | ICD-10-CM | POA: Diagnosis not present

## 2018-11-27 DIAGNOSIS — G5752 Tarsal tunnel syndrome, left lower limb: Secondary | ICD-10-CM | POA: Diagnosis not present

## 2018-11-27 DIAGNOSIS — G894 Chronic pain syndrome: Secondary | ICD-10-CM | POA: Diagnosis not present

## 2018-12-19 ENCOUNTER — Encounter: Payer: PPO | Admitting: Family Medicine

## 2018-12-25 ENCOUNTER — Telehealth: Payer: Self-pay | Admitting: *Deleted

## 2018-12-25 ENCOUNTER — Encounter: Payer: Self-pay | Admitting: *Deleted

## 2018-12-25 NOTE — Telephone Encounter (Signed)
Called patient to review history, meds etc. He stated he was on his way to bank, asked to be called at 3:30 pm today. I advised will call then.

## 2018-12-25 NOTE — Telephone Encounter (Signed)
Received call back from patient. Reviewed history, meds, allergies etc with patient. He had no questions,  verbalized understanding, appreciation.

## 2018-12-25 NOTE — Addendum Note (Signed)
Addended by: Minna Antis on: 12/25/2018 04:15 PM   Modules accepted: Orders

## 2018-12-25 NOTE — Telephone Encounter (Signed)
LVM requesting patient call back to update his MR. Advised if he cannot call back today he may call tomorrow prior to his 1 pm appt. Left number.

## 2018-12-26 ENCOUNTER — Other Ambulatory Visit: Payer: Self-pay

## 2018-12-26 ENCOUNTER — Encounter

## 2018-12-26 ENCOUNTER — Ambulatory Visit (INDEPENDENT_AMBULATORY_CARE_PROVIDER_SITE_OTHER): Payer: PPO | Admitting: Diagnostic Neuroimaging

## 2018-12-26 ENCOUNTER — Encounter: Payer: Self-pay | Admitting: Diagnostic Neuroimaging

## 2018-12-26 DIAGNOSIS — R200 Anesthesia of skin: Secondary | ICD-10-CM

## 2018-12-26 DIAGNOSIS — R208 Other disturbances of skin sensation: Secondary | ICD-10-CM
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
## 2019-01-09 DIAGNOSIS — G609 Hereditary and idiopathic neuropathy, unspecified: Secondary | ICD-10-CM | POA: Diagnosis not present

## 2019-01-09 DIAGNOSIS — I10 Essential (primary) hypertension: Secondary | ICD-10-CM | POA: Diagnosis not present

## 2019-01-22 DIAGNOSIS — M5416 Radiculopathy, lumbar region: Secondary | ICD-10-CM | POA: Diagnosis not present

## 2019-01-22 DIAGNOSIS — Z79891 Long term (current) use of opiate analgesic: Secondary | ICD-10-CM | POA: Diagnosis not present

## 2019-01-22 DIAGNOSIS — G5752 Tarsal tunnel syndrome, left lower limb: Secondary | ICD-10-CM | POA: Diagnosis not present

## 2019-01-22 DIAGNOSIS — G894 Chronic pain syndrome: Secondary | ICD-10-CM | POA: Diagnosis not present

## 2019-03-27 DIAGNOSIS — R200 Anesthesia of skin: Secondary | ICD-10-CM | POA: Diagnosis not present

## 2019-03-27 DIAGNOSIS — G5752 Tarsal tunnel syndrome, left lower limb: Secondary | ICD-10-CM | POA: Diagnosis not present

## 2019-03-27 DIAGNOSIS — R202 Paresthesia of skin: Secondary | ICD-10-CM | POA: Diagnosis not present

## 2019-03-28 DIAGNOSIS — R200 Anesthesia of skin: Secondary | ICD-10-CM | POA: Diagnosis not present

## 2019-03-28 DIAGNOSIS — G894 Chronic pain syndrome: Secondary | ICD-10-CM | POA: Diagnosis not present

## 2019-03-28 DIAGNOSIS — G5752 Tarsal tunnel syndrome, left lower limb: Secondary | ICD-10-CM | POA: Diagnosis not present

## 2019-03-28 DIAGNOSIS — M5416 Radiculopathy, lumbar region: Secondary | ICD-10-CM | POA: Diagnosis not present

## 2019-03-28 DIAGNOSIS — R202 Paresthesia of skin: Secondary | ICD-10-CM | POA: Diagnosis not present

## 2019-03-28 DIAGNOSIS — Z79891 Long term (current) use of opiate analgesic: Secondary | ICD-10-CM | POA: Diagnosis not present

## 2019-04-03 ENCOUNTER — Ambulatory Visit (INDEPENDENT_AMBULATORY_CARE_PROVIDER_SITE_OTHER): Payer: PPO | Admitting: Family Medicine

## 2019-04-03 ENCOUNTER — Encounter: Payer: Self-pay | Admitting: Family Medicine

## 2019-04-03 ENCOUNTER — Other Ambulatory Visit: Payer: Self-pay

## 2019-04-03 VITALS — BP 120/82 | HR 86 | Temp 98.3°F | Ht 68.5 in | Wt 165.1 lb

## 2019-04-03 DIAGNOSIS — Z0000 Encounter for general adult medical examination without abnormal findings: Secondary | ICD-10-CM | POA: Diagnosis not present

## 2019-04-03 LAB — BASIC METABOLIC PANEL
BUN: 13 mg/dL (ref 6–23)
CO2: 29 mEq/L (ref 19–32)
Calcium: 9.3 mg/dL (ref 8.4–10.5)
Chloride: 98 mEq/L (ref 96–112)
Creatinine, Ser: 1.03 mg/dL (ref 0.40–1.50)
GFR: 74.47 mL/min (ref >60.00)
Glucose, Bld: 87 mg/dL (ref 70–99)
Potassium: 4.6 mEq/L (ref 3.5–5.1)
Sodium: 137 mEq/L (ref 135–145)

## 2019-04-03 LAB — POC URINALSYSI DIPSTICK (AUTOMATED)
Bilirubin, UA: NEGATIVE
Blood, UA: NEGATIVE
Glucose, UA: NEGATIVE
Leukocytes, UA: NEGATIVE
Nitrite, UA: NEGATIVE
Protein, UA: POSITIVE — AB
Spec Grav, UA: >=1.03 — AB (ref 1.010–1.025)
Urobilinogen, UA: 0.2 E.U./dL
pH, UA: 5.5 (ref 5.0–8.0)

## 2019-04-03 LAB — LIPID PANEL
Cholesterol: 228 mg/dL — ABNORMAL HIGH (ref 0–200)
HDL: 54 mg/dL (ref >39.00)
LDL Cholesterol: 137 mg/dL — ABNORMAL HIGH (ref 0–99)
NonHDL: 173.54
Total CHOL/HDL Ratio: 4
Triglycerides: 184 mg/dL — ABNORMAL HIGH (ref 0.0–149.0)
VLDL: 36.8 mg/dL (ref 0.0–40.0)

## 2019-04-03 LAB — CBC WITH DIFFERENTIAL/PLATELET
Basophils Absolute: 0.1 10*3/uL (ref 0.0–0.1)
Basophils Relative: 0.7 % (ref 0.0–3.0)
Eosinophils Absolute: 0.2 10*3/uL (ref 0.0–0.7)
Eosinophils Relative: 1.9 % (ref 0.0–5.0)
HCT: 48.1 % (ref 39.0–52.0)
Hemoglobin: 16.2 g/dL (ref 13.0–17.0)
Lymphocytes Relative: 22.5 % (ref 12.0–46.0)
Lymphs Abs: 2 10*3/uL (ref 0.7–4.0)
MCHC: 33.6 g/dL (ref 30.0–36.0)
MCV: 91.2 fl (ref 78.0–100.0)
Monocytes Absolute: 0.7 10*3/uL (ref 0.1–1.0)
Monocytes Relative: 7.9 % (ref 3.0–12.0)
Neutro Abs: 5.9 10*3/uL (ref 1.4–7.7)
Neutrophils Relative %: 67 % (ref 43.0–77.0)
Platelets: 211 10*3/uL (ref 150.0–400.0)
RBC: 5.27 Mil/uL (ref 4.22–5.81)
RDW: 13.9 % (ref 11.5–15.5)
WBC: 8.8 10*3/uL (ref 4.0–10.5)

## 2019-04-03 LAB — HEPATIC FUNCTION PANEL
ALT: 28 U/L (ref 0–53)
AST: 21 U/L (ref 0–37)
Albumin: 4.6 g/dL (ref 3.5–5.2)
Alkaline Phosphatase: 67 U/L (ref 39–117)
Bilirubin, Direct: 0.1 mg/dL (ref 0.0–0.3)
Total Bilirubin: 0.6 mg/dL (ref 0.2–1.2)
Total Protein: 7.1 g/dL (ref 6.0–8.3)

## 2019-04-03 LAB — TSH: TSH: 2.67 u[IU]/mL (ref 0.35–4.50)

## 2019-04-03 LAB — PSA: PSA: 1.72 ng/mL (ref 0.10–4.00)

## 2019-04-03 MED ORDER — EPINEPHRINE 0.3 MG/0.3ML IJ SOAJ: 0.3000 mg | INTRAMUSCULAR | 5 refills | Status: DC | PRN

## 2019-04-03 MED ORDER — AMLODIPINE BESYLATE 10 MG PO TABS: 10.0000 mg | ORAL_TABLET | Freq: Every day | ORAL | 3 refills | Status: DC

## 2019-04-03 MED ORDER — FLUTICASONE PROPIONATE 50 MCG/ACT NA SUSP: 2.0000 | Freq: Every day | NASAL | 11 refills | Status: DC

## 2019-04-03 MED ORDER — VALACYCLOVIR HCL 500 MG PO TABS: 500.0000 mg | ORAL_TABLET | Freq: Two times a day (BID) | ORAL | 3 refills | Status: DC

## 2019-04-03 NOTE — Progress Notes (Signed)
Subjective:    Patient ID: [name], male    DOB: December 09, 1960, 58 y.o.   MRN: [id number]  HPI Here for a well exam. He feels well in general. We had seen him for pain and numbness in the left foot and he then saw Dr. Andrey Spearman of Neurology. He then saw Dr. Sheran Lawless Dial of [facility], and he diagnosed [name] with tarsal tunnel syndrome. He gave him a cortisone shot and they are deliberating about whether to try a surgery or not. He still sees Dr. Hardin Negus for pain management. He sees Dr. Celesta Aver for psychiatric care.    Review of Systems  Constitutional: Negative.   HENT: Negative.   Eyes: Negative.   Respiratory: Negative.   Cardiovascular: Negative.   Gastrointestinal: Negative.   Genitourinary: Negative.   Musculoskeletal: Positive for arthralgias and back pain.  Skin: Negative.   Neurological: Positive for numbness.  Psychiatric/Behavioral: Negative.        Objective:   Physical Exam Constitutional:      General: He is not in acute distress.    Appearance: He is well-developed. He is not diaphoretic.  HENT:     Head: Normocephalic and atraumatic.     Right Ear: External ear normal.     Left Ear: External ear normal.     Nose: Nose normal.     Mouth/Throat:     Pharynx: No oropharyngeal exudate.  Eyes:     General: No scleral icterus.       Right eye: No discharge.        Left eye: No discharge.     Conjunctiva/sclera: Conjunctivae normal.     Pupils: Pupils are equal, round, and reactive to light.  Neck:     Musculoskeletal: Neck supple.     Thyroid: No thyromegaly.     Vascular: No JVD.     Trachea: No tracheal deviation.  Cardiovascular:     Rate and Rhythm: Normal rate and regular rhythm.     Heart sounds: Normal heart sounds. No murmur. No friction rub. No gallop.   Pulmonary:     Effort: Pulmonary effort is normal. No respiratory distress.     Breath sounds: Normal breath sounds. No wheezing or rales.  Chest:     Chest wall:  No tenderness.  Abdominal:     General: Bowel sounds are normal. There is no distension.     Palpations: Abdomen is soft. There is no mass.     Tenderness: There is no abdominal tenderness. There is no guarding or rebound.  Genitourinary:    Penis: Normal. No tenderness.      Prostate: Normal.     Rectum: Normal. Guaiac result negative.  Musculoskeletal: Normal range of motion.        General: No tenderness.  Lymphadenopathy:     Cervical: No cervical adenopathy.  Skin:    General: Skin is warm and dry.     Coloration: Skin is not pale.     Findings: No erythema or rash.  Neurological:     Mental Status: He is alert and oriented to person, place, and time.     Cranial Nerves: No cranial nerve deficit.     Motor: No abnormal muscle tone.     Coordination: Coordination normal.     Deep Tendon Reflexes: Reflexes are normal and symmetric. Reflexes normal.  Psychiatric:        Behavior: Behavior normal.        Thought Content: Thought content normal.  Judgment: Judgment normal.           Assessment & Plan:  Well exam. We discussed diet and exercise. Get fasting labs.  Alysia Penna, MD

## 2019-04-06 ENCOUNTER — Encounter: Payer: Self-pay | Admitting: *Deleted

## 2019-05-03 DIAGNOSIS — F322 Major depressive disorder, single episode, severe without psychotic features: Secondary | ICD-10-CM | POA: Diagnosis not present

## 2019-05-23 DIAGNOSIS — M5416 Radiculopathy, lumbar region: Secondary | ICD-10-CM | POA: Diagnosis not present

## 2019-05-23 DIAGNOSIS — G5752 Tarsal tunnel syndrome, left lower limb: Secondary | ICD-10-CM | POA: Diagnosis not present

## 2019-05-23 DIAGNOSIS — Z79891 Long term (current) use of opiate analgesic: Secondary | ICD-10-CM | POA: Diagnosis not present

## 2019-05-23 DIAGNOSIS — G894 Chronic pain syndrome: Secondary | ICD-10-CM | POA: Diagnosis not present

## 2019-05-24 ENCOUNTER — Telehealth: Payer: PPO | Admitting: Family Medicine

## 2019-06-11 DIAGNOSIS — G5752 Tarsal tunnel syndrome, left lower limb: Secondary | ICD-10-CM | POA: Diagnosis not present

## 2019-06-14 ENCOUNTER — Other Ambulatory Visit: Payer: Self-pay

## 2019-06-14 DIAGNOSIS — R6889 Other general symptoms and signs: Secondary | ICD-10-CM | POA: Diagnosis not present

## 2019-06-14 DIAGNOSIS — Z20822 Contact with and (suspected) exposure to covid-19: Secondary | ICD-10-CM

## 2019-06-15 LAB — NOVEL CORONAVIRUS, NAA: SARS-CoV-2, NAA: NOT DETECTED

## 2019-07-11 DIAGNOSIS — G5752 Tarsal tunnel syndrome, left lower limb: Secondary | ICD-10-CM | POA: Diagnosis not present

## 2019-07-11 DIAGNOSIS — G894 Chronic pain syndrome: Secondary | ICD-10-CM | POA: Diagnosis not present

## 2019-07-11 DIAGNOSIS — M5416 Radiculopathy, lumbar region: Secondary | ICD-10-CM | POA: Diagnosis not present

## 2019-07-11 DIAGNOSIS — Z79891 Long term (current) use of opiate analgesic: Secondary | ICD-10-CM | POA: Diagnosis not present

## 2019-07-26 DIAGNOSIS — F322 Major depressive disorder, single episode, severe without psychotic features: Secondary | ICD-10-CM | POA: Diagnosis not present

## 2019-09-05 DIAGNOSIS — G894 Chronic pain syndrome: Secondary | ICD-10-CM | POA: Diagnosis not present

## 2019-09-05 DIAGNOSIS — G5752 Tarsal tunnel syndrome, left lower limb: Secondary | ICD-10-CM | POA: Diagnosis not present

## 2019-09-05 DIAGNOSIS — Z79891 Long term (current) use of opiate analgesic: Secondary | ICD-10-CM | POA: Diagnosis not present

## 2019-09-05 DIAGNOSIS — M5416 Radiculopathy, lumbar region: Secondary | ICD-10-CM | POA: Diagnosis not present

## 2019-10-03 DIAGNOSIS — G5752 Tarsal tunnel syndrome, left lower limb: Secondary | ICD-10-CM | POA: Diagnosis not present

## 2019-10-03 DIAGNOSIS — M5416 Radiculopathy, lumbar region: Secondary | ICD-10-CM | POA: Diagnosis not present

## 2019-10-03 DIAGNOSIS — Z79891 Long term (current) use of opiate analgesic: Secondary | ICD-10-CM | POA: Diagnosis not present

## 2019-10-03 DIAGNOSIS — G894 Chronic pain syndrome: Secondary | ICD-10-CM | POA: Diagnosis not present

## 2019-10-15 ENCOUNTER — Encounter: Payer: Self-pay | Admitting: Family Medicine

## 2019-10-15 NOTE — Telephone Encounter (Signed)
Last filled in 2018 Last OV 10/27/2018  Ok to fill?

## 2019-10-16 MED ORDER — HYDROCORT-PRAMOXINE (PERIANAL) 1-1 % EX FOAM: CUTANEOUS | 5 refills | Status: DC

## 2019-10-16 NOTE — Telephone Encounter (Signed)
Call in 10 gm with 5 rf

## 2019-10-18 ENCOUNTER — Telehealth: Payer: Self-pay | Admitting: Family Medicine

## 2019-10-18 DIAGNOSIS — F322 Major depressive disorder, single episode, severe without psychotic features: Secondary | ICD-10-CM | POA: Diagnosis not present

## 2019-10-18 NOTE — Telephone Encounter (Signed)
Pt insurance,Rosario from [employer], stated she needs prior authorization. Not sure what she needs it for but she said she will fax over the information. Provided  Claudie Leach with our fax number and she provided the fax number [phone] for [place] to fax the authorization back.

## 2019-10-19 NOTE — Telephone Encounter (Signed)
Noted. Will keep an eye out for the forms.

## 2019-10-25 ENCOUNTER — Telehealth: Payer: Self-pay | Admitting: Family Medicine

## 2019-10-25 ENCOUNTER — Telehealth: Payer: Self-pay | Admitting: *Deleted

## 2019-10-25 NOTE — Telephone Encounter (Signed)
Noted. Will keep an eye out for new form.

## 2019-10-25 NOTE — Telephone Encounter (Signed)
[name] is calling from [employer] and needing a prior authorization for a medication. [phone] is the call back number. Reference number is [id number]. Needing clinical information.

## 2019-10-25 NOTE — Telephone Encounter (Signed)
Pt's spouse called in to have the doctor to stop filling out the form for his medication Proctofoam. They will be faxing over a new form and it will have to be completed and faxed back within 72 hours. The prior form has already past the 72 hr period. Fax: [phone]

## 2019-10-25 NOTE — Telephone Encounter (Signed)
See other phone note. Form is being sent over.

## 2019-10-31 ENCOUNTER — Telehealth: Payer: Self-pay | Admitting: Family Medicine

## 2019-10-31 NOTE — Telephone Encounter (Signed)
Attempted to call. Number listed in the patients chart is not a working number.

## 2019-10-31 NOTE — Telephone Encounter (Signed)
New form has been received and faxed back.

## 2019-10-31 NOTE — Telephone Encounter (Signed)
Pt called with phone number to provide to be able to get his medication(hydrocortisone-pramoxine [facility]) He would like Dr. Sarajane Jews to call [employer] at [phone] calling them should fix the problem with receiving his medication.

## 2019-10-31 NOTE — Telephone Encounter (Signed)
Spoke with [employer] all Forms were received and faxed with all information.

## 2019-11-09 DIAGNOSIS — G894 Chronic pain syndrome: Secondary | ICD-10-CM | POA: Diagnosis not present

## 2019-11-09 DIAGNOSIS — M542 Cervicalgia: Secondary | ICD-10-CM | POA: Diagnosis not present

## 2019-11-09 DIAGNOSIS — M47812 Spondylosis without myelopathy or radiculopathy, cervical region: Secondary | ICD-10-CM | POA: Diagnosis not present

## 2019-11-09 MED ORDER — PREDNISONE 20 MG PO TABS: 20.0000 mg | ORAL_TABLET | Freq: Two times a day (BID) | ORAL | 0 refills | Status: DC

## 2019-11-09 NOTE — Telephone Encounter (Signed)
Done

## 2019-11-14 DIAGNOSIS — Z79891 Long term (current) use of opiate analgesic: Secondary | ICD-10-CM | POA: Diagnosis not present

## 2019-11-14 DIAGNOSIS — M5416 Radiculopathy, lumbar region: Secondary | ICD-10-CM | POA: Diagnosis not present

## 2019-11-14 DIAGNOSIS — G5752 Tarsal tunnel syndrome, left lower limb: Secondary | ICD-10-CM | POA: Diagnosis not present

## 2019-11-14 DIAGNOSIS — G894 Chronic pain syndrome: Secondary | ICD-10-CM | POA: Diagnosis not present

## 2019-11-26 ENCOUNTER — Ambulatory Visit (INDEPENDENT_AMBULATORY_CARE_PROVIDER_SITE_OTHER)
Admission: RE | Admit: 2019-11-26 | Discharge: 2019-11-26 | Disposition: A | Discharge status: Home / Self Care | Payer: PPO | Source: Ambulatory Visit

## 2019-11-26 DIAGNOSIS — G8929 Other chronic pain: Secondary | ICD-10-CM

## 2019-11-26 DIAGNOSIS — M542 Cervicalgia: Secondary | ICD-10-CM | POA: Diagnosis not present

## 2019-11-26 NOTE — Discharge Instructions (Signed)
Take 600 ibuprofen every 8 hours along with 1000 of Tylenol.  Heat to the neck. Wear the neck brace if you feel that this helps your symptoms. Recommend follow-up with primary care for possible further evaluation and MRI

## 2019-11-26 NOTE — ED Provider Notes (Addendum)
Virtual Visit via Video Note:  [name]  initiated request for Telemedicine visit with [facility] team. I connected with [name]  on 11/26/2019 at 4:53 PM  for a synchronized telemedicine visit using a video enabled HIPPA compliant telemedicine application. I verified that I am speaking with [name]  using two identifiers. Ronald July, NP  was physically located in a [facility] Urgent care site and [name] was located at a different location.   The limitations of evaluation and management by telemedicine as well as the availability of in-person appointments were discussed. Patient was informed that he  may incur a bill ( including co-pay) for this virtual visit encounter. [name]  expressed understanding and gave verbal consent to proceed with virtual visit.     History of Present Illness:[name]  is a 59 y.o. male presents with chronic neck pain.  This has been ongoing issue for him.  Patient wanting some advice on anything extra he can try.  Looking at patient's chart patient is already on fentanyl patch and taking muscle relaxant. He has also been on 2 prednisone tapers which hasn't helped.  Plans to see specialist.  Reports the pain in his neck is worsening and radiating into jaw area.  Denies any dental issues.  Denies any fever, chills, body aches, trismus  Past Medical History:  Diagnosis Date  . ADHD (attention deficit hyperactivity disorder), inattentive type    sees Dr. Celesta Aver in [place], [place]   . Anxiety    sees Dr. Celesta Aver in [place], [place]   . Arthritis   . Benign enlargement of prostate    sees [facility]   . Carpal tunnel syndrome on left    se[facility] Dr. Eugene Garnet at [facility]   . Chronic pain    sees Dr. Nicholaus Bloom (meds) and [facility] (accupuncture)   . Depression    sees Dr. Celesta Aver  . Functional impotence   . Hypogonadism in male    sees Larene Beach (a [place]) at  [facility]   . Insomnia   . Kidney stones   . Sinusitis, acute 07/17/2016  . Urticaria due to cold     Allergies  Allergen Reactions  . Fire Dynegy Anaphylaxis    Has Epi pen Has Epi pen   . Codeine     REACTION: ITCHING  . Cyclobenzaprine Other (See Comments)    Nightmares  . Docusate Sodium     swelling  . Dulcolax Stool Softener [Dss]     swelling  . Gabapentin     migraine  . Ketorolac     Other reaction(s): Vomiting (intolerance)  . Ketorolac Tromethamine Other (See Comments)    REACTION: SEVERE VOMMITING  . Lisinopril     Cough & chest tightness  . Methadone Other (See Comments)    Loss of appetite  . Pregabalin     headaches Other reaction(s): Confusion (intolerance)        Observations/Objective:VITALS: Per patient if applicable, see vitals. GENERAL: Alert, appears well and in no acute distress. HEENT: Atraumatic, conjunctiva clear, no obvious abnormalities on inspection of external nose and ears. NECK: Normal movements of the head and neck. CARDIOPULMONARY: No increased WOB. Speaking in clear sentences. I:E ratio WNL.  MS: Moves all visible extremities without noticeable abnormality. PSYCH: Pleasant and cooperative, well-groomed. Speech normal rate and rhythm. Affect is appropriate. Insight and judgement are appropriate. Attention is focused, linear, and appropriate.  NEURO:  CN grossly intact. Oriented as arrived to appointment on time with no prompting. Moves both UE equally.  SKIN: No obvious lesions, wounds, erythema, or cyanosis noted on face or hands.     Assessment and Plan: Recommended alternating heat and ice.  Tylenol and ibuprofen every 8 hours. Follow-up with specialist Pain medication and muscle x-ray as prescribed by previous physician   Follow Up Instructions: Follow up as needed for continued or worsening symptoms     I discussed the assessment and treatment plan with the patient. The patient was provided an opportunity to ask  questions and all were answered. The patient agreed with the plan and demonstrated an understanding of the instructions.   The patient was advised to call back or seek an in-person evaluation if the symptoms worsen or if the condition fails to improve as anticipated.   Ronald July, NP  11/26/2019 4:53 PM         Ronald July, NP 11/28/19 0825    Ronald July, NP 11/28/19 [phone]

## 2019-11-27 ENCOUNTER — Other Ambulatory Visit: Payer: Self-pay

## 2019-11-27 ENCOUNTER — Telehealth (INDEPENDENT_AMBULATORY_CARE_PROVIDER_SITE_OTHER): Payer: PPO | Admitting: Family Medicine

## 2019-11-27 DIAGNOSIS — M542 Cervicalgia: Secondary | ICD-10-CM

## 2019-11-27 DIAGNOSIS — R6884 Jaw pain: Secondary | ICD-10-CM

## 2019-11-27 NOTE — Progress Notes (Signed)
Virtual Visit via Video Note  I connected with the patient on 11/27/19 at 11:30 AM EST by a video enabled telemedicine application and verified that I am speaking with the correct person using two identifiers.  Location patient: home Location provider:work or home office Persons participating in the virtual visit: patient, provider  I discussed the limitations of evaluation and management by telemedicine and the availability of in person appointments. The patient expressed understanding and agreed to proceed.   HPI: Here for severe pain the right neck and right jaw. He developed sharp pains in the right posterior neck at the base of the skull about 3 weeks ago. No recent trauma. This pain would sometimes radiate up over the top of the head and lead to a headache. [facility] Pat[facility] and they called in a Prednisone taper which he took over a 2 week period. This did not help at all. Then about 4 days ago he also developed a severe pain in the right jaw area that radiates up the the molars on the right side of his mouth, both upper and lower. This makes it very painful for him to chew anything.  Yesterday he had a virtual visit with [facility], and they told him to take his muscle relaxers, Tylenol, and Ibuprofen, but this has not helped at all. His face has no numbness or tingling. No trouble swallowing.    ROS: See pertinent positives and negatives per HPI.  Past Medical History:  Diagnosis Date  . ADHD (attention deficit hyperactivity disorder), inattentive type    sees Dr. Celesta Aver in [place], [place]   . Anxiety    sees Dr. Celesta Aver in [place], [place]   . Arthritis   . Benign enlargement of prostate    sees [facility]   . Carpal tunnel syndrome on left    sees Dr. Eugene Garnet at [facility]   . Chronic pain    sees Dr. Nicholaus Bloom (meds) and [facility] (accupuncture)   . Depression    sees Dr. Celesta Aver  . Functional impotence    . Hypogonadism in male    sees Larene Beach (a [place]) at [facility]   . Insomnia   . Kidney stones   . Sinusitis, acute 07/17/2016  . Urticaria due to cold     Past Surgical History:  Procedure Laterality Date  . APPENDECTOMY    . CERVICAL FUSION     C6-7  . COLONOSCOPY  11/06/2009   per Dr. Teena Irani, no polyps, repeat in 10 yrs   . FOOT SURGERY Right    x 2  . LUMBAR DISC SURGERY     L5-S1, C 6-6    Family History  Problem Relation Age of Onset  . Heart disease Other   . Diabetes Other   . Hyperlipidemia Other   . Hypertension Sister   . Breast cancer Sister   . Heart disease Father   . Diabetes Father   . Heart attack Father        x 2  . Prostate cancer Neg Hx   . Kidney disease Neg Hx      Current Outpatient Medications:  .  amLODipine (NORVASC) 10 MG tablet, Take 1 tablet (10 mg total) by mouth daily., Disp: 90 tablet, Rfl: 3 .  cetirizine (ZYRTEC) 10 MG tablet, Take 10 mg by mouth daily.  , Disp: , Rfl:  .  diazepam (VALIUM) 10 MG tablet, Take 10 mg  by mouth every 6 (six) hours as needed.  , Disp: , Rfl:  .  diclofenac sodium (VOLTAREN) 1 % GEL, Apply 1 application topically as needed.  , Disp: , Rfl:  .  EPINEPHrine 0.3 mg/0.3 mL IJ SOAJ injection, Inject 0.3 mLs (0.3 mg total) into the muscle as needed for anaphylaxis., Disp: 1 each, Rfl: 5 .  fentaNYL (DURAGESIC - DOSED MCG/HR) 50 MCG/HR, Place 50 mcg onto the skin every other day., Disp: , Rfl:  .  fluticasone (FLONASE) 50 MCG/ACT nasal spray, Place 2 sprays into both nostrils daily., Disp: 16 g, Rfl: 11 .  hydrocortisone-pramoxine (PROCTOFOAM HC) rectal foam, USE AS DIRECTED 3 TIMES A DAY AS NEEDED, Disp: 10 g, Rfl: 5 .  hydrocortisone-pramoxine (PROCTOFOAM-HC) rectal foam, Place 1 applicator rectally 2 (two) times daily., Disp: , Rfl:  .  ketoconazole (NIZORAL) 200 MG tablet, Take 1 tablet (200 mg total) by mouth daily., Disp: 90 tablet, Rfl: 0 .  lactulose (CHRONULAC) 10 GM/15ML solution, , Disp: ,  Rfl:  .  oxyCODONE (OXY IR/ROXICODONE) 5 MG immediate release tablet, TAKE 1 TABLET BY MOUTH EVERY SIX HOURS AS NEEDED FOR PAIN, Disp: , Rfl: 0 .  predniSONE (DELTASONE) 20 MG tablet, Take 1 tablet (20 mg total) by mouth 2 (two) times daily with a meal., Disp: 60 tablet, Rfl: 0 .  promethazine (PHENERGAN) 25 MG tablet, Reported on 10/30/2015, Disp: , Rfl:  .  traZODone (DESYREL) 100 MG tablet, Take 50 mg by mouth at bedtime. , Disp: , Rfl:  .  triamcinolone cream (KENALOG) 0.1 %, Apply 1 application topically 2 (two) times daily., Disp: 30 g, Rfl: 0 .  valACYclovir (VALTREX) 500 MG tablet, Take 1 tablet (500 mg total) by mouth 2 (two) times daily., Disp: 180 tablet, Rfl: 3  EXAM:  VITALS per patient if applicable:  GENERAL: alert, oriented, appears well and in no acute distress  HEENT: atraumatic, conjunttiva clear, no obvious abnormalities on inspection of external nose and ears  NECK: normal movements of the head and neck  LUNGS: on inspection no signs of respiratory distress, breathing rate appears normal, no obvious gross SOB, gasping or wheezing  CV: no obvious cyanosis  MS: moves all visible extremities without noticeable abnormality  PSYCH/NEURO: pleasant and cooperative, no obvious depression or anxiety, speech and thought processing grossly intact  ASSESSMENT AND PLAN: Right posterior neck pain, headache, and right jaw pain. It is not clear what the etiology of these pains could be. We agreed for him to come into the office tomorrow for an in person examination.  Alysia Penna, MD  Discussed the following assessment and plan:  No diagnosis found.     I discussed the assessment and treatment plan with the patient. The patient was provided an opportunity to ask questions and all were answered. The patient agreed with the plan and demonstrated an understanding of the instructions.   The patient was advised to call back or seek an in-person evaluation if the symptoms worsen or  if the condition fails to improve as anticipated.

## 2019-11-28 ENCOUNTER — Other Ambulatory Visit: Payer: Self-pay

## 2019-11-28 ENCOUNTER — Encounter: Payer: Self-pay | Admitting: Family Medicine

## 2019-11-28 ENCOUNTER — Ambulatory Visit (INDEPENDENT_AMBULATORY_CARE_PROVIDER_SITE_OTHER): Payer: PPO | Admitting: Family Medicine

## 2019-11-28 VITALS — BP 140/80 | HR 101 | Temp 98.0°F | Wt 162.6 lb

## 2019-11-28 DIAGNOSIS — R52 Pain, unspecified: Secondary | ICD-10-CM | POA: Diagnosis not present

## 2019-11-28 DIAGNOSIS — R519 Headache, unspecified: Secondary | ICD-10-CM | POA: Diagnosis not present

## 2019-11-28 DIAGNOSIS — M542 Cervicalgia: Secondary | ICD-10-CM

## 2019-11-28 MED ORDER — PREDNISONE 10 MG PO TABS: ORAL_TABLET | ORAL | 0 refills | Status: DC

## 2019-11-28 NOTE — Progress Notes (Signed)
   Subjective:    Patient ID: [name], male    DOB: 1961-06-14, 59 y.o.   MRN: [id number]  HPI Here to discuss a pain that started about 3 weeks ago and which has been persistent ever since. He has never had a pain like this before. It started in the back of the neck on the right side at the base of the skull. No recent trauma. The pain sometimes radiates up over the top of the head and leads to a generalized headache. Then after a week the pain began to spread around the right side of his head to the face, and now it involves the back teeth, both upper and lower, on the right side. He feels as if his teeth ache. Sometimes chewing is painful on the right side. No change in vision. No dizziness. No visible rashes. Ibuprofen and Tylenol do not help. No ST or fever. No loss of taste or smell.    Review of Systems  Constitutional: Negative.   HENT: Positive for dental problem. Negative for congestion, ear pain, facial swelling, mouth sores, postnasal drip, sinus pressure, sinus pain, sore throat and trouble swallowing.   Eyes: Negative.   Respiratory: Negative.   Cardiovascular: Negative.   Musculoskeletal: Positive for neck pain.  Neurological: Positive for headaches. Negative for dizziness, tremors, seizures, syncope, facial asymmetry, speech difficulty, weakness, light-headedness and numbness.       Objective:   Physical Exam Constitutional:      Appearance: Normal appearance.  HENT:     Head: Normocephalic and atraumatic.     Right Ear: Tympanic membrane, ear canal and external ear normal.     Left Ear: Tympanic membrane, ear canal and external ear normal.     Nose: Nose normal.     Mouth/Throat:     Pharynx: Oropharynx is clear.  Eyes:     Extraocular Movements: Extraocular movements intact.     Conjunctiva/sclera: Conjunctivae normal.     Pupils: Pupils are equal, round, and reactive to light.  Neck:     Comments: He is mildly tender in the right posterior neck just  below the skull. Full ROM but extremes of right or left rotation is very painful  Cardiovascular:     Rate and Rhythm: Normal rate and regular rhythm.     Pulses: Normal pulses.     Heart sounds: Normal heart sounds.  Pulmonary:     Effort: Pulmonary effort is normal.     Breath sounds: Normal breath sounds.  Musculoskeletal:     Cervical back: Normal range of motion. No rigidity.  Lymphadenopathy:     Cervical: No cervical adenopathy.  Neurological:     General: No focal deficit present.     Mental Status: He is alert and oriented to person, place, and time. Mental status is at baseline.     Cranial Nerves: No cranial nerve deficit.     Motor: No weakness.     Gait: Gait normal.           Assessment & Plan:  This pain best fits the pattern of a trigeminal neuralgia. He will take a Prednisone taper to begin with 60 mg daily. We will also set up a head CT to rule out other possibilities.  Alysia Penna, MD

## 2019-11-29 ENCOUNTER — Ambulatory Visit (INDEPENDENT_AMBULATORY_CARE_PROVIDER_SITE_OTHER)
Admission: RE | Admit: 2019-11-29 | Discharge: 2019-11-29 | Disposition: A | Discharge status: Home / Self Care | Payer: PPO | Source: Ambulatory Visit | Attending: Family Medicine | Admitting: Family Medicine

## 2019-11-29 DIAGNOSIS — R519 Headache, unspecified: Secondary | ICD-10-CM | POA: Diagnosis not present

## 2019-11-30 ENCOUNTER — Other Ambulatory Visit: Payer: PPO

## 2019-12-02 ENCOUNTER — Other Ambulatory Visit: Payer: Self-pay | Admitting: Family Medicine

## 2019-12-03 NOTE — Telephone Encounter (Signed)
Please advise. Should the patient continue this medication? 

## 2019-12-10 ENCOUNTER — Telehealth: Payer: Self-pay | Admitting: Family Medicine

## 2019-12-10 NOTE — Chronic Care Management (AMB) (Signed)
°  Chronic Care Management   Note  12/10/2019 Name: ADONIAS ABUD MRN: [id number] DOB: July 04, 1961  PEDRITO SHELLHORN is a 59 y.o. year old male who is a primary care patient of [name], MD. I reached out to Philmore Pali by phone today in response to a referral sent by Mr. Lisette Grinder Milnes's PCP, [name], MD.   Mr. Duhn was given information about Chronic Care Management services today including:  1. CCM service includes personalized support from designated clinical staff supervised by his physician, including individualized plan of care and coordination with other care providers 2. 24/7 contact phone numbers for assistance for urgent and routine care needs. 3. Service will only be billed when office clinical staff spend 20 minutes or more in a month to coordinate care. 4. Only one practitioner may furnish and bill the service in a calendar month. 5. The patient may stop CCM services at any time (effective at the end of the month) by phone call to the office staff.   Patient agreed to services and verbal consent obtained.   Follow up plan:   Raynicia Dukes [employer] Scheduler

## 2019-12-10 NOTE — Progress Notes (Signed)
°  Chronic Care Management   Outreach Note  12/10/2019 Name: NORLAN KAPRAL MRN: [id number] DOB: August 27, 1961  Referred by: [name], MD Reason for referral : No chief complaint on file.   An unsuccessful telephone outreach was attempted today. The patient was referred to the pharmacist for assistance with care management and care coordination.   Follow Up Plan:   Raynicia Dukes [employer] Scheduler

## 2019-12-20 DIAGNOSIS — R52 Pain, unspecified: Secondary | ICD-10-CM | POA: Diagnosis not present

## 2019-12-27 ENCOUNTER — Telehealth: Payer: PPO

## 2020-01-09 DIAGNOSIS — G894 Chronic pain syndrome: Secondary | ICD-10-CM | POA: Diagnosis not present

## 2020-01-09 DIAGNOSIS — M5416 Radiculopathy, lumbar region: Secondary | ICD-10-CM | POA: Diagnosis not present

## 2020-01-09 DIAGNOSIS — G5752 Tarsal tunnel syndrome, left lower limb: Secondary | ICD-10-CM | POA: Diagnosis not present

## 2020-01-09 DIAGNOSIS — Z79891 Long term (current) use of opiate analgesic: Secondary | ICD-10-CM | POA: Diagnosis not present

## 2020-01-16 ENCOUNTER — Encounter: Payer: Self-pay | Admitting: Family Medicine

## 2020-01-16 DIAGNOSIS — G8929 Other chronic pain: Secondary | ICD-10-CM

## 2020-01-16 DIAGNOSIS — M5442 Lumbago with sciatica, left side: Secondary | ICD-10-CM

## 2020-01-17 DIAGNOSIS — F322 Major depressive disorder, single episode, severe without psychotic features: Secondary | ICD-10-CM | POA: Diagnosis not present

## 2020-01-21 NOTE — Telephone Encounter (Signed)
This was done.

## 2020-01-21 NOTE — Telephone Encounter (Signed)
The referral was done  

## 2020-01-21 NOTE — Telephone Encounter (Signed)
Please advise 

## 2020-01-21 NOTE — Telephone Encounter (Signed)
Noted  

## 2020-01-23 NOTE — Telephone Encounter (Signed)
This has been sent to [facility] for assistance.

## 2020-03-05 DIAGNOSIS — G894 Chronic pain syndrome: Secondary | ICD-10-CM | POA: Diagnosis not present

## 2020-03-05 DIAGNOSIS — G5752 Tarsal tunnel syndrome, left lower limb: Secondary | ICD-10-CM | POA: Diagnosis not present

## 2020-03-05 DIAGNOSIS — Z79891 Long term (current) use of opiate analgesic: Secondary | ICD-10-CM | POA: Diagnosis not present

## 2020-03-05 DIAGNOSIS — M5416 Radiculopathy, lumbar region: Secondary | ICD-10-CM | POA: Diagnosis not present

## 2020-03-08 ENCOUNTER — Other Ambulatory Visit: Payer: Self-pay | Admitting: Family Medicine

## 2020-04-10 DIAGNOSIS — F322 Major depressive disorder, single episode, severe without psychotic features: Secondary | ICD-10-CM | POA: Diagnosis not present

## 2020-04-15 DIAGNOSIS — G5752 Tarsal tunnel syndrome, left lower limb: Secondary | ICD-10-CM | POA: Diagnosis not present

## 2020-04-15 DIAGNOSIS — R200 Anesthesia of skin: Secondary | ICD-10-CM | POA: Diagnosis not present

## 2020-04-15 DIAGNOSIS — R202 Paresthesia of skin: Secondary | ICD-10-CM | POA: Diagnosis not present

## 2020-04-30 DIAGNOSIS — G5752 Tarsal tunnel syndrome, left lower limb: Secondary | ICD-10-CM | POA: Diagnosis not present

## 2020-04-30 DIAGNOSIS — M5416 Radiculopathy, lumbar region: Secondary | ICD-10-CM | POA: Diagnosis not present

## 2020-04-30 DIAGNOSIS — G894 Chronic pain syndrome: Secondary | ICD-10-CM | POA: Diagnosis not present

## 2020-04-30 DIAGNOSIS — Z79891 Long term (current) use of opiate analgesic: Secondary | ICD-10-CM | POA: Diagnosis not present

## 2020-05-05 ENCOUNTER — Other Ambulatory Visit: Payer: Self-pay | Admitting: Family Medicine

## 2020-06-05 ENCOUNTER — Other Ambulatory Visit: Payer: Self-pay

## 2020-06-05 ENCOUNTER — Encounter: Payer: Self-pay | Admitting: Family Medicine

## 2020-06-05 ENCOUNTER — Ambulatory Visit (INDEPENDENT_AMBULATORY_CARE_PROVIDER_SITE_OTHER): Payer: PPO | Admitting: Family Medicine

## 2020-06-05 VITALS — BP 128/80 | HR 101 | Temp 97.9°F | Ht 68.5 in | Wt 165.4 lb

## 2020-06-05 DIAGNOSIS — Z23 Encounter for immunization: Secondary | ICD-10-CM

## 2020-06-05 DIAGNOSIS — Z209 Contact with and (suspected) exposure to unspecified communicable disease: Secondary | ICD-10-CM

## 2020-06-05 DIAGNOSIS — Z0000 Encounter for general adult medical examination without abnormal findings: Secondary | ICD-10-CM | POA: Diagnosis not present

## 2020-06-05 MED ORDER — VALACYCLOVIR HCL 500 MG PO TABS
500.0000 mg | ORAL_TABLET | Freq: Two times a day (BID) | ORAL | 3 refills | Status: DC
Start: 2020-06-05 — End: 2021-11-09

## 2020-06-05 NOTE — Progress Notes (Signed)
   Subjective:    Patient ID: [name], male    DOB: 1961-09-01, 59 y.o.   MRN: [id number]  HPI    Review of Systems     Objective:   Physical Exam        Assessment & Plan:

## 2020-06-05 NOTE — Progress Notes (Signed)
Subjective:    Patient ID: [name], male    DOB: 21-Oct-1960, 59 y.o.   MRN: [id number]  HPI Here for a well exam. He has been doing well although he has compained of bilateral foot numbness for several years. He saw his Podiatrist, Dr. Sheran [name], who thinks he has tarsal tunnel syndrome. He recommended that [name] have a nerve conduction study ,but [name] declines this because he says he cannot stand the pain of having one. We have looked for potential etiologies for a neuropathy in the past, but none have been found. As noted in his chart, he does not tolerate either Gabapentin or Pregabalin. He was due for a colonoscopy this past spring. He is waking for exercise. He would prefer swimming, but he is nervous about being exposed to the Covid virus if he went inside a swimming facility. He has been fully vaccinated against Covid.    Review of Systems  Constitutional: Negative.   HENT: Negative.   Eyes: Negative.   Respiratory: Negative.   Cardiovascular: Negative.   Gastrointestinal: Negative.   Genitourinary: Negative.   Musculoskeletal: Positive for back pain.  Skin: Negative.   Neurological: Positive for numbness.  Psychiatric/Behavioral: Negative.        Objective:   Physical Exam Constitutional:      General: He is not in acute distress.    Appearance: Normal appearance. He is well-developed. He is not diaphoretic.  HENT:     Head: Normocephalic and atraumatic.     Right Ear: External ear normal.     Left Ear: External ear normal.     Nose: Nose normal.     Mouth/Throat:     Pharynx: No oropharyngeal exudate.  Eyes:     General: No scleral icterus.       Right eye: No discharge.        Left eye: No discharge.     Conjunctiva/sclera: Conjunctivae normal.     Pupils: Pupils are equal, round, and reactive to light.  Neck:     Thyroid: No thyromegaly.     Vascular: No JVD.     Trachea: No tracheal deviation.  Cardiovascular:     Rate and Rhythm: Normal  rate and regular rhythm.     Heart sounds: Normal heart sounds. No murmur heard.  No friction rub. No gallop.   Pulmonary:     Effort: Pulmonary effort is normal. No respiratory distress.     Breath sounds: Normal breath sounds. No wheezing or rales.  Chest:     Chest wall: No tenderness.  Abdominal:     General: Bowel sounds are normal. There is no distension.     Palpations: Abdomen is soft. There is no mass.     Tenderness: There is no abdominal tenderness. There is no guarding or rebound.  Genitourinary:    Penis: Normal. No tenderness.      Prostate: Normal.     Rectum: Normal. Guaiac result negative.  Musculoskeletal:        General: No tenderness. Normal range of motion.     Cervical back: Neck supple.  Lymphadenopathy:     Cervical: No cervical adenopathy.  Skin:    General: Skin is warm and dry.     Coloration: Skin is not pale.     Findings: No erythema or rash.  Neurological:     Mental Status: He is alert and oriented to person, place, and time.     Cranial Nerves: No cranial nerve deficit.  Motor: No abnormal muscle tone.     Coordination: Coordination normal.     Deep Tendon Reflexes: Reflexes are normal and symmetric. Reflexes normal.     Comments: Both feet are extremely sensitive to touch   Psychiatric:        Behavior: Behavior normal.        Thought Content: Thought content normal.        Judgment: Judgment normal.           Assessment & Plan:  Well exam. We discussed diet and exercise. Get fasting labs. We will again check for possible causes of neuropathy. Set up another colonoscopy. Given a flu shot. Ronald Penna, MD

## 2020-06-06 LAB — CBC WITH DIFFERENTIAL/PLATELET
Absolute Monocytes: 630 cells/uL (ref 200–950)
Basophils Absolute: 53 cells/uL (ref 0–200)
Basophils Relative: 0.7 %
Eosinophils Absolute: 68 cells/uL (ref 15–500)
Eosinophils Relative: 0.9 %
HCT: 52 % — ABNORMAL HIGH (ref 38.5–50.0)
Hemoglobin: 17.9 g/dL — ABNORMAL HIGH (ref 13.2–17.1)
Lymphs Abs: 1613 cells/uL (ref 850–3900)
MCH: 30.4 pg (ref 27.0–33.0)
MCHC: 34.4 g/dL (ref 32.0–36.0)
MCV: 88.4 fL (ref 80.0–100.0)
MPV: 11.6 fL (ref 7.5–12.5)
Monocytes Relative: 8.4 %
Neutro Abs: 5138 cells/uL (ref 1500–7800)
Neutrophils Relative %: 68.5 %
Platelets: 206 10*3/uL (ref 140–400)
RBC: 5.88 10*6/uL — ABNORMAL HIGH (ref 4.20–5.80)
RDW: 12.6 % (ref 11.0–15.0)
Total Lymphocyte: 21.5 %
WBC: 7.5 10*3/uL (ref 3.8–10.8)

## 2020-06-06 LAB — LIPID PANEL
Cholesterol: 257 mg/dL — ABNORMAL HIGH (ref <200)
HDL: 58 mg/dL (ref >=40)
LDL Cholesterol (Calc): 172 mg/dL (calc) — ABNORMAL HIGH
Non-HDL Cholesterol (Calc): 199 mg/dL (calc) — ABNORMAL HIGH (ref <130)
Total CHOL/HDL Ratio: 4.4 (calc) (ref <5.0)
Triglycerides: 132 mg/dL (ref <150)

## 2020-06-06 LAB — BASIC METABOLIC PANEL
BUN: 15 mg/dL (ref 7–25)
CO2: 25 mmol/L (ref 20–32)
Calcium: 9.6 mg/dL (ref 8.6–10.3)
Chloride: 102 mmol/L (ref 98–110)
Creat: 1.09 mg/dL (ref 0.70–1.33)
Glucose, Bld: 109 mg/dL — ABNORMAL HIGH (ref 65–99)
Potassium: 4 mmol/L (ref 3.5–5.3)
Sodium: 138 mmol/L (ref 135–146)

## 2020-06-06 LAB — HEPATIC FUNCTION PANEL
AG Ratio: 2 (calc) (ref 1.0–2.5)
ALT: 48 U/L — ABNORMAL HIGH (ref 9–46)
AST: 32 U/L (ref 10–35)
Albumin: 5 g/dL (ref 3.6–5.1)
Alkaline phosphatase (APISO): 75 U/L (ref 35–144)
Bilirubin, Direct: 0.2 mg/dL (ref 0.0–0.2)
Globulin: 2.5 g/dL (calc) (ref 1.9–3.7)
Indirect Bilirubin: 0.5 mg/dL (calc) (ref 0.2–1.2)
Total Bilirubin: 0.7 mg/dL (ref 0.2–1.2)
Total Protein: 7.5 g/dL (ref 6.1–8.1)

## 2020-06-06 LAB — VITAMIN B12: Vitamin B-12: 347 pg/mL (ref 200–1100)

## 2020-06-06 LAB — TSH: TSH: 1.19 mIU/L (ref 0.40–4.50)

## 2020-06-06 LAB — T3, FREE: T3, Free: 4.3 pg/mL — ABNORMAL HIGH (ref 2.3–4.2)

## 2020-06-06 LAB — T4, FREE: Free T4: 1.2 ng/dL (ref 0.8–1.8)

## 2020-06-06 LAB — HEMOGLOBIN A1C
Hgb A1c MFr Bld: 5.8 % of total Hgb — ABNORMAL HIGH (ref <5.7)
Mean Plasma Glucose: 120 (calc)
eAG (mmol/L): 6.6 (calc)

## 2020-06-06 LAB — PSA: PSA: 2.8 ng/mL (ref <=4.0)

## 2020-06-06 LAB — SARS COV-2 SEROLOGY(COVID-19)AB(IGG,IGM),IMMUNOASSAY
SARS CoV-2 AB IgG: NEGATIVE
SARS CoV-2 IgM: NEGATIVE

## 2020-06-25 DIAGNOSIS — M5416 Radiculopathy, lumbar region: Secondary | ICD-10-CM | POA: Diagnosis not present

## 2020-06-25 DIAGNOSIS — G5752 Tarsal tunnel syndrome, left lower limb: Secondary | ICD-10-CM | POA: Diagnosis not present

## 2020-06-25 DIAGNOSIS — Z79891 Long term (current) use of opiate analgesic: Secondary | ICD-10-CM | POA: Diagnosis not present

## 2020-06-25 DIAGNOSIS — G894 Chronic pain syndrome: Secondary | ICD-10-CM | POA: Diagnosis not present

## 2020-07-03 DIAGNOSIS — F322 Major depressive disorder, single episode, severe without psychotic features: Secondary | ICD-10-CM | POA: Diagnosis not present

## 2020-08-06 DIAGNOSIS — Z1211 Encounter for screening for malignant neoplasm of colon: Secondary | ICD-10-CM | POA: Diagnosis not present

## 2020-08-06 DIAGNOSIS — K921 Melena: Secondary | ICD-10-CM | POA: Diagnosis not present

## 2020-08-19 DIAGNOSIS — M961 Postlaminectomy syndrome, not elsewhere classified: Secondary | ICD-10-CM | POA: Diagnosis not present

## 2020-08-19 DIAGNOSIS — G5752 Tarsal tunnel syndrome, left lower limb: Secondary | ICD-10-CM | POA: Diagnosis not present

## 2020-08-19 DIAGNOSIS — Z79891 Long term (current) use of opiate analgesic: Secondary | ICD-10-CM | POA: Diagnosis not present

## 2020-08-19 DIAGNOSIS — G894 Chronic pain syndrome: Secondary | ICD-10-CM | POA: Diagnosis not present

## 2020-09-11 ENCOUNTER — Ambulatory Visit: Payer: PPO

## 2020-09-15 ENCOUNTER — Ambulatory Visit: Payer: PPO

## 2020-09-15 DIAGNOSIS — F5105 Insomnia due to other mental disorder: Secondary | ICD-10-CM | POA: Diagnosis not present

## 2020-09-15 DIAGNOSIS — F411 Generalized anxiety disorder: Secondary | ICD-10-CM | POA: Diagnosis not present

## 2020-09-15 DIAGNOSIS — F330 Major depressive disorder, recurrent, mild: Secondary | ICD-10-CM | POA: Diagnosis not present

## 2020-09-15 NOTE — Progress Notes (Signed)
Subjective:   [name] is a 59 y.o. male who presents for an Initial Medicare Annual Wellness Visit.  Virtual Visit via Video Note  I connected with [name] on 09/16/20 at  3:30 PM EST by a video enabled telemedicine application and verified that I am speaking with the correct person using two identifiers.  Location: Patient: Home  Provider: Office   I discussed the limitations of evaluation and management by telemedicine and the availability of in person appointments. The patient expressed understanding and agreed to proceed.    Ronald Neas, LPN    Review of Systems    N/A  Cardiac Risk Factors include: advanced age (>22mn, >>10women);male gender     Objective:    Today's Vitals   09/16/20 1544  PainSc: 5    There is no height or weight on file to calculate BMI.  Advanced Directives 09/16/2020  Does Patient Have a Medical Advance Directive? Yes  Type of [place]  Does patient want to make changes to medical advance directive? No - Patient declined  Copy of [place]? No - copy requested    Current Medications (verified) Outpatient Encounter Medications as of 09/16/2020  Medication Sig  . amLODipine (NORVASC) 10 MG tablet TAKE 1 TABLET BY MOUTH DAILY  . cetirizine (ZYRTEC) 10 MG tablet Take 10 mg by mouth daily.  . diazepam (VALIUM) 10 MG tablet Take 10 mg by mouth every 6 (six) hours as needed.  . diclofenac sodium (VOLTAREN) 1 % GEL Apply 1 application topically as needed.  . fentaNYL (DURAGESIC - DOSED MCG/HR) 50 MCG/HR Place 50 mcg onto the skin every other day.  . fluticasone (FLONASE) 50 MCG/ACT nasal spray USE TWO SPRAYS IN EACH NOSTRIL DAILY  . hydrocortisone-pramoxine (PROCTOFOAM HC) rectal foam USE AS DIRECTED 3 TIMES A DAY AS NEEDED  . hydrocortisone-pramoxine (PROCTOFOAM-HC) rectal foam Place 1 applicator rectally 2 (two) times daily.  .Marland Kitchen ketoconazole (NIZORAL) 200 MG tablet Take 1 tablet (200 mg total) by mouth daily.  .Marland Kitchenlactulose (CHRONULAC) 10 GM/15ML solution   . oxyCODONE (OXY IR/ROXICODONE) 5 MG immediate release tablet TAKE 1 TABLET BY MOUTH EVERY SIX HOURS AS NEEDED FOR PAIN  . promethazine (PHENERGAN) 25 MG tablet Reported on 10/30/2015  . traZODone (DESYREL) 100 MG tablet Take 50 mg by mouth at bedtime.  . triamcinolone cream (KENALOG) 0.1 % Apply 1 application topically 2 (two) times daily.  . valACYclovir (VALTREX) 500 MG tablet Take 1 tablet (500 mg total) by mouth 2 (two) times daily.  .Marland KitchenEPINEPHRINE 0.3 mg/0.3 mL IJ SOAJ injection USE AS DIRECTED (Patient not taking: Reported on 09/16/2020)  . [DISCONTINUED] predniSONE (DELTASONE) 10 MG tablet Take 6 tabs a day for 4 days, then 5 a day for 4 days, then 3 a day for 4 days, then 2 a day for 4 days, then 1 a day for 4 days, then stop  . [DISCONTINUED] predniSONE (DELTASONE) 20 MG tablet TAKE 1 TABLET (20 MG TOTAL) BY MOUTH 2 (TWO) TIMES DAILY WITH A MEAL.   No facility-administered encounter medications on file as of 09/16/2020.    Allergies (verified) Fire ant, Codeine, Cyclobenzaprine, Docusate sodium, Dulcolax stool softener [dss], Gabapentin, Ketorolac, Ketorolac tromethamine, Lisinopril, Methadone, and Pregabalin   History: Past Medical History:  Diagnosis Date  . ADHD (attention deficit hyperactivity disorder), inattentive type    [place]  . Anxiety  sees Dr. Celesta Aver in [place], [place]   . Arthritis   . Benign enlargement of prostate    sees [facility]   . Carpal tunnel syndrome on left    [facility]   . Chronic pain    sees Dr. Nicholaus Bloom (meds) and [facility] (accupuncture)   . Depression    sees Dr. Celesta Aver  . Functional impotence   . Hypogonadism in male    sees Larene Beach (a [place]) at [facility]   . Insomnia   . Kidney stones   . Sinusitis, acute 07/17/2016  .  Urticaria due to cold    Past Surgical History:  Procedure Laterality Date  . APPENDECTOMY    . CERVICAL FUSION     C6-7  . COLONOSCOPY  11/06/2009   per Dr. Teena Irani, no polyps, repeat in 10 yrs   . FOOT SURGERY Right    x 2  . LUMBAR DISC SURGERY     L5-S1, C 6-6   Family History  Problem Relation Age of Onset  . Heart disease Other   . Diabetes Other   . Hyperlipidemia Other   . Hypertension Sister   . Breast cancer Sister   . Heart disease Father   . Diabetes Father   . Heart attack Father        x 2  . Prostate cancer Neg Hx   . Kidney disease Neg Hx    Social History   Socioeconomic History  . Marital status: Married    Spouse name: Not on file  . Number of children: 0  . Years of education: Not on file  . 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 Smoking status: Former Research scientist (life sciences)  . Smokeless tobacco: Never Used  . Tobacco comment: quit 35 years  Substance and Sexual Activity  . Alcohol use: Yes    Alcohol/week: 0.0 standard drinks    Comment: occasional  . Drug use: No  . Sexual activity: Yes    Birth control/protection: Condom  Other Topics Concern  . Not on file  Social History Narrative   Lives with wife   Caffeine- coffee 1-2 cups, rarely a soda   Social Determinants of Health   Financial Resource Strain: Low Risk   . Difficulty of Paying Living Expenses: Not hard at all  Food Insecurity: No Food Insecurity  . Worried About Charity fundraiser in the Last Year: Never true  . Ran Out of Food in the Last Year: Never true  Transportation Needs: No Transportation Needs  . Lack of Transportation (Medical): No  . Lack of Transportation (Non-Medical): No  Physical Activity: Inactive  . Days of Exercise per Week: 0 days  . Minutes of Exercise per Session: 0 min  Stress: No Stress Concern Present  . Feeling of  Stress : Not at all  Social Connections: Moderately Isolated  . Frequency of Communication with Friends and Family: More than three times a week  . Frequency of Social Gatherings with Friends and Family: Three times a week  . Attends Religious Services: Never  . Active Member of Clubs or Organizations: No  . Attends Archivist Meetings: Never  . Marital Status: Married    Tobacco Counseling Counseling given: Not Answered Comment: quit 35 years   Clinical Intake:  Pre-visit preparation completed: Yes  Pain : 0-10 Pain  Score: 5  Pain Type: Chronic pain Pain Location: Foot (diffused) Pain Orientation: Left,Right Pain Descriptors / Indicators: Aching,Numbness,Burning,Tingling Pain Onset: More than a month ago Pain Frequency: Intermittent Pain Relieving Factors: Pain medication, Acupucture  Pain Relieving Factors: Pain medication, Acupucture  Nutritional Risks: None     Diabetic?No          Activities of Daily Living In your present state of health, do you have any difficulty performing the following activities: 09/16/2020 06/05/2020  Hearing? N N  Vision? N N  Difficulty concentrating or making decisions? N N  Walking or climbing stairs? N N  Dressing or bathing? N N  Doing errands, shopping? N N  Preparing Food and eating ? N -  Using the Toilet? N -  In the past six months, have you accidently leaked urine? N -  Do you have problems with loss of bowel control? N -  Managing your Medications? N -  Managing your Finances? N -  Housekeeping or managing your Housekeeping? N -  Some recent data might be hidden    Patient Care Team: [name], MD as PCP - General (Family Medicine) Earnie Larsson, [facility] (Pharmacist)  Indicate any recent Medical Services you may have received from other than [facility] providers in the past year (date may be approximate).     Assessment:   This is a routine wellness examination for [place].  Hearing/Vision  screen  Hearing Screening   '125Hz'  '250Hz'  '500Hz'  '1000Hz'  '2000Hz'  '3000Hz'  '4000Hz'  '6000Hz'  '8000Hz'   Right ear:           Left ear:           Vision Screening Comments: Patient stated has not had eyes examined in at least 2 years  Dietary issues and exercise activities discussed:    Goals    . Exercise 150 min/wk Moderate Activity      Depression Screen PHQ 2/9 Scores 09/16/2020 04/03/2019  PHQ - 2 Score 2 1  PHQ- 9 Score 2 -    Fall Risk Fall Risk  09/16/2020 04/03/2019  Falls in the past year? 0 0  Number falls in past yr: 0 -  Injury with Fall? 0 -  Risk for fall due to : Medication side effect -  Follow up Falls evaluation completed;Falls prevention discussed -    FALL RISK PREVENTION PERTAINING TO THE HOME:  Any stairs in or around the home? Yes  If so, are there any without handrails? No  Home free of loose throw rugs in walkways, pet beds, electrical cords, etc? Yes  Adequate lighting in your home to reduce risk of falls? Yes   ASSISTIVE DEVICES UTILIZED TO PREVENT FALLS:  Life alert? No  Use of a cane, walker or w/c? No  Grab bars in the bathroom? Yes  Shower chair or bench in shower? Yes  Elevated toilet seat or a handicapped toilet? No    Cognitive Function:   Normal cognitive status assessed by direct observation by this Nurse Health Advisor. No abnormalities found.        Immunizations Immunization History  Administered Date(s) Administered  . Influenza Split 08/26/2013  . Influenza Whole 07/02/2008, 07/20/2010  . Influenza, Quadrivalent, Recombinant, Inj, Pf 09/04/2018  . Influenza,inj,Quad PF,6+ Mos 06/24/2014, 09/09/2015, 10/26/2016, 09/04/2018, 06/05/2020  . MMR 04/12/2018  . PFIZER SARS-COV-2 Vaccination 12/10/2019, 12/31/2019, 08/16/2020  . Td 09/27/2000  . Tdap 09/16/2011  . Zoster Recombinat (Shingrix) 11/20/2018, 05/23/2019    TDAP status: Up to date  Flu Vaccine status: Up  to date  Pneumococcal vaccine status: Up to date  Covid-19 vaccine  status: Completed vaccines  Qualifies for Shingles Vaccine? Yes   Zostavax completed No   Shingrix Completed?: Yes  Screening Tests Health Maintenance  Topic Date Due  . HIV Screening  Never done  . COLONOSCOPY  11/07/2019  . TETANUS/TDAP  09/15/2021  . INFLUENZA VACCINE  Completed  . COVID-19 Vaccine  Completed  . Hepatitis C Screening  Completed    Health Maintenance  Health Maintenance Due  Topic Date Due  . HIV Screening  Never done  . COLONOSCOPY  11/07/2019    Colorectal cancer screening: Referral to GI placed 09/16/2020. Pt aware the office will call re: appt.  Lung Cancer Screening: (Low Dose CT Chest recommended if Age 32-80 years, 30 pack-year currently smoking OR have quit w/in 15years.) does not qualify.   Lung Cancer Screening Referral: N/A   Additional Screening:  Hepatitis C Screening: does qualify; Completed 01/01/2013  Vision Screening: Recommended annual ophthalmology exams for early detection of glaucoma and other disorders of the eye. Is the patient up to date with their annual eye exam?  No  Who is the provider or what is the name of the office in which the patient attends annual eye exams? Patient states will get his eyes examined soon If pt is not established with a provider, would they like to be referred to a provider to establish care? No .   Dental Screening: Recommended annual dental exams for proper oral hygiene  Community Resource Referral / Chronic Care Management: CRR required this visit?  No   CCM required this visit?  No      Plan:     I have personally reviewed and noted the following in the patient's chart:   . Medical and social history . Use of alcohol, tobacco or illicit drugs  . Current medications and supplements . Functional ability and status . Nutritional status . Physical activity . Advanced directives . List of other physicians . Hospitalizations, surgeries, and ER visits in previous 12  months . Vitals . Screenings to include cognitive, depression, and falls . Referrals and appointments  In addition, I have reviewed and discussed with patient certain preventive protocols, quality metrics, and best practice recommendations. A written personalized care plan for preventive services as well as general preventive health recommendations were provided to patient.     Ronald Neas, LPN   [id number]   Nurse Notes: None

## 2020-09-16 ENCOUNTER — Other Ambulatory Visit: Payer: Self-pay

## 2020-09-16 ENCOUNTER — Ambulatory Visit (INDEPENDENT_AMBULATORY_CARE_PROVIDER_SITE_OTHER): Payer: PPO

## 2020-09-16 DIAGNOSIS — Z0000 Encounter for general adult medical examination without abnormal findings: Secondary | ICD-10-CM | POA: Diagnosis not present

## 2020-09-16 NOTE — Patient Instructions (Signed)
[name] , Thank you for taking time to come for your Medicare Wellness Visit. I appreciate your ongoing commitment to your health goals. Please review the following plan we discussed and let me know if I can assist you in the future.   Screening recommendations/referrals: Colonoscopy: Currently due, please keep upcoming Colonoscopy scheduled  Recommended yearly ophthalmology/optometry visit for glaucoma screening and checkup Recommended yearly dental visit for hygiene and checkup  Vaccinations: Influenza vaccine: Up to date. Next due fall 2022 Pneumococcal vaccine: not currently due  Tdap vaccine: Up to date, next due 09/15/2021 Shingles vaccine: Completed series    Advanced directives: Please bring in copies of your advanced medical directives into our office so that we may scan them into your chart   Conditions/risks identified: None   Next appointment: None   Preventive Care 40-64 Years, Male Preventive care refers to lifestyle choices and visits with your health care provider that can promote health and wellness. What does preventive care include?  A yearly physical exam. This is also called an annual well check.  Dental exams once or twice a year.  Routine eye exams. Ask your health care provider how often you should have your eyes checked.  Personal lifestyle choices, including:  Daily care of your teeth and gums.  Regular physical activity.  Eating a healthy diet.  Avoiding tobacco and drug use.  Limiting alcohol use.  Practicing safe sex.  Taking low-dose aspirin every day starting at age 38. What happens during an annual well check? The services and screenings done by your health care provider during your annual well check will depend on your age, overall health, lifestyle risk factors, and family history of disease. Counseling  Your health care provider may ask you questions about your:  Alcohol use.  Tobacco use.  Drug use.  Emotional  well-being.  Home and relationship well-being.  Sexual activity.  Eating habits.  Work and work Statistician. Screening  You may have the following tests or measurements:  Height, weight, and BMI.  Blood pressure.  Lipid and cholesterol levels. These may be checked every 5 years, or more frequently if you are over 56 years old.  Skin check.  Lung cancer screening. You may have this screening every year starting at age 56 if you have a 30-pack-year history of smoking and currently smoke or have quit within the past 15 years.  Fecal occult blood test (FOBT) of the stool. You may have this test every year starting at age 18.  Flexible sigmoidoscopy or colonoscopy. You may have a sigmoidoscopy every 5 years or a colonoscopy every 10 years starting at age 47.  Prostate cancer screening. Recommendations will vary depending on your family history and other risks.  Hepatitis C blood test.  Hepatitis B blood test.  Sexually transmitted disease (STD) testing.  Diabetes screening. This is done by checking your blood sugar (glucose) after you have not eaten for a while (fasting). You may have this done every 1-3 years. Discuss your test results, treatment options, and if necessary, the need for more tests with your health care provider. Vaccines  Your health care provider may recommend certain vaccines, such as:  Influenza vaccine. This is recommended every year.  Tetanus, diphtheria, and acellular pertussis (Tdap, Td) vaccine. You may need a Td booster every 10 years.  Zoster vaccine. You may need this after age 15.  Pneumococcal 13-valent conjugate (PCV13) vaccine. You may need this if you have certain conditions and have not been vaccinated.  Pneumococcal  polysaccharide (PPSV23) vaccine. You may need one or two doses if you smoke cigarettes or if you have certain conditions. Talk to your health care provider about which screenings and vaccines you need and how often you need  them. This information is not intended to replace advice given to you by your health care provider. Make sure you discuss any questions you have with your health care provider. Document Released: 10/10/2015 Document Revised: 06/02/2016 Document Reviewed: 07/15/2015 Elsevier Interactive Patient Education  2017 [place] Prevention in the Home Falls can cause injuries. They can happen to people of all ages. There are many things you can do to make your home safe and to help prevent falls. What can I do on the outside of my home?  Regularly fix the edges of walkways and driveways and fix any cracks.  Remove anything that might make you trip as you walk through a door, such as a raised step or threshold.  Trim any bushes or trees on the path to your home.  Use bright outdoor lighting.  Clear any walking paths of anything that might make someone trip, such as rocks or tools.  Regularly check to see if handrails are loose or broken. Make sure that both sides of any steps have handrails.  Any raised decks and porches should have guardrails on the edges.  Have any leaves, snow, or ice cleared regularly.  Use sand or salt on walking paths during winter.  Clean up any spills in your garage right away. This includes oil or grease spills. What can I do in the bathroom?  Use night lights.  Install grab bars by the toilet and in the tub and shower. Do not use towel bars as grab bars.  Use non-skid mats or decals in the tub or shower.  If you need to sit down in the shower, use a plastic, non-slip stool.  Keep the floor dry. Clean up any water that spills on the floor as soon as it happens.  Remove soap buildup in the tub or shower regularly.  Attach bath mats securely with double-sided non-slip rug tape.  Do not have throw rugs and other things on the floor that can make you trip. What can I do in the bedroom?  Use night lights.  Make sure that you have a light by your  bed that is easy to reach.  Do not use any sheets or blankets that are too big for your bed. They should not hang down onto the floor.  Have a firm chair that has side arms. You can use this for support while you get dressed.  Do not have throw rugs and other things on the floor that can make you trip. What can I do in the kitchen?  Clean up any spills right away.  Avoid walking on wet floors.  Keep items that you use a lot in easy-to-reach places.  If you need to reach something above you, use a strong step stool that has a grab bar.  Keep electrical cords out of the way.  Do not use floor polish or wax that makes floors slippery. If you must use wax, use non-skid floor wax.  Do not have throw rugs and other things on the floor that can make you trip. What can I do with my stairs?  Do not leave any items on the stairs.  Make sure that there are handrails on both sides of the stairs and use them. Fix handrails that are  broken or loose. Make sure that handrails are as long as the stairways.  Check any carpeting to make sure that it is firmly attached to the stairs. Fix any carpet that is loose or worn.  Avoid having throw rugs at the top or bottom of the stairs. If you do have throw rugs, attach them to the floor with carpet tape.  Make sure that you have a light switch at the top of the stairs and the bottom of the stairs. If you do not have them, ask someone to add them for you. What else can I do to help prevent falls?  Wear shoes that:  Do not have high heels.  Have rubber bottoms.  Are comfortable and fit you well.  Are closed at the toe. Do not wear sandals.  If you use a stepladder:  Make sure that it is fully opened. Do not climb a closed stepladder.  Make sure that both sides of the stepladder are locked into place.  Ask someone to hold it for you, if possible.  Clearly mark and make sure that you can see:  Any grab bars or handrails.  First and last  steps.  Where the edge of each step is.  Use tools that help you move around (mobility aids) if they are needed. These include:  Canes.  Walkers.  Scooters.  Crutches.  Turn on the lights when you go into a dark area. Replace any light bulbs as soon as they burn out.  Set up your furniture so you have a clear path. Avoid moving your furniture around.  If any of your floors are uneven, fix them.  If there are any pets around you, be aware of where they are.  Review your medicines with your doctor. Some medicines can make you feel dizzy. This can increase your chance of falling. Ask your doctor what other things that you can do to help prevent falls. This information is not intended to replace advice given to you by your health care provider. Make sure you discuss any questions you have with your health care provider. Document Released: 07/10/2009 Document Revised: 02/19/2016 Document Reviewed: 10/18/2014 Elsevier Interactive Patient Education  2017 [employer].

## 2020-09-25 DIAGNOSIS — F322 Major depressive disorder, single episode, severe without psychotic features: Secondary | ICD-10-CM | POA: Diagnosis not present

## 2020-10-14 DIAGNOSIS — M961 Postlaminectomy syndrome, not elsewhere classified: Secondary | ICD-10-CM | POA: Diagnosis not present

## 2020-10-14 DIAGNOSIS — G894 Chronic pain syndrome: Secondary | ICD-10-CM | POA: Diagnosis not present

## 2020-10-14 DIAGNOSIS — Z79891 Long term (current) use of opiate analgesic: Secondary | ICD-10-CM | POA: Diagnosis not present

## 2020-10-14 DIAGNOSIS — G5752 Tarsal tunnel syndrome, left lower limb: Secondary | ICD-10-CM | POA: Diagnosis not present

## 2020-10-22 DIAGNOSIS — Z01812 Encounter for preprocedural laboratory examination: Secondary | ICD-10-CM | POA: Diagnosis not present

## 2020-10-27 DIAGNOSIS — K635 Polyp of colon: Secondary | ICD-10-CM | POA: Diagnosis not present

## 2020-10-27 DIAGNOSIS — K921 Melena: Secondary | ICD-10-CM | POA: Diagnosis not present

## 2020-10-29 DIAGNOSIS — K635 Polyp of colon: Secondary | ICD-10-CM | POA: Diagnosis not present

## 2020-10-30 ENCOUNTER — Other Ambulatory Visit: Payer: PPO

## 2020-10-30 DIAGNOSIS — Z20822 Contact with and (suspected) exposure to covid-19: Secondary | ICD-10-CM | POA: Diagnosis not present

## 2020-10-31 ENCOUNTER — Other Ambulatory Visit: Payer: PPO

## 2020-10-31 LAB — SARS-COV-2, NAA 2 DAY TAT

## 2020-10-31 LAB — NOVEL CORONAVIRUS, NAA: SARS-CoV-2, NAA: NOT DETECTED

## 2020-12-09 DIAGNOSIS — G5752 Tarsal tunnel syndrome, left lower limb: Secondary | ICD-10-CM | POA: Diagnosis not present

## 2020-12-09 DIAGNOSIS — G894 Chronic pain syndrome: Secondary | ICD-10-CM | POA: Diagnosis not present

## 2020-12-09 DIAGNOSIS — Z79891 Long term (current) use of opiate analgesic: Secondary | ICD-10-CM | POA: Diagnosis not present

## 2020-12-09 DIAGNOSIS — M961 Postlaminectomy syndrome, not elsewhere classified: Secondary | ICD-10-CM | POA: Diagnosis not present

## 2020-12-22 ENCOUNTER — Encounter: Payer: Self-pay | Admitting: Family Medicine

## 2020-12-22 ENCOUNTER — Other Ambulatory Visit: Payer: Self-pay

## 2020-12-22 ENCOUNTER — Ambulatory Visit (INDEPENDENT_AMBULATORY_CARE_PROVIDER_SITE_OTHER): Payer: PPO | Admitting: Family Medicine

## 2020-12-22 VITALS — BP 136/86 | HR 95 | Temp 98.3°F | Wt 170.8 lb

## 2020-12-22 DIAGNOSIS — M549 Dorsalgia, unspecified: Secondary | ICD-10-CM | POA: Diagnosis not present

## 2020-12-22 LAB — POC URINALSYSI DIPSTICK (AUTOMATED)
Bilirubin, UA: NEGATIVE
Blood, UA: NEGATIVE
Glucose, UA: NEGATIVE
Ketones, UA: NEGATIVE
Leukocytes, UA: NEGATIVE
Nitrite, UA: NEGATIVE
Protein, UA: NEGATIVE
Spec Grav, UA: 1.025 (ref 1.010–1.025)
Urobilinogen, UA: 0.2 E.U./dL
pH, UA: 6 (ref 5.0–8.0)

## 2020-12-22 MED ORDER — PREDNISONE 10 MG PO TABS: ORAL_TABLET | ORAL | 0 refills | Status: DC

## 2020-12-22 NOTE — Progress Notes (Signed)
   Subjective:    Patient ID: [name], male    DOB: September 17, 1961, 60 y.o.   MRN: [id number]  HPI Here for 4 days of sharp pain in the left lower back. This does not radiate to the legs. No recent trauma. No urinary or bowel symptoms. Ibuprofen and heat help a little. He is S/P disc surgery at L5-S1. 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    Review of Systems  Constitutional: Negative.   Respiratory: Negative.   Cardiovascular: Negative.   Gastrointestinal: Negative.   Genitourinary: Negative.   Musculoskeletal: Positive for back pain.       Objective:   Physical Exam Constitutional:      Comments: In pain   Cardiovascular:     Rate and Rhythm: Normal rate and regular rhythm.     Pulses: Normal pulses.     Heart sounds: Normal heart sounds.  Pulmonary:     Effort: Pulmonary effort is normal.     Breath sounds: Normal breath sounds.  Abdominal:     Tenderness: There is no right CVA tenderness or left CVA tenderness.  Musculoskeletal:     Comments: Tender along the left edge of the lumbar spine. No spasms. ROM is full to flexion and extension, but rotation causes pain  Neurological:     Mental Status: He is alert.           Assessment & Plan:  Low back pain due to facet arthritis. He will use Robaxin and a Prednisone taper over 12 days, starting with 40 mg a day. Recheck prn. Alysia Penna, MD

## 2021-01-08 HISTORY — PX: COLONOSCOPY: SHX174

## 2021-01-15 DIAGNOSIS — F322 Major depressive disorder, single episode, severe without psychotic features: Secondary | ICD-10-CM | POA: Diagnosis not present

## 2021-02-04 DIAGNOSIS — G894 Chronic pain syndrome: Secondary | ICD-10-CM | POA: Diagnosis not present

## 2021-02-04 DIAGNOSIS — G5752 Tarsal tunnel syndrome, left lower limb: Secondary | ICD-10-CM | POA: Diagnosis not present

## 2021-02-04 DIAGNOSIS — Z79891 Long term (current) use of opiate analgesic: Secondary | ICD-10-CM | POA: Diagnosis not present

## 2021-02-04 DIAGNOSIS — M961 Postlaminectomy syndrome, not elsewhere classified: Secondary | ICD-10-CM | POA: Diagnosis not present

## 2021-02-06 ENCOUNTER — Other Ambulatory Visit: Payer: Self-pay | Admitting: Family Medicine

## 2021-03-25 ENCOUNTER — Telehealth (INDEPENDENT_AMBULATORY_CARE_PROVIDER_SITE_OTHER): Payer: PPO | Admitting: Family Medicine

## 2021-03-25 ENCOUNTER — Encounter: Payer: Self-pay | Admitting: Family Medicine

## 2021-03-25 DIAGNOSIS — U071 COVID-19: Secondary | ICD-10-CM

## 2021-03-25 MED ORDER — MOLNUPIRAVIR EUA 200MG CAPSULE: 4.0000 | ORAL_CAPSULE | Freq: Two times a day (BID) | ORAL | 0 refills | Status: AC

## 2021-03-25 NOTE — Progress Notes (Signed)
Subjective:    Patient ID: [name], male    DOB: April 16, 1961, 60 y.o.   MRN: [id number]  HPI Virtual Visit via Video Note  I connected with the patient on 03/25/21 at  2:30 PM EDT by a video enabled telemedicine application and verified that I am speaking with the correct person using two identifiers.  Location patient: home Location provider:work or home office Persons participating in the virtual visit: patient, provider  I discussed the limitations of evaluation and management by telemedicine and the availability of in person appointments. The patient expressed understanding and agreed to proceed.   HPI: Here for a Covid-19 infection. He [place] at the beach with his wife and mother-in-law, and now all 3 of them have the virus. He began to feel ill 3 days ago, and he tested positive for the virus last night. He has fever, body aches, loss of taste and smell, a dry cough, ST, headache, and some diarrhea. No SOB or chest pain. Heis drinking fluids and taking Tylenol.   ROS: See pertinent positives and negatives per HPI.  Past Medical History:  Diagnosis Date   ADHD (attention deficit hyperactivity disorder), inattentive type    sees Dr. Celesta Aver in [place], [place]    sees Dr. Celesta Aver in [place], [place]    Arthritis    Benign enlargement of prostate    sees [facility]    Carpal tunnel syndrome on left    sees Dr. Eugene Garnet at [facility]    sees Dr. Nicholaus Bloom (meds) and Dorathy Kinsman (accupuncture)    Depression    sees Dr. Celesta Aver   Functional impotence    Hypogonadism in male    sees Larene Beach (a [place]) at [facility]    Insomnia    Kidney stones    Sinusitis, acute 07/17/2016   Urticaria due to cold     Past Surgical History:  Procedure Laterality Date   APPENDECTOMY     CERVICAL FUSION     C6-7   COLONOSCOPY  11/06/2009   per Dr. Teena Irani, no polyps, repeat in 10 yrs    FOOT SURGERY Right     x 2   LUMBAR DISC SURGERY     L5-S1, C 6-6    Family History  Problem Relation Age of Onset   Heart disease Other    Diabetes Other    Hyperlipidemia Other    Hypertension Sister    Breast cancer Sister    Heart disease Father    Diabetes Father    Heart attack Father        x 2   Prostate cancer Neg Hx    Kidney disease Neg Hx      Current Outpatient Medications:    amLODipine (NORVASC) 10 MG tablet, TAKE ONE TABLET EVERY DAY, Disp: 90 tablet, Rfl: 0   cetirizine (ZYRTEC) 10 MG tablet, Take 10 mg by mouth daily., Disp: , Rfl:    diazepam (VALIUM) 10 MG tablet, Take 10 mg by mouth every 6 (six) hours as needed., Disp: , Rfl:    diclofenac sodium (VOLTAREN) 1 % GEL, Apply 1 application topically as needed., Disp: , Rfl:    EPINEPHRINE 0.3 mg/0.3 mL IJ SOAJ injection, USE AS DIRECTED, Disp: 2 each, Rfl: 2   fentaNYL (DURAGESIC - DOSED MCG/HR) 50 MCG/HR, Place 50 mcg onto the skin every other day., Disp: , Rfl:    fluticasone (FLONASE) 50  MCG/ACT nasal spray, USE TWO SPRAYS IN EACH NOSTRIL DAILY, Disp: 16 g, Rfl: 11   hydrocortisone-pramoxine (PROCTOFOAM HC) rectal foam, USE AS DIRECTED 3 TIMES A DAY AS NEEDED, Disp: 10 g, Rfl: 5   hydrocortisone-pramoxine (PROCTOFOAM-HC) rectal foam, Place 1 applicator rectally 2 (two) times daily., Disp: , Rfl:    ketoconazole (NIZORAL) 200 MG tablet, Take 1 tablet (200 mg total) by mouth daily., Disp: 90 tablet, Rfl: 0   lactulose (CHRONULAC) 10 GM/15ML solution, , Disp: , Rfl:    molnupiravir EUA 200 mg CAPS, Take 4 capsules (800 mg total) by mouth 2 (two) times daily for 5 days., Disp: 40 capsule, Rfl: 0   oxyCODONE (OXY IR/ROXICODONE) 5 MG immediate release tablet, TAKE 1 TABLET BY MOUTH EVERY SIX HOURS AS NEEDED FOR PAIN, Disp: , Rfl: 0   predniSONE (DELTASONE) 10 MG tablet, Take 4 tabs a day for 3 days, then 3 a day for 3 days ,then 2 a day for 3 days, then 1 a day for 3 days, then stop, Disp: 40 tablet, Rfl: 0   promethazine (PHENERGAN)  25 MG tablet, Reported on 10/30/2015, Disp: , Rfl:    traZODone (DESYREL) 100 MG tablet, Take 50 mg by mouth at bedtime., Disp: , Rfl:    triamcinolone cream (KENALOG) 0.1 %, Apply 1 application topically 2 (two) times daily., Disp: 30 g, Rfl: 0   valACYclovir (VALTREX) 500 MG tablet, Take 1 tablet (500 mg total) by mouth 2 (two) times daily., Disp: 180 tablet, Rfl: 3  EXAM:  VITALS per patient if applicable:  GENERAL: alert, oriented, appears well and in no acute distress  HEENT: atraumatic, conjunttiva clear, no obvious abnormalities on inspection of external nose and ears  NECK: normal movements of the head and neck  LUNGS: on inspection no signs of respiratory distress, breathing rate appears normal, no obvious gross SOB, gasping or wheezing  CV: no obvious cyanosis  MS: moves all visible extremities without noticeable abnormality  PSYCH/NEURO: pleasant and cooperative, no obvious depression or anxiety, speech and thought processing grossly intact  ASSESSMENT AND PLAN: Covid-19 infection. Treat with 5 days of Molnupiravir. Quarantine for 5 days.  Alysia Penna, MD  Discussed the following assessment and plan:  No diagnosis found.     I discussed the assessment and treatment plan with the patient. The patient was provided an opportunity to ask questions and all were answered. The patient agreed with the plan and demonstrated an understanding of the instructions.   The patient was advised to call back or seek an in-person evaluation if the symptoms worsen or if the condition fails to improve as anticipated.      Review of Systems     Objective:   Physical Exam        Assessment & Plan:

## 2021-04-01 DIAGNOSIS — G894 Chronic pain syndrome: Secondary | ICD-10-CM | POA: Diagnosis not present

## 2021-04-01 DIAGNOSIS — M961 Postlaminectomy syndrome, not elsewhere classified: Secondary | ICD-10-CM | POA: Diagnosis not present

## 2021-04-01 DIAGNOSIS — G5752 Tarsal tunnel syndrome, left lower limb: Secondary | ICD-10-CM | POA: Diagnosis not present

## 2021-04-01 DIAGNOSIS — Z79891 Long term (current) use of opiate analgesic: Secondary | ICD-10-CM | POA: Diagnosis not present

## 2021-04-15 ENCOUNTER — Other Ambulatory Visit: Payer: Self-pay | Admitting: Family Medicine

## 2021-05-04 ENCOUNTER — Encounter: Payer: Self-pay | Admitting: Family Medicine

## 2021-05-05 NOTE — Telephone Encounter (Signed)
Set up an in person OV so I can examine him

## 2021-05-08 ENCOUNTER — Other Ambulatory Visit: Payer: Self-pay

## 2021-05-08 ENCOUNTER — Encounter: Payer: Self-pay | Admitting: Family Medicine

## 2021-05-08 ENCOUNTER — Ambulatory Visit (INDEPENDENT_AMBULATORY_CARE_PROVIDER_SITE_OTHER): Payer: PPO | Admitting: Family Medicine

## 2021-05-08 VITALS — BP 134/82 | HR 103 | Temp 98.4°F | Wt 164.4 lb

## 2021-05-08 DIAGNOSIS — M5442 Lumbago with sciatica, left side: Secondary | ICD-10-CM | POA: Diagnosis not present

## 2021-05-08 DIAGNOSIS — Z981 Arthrodesis status: Secondary | ICD-10-CM | POA: Diagnosis not present

## 2021-05-08 DIAGNOSIS — M5441 Lumbago with sciatica, right side: Secondary | ICD-10-CM

## 2021-05-08 DIAGNOSIS — R208 Other disturbances of skin sensation: Secondary | ICD-10-CM | POA: Diagnosis not present

## 2021-05-08 DIAGNOSIS — G8929 Other chronic pain: Secondary | ICD-10-CM

## 2021-05-08 DIAGNOSIS — Z4889 Encounter for other specified surgical aftercare: Secondary | ICD-10-CM

## 2021-05-08 NOTE — Patient Instructions (Signed)
For continued or worsening symptoms proceed to nearest emergency department.

## 2021-05-08 NOTE — Progress Notes (Signed)
Subjective:    Patient ID: [name], male    DOB: Feb 20, 1961, 60 y.o.   MRN: [id number]  Chief Complaint  Patient presents with   Knee Pain    Has pain starting from infusion site on lower back that goes down his leg and wraps around to the knee, states it feels like pressure and it could pop off. Stated it was just the right leg but woke up this morning and left leg is feeling the same way   Patient accompanied by his mother.  HPI Patient was seen today for acute concern.  Patient endorses history of lumbar fusion x3  (Drs. Louanne Skye and Agapito Games, [facility]) and cervical fusion with Dr. Patrice Paradise.  Pt endorses R sided low back pain with radiation of pain/pressure into thigh and into R knee x 2 wks.  "Feels like my kneecap is going to pop off".  Similar sensation started in LLE today.  Tried tylenol, ice.  Denies weakness in LEs, fever, chills, loss of bowel or bladder, edema in LE.  Able to press on a pinpoint area and lower back to relieve the sensation.  Past Medical History:  Diagnosis Date   ADHD (attention deficit hyperactivity disorder), inattentive type    sees Dr. Celesta Aver in [place], [place]    sees Dr. Celesta Aver in [place], [place]    Arthritis    Benign enlargement of prostate    sees [facility]    Carpal tunnel syndrome on left    sees Dr. Eugene Garnet at [facility]    sees Dr. Nicholaus Bloom (meds) and Dorathy Kinsman (accupuncture)    Depression    sees Dr. Celesta Aver   Functional impotence    Hypogonadism in male    sees Larene Beach (a [place]) at [facility]    Insomnia    Kidney stones    Sinusitis, acute 07/17/2016   Urticaria due to cold     Allergies  Allergen Reactions   Fire Ant Anaphylaxis    Has Epi pen Has Epi pen    Codeine     REACTION: ITCHING   Cyclobenzaprine Other (See Comments)    Nightmares   Docusate Sodium     swelling   Dulcolax Stool Softener [Dss]     swelling   Gabapentin     migraine    Ketorolac     Other reaction(s): Vomiting (intolerance)   Ketorolac Tromethamine Other (See Comments)    REACTION: SEVERE VOMMITING   Lisinopril     Cough & chest tightness   Methadone Other (See Comments)    Loss of appetite   Pregabalin     headaches Other reaction(s): Confusion (intolerance)    ROS General: Denies fever, chills, night sweats, changes in weight, changes in appetite HEENT: Denies headaches, ear pain, changes in vision, rhinorrhea, sore throat CV: Denies CP, palpitations, SOB, orthopnea Pulm: Denies SOB, cough, wheezing GI: Denies abdominal pain, nausea, vomiting, diarrhea, constipation GU: Denies dysuria, hematuria, frequency Msk: Denies muscle cramps, joint pains  + chronic back pain, right thigh and R knee pain/pressure Neuro: Denies weakness, numbness, tingling Skin: Denies rashes, bruising Psych: Denies depression, anxiety, hallucinations     Objective:    Blood pressure 134/82, pulse (!) 103, temperature 98.4 F (36.9 C), temperature source Oral, weight 164 lb 6.4 oz (74.6 kg), SpO2 96 %.  Gen. Pleasant, well-nourished, in no distress, normal affect   HEENT: [place]/AT, face symmetric, conjunctiva clear, no scleral icterus,  PERRLA, EOMI, nares patent without drainage, pharynx without erythema or exudate. Neck: No JVD, no thyromegaly, no carotid bruits Lungs: no accessory muscle use, CTAB, no wheezes or rales Cardiovascular: RRR, no m/r/g, no peripheral edema Musculoskeletal: No deformities, no cyanosis or clubbing, normal tone Neuro:  A&Ox3, CN II-XII intact, normal gait.  Decreased sensation to pinprick and soft touch in bilateral Les.  Unable to elicit bilateral patellar or Achilles tendon reflexes. Skin:  Warm, no lesions/ rash   Wt Readings from Last 3 Encounters:  05/08/21 164 lb 6.4 oz (74.6 kg)  12/22/20 170 lb 12.8 oz (77.5 kg)  06/05/20 165 lb 6 oz (75 kg)    Lab Results  Component Value Date   WBC 7.5 06/05/2020   HGB 17.9 (H) 06/05/2020    HCT 52.0 (H) 06/05/2020   PLT 206 06/05/2020   GLUCOSE 109 (H) 06/05/2020   CHOL 257 (H) 06/05/2020   TRIG 132 06/05/2020   HDL 58 06/05/2020   LDLDIRECT 179.9 12/18/2012   LDLCALC 172 (H) 06/05/2020   ALT 48 (H) 06/05/2020   AST 32 06/05/2020   NA 138 06/05/2020   K 4.0 06/05/2020   CL 102 06/05/2020   CREATININE 1.09 06/05/2020   BUN 15 06/05/2020   CO2 25 06/05/2020   TSH 1.19 06/05/2020   PSA 2.8 06/05/2020   HGBA1C 5.8 (H) 06/05/2020   MICROALBUR 4.5 (H) 12/02/2014    Assessment/Plan:  S/P lumbar fusion  - Plan: MR Lumbar Spine Wo Contrast  Chronic bilateral low back pain with bilateral sciatica  - Plan: MR Lumbar Spine Wo Contrast  Decreased sensation of lower extremity  - Plan: MR Lumbar Spine Wo Contrast  Given increased symptoms, physical exam, and history of lumbar fusions discussed need for imaging.  Concern for new disc herniation, arthritis, loosening of spinal hardware causing radicular pain.  Discussed supportive care.  Continue current medications.  Follow-up with neurosurgery.  Given strict precautions.  F/u as needed with PCP  Grier Mitts, MD

## 2021-05-11 ENCOUNTER — Ambulatory Visit
Admission: RE | Admit: 2021-05-11 | Discharge: 2021-05-11 | Disposition: A | Discharge status: Home / Self Care | Payer: PPO | Source: Ambulatory Visit | Attending: Family Medicine | Admitting: Family Medicine

## 2021-05-11 DIAGNOSIS — M5442 Lumbago with sciatica, left side: Secondary | ICD-10-CM

## 2021-05-11 DIAGNOSIS — G8929 Other chronic pain: Secondary | ICD-10-CM

## 2021-05-11 DIAGNOSIS — Z981 Arthrodesis status: Secondary | ICD-10-CM

## 2021-05-11 DIAGNOSIS — Z4889 Encounter for other specified surgical aftercare: Secondary | ICD-10-CM

## 2021-05-11 DIAGNOSIS — M545 Low back pain, unspecified: Secondary | ICD-10-CM | POA: Diagnosis not present

## 2021-05-11 DIAGNOSIS — R208 Other disturbances of skin sensation: Secondary | ICD-10-CM

## 2021-05-18 ENCOUNTER — Encounter: Payer: Self-pay | Admitting: Family Medicine

## 2021-05-20 ENCOUNTER — Encounter: Payer: Self-pay | Admitting: Family Medicine

## 2021-05-20 DIAGNOSIS — M5441 Lumbago with sciatica, right side: Secondary | ICD-10-CM

## 2021-05-20 DIAGNOSIS — G8929 Other chronic pain: Secondary | ICD-10-CM

## 2021-05-27 DIAGNOSIS — M961 Postlaminectomy syndrome, not elsewhere classified: Secondary | ICD-10-CM | POA: Diagnosis not present

## 2021-05-27 DIAGNOSIS — G5752 Tarsal tunnel syndrome, left lower limb: Secondary | ICD-10-CM | POA: Diagnosis not present

## 2021-05-27 DIAGNOSIS — G894 Chronic pain syndrome: Secondary | ICD-10-CM | POA: Diagnosis not present

## 2021-05-27 DIAGNOSIS — Z79891 Long term (current) use of opiate analgesic: Secondary | ICD-10-CM | POA: Diagnosis not present

## 2021-06-04 NOTE — Telephone Encounter (Signed)
Tell [name] that I reviewed the MRI report and I have put in a referral to [facility]. They will call him about an appt

## 2021-06-10 DIAGNOSIS — M542 Cervicalgia: Secondary | ICD-10-CM | POA: Diagnosis not present

## 2021-06-10 DIAGNOSIS — M47812 Spondylosis without myelopathy or radiculopathy, cervical region: Secondary | ICD-10-CM | POA: Diagnosis not present

## 2021-06-10 DIAGNOSIS — Z6824 Body mass index (BMI) 24.0-24.9, adult: Secondary | ICD-10-CM | POA: Diagnosis not present

## 2021-06-10 DIAGNOSIS — G894 Chronic pain syndrome: Secondary | ICD-10-CM | POA: Diagnosis not present

## 2021-06-16 ENCOUNTER — Encounter: Payer: Self-pay | Admitting: Emergency Medicine

## 2021-06-16 ENCOUNTER — Other Ambulatory Visit: Payer: Self-pay

## 2021-06-16 ENCOUNTER — Ambulatory Visit
Admission: EM | Admit: 2021-06-16 | Discharge: 2021-06-16 | Disposition: A | Discharge status: Home / Self Care | Payer: PPO | Attending: Emergency Medicine | Admitting: Emergency Medicine

## 2021-06-16 DIAGNOSIS — H6121 Impacted cerumen, right ear: Secondary | ICD-10-CM | POA: Diagnosis not present

## 2021-06-16 NOTE — ED Provider Notes (Signed)
[facility] [name]    CSN: [id number] Arrival date & time: 06/16/21  1244      History   Chief Complaint Chief Complaint  Patient presents with   Ear Fullness     HPI [name] is a 60 y.o. male.  Patient presents with right ear fullness and muffled hearing today.  He was attempting to clean out his ear and thinks he pushed wax deeper in.  He denies fever, chills, ear drainage, ear pain, or other symptoms.  His medical history includes hypertension, arthritis, chronic pain, ADHD, insomnia, anxiety, depression.  The history is provided by the patient and medical records.   Past Medical History:  Diagnosis Date   ADHD (attention deficit hyperactivity disorder), inattentive type    sees Dr. Celesta Aver in [place], [place]    sees Dr. Celesta Aver in [place], [place]    Arthritis    Benign enlargement of prostate    sees [facility]    Carpal tunnel syndrome on left    sees Dr. Eugene Garnet at [facility]    sees Dr. Nicholaus Bloom (meds) and Dorathy Kinsman (accupuncture)    Depression    sees Dr. Celesta Aver   Functional impotence    Hypogonadism in male    sees Larene Beach (a [place]) at [facility]    Insomnia    Kidney stones    Sinusitis, acute 07/17/2016   Urticaria due to cold     Patient Active Problem List   Diagnosis Date Noted   COVID-19 virus infection 03/25/2021   Tinea versicolor 11/08/2016   [place]orton neuroma, right 11/02/2016   Sinusitis, acute 07/17/2016   BPH with obstruction/lower urinary tract symptoms 05/28/2015   Hypogonadism in male 05/28/2015   Kidney stones 05/28/2015   Elevated blood sugar 12/02/2014   Hyperlipidemia, mixed 07/09/2014   Atypical chest pain 06/24/2014   Cough secondary to angiotensin converting enzyme inhibitor (ACE-I) 10/31/2013   Essential hypertension, benign 10/25/2013   History of hepatitis C 01/01/2013   PITUITARY INSUFFICIENCY 06/13/2009   INSOMNIA 02/28/2009   CHRONIC PAIN  SYNDROME 08/28/2007   Allergic rhinitis 08/28/2007   ANGIONEUROTIC EDEMA NOT ELSEWHERE CLASSIFIED 08/28/2007   DERMATITIS, CONTACT, NOS 05/03/2007    Past Surgical History:  Procedure Laterality Date   APPENDECTOMY     CERVICAL FUSION     C6-7   COLONOSCOPY  11/06/2009   per Dr. Teena Irani, no polyps, repeat in 10 yrs    FOOT SURGERY Right    x 2   LUMBAR DISC SURGERY     L5-S1, C 6-6       Home Medications    Prior to Admission medications   Medication Sig Start Date End Date Taking? Authorizing Provider  amLODipine (NORVASC) 10 MG tablet TAKE ONE TABLET EVERY DAY 04/15/21   [name], MD  cetirizine (ZYRTEC) 10 MG tablet Take 10 mg by mouth daily.    [provider]  diazepam (VALIUM) 10 MG tablet Take 10 mg by mouth every 6 (six) hours as needed.    [provider]  diclofenac sodium (VOLTAREN) 1 % GEL Apply 1 application topically as needed.    [provider]  EPINEPHRINE 0.3 mg/0.3 mL IJ SOAJ injection USE AS DIRECTED 05/06/20   [name], MD  fentaNYL (DURAGESIC - DOSED MCG/HR) 50 MCG/HR Place 50 mcg onto the skin every other day.    [provider]  fluticasone (FLONASE) 50 MCG/ACT nasal  spray USE TWO SPRAYS IN [facility] NOSTRIL DAILY 05/06/20   [name], MD  hydrocortisone-pramoxine [facility]) rectal foam USE AS DIRECTED 3 TIMES A DAY AS NEEDED 10/16/19   [name], MD  hydrocortisone-pramoxine [facility]) rectal foam Place 1 applicator rectally 2 (two) times daily.    [provider]  ketoconazole (NIZORAL) 200 MG tablet Take 1 tablet (200 mg total) by mouth daily. 11/08/16   [name], MD  lactulose [facility]) 10 GM/15ML solution  12/08/07   [provider]  oxyCODONE (OXY IR/ROXICODONE) 5 MG immediate release tablet TAKE 1 TABLET BY MOUTH EVERY SIX HOURS AS NEEDED FOR PAIN 08/25/16   [provider]  predniSONE (DELTASONE) 10 MG tablet Take 4 tabs a day for 3 days, then 3 a day  for 3 days ,then 2 a day for 3 days, then 1 a day for 3 days, then stop 12/22/20   [name], MD  promethazine (PHENERGAN) 25 MG tablet Reported on 10/30/2015 09/06/09   [provider]  traZODone (DESYREL) 100 MG tablet Take 50 mg by mouth at bedtime.    [provider]  triamcinolone cream (KENALOG) 0.1 % Apply 1 application topically 2 (two) times daily. 09/10/15   Nafziger, Tommi Rumps, NP  valACYclovir (VALTREX) 500 MG tablet Take 1 tablet (500 mg total) by mouth 2 (two) times daily. 06/05/20   [name], MD    Family History Family History  Problem Relation Age of Onset   Heart disease Other    Diabetes Other    Hyperlipidemia Other    Hypertension Sister    Breast cancer Sister    Heart disease Father    Diabetes Father    Heart attack Father        x 2   Prostate cancer Neg Hx    Kidney disease Neg Hx     Social History Social History   Tobacco Use   Smoking status: Former   Smokeless tobacco: Never   Tobacco comments:    quit 35 years  Substance Use Topics   Alcohol use: Yes    Alcohol/week: 0.0 standard drinks    Comment: occasional   Drug use: No     Allergies   Fire ant, Codeine, Cyclobenzaprine, Docusate sodium, Dulcolax stool softener [dss], Gabapentin, Ketorolac, Ketorolac tromethamine, Lisinopril, Methadone, and Pregabalin   Review of Systems Review of Systems  Constitutional:  Negative for chills and fever.  HENT:  Positive for hearing loss. Negative for ear discharge, ear pain and sore throat.   Respiratory:  Negative for cough and shortness of breath.   Cardiovascular:  Negative for chest pain and palpitations.  All other systems reviewed and are negative.   Physical Exam Triage Vital Signs ED Triage Vitals  Enc Vitals Group     BP      Pulse      Resp      Temp      Temp src      SpO2      Weight      Height      Head Circumference      Peak Flow      Pain Score      Pain Loc      Pain Edu?      Excl. in [place]?     No data found.  Updated Vital Signs BP 132/80   Pulse 64   Temp 98.9 F (37.2 C) (Oral)   Resp 18  SpO2 98%   Visual Acuity Right Eye Distance:   Left Eye Distance:   Bilateral Distance:    Right Eye Near:   Left Eye Near:    Bilateral Near:     Physical Exam Vitals and nursing note reviewed.  Constitutional:      General: He is not in acute distress.    Appearance: He is well-developed. He is not ill-appearing.  HENT:     Head: Normocephalic and atraumatic.     Right Ear: There is impacted cerumen.     Left Ear: Tympanic membrane and ear canal normal.     Nose: Nose normal.     Mouth/Throat:     Mouth: Mucous membranes are moist.     Pharynx: Oropharynx is clear.  Eyes:     Conjunctiva/sclera: Conjunctivae normal.  Cardiovascular:     Rate and Rhythm: Normal rate and regular rhythm.     Heart sounds: Normal heart sounds.  Pulmonary:     Effort: Pulmonary effort is normal. No respiratory distress.     Breath sounds: Normal breath sounds.  Abdominal:     Palpations: Abdomen is soft.     Tenderness: There is no abdominal tenderness.  Musculoskeletal:     Cervical back: Neck supple.  Skin:    General: Skin is warm and dry.  Neurological:     General: No focal deficit present.     Mental Status: He is alert and oriented to person, place, and time.  Psychiatric:        Mood and Affect: Mood normal.        Behavior: Behavior normal.     UC Treatments / Results  Labs (all labs ordered are listed, but only abnormal results are displayed) Labs Reviewed - No data to display  EKG   Radiology No results found.  Procedures Procedures (including critical care time)  Medications Ordered in UC Medications - No data to display  Initial Impression / Assessment and Plan / UC Course  I have reviewed the triage vital signs and the nursing notes.  Pertinent labs & imaging results that were available during my care of the patient were reviewed by me and  considered in my medical decision making (see chart for details).  Right ear impacted cerumen.  Cerumen removed via irrigation by RN.  Patient reports complete relief of his symptoms.  Education provided on earwax buildup and patient instructed to follow-up with his PCP as needed.  He agrees to plan of care.      Final Clinical Impressions(s) / UC Diagnoses   Final diagnoses:  Impacted cerumen of right ear     Discharge Instructions      See the attached information on earwax buildup.  Follow up with your primary care provider if your symptoms are not improving.         ED Prescriptions   None    PDMP not reviewed this encounter.   Sharion Balloon, NP 06/16/21 1335

## 2021-06-16 NOTE — Discharge Instructions (Addendum)
See the attached information on earwax buildup.  Follow up with your primary care provider if your symptoms are not improving.

## 2021-06-16 NOTE — ED Triage Notes (Signed)
Pt here with right ear muffled and ear fullness since this morning.

## 2021-06-23 NOTE — Telephone Encounter (Signed)
Spoke with pt advised to call [facility], provided pt with the office phone number

## 2021-06-26 DIAGNOSIS — G894 Chronic pain syndrome: Secondary | ICD-10-CM | POA: Diagnosis not present

## 2021-06-26 DIAGNOSIS — M4722 Other spondylosis with radiculopathy, cervical region: Secondary | ICD-10-CM | POA: Diagnosis not present

## 2021-06-26 DIAGNOSIS — Z6824 Body mass index (BMI) 24.0-24.9, adult: Secondary | ICD-10-CM | POA: Diagnosis not present

## 2021-06-26 DIAGNOSIS — M542 Cervicalgia: Secondary | ICD-10-CM | POA: Diagnosis not present

## 2021-07-01 ENCOUNTER — Other Ambulatory Visit: Payer: Self-pay | Admitting: Orthopaedic Surgery

## 2021-07-01 DIAGNOSIS — M47812 Spondylosis without myelopathy or radiculopathy, cervical region: Secondary | ICD-10-CM

## 2021-07-02 ENCOUNTER — Ambulatory Visit (INDEPENDENT_AMBULATORY_CARE_PROVIDER_SITE_OTHER): Payer: PPO | Admitting: Family Medicine

## 2021-07-02 ENCOUNTER — Encounter: Payer: Self-pay | Admitting: Family Medicine

## 2021-07-02 ENCOUNTER — Other Ambulatory Visit: Payer: Self-pay

## 2021-07-02 VITALS — BP 136/94 | HR 100 | Temp 98.1°F | Wt 167.0 lb

## 2021-07-02 DIAGNOSIS — M5412 Radiculopathy, cervical region: Secondary | ICD-10-CM

## 2021-07-02 MED ORDER — EPINEPHRINE 0.3 MG/0.3ML IJ SOAJ: INTRAMUSCULAR | 5 refills | Status: DC

## 2021-07-02 NOTE — Progress Notes (Signed)
   Subjective:    Patient ID: [name], male    DOB: October 20, 1960, 60 y.o.   MRN: [id number]  HPI Here for 2 weeks of increasing pain in the left side of his neck and this pain radiates down the left arm to the hand. He also has numbness and tingling in the left arm, and he has weakness in the left arm and hand. He saw Dr. Rennis Harding yesterday, and he ordered a non-contrasted MRI of the cervical spine. However he said he could not treat this because he is outside of [facility]' insurance network. [name] is taking his usual medications for pain. He says he has a Medrol dose pack at his pharmacy but he has not picked this up yet.    Review of Systems  Constitutional: Negative.   Respiratory: Negative.    Cardiovascular: Negative.   Musculoskeletal:  Positive for neck pain.  Neurological:  Positive for weakness and numbness.      Objective:   Physical Exam Constitutional:      Comments: In obvious pain. He is holding his left arm splinted against his body   Cardiovascular:     Rate and Rhythm: Normal rate and regular rhythm.     Pulses: Normal pulses.     Heart sounds: Normal heart sounds.  Pulmonary:     Effort: Pulmonary effort is normal.     Breath sounds: Normal breath sounds.  Musculoskeletal:     Comments: He is very tender over the lower posterior neck on the left side. He has very limited ROM of the neck due to pain  Neurological:     Mental Status: He is alert.          Assessment & Plan:  Left sided cervical radiculopathy. I advised him to pick up the Medrol dose pack from his pharmacy so he can start taking it. I instructed [name] to ask Dr. Patrice Paradise to cancel his MRI order. Instead I will order a cervical spine MRI with and without contrast (since he has had prior surgery). After that I anticipate putting in an urgent referral to [facility] to treat this. Alysia Penna, MD

## 2021-07-07 DIAGNOSIS — G5752 Tarsal tunnel syndrome, left lower limb: Secondary | ICD-10-CM | POA: Diagnosis not present

## 2021-07-07 DIAGNOSIS — G894 Chronic pain syndrome: Secondary | ICD-10-CM | POA: Diagnosis not present

## 2021-07-07 DIAGNOSIS — Z79891 Long term (current) use of opiate analgesic: Secondary | ICD-10-CM | POA: Diagnosis not present

## 2021-07-07 DIAGNOSIS — M5412 Radiculopathy, cervical region: Secondary | ICD-10-CM | POA: Diagnosis not present

## 2021-07-15 ENCOUNTER — Ambulatory Visit
Admission: RE | Admit: 2021-07-15 | Discharge: 2021-07-15 | Disposition: A | Discharge status: Home / Self Care | Payer: PPO | Source: Ambulatory Visit | Attending: Family Medicine | Admitting: Family Medicine

## 2021-07-15 DIAGNOSIS — M5412 Radiculopathy, cervical region: Secondary | ICD-10-CM

## 2021-07-15 DIAGNOSIS — M4692 Unspecified inflammatory spondylopathy, cervical region: Secondary | ICD-10-CM | POA: Diagnosis not present

## 2021-07-15 DIAGNOSIS — Z981 Arthrodesis status: Secondary | ICD-10-CM | POA: Diagnosis not present

## 2021-07-15 DIAGNOSIS — M4722 Other spondylosis with radiculopathy, cervical region: Secondary | ICD-10-CM | POA: Diagnosis not present

## 2021-07-15 MED ORDER — GADOBENATE DIMEGLUMINE 529 MG/ML IV SOLN
15.0000 mL | Freq: Once | INTRAVENOUS | Status: AC | PRN
  Administered 2021-07-15: 15 mL via INTRAVENOUS

## 2021-07-21 DIAGNOSIS — Z6825 Body mass index (BMI) 25.0-25.9, adult: Secondary | ICD-10-CM | POA: Diagnosis not present

## 2021-07-21 DIAGNOSIS — M25512 Pain in left shoulder: Secondary | ICD-10-CM | POA: Diagnosis not present

## 2021-07-21 DIAGNOSIS — I10 Essential (primary) hypertension: Secondary | ICD-10-CM | POA: Diagnosis not present

## 2021-07-22 ENCOUNTER — Other Ambulatory Visit: Payer: Self-pay | Admitting: Neurosurgery

## 2021-07-22 DIAGNOSIS — G5752 Tarsal tunnel syndrome, left lower limb: Secondary | ICD-10-CM | POA: Diagnosis not present

## 2021-07-22 DIAGNOSIS — Z79891 Long term (current) use of opiate analgesic: Secondary | ICD-10-CM | POA: Diagnosis not present

## 2021-07-22 DIAGNOSIS — G894 Chronic pain syndrome: Secondary | ICD-10-CM | POA: Diagnosis not present

## 2021-07-22 DIAGNOSIS — M5412 Radiculopathy, cervical region: Secondary | ICD-10-CM | POA: Diagnosis not present

## 2021-07-22 DIAGNOSIS — M25512 Pain in left shoulder: Secondary | ICD-10-CM

## 2021-07-23 DIAGNOSIS — F322 Major depressive disorder, single episode, severe without psychotic features: Secondary | ICD-10-CM | POA: Diagnosis not present

## 2021-08-11 ENCOUNTER — Other Ambulatory Visit: Payer: Self-pay

## 2021-08-11 ENCOUNTER — Ambulatory Visit
Admission: RE | Admit: 2021-08-11 | Discharge: 2021-08-11 | Disposition: A | Discharge status: Home / Self Care | Payer: PPO | Source: Ambulatory Visit | Attending: Neurosurgery | Admitting: Neurosurgery

## 2021-08-11 DIAGNOSIS — M25512 Pain in left shoulder: Secondary | ICD-10-CM

## 2021-08-11 DIAGNOSIS — S46012A Strain of muscle(s) and tendon(s) of the rotator cuff of left shoulder, initial encounter: Secondary | ICD-10-CM | POA: Diagnosis not present

## 2021-08-11 DIAGNOSIS — R531 Weakness: Secondary | ICD-10-CM | POA: Diagnosis not present

## 2021-08-11 DIAGNOSIS — R200 Anesthesia of skin: Secondary | ICD-10-CM | POA: Diagnosis not present

## 2021-08-19 DIAGNOSIS — G5752 Tarsal tunnel syndrome, left lower limb: Secondary | ICD-10-CM | POA: Diagnosis not present

## 2021-08-19 DIAGNOSIS — M25512 Pain in left shoulder: Secondary | ICD-10-CM | POA: Diagnosis not present

## 2021-08-19 DIAGNOSIS — G894 Chronic pain syndrome: Secondary | ICD-10-CM | POA: Diagnosis not present

## 2021-08-19 DIAGNOSIS — M5412 Radiculopathy, cervical region: Secondary | ICD-10-CM | POA: Diagnosis not present

## 2021-08-24 DIAGNOSIS — Z6825 Body mass index (BMI) 25.0-25.9, adult: Secondary | ICD-10-CM | POA: Diagnosis not present

## 2021-08-24 DIAGNOSIS — I10 Essential (primary) hypertension: Secondary | ICD-10-CM | POA: Diagnosis not present

## 2021-08-24 DIAGNOSIS — M25512 Pain in left shoulder: Secondary | ICD-10-CM | POA: Diagnosis not present

## 2021-08-27 ENCOUNTER — Telehealth: Payer: Self-pay | Admitting: Family Medicine

## 2021-08-27 NOTE — Telephone Encounter (Signed)
Spoke with patient to schedule AWV. He stated he had a lot going on and would like a call back 11/2021

## 2021-09-02 DIAGNOSIS — M67912 Unspecified disorder of synovium and tendon, left shoulder: Secondary | ICD-10-CM | POA: Diagnosis not present

## 2021-09-07 DIAGNOSIS — M25512 Pain in left shoulder: Secondary | ICD-10-CM | POA: Diagnosis not present

## 2021-09-07 DIAGNOSIS — M75111 Incomplete rotator cuff tear or rupture of right shoulder, not specified as traumatic: Secondary | ICD-10-CM | POA: Diagnosis not present

## 2021-09-09 DIAGNOSIS — M25512 Pain in left shoulder: Secondary | ICD-10-CM | POA: Diagnosis not present

## 2021-09-09 DIAGNOSIS — M75111 Incomplete rotator cuff tear or rupture of right shoulder, not specified as traumatic: Secondary | ICD-10-CM | POA: Diagnosis not present

## 2021-09-14 DIAGNOSIS — M25512 Pain in left shoulder: Secondary | ICD-10-CM | POA: Diagnosis not present

## 2021-09-14 DIAGNOSIS — M75111 Incomplete rotator cuff tear or rupture of right shoulder, not specified as traumatic: Secondary | ICD-10-CM | POA: Diagnosis not present

## 2021-09-16 DIAGNOSIS — M25512 Pain in left shoulder: Secondary | ICD-10-CM | POA: Diagnosis not present

## 2021-09-16 DIAGNOSIS — M75111 Incomplete rotator cuff tear or rupture of right shoulder, not specified as traumatic: Secondary | ICD-10-CM | POA: Diagnosis not present

## 2021-09-24 DIAGNOSIS — M75111 Incomplete rotator cuff tear or rupture of right shoulder, not specified as traumatic: Secondary | ICD-10-CM | POA: Diagnosis not present

## 2021-09-24 DIAGNOSIS — M25512 Pain in left shoulder: Secondary | ICD-10-CM | POA: Diagnosis not present

## 2021-09-30 DIAGNOSIS — M75112 Incomplete rotator cuff tear or rupture of left shoulder, not specified as traumatic: Secondary | ICD-10-CM | POA: Diagnosis not present

## 2021-10-02 DIAGNOSIS — M75111 Incomplete rotator cuff tear or rupture of right shoulder, not specified as traumatic: Secondary | ICD-10-CM | POA: Diagnosis not present

## 2021-10-02 DIAGNOSIS — M25512 Pain in left shoulder: Secondary | ICD-10-CM | POA: Diagnosis not present

## 2021-10-05 DIAGNOSIS — Z6825 Body mass index (BMI) 25.0-25.9, adult: Secondary | ICD-10-CM | POA: Diagnosis not present

## 2021-10-05 DIAGNOSIS — G609 Hereditary and idiopathic neuropathy, unspecified: Secondary | ICD-10-CM | POA: Diagnosis not present

## 2021-10-05 DIAGNOSIS — M25512 Pain in left shoulder: Secondary | ICD-10-CM | POA: Diagnosis not present

## 2021-10-07 DIAGNOSIS — M25512 Pain in left shoulder: Secondary | ICD-10-CM | POA: Diagnosis not present

## 2021-10-07 DIAGNOSIS — M75111 Incomplete rotator cuff tear or rupture of right shoulder, not specified as traumatic: Secondary | ICD-10-CM | POA: Diagnosis not present

## 2021-10-09 DIAGNOSIS — M25512 Pain in left shoulder: Secondary | ICD-10-CM | POA: Diagnosis not present

## 2021-10-09 DIAGNOSIS — M75111 Incomplete rotator cuff tear or rupture of right shoulder, not specified as traumatic: Secondary | ICD-10-CM | POA: Diagnosis not present

## 2021-10-12 DIAGNOSIS — M75111 Incomplete rotator cuff tear or rupture of right shoulder, not specified as traumatic: Secondary | ICD-10-CM | POA: Diagnosis not present

## 2021-10-12 DIAGNOSIS — M25512 Pain in left shoulder: Secondary | ICD-10-CM | POA: Diagnosis not present

## 2021-10-20 DIAGNOSIS — G894 Chronic pain syndrome: Secondary | ICD-10-CM | POA: Diagnosis not present

## 2021-10-20 DIAGNOSIS — G5752 Tarsal tunnel syndrome, left lower limb: Secondary | ICD-10-CM | POA: Diagnosis not present

## 2021-10-20 DIAGNOSIS — M25512 Pain in left shoulder: Secondary | ICD-10-CM | POA: Diagnosis not present

## 2021-10-20 DIAGNOSIS — M5412 Radiculopathy, cervical region: Secondary | ICD-10-CM | POA: Diagnosis not present

## 2021-10-21 DIAGNOSIS — M75111 Incomplete rotator cuff tear or rupture of right shoulder, not specified as traumatic: Secondary | ICD-10-CM | POA: Diagnosis not present

## 2021-10-21 DIAGNOSIS — M25512 Pain in left shoulder: Secondary | ICD-10-CM | POA: Diagnosis not present

## 2021-10-23 DIAGNOSIS — M75111 Incomplete rotator cuff tear or rupture of right shoulder, not specified as traumatic: Secondary | ICD-10-CM | POA: Diagnosis not present

## 2021-10-23 DIAGNOSIS — M25512 Pain in left shoulder: Secondary | ICD-10-CM | POA: Diagnosis not present

## 2021-10-28 DIAGNOSIS — M75111 Incomplete rotator cuff tear or rupture of right shoulder, not specified as traumatic: Secondary | ICD-10-CM | POA: Diagnosis not present

## 2021-10-28 DIAGNOSIS — M25512 Pain in left shoulder: Secondary | ICD-10-CM | POA: Diagnosis not present

## 2021-10-30 DIAGNOSIS — M25512 Pain in left shoulder: Secondary | ICD-10-CM | POA: Diagnosis not present

## 2021-10-30 DIAGNOSIS — M75111 Incomplete rotator cuff tear or rupture of right shoulder, not specified as traumatic: Secondary | ICD-10-CM | POA: Diagnosis not present

## 2021-11-04 DIAGNOSIS — M25512 Pain in left shoulder: Secondary | ICD-10-CM | POA: Diagnosis not present

## 2021-11-04 DIAGNOSIS — M75111 Incomplete rotator cuff tear or rupture of right shoulder, not specified as traumatic: Secondary | ICD-10-CM | POA: Diagnosis not present

## 2021-11-09 ENCOUNTER — Ambulatory Visit (INDEPENDENT_AMBULATORY_CARE_PROVIDER_SITE_OTHER): Payer: PPO | Admitting: Family Medicine

## 2021-11-09 ENCOUNTER — Encounter: Payer: Self-pay | Admitting: Family Medicine

## 2021-11-09 VITALS — BP 120/78 | HR 100 | Temp 98.6°F | Ht 69.0 in | Wt 168.4 lb

## 2021-11-09 DIAGNOSIS — Z23 Encounter for immunization: Secondary | ICD-10-CM | POA: Diagnosis not present

## 2021-11-09 DIAGNOSIS — Z0000 Encounter for general adult medical examination without abnormal findings: Secondary | ICD-10-CM | POA: Diagnosis not present

## 2021-11-09 LAB — CBC WITH DIFFERENTIAL/PLATELET
Basophils Absolute: 0.1 10*3/uL (ref 0.0–0.1)
Basophils Relative: 1.1 % (ref 0.0–3.0)
Eosinophils Absolute: 0.1 10*3/uL (ref 0.0–0.7)
Eosinophils Relative: 1.4 % (ref 0.0–5.0)
HCT: 51.8 % (ref 39.0–52.0)
Hemoglobin: 17.1 g/dL — ABNORMAL HIGH (ref 13.0–17.0)
Lymphocytes Relative: 17.2 % (ref 12.0–46.0)
Lymphs Abs: 1.5 10*3/uL (ref 0.7–4.0)
MCHC: 32.9 g/dL (ref 30.0–36.0)
MCV: 92.4 fl (ref 78.0–100.0)
Monocytes Absolute: 0.6 10*3/uL (ref 0.1–1.0)
Monocytes Relative: 7.1 % (ref 3.0–12.0)
Neutro Abs: 6.4 10*3/uL (ref 1.4–7.7)
Neutrophils Relative %: 73.2 % (ref 43.0–77.0)
Platelets: 201 10*3/uL (ref 150.0–400.0)
RBC: 5.61 Mil/uL (ref 4.22–5.81)
RDW: 13.1 % (ref 11.5–15.5)
WBC: 8.8 10*3/uL (ref 4.0–10.5)

## 2021-11-09 LAB — HEMOGLOBIN A1C: Hgb A1c MFr Bld: 6 % (ref 4.6–6.5)

## 2021-11-09 LAB — TSH: TSH: 1.77 u[IU]/mL (ref 0.35–5.50)

## 2021-11-09 LAB — PSA: PSA: 3.95 ng/mL (ref 0.10–4.00)

## 2021-11-09 MED ORDER — VALACYCLOVIR HCL 500 MG PO TABS: 500.0000 mg | ORAL_TABLET | Freq: Two times a day (BID) | ORAL | 3 refills | Status: DC

## 2021-11-09 NOTE — Progress Notes (Signed)
° °  Subjective:    Patient ID: [name], male    DOB: 1961-03-26, 61 y.o.   MRN: [id number]  HPI Here for a well exam. He feels well except his chronic pain syndromes.    Review of Systems  Constitutional: Negative.   HENT: Negative.    Eyes: Negative.   Respiratory: Negative.    Cardiovascular: Negative.   Gastrointestinal: Negative.   Genitourinary: Negative.   Musculoskeletal:  Positive for arthralgias and back pain.  Skin: Negative.   Neurological: Negative.   Psychiatric/Behavioral: Negative.        Objective:   Physical Exam Constitutional:      General: He is not in acute distress.    Appearance: Normal appearance. He is well-developed. He is not diaphoretic.  HENT:     Head: Normocephalic and atraumatic.     Right Ear: External ear normal.     Left Ear: External ear normal.     Nose: Nose normal.     Mouth/Throat:     Pharynx: No oropharyngeal exudate.  Eyes:     General: No scleral icterus.       Right eye: No discharge.        Left eye: No discharge.     Conjunctiva/sclera: Conjunctivae normal.     Pupils: Pupils are equal, round, and reactive to light.  Neck:     Thyroid: No thyromegaly.     Vascular: No JVD.     Trachea: No tracheal deviation.  Cardiovascular:     Rate and Rhythm: Normal rate and regular rhythm.     Heart sounds: Normal heart sounds. No murmur heard.   No friction rub. No gallop.  Pulmonary:     Effort: Pulmonary effort is normal. No respiratory distress.     Breath sounds: Normal breath sounds. No wheezing or rales.  Chest:     Chest wall: No tenderness.  Abdominal:     General: Bowel sounds are normal. There is no distension.     Palpations: Abdomen is soft. There is no mass.     Tenderness: There is no abdominal tenderness. There is no guarding or rebound.  Genitourinary:    Penis: Normal. No tenderness.      Testes: Normal.     Prostate: Normal.     Rectum: Normal. Guaiac result negative.  Musculoskeletal:         General: No tenderness. Normal range of motion.     Cervical back: Neck supple.  Lymphadenopathy:     Cervical: No cervical adenopathy.  Skin:    General: Skin is warm and dry.     Coloration: Skin is not pale.     Findings: No erythema or rash.  Neurological:     Mental Status: He is alert and oriented to person, place, and time.     Cranial Nerves: No cranial nerve deficit.     Motor: No abnormal muscle tone.     Coordination: Coordination normal.     Deep Tendon Reflexes: Reflexes are normal and symmetric. Reflexes normal.  Psychiatric:        Behavior: Behavior normal.        Thought Content: Thought content normal.        Judgment: Judgment normal.          Assessment & Plan:  Well exam. We discussed diet and exercise. Get fasting labs. Alysia Penna, MD

## 2021-11-10 LAB — HEPATIC FUNCTION PANEL
ALT: 65 U/L — ABNORMAL HIGH (ref 0–53)
AST: 44 U/L — ABNORMAL HIGH (ref 0–37)
Albumin: 5 g/dL (ref 3.5–5.2)
Alkaline Phosphatase: 68 U/L (ref 39–117)
Bilirubin, Direct: 0.2 mg/dL (ref 0.0–0.3)
Total Bilirubin: 0.6 mg/dL (ref 0.2–1.2)
Total Protein: 7.9 g/dL (ref 6.0–8.3)

## 2021-11-10 LAB — BASIC METABOLIC PANEL
BUN: 18 mg/dL (ref 6–23)
CO2: 27 mEq/L (ref 19–32)
Calcium: 9.9 mg/dL (ref 8.4–10.5)
Chloride: 98 mEq/L (ref 96–112)
Creatinine, Ser: 1.29 mg/dL (ref 0.40–1.50)
GFR: 60.7 mL/min (ref >60.00)
Glucose, Bld: 103 mg/dL — ABNORMAL HIGH (ref 70–99)
Potassium: 4.4 mEq/L (ref 3.5–5.1)
Sodium: 138 mEq/L (ref 135–145)

## 2021-11-10 LAB — LIPID PANEL
Cholesterol: 286 mg/dL — ABNORMAL HIGH (ref 0–200)
HDL: 59.9 mg/dL (ref >39.00)
LDL Cholesterol: 203 mg/dL — ABNORMAL HIGH (ref 0–99)
NonHDL: 225.78
Total CHOL/HDL Ratio: 5
Triglycerides: 112 mg/dL (ref 0.0–149.0)
VLDL: 22.4 mg/dL (ref 0.0–40.0)

## 2021-11-11 DIAGNOSIS — M75111 Incomplete rotator cuff tear or rupture of right shoulder, not specified as traumatic: Secondary | ICD-10-CM | POA: Diagnosis not present

## 2021-11-11 DIAGNOSIS — M25512 Pain in left shoulder: Secondary | ICD-10-CM | POA: Diagnosis not present

## 2021-11-12 ENCOUNTER — Telehealth: Payer: Self-pay | Admitting: Family Medicine

## 2021-11-12 ENCOUNTER — Other Ambulatory Visit: Payer: Self-pay

## 2021-11-12 MED ORDER — ROSUVASTATIN CALCIUM 10 MG PO TABS: 10.0000 mg | ORAL_TABLET | Freq: Every day | ORAL | 3 refills | Status: DC

## 2021-11-12 NOTE — Telephone Encounter (Signed)
Complete. See lab results

## 2021-11-12 NOTE — Telephone Encounter (Signed)
Patient called in to return [place] call regarding his lab results.  Please advise.

## 2021-11-13 ENCOUNTER — Encounter: Payer: Self-pay | Admitting: Family Medicine

## 2021-11-13 DIAGNOSIS — M25512 Pain in left shoulder: Secondary | ICD-10-CM | POA: Diagnosis not present

## 2021-11-13 DIAGNOSIS — M75111 Incomplete rotator cuff tear or rupture of right shoulder, not specified as traumatic: Secondary | ICD-10-CM | POA: Diagnosis not present

## 2021-11-13 MED ORDER — ACYCLOVIR 800 MG PO TABS: 800.0000 mg | ORAL_TABLET | Freq: Two times a day (BID) | ORAL | 3 refills | Status: DC

## 2021-11-13 NOTE — Telephone Encounter (Signed)
I stopped the Valacyclovir and sent in Acyclovir

## 2021-11-18 DIAGNOSIS — M25512 Pain in left shoulder: Secondary | ICD-10-CM | POA: Diagnosis not present

## 2021-11-18 DIAGNOSIS — M75111 Incomplete rotator cuff tear or rupture of right shoulder, not specified as traumatic: Secondary | ICD-10-CM | POA: Diagnosis not present

## 2021-11-27 DIAGNOSIS — M25512 Pain in left shoulder: Secondary | ICD-10-CM | POA: Diagnosis not present

## 2021-11-27 DIAGNOSIS — M75111 Incomplete rotator cuff tear or rupture of right shoulder, not specified as traumatic: Secondary | ICD-10-CM | POA: Diagnosis not present

## 2021-12-03 DIAGNOSIS — M75111 Incomplete rotator cuff tear or rupture of right shoulder, not specified as traumatic: Secondary | ICD-10-CM | POA: Diagnosis not present

## 2021-12-03 DIAGNOSIS — M25512 Pain in left shoulder: Secondary | ICD-10-CM | POA: Diagnosis not present

## 2021-12-08 DIAGNOSIS — M75111 Incomplete rotator cuff tear or rupture of right shoulder, not specified as traumatic: Secondary | ICD-10-CM | POA: Diagnosis not present

## 2021-12-08 DIAGNOSIS — M25512 Pain in left shoulder: Secondary | ICD-10-CM | POA: Diagnosis not present

## 2021-12-10 DIAGNOSIS — M75111 Incomplete rotator cuff tear or rupture of right shoulder, not specified as traumatic: Secondary | ICD-10-CM | POA: Diagnosis not present

## 2021-12-10 DIAGNOSIS — M25512 Pain in left shoulder: Secondary | ICD-10-CM | POA: Diagnosis not present

## 2021-12-15 DIAGNOSIS — M25512 Pain in left shoulder: Secondary | ICD-10-CM | POA: Diagnosis not present

## 2021-12-15 DIAGNOSIS — G5752 Tarsal tunnel syndrome, left lower limb: Secondary | ICD-10-CM | POA: Diagnosis not present

## 2021-12-15 DIAGNOSIS — G894 Chronic pain syndrome: Secondary | ICD-10-CM | POA: Diagnosis not present

## 2021-12-15 DIAGNOSIS — M5412 Radiculopathy, cervical region: Secondary | ICD-10-CM | POA: Diagnosis not present

## 2021-12-18 DIAGNOSIS — M25512 Pain in left shoulder: Secondary | ICD-10-CM | POA: Diagnosis not present

## 2021-12-18 DIAGNOSIS — M75111 Incomplete rotator cuff tear or rupture of right shoulder, not specified as traumatic: Secondary | ICD-10-CM | POA: Diagnosis not present

## 2021-12-25 DIAGNOSIS — M25512 Pain in left shoulder: Secondary | ICD-10-CM | POA: Diagnosis not present

## 2021-12-25 DIAGNOSIS — M75111 Incomplete rotator cuff tear or rupture of right shoulder, not specified as traumatic: Secondary | ICD-10-CM | POA: Diagnosis not present

## 2021-12-30 DIAGNOSIS — M5412 Radiculopathy, cervical region: Secondary | ICD-10-CM | POA: Diagnosis not present

## 2021-12-30 DIAGNOSIS — M799 Soft tissue disorder, unspecified: Secondary | ICD-10-CM | POA: Diagnosis not present

## 2021-12-30 DIAGNOSIS — M5416 Radiculopathy, lumbar region: Secondary | ICD-10-CM | POA: Diagnosis not present

## 2021-12-30 DIAGNOSIS — M6281 Muscle weakness (generalized): Secondary | ICD-10-CM | POA: Diagnosis not present

## 2021-12-31 ENCOUNTER — Ambulatory Visit (INDEPENDENT_AMBULATORY_CARE_PROVIDER_SITE_OTHER): Payer: PPO

## 2021-12-31 VITALS — Ht 69.0 in | Wt 168.0 lb

## 2021-12-31 DIAGNOSIS — Z0000 Encounter for general adult medical examination without abnormal findings: Secondary | ICD-10-CM

## 2021-12-31 NOTE — Patient Instructions (Addendum)
?Mr. [name] , ?Thank you for taking time to come for your Medicare Wellness Visit. I appreciate your ongoing commitment to your health goals. Please review the following plan we discussed and let me know if I can assist you in the future.  ? ?These are the goals we discussed: ? Goals   ? ?   Exercise 150 min/wk Moderate Activity   ?   Increase physical activity (pt-stated)   ?   I would like to exercise more.  ?  ? ?  ?  ?This is a list of the screening recommended for you and due dates:  ?Health Maintenance  ?Topic Date Due  ? COVID-19 Vaccine (4 - Booster for Pfizer series) 01/16/2022*  ? HIV Screening  01/01/2023*  ? Flu Shot  04/27/2022  ? Colon Cancer Screening  10/27/2030  ? Tetanus Vaccine  11/10/2031  ? Hepatitis C Screening: USPSTF Recommendation to screen - Ages 38-79 yo.  Completed  ? Zoster (Shingles) Vaccine  Completed  ? HPV Vaccine  Aged Out  ?*Topic was postponed. The date shown is not the original due date.  ? ?Opioid Pain Medicine Management ?Opioids are powerful medicines that are used to treat moderate to severe pain. When used for short periods of time, they can help you to: ?Sleep better. ?Do better in physical or occupational therapy. ?Feel better in the first few days after an injury. ?Recover from surgery. ?Opioids should be taken with the supervision of a trained health care provider. They should be taken for the shortest period of time possible. This is because opioids can be addictive, and the longer you take opioids, the greater your risk of addiction. This addiction can also be called opioid use disorder. ?What are the risks? ?Using opioid pain medicines for longer than 3 days increases your risk of side effects. Side effects include: ?Constipation. ?Nausea and vomiting. ?Breathing difficulties (respiratory depression). ?Drowsiness. ?Confusion. ?Opioid use disorder. ?Itching. ?Taking opioid pain medicine for a long period of time can affect your ability to do daily tasks. It also puts  you at risk for: ?Motor vehicle crashes. ?Depression. ?Suicide. ?Heart attack. ?Overdose, which can be life-threatening. ?What is a pain treatment plan? ?A pain treatment plan is an agreement between you and your health care provider. Pain is unique to each person, and treatments vary depending on your condition. To manage your pain, you and your health care provider need to work together. To help you do this: ?Discuss the goals of your treatment, including how much pain you might expect to have and how you will manage the pain. ?Review the risks and benefits of taking opioid medicines. ?Remember that a good treatment plan uses more than one approach and minimizes the chance of side effects. ?Be honest about the amount of medicines you take and about any drug or alcohol use. ?Get pain medicine prescriptions from only one health care provider. ?Pain can be managed with many types of alternative treatments. Ask your health care provider to refer you to one or more specialists who can help you manage pain through: ?Physical or occupational therapy. ?Counseling (cognitive behavioral therapy). ?Good nutrition. ?Biofeedback. ?Massage. ?Meditation. ?Non-opioid medicine. ?Following a gentle exercise program. ?How to use opioid pain medicine ?Taking medicine ?Take your pain medicine exactly as told by your health care provider. Take it only when you need it. ?If your pain gets less severe, you may take less than your prescribed dose if your health care provider approves. ?If you are not having pain, do  nottake pain medicine unless your health care provider tells you to take it. ?If your pain is severe, do nottry to treat it yourself by taking more pills than instructed on your prescription. Contact your health care provider for help. ?Write down the times when you take your pain medicine. It is easy to become confused while on pain medicine. Writing the time can help you avoid overdose. ?Take other over-the-counter or  prescription medicines only as told by your health care provider. ?Keeping yourself and others safe ? ?While you are taking opioid pain medicine: ?Do not drive, use machinery, or power tools. ?Do not sign legal documents. ?Do not drink alcohol. ?Do not take sleeping pills. ?Do not supervise children by yourself. ?Do not do activities that require climbing or being in high places. ?Do not go to a lake, river, ocean, spa, or swimming pool. ?Do not share your pain medicine with anyone. ?Keep pain medicine in a locked cabinet or in a secure area where pets and children cannot reach it. ?Stopping your use of opioids ?If you have been taking opioid medicine for more than a few weeks, you may need to slowly decrease (taper) how much you take until you stop completely. Tapering your use of opioids can decrease your risk of symptoms of withdrawal, such as: ?Pain and cramping in the abdomen. ?Nausea. ?Sweating. ?Sleepiness. ?Restlessness. ?Uncontrollable shaking (tremors). ?Cravings for the medicine. ?Do not attempt to taper your use of opioids on your own. Talk with your health care provider about how to do this. Your health care provider may prescribe a step-down schedule based on how much medicine you are taking and how long you have been taking it. ?Getting rid of leftover pills ?Do not save any leftover pills. Get rid of leftover pills safely by: ?Taking the medicine to a prescription take-back program. This is usually offered by the county or law enforcement. ?Bringing them to a pharmacy that has a drug disposal container. ?Flushing them down the toilet. Check the label or package insert of your medicine to see whether this is safe to do. ?Throwing them out in the trash. Check the label or package insert of your medicine to see whether this is safe to do. ?If it is safe to throw it out, remove the medicine from the original container, put it into a sealable bag or container, and mix it with used coffee grounds, food  scraps, dirt, or cat litter before putting it in the trash. ?Follow these instructions at home: ?Activity ?Do exercises as told by your health care provider. ?Avoid activities that make your pain worse. ?Return to your normal activities as told by your health care provider. Ask your health care provider what activities are safe for you. ?General instructions ?You may need to take these actions to prevent or treat constipation: ?Drink enough fluid to keep your urine pale yellow. ?Take over-the-counter or prescription medicines. ?Eat foods that are high in fiber, such as beans, whole grains, and fresh fruits and vegetables. ?Limit foods that are high in fat and processed sugars, such as fried or sweet foods. ?Keep all follow-up visits. This is important. ?Where to find support ?If you have been taking opioids for a long time, you may benefit from receiving support for quitting from a local support group or counselor. Ask your health care provider for a referral to these resources in your area. ?Where to find more information ?Centers for Disease Control and Prevention (CDC): http://www.wolf.info/ ?U.S. Food and Drug Administration (FDA): [place].ch ?Get  help right away if: ?You may have taken too much of an opioid (overdosed). Common symptoms of an overdose: ?Your breathing is slower or more shallow than normal. ?You have a very slow heartbeat (pulse). ?You have slurred speech. ?You have nausea and vomiting. ?Your pupils become very small. ?You have other potential symptoms: ?You are very confused. ?You faint or feel like you will faint. ?You have cold, clammy skin. ?You have blue lips or fingernails. ?You have thoughts of harming yourself or harming others. ?These symptoms may represent a serious problem that is an emergency. Do not wait to see if the symptoms will go away. Get medical help right away. Call your local emergency services (911 in the [place] Do not drive yourself to the hospital.  ?If you ever feel like you may  hurt yourself or others, or have thoughts about taking your own life, get help right away. Go to your nearest emergency department or: ?Call your local emergency services (911 in the [place] ?[facility]

## 2021-12-31 NOTE — Progress Notes (Addendum)
? ?Subjective:  ? [name] is a 61 y.o. male who presents for Medicare Annual/Subsequent preventive examination. ? ?Review of Systems    ?Virtual Visit via Telephone Note ? ?I connected with  [name] on 01/04/22 at  1:45 PM EDT by telephone and verified that I am speaking with the correct person using two identifiers. ? ?Location: ?Patient: Home ?Provider: Office ?Persons participating in the virtual visit: patient/Nurse Health Advisor ?  ?I discussed the limitations, risks, security and privacy concerns of performing an evaluation and management service by telephone and the availability of in person appointments. The patient expressed understanding and agreed to proceed. ? ?Interactive audio and video telecommunications were attempted between this nurse and patient, however failed, due to patient having technical difficulties OR patient did not have access to video capability.  We continued and completed visit with audio only. ? ?Some vital signs may be absent or patient reported.  ? ?Criselda Peaches, LPN  ?Cardiac Risk Factors include: advanced age (>53mn, >>33women);hypertension;male gender ? ?   ?Objective:  ?  ?Today's Vitals  ? 12/31/21 1346 12/31/21 1349  ?Weight: 168 lb (76.2 kg)   ?Height: '5\' 9"'$  (1.753 m)   ?PainSc:  3   ? ?Body mass index is 24.81 kg/m?. ? ? ?  12/31/2021  ?  2:08 PM 09/16/2020  ?  3:58 PM  ?Advanced Directives  ?Does Patient Have a Medical Advance Directive? Yes Yes  ?Type of [place] will  ?Does patient want to make changes to medical advance directive? No - Patient declined No - Patient declined  ?Copy of [place] Chart? No - copy requested No - copy requested  ? ? ?Current Medications (verified) ?Outpatient Encounter Medications as of 12/31/2021  ?Medication Sig  ? acyclovir (ZOVIRAX) 800 MG tablet Take 1 tablet (800 mg total) by mouth 2 (two) times  daily.  ? amLODipine (NORVASC) 10 MG tablet TAKE ONE TABLET EVERY DAY  ? cetirizine (ZYRTEC) 10 MG tablet Take 10 mg by mouth daily.  ? diazepam (VALIUM) 10 MG tablet Take 10 mg by mouth every 6 (six) hours as needed.  ? diclofenac sodium (VOLTAREN) 1 % GEL Apply 1 application topically as needed.  ? EPINEPHrine 0.3 mg/0.3 mL IJ SOAJ injection USE AS DIRECTED  ? fentaNYL (DURAGESIC - DOSED MCG/HR) 50 MCG/HR Place 50 mcg onto the skin every other day.  ? fluticasone (FLONASE) 50 MCG/ACT nasal spray USE TWO SPRAYS IN EACH NOSTRIL DAILY  ? hydrocortisone-pramoxine (PROCTOFOAM HC) rectal foam USE AS DIRECTED 3 TIMES A DAY AS NEEDED  ? hydrocortisone-pramoxine (PROCTOFOAM-HC) rectal foam Place 1 applicator rectally 2 (two) times daily.  ? ketoconazole (NIZORAL) 200 MG tablet Take 1 tablet (200 mg total) by mouth daily.  ? lactulose (CHRONULAC) 10 GM/15ML solution   ? oxyCODONE (OXY IR/ROXICODONE) 5 MG immediate release tablet TAKE 1 TABLET BY MOUTH EVERY SIX HOURS AS NEEDED FOR PAIN  ? promethazine (PHENERGAN) 25 MG tablet Reported on 10/30/2015  ? rosuvastatin (CRESTOR) 10 MG tablet Take 1 tablet (10 mg total) by mouth daily.  ? traZODone (DESYREL) 100 MG tablet Take 50 mg by mouth at bedtime.  ? triamcinolone cream (KENALOG) 0.1 % Apply 1 application topically 2 (two) times daily.  ? ?No facility-administered encounter medications on file as of 12/31/2021.  ? ? ?Allergies (verified) ?Fire ant, Codeine, Cyclobenzaprine, Docusate sodium, Dulcolax stool softener [dss], Gabapentin, Ketorolac, Ketorolac tromethamine, Lisinopril, Methadone,  and Pregabalin  ? ?History: ?Past Medical History:  ?Diagnosis Date  ? ADHD (attention deficit hyperactivity disorder), inattentive type   ? sees Dr. Celesta Aver in [place], [place]   ? Anxiety   ? sees Dr. Celesta Aver in [place], [place]   ? Arthritis   ? Benign enlargement of prostate   ? sees [facility]   ? Carpal tunnel syndrome on left   ? [facility]. Eugene [facility] at [facility]   ? Chronic  pain   ? sees Dr. Nicholaus Bloom (meds) and Dorathy Kinsman (accupuncture)   ? Depression   ? sees Dr. Celesta Aver  ? Functional impotence   ? Hypogonadism in male   ? sees Larene Beach (a [place]) at [facility]   ? Insomnia   ? Kidney stones   ? Sinusitis, acute 07/17/2016  ? Urticaria due to cold   ? ?Past Surgical History:  ?Procedure Laterality Date  ? APPENDECTOMY    ? CERVICAL FUSION  2008  ? C6-7 per Dr. Rennis Harding  ? COLONOSCOPY  11/06/2009  ? per Dr. Teena Irani, no polyps, repeat in 10 yrs   ? FOOT SURGERY Right   ? x 2  ? LUMBAR DISC SURGERY    ? L5-S1, C 6-6  ? ?Family History  ?Problem Relation Age of Onset  ? Heart disease Other   ? Diabetes Other   ? Hyperlipidemia Other   ? Hypertension Sister   ? Breast cancer Sister   ? Heart disease Father   ? Diabetes Father   ? Heart attack Father   ?     x 2  ? Prostate cancer Neg Hx   ? Kidney disease Neg Hx   ? ?Social History  ? ?Socioeconomic History  ? Marital status: Married  ?  Spouse name: Not on file  ? Number of children: 0  ? Years of education: Not on file  ? Highest education level: Associate degree: occupational, Hotel manager, or vocational program  ?Occupational History  ?  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 Smoking status: Former  ? Smokeless tobacco: Never  ? Tobacco comments:  ?  quit 35 years  ?Substance and Sexual Activity  ? Alcohol use: Yes  ?  Alcohol/week: 0.0 standard drinks  ?  Comment: occasional  ? Drug use: No  ? Sexual activity: Yes  ?  Birth control/protection: Condom  ?Other Topics Concern  ? Not on file  ?Social History Narrative  ? Lives with wife  ? Caffeine- coffee 1-2 cups, rarely a soda  ? ?Social Determinants of Health  ? ?Financial Resource Strain: Low Risk   ? Difficulty of Paying Living Expenses: Not hard at all  ?Food Insecurity: No Food Insecurity  ? Worried About Charity fundraiser in the Last Year: Never true  ? Ran Out of Food in the Last Year: Never true  ?Transportation Needs: No  Transportation Needs  ? Lack of Transportation (Medical): No  ? Lack of Transportation (Non-Medical): No  ?Physical Activity: Insufficiently Active  ? Days of Exercise per Week: 2 days  ? Minutes of Exercise per Session: 60 min  ?Stress: No Stress Concern Present  ? Feeling of Stress : Only a little  ?Social Connections: Socially Integrated  ? Frequency of Communication with Friends and Family: More than three times a week  ? Frequency of Social Gatherings with Friends and Family: More than three times a week  ? Attends Religious Services: More than 4 times per  year  ? Active Member of Clubs or Organizations: Yes  ? Attends Archivist Meetings: More than 4 times per year  ? Marital Status: Married  ? ? ? ?Clinical Intake: ? ? ?Diabetic?  No ? ? ?Activities of Daily Living ? ?  12/31/2021  ?  2:04 PM  ?In your present state of health, do you have any difficulty performing the following activities:  ?Hearing? 0  ?Vision? 0  ?Difficulty concentrating or making decisions? 0  ?Walking or climbing stairs? 0  ?Dressing or bathing? 0  ?Doing errands, shopping? 0  ?Preparing Food and eating ? N  ?Using the Toilet? N  ?In the past six months, have you accidently leaked urine? N  ?Do you have problems with loss of bowel control? N  ?Managing your Medications? N  ?Managing your Finances? N  ?Housekeeping or managing your Housekeeping? N  ? ? ?Patient Care Team: ?[name], MD as PCP - General (Family Medicine) ?[place], Anne Ng, [facility] (Pharmacist) ? ?Indicate any recent Medical Services you may have received from other than [facility] providers in the past year (date may be approximate). ? ?   ?Assessment:  ? This is a routine wellness examination for [place]. ? ?Hearing/Vision screen ?Hearing Screening - Comments:: No hearing difficulty ?Vision Screening - Comments:: Wears glasses. Followed by [facility] ? ?Dietary issues and exercise activities discussed: ?Exercise limited by: None identified ? ? Goals  Addressed   ? ?  ?  ?  ?  ?  ? This Visit's Progress  ?   Increase physical activity (pt-stated)     ?   I would like to exercise more.  ?  ? ?  ? ?Depression Screen ? ?  12/31/2021  ?  2:01 PM 11/09/2021  ?  2:46 PM 12/21/

## 2022-01-01 DIAGNOSIS — M25512 Pain in left shoulder: Secondary | ICD-10-CM | POA: Diagnosis not present

## 2022-01-01 DIAGNOSIS — M75111 Incomplete rotator cuff tear or rupture of right shoulder, not specified as traumatic: Secondary | ICD-10-CM | POA: Diagnosis not present

## 2022-01-04 DIAGNOSIS — M5412 Radiculopathy, cervical region: Secondary | ICD-10-CM | POA: Diagnosis not present

## 2022-01-04 DIAGNOSIS — M5416 Radiculopathy, lumbar region: Secondary | ICD-10-CM | POA: Diagnosis not present

## 2022-01-04 DIAGNOSIS — M6281 Muscle weakness (generalized): Secondary | ICD-10-CM | POA: Diagnosis not present

## 2022-01-04 DIAGNOSIS — M799 Soft tissue disorder, unspecified: Secondary | ICD-10-CM | POA: Diagnosis not present

## 2022-01-08 DIAGNOSIS — M6281 Muscle weakness (generalized): Secondary | ICD-10-CM | POA: Diagnosis not present

## 2022-01-08 DIAGNOSIS — M5416 Radiculopathy, lumbar region: Secondary | ICD-10-CM | POA: Diagnosis not present

## 2022-01-08 DIAGNOSIS — M5412 Radiculopathy, cervical region: Secondary | ICD-10-CM | POA: Diagnosis not present

## 2022-01-08 DIAGNOSIS — M799 Soft tissue disorder, unspecified: Secondary | ICD-10-CM | POA: Diagnosis not present

## 2022-01-11 NOTE — Addendum Note (Signed)
Addended by: Criselda Peaches on: 01/11/2022 04:37 PM ? ? Modules accepted: Orders ? ?

## 2022-01-12 DIAGNOSIS — M799 Soft tissue disorder, unspecified: Secondary | ICD-10-CM | POA: Diagnosis not present

## 2022-01-12 DIAGNOSIS — M5416 Radiculopathy, lumbar region: Secondary | ICD-10-CM | POA: Diagnosis not present

## 2022-01-12 DIAGNOSIS — M6281 Muscle weakness (generalized): Secondary | ICD-10-CM | POA: Diagnosis not present

## 2022-01-12 DIAGNOSIS — M5412 Radiculopathy, cervical region: Secondary | ICD-10-CM | POA: Diagnosis not present

## 2022-01-12 NOTE — Addendum Note (Signed)
Addended by: Criselda Peaches on: 01/12/2022 08:40 AM ? ? Modules accepted: Orders ? ?

## 2022-01-14 DIAGNOSIS — F322 Major depressive disorder, single episode, severe without psychotic features: Secondary | ICD-10-CM | POA: Diagnosis not present

## 2022-02-09 DIAGNOSIS — M5412 Radiculopathy, cervical region: Secondary | ICD-10-CM | POA: Diagnosis not present

## 2022-02-09 DIAGNOSIS — G894 Chronic pain syndrome: Secondary | ICD-10-CM | POA: Diagnosis not present

## 2022-02-09 DIAGNOSIS — G5752 Tarsal tunnel syndrome, left lower limb: Secondary | ICD-10-CM | POA: Diagnosis not present

## 2022-02-09 DIAGNOSIS — M25512 Pain in left shoulder: Secondary | ICD-10-CM | POA: Diagnosis not present

## 2022-04-06 DIAGNOSIS — M25512 Pain in left shoulder: Secondary | ICD-10-CM | POA: Diagnosis not present

## 2022-04-06 DIAGNOSIS — G5752 Tarsal tunnel syndrome, left lower limb: Secondary | ICD-10-CM | POA: Diagnosis not present

## 2022-04-06 DIAGNOSIS — Z79891 Long term (current) use of opiate analgesic: Secondary | ICD-10-CM | POA: Diagnosis not present

## 2022-04-06 DIAGNOSIS — G894 Chronic pain syndrome: Secondary | ICD-10-CM | POA: Diagnosis not present

## 2022-04-06 DIAGNOSIS — M5412 Radiculopathy, cervical region: Secondary | ICD-10-CM | POA: Diagnosis not present

## 2022-04-10 ENCOUNTER — Other Ambulatory Visit: Payer: Self-pay | Admitting: Family Medicine

## 2022-06-03 DIAGNOSIS — F322 Major depressive disorder, single episode, severe without psychotic features: Secondary | ICD-10-CM | POA: Diagnosis not present

## 2022-06-08 DIAGNOSIS — G894 Chronic pain syndrome: Secondary | ICD-10-CM | POA: Diagnosis not present

## 2022-06-08 DIAGNOSIS — M5412 Radiculopathy, cervical region: Secondary | ICD-10-CM | POA: Diagnosis not present

## 2022-06-08 DIAGNOSIS — G5752 Tarsal tunnel syndrome, left lower limb: Secondary | ICD-10-CM | POA: Diagnosis not present

## 2022-06-08 DIAGNOSIS — M25512 Pain in left shoulder: Secondary | ICD-10-CM | POA: Diagnosis not present

## 2022-08-03 DIAGNOSIS — G5752 Tarsal tunnel syndrome, left lower limb: Secondary | ICD-10-CM | POA: Diagnosis not present

## 2022-08-03 DIAGNOSIS — M25512 Pain in left shoulder: Secondary | ICD-10-CM | POA: Diagnosis not present

## 2022-08-03 DIAGNOSIS — G894 Chronic pain syndrome: Secondary | ICD-10-CM | POA: Diagnosis not present

## 2022-08-03 DIAGNOSIS — M5412 Radiculopathy, cervical region: Secondary | ICD-10-CM | POA: Diagnosis not present

## 2022-08-20 ENCOUNTER — Ambulatory Visit
Admission: EM | Admit: 2022-08-20 | Discharge: 2022-08-20 | Disposition: A | Discharge status: Home / Self Care | Payer: PPO

## 2022-08-20 DIAGNOSIS — B9689 Other specified bacterial agents as the cause of diseases classified elsewhere: Secondary | ICD-10-CM

## 2022-08-20 DIAGNOSIS — J019 Acute sinusitis, unspecified: Secondary | ICD-10-CM

## 2022-08-20 MED ORDER — PREDNISONE 20 MG PO TABS
40.0000 mg | ORAL_TABLET | Freq: Every day | ORAL | 0 refills | Status: AC
Start: 2022-08-20 — End: 2022-08-25

## 2022-08-20 MED ORDER — AMOXICILLIN-POT CLAVULANATE 875-125 MG PO TABS: 1.0000 | ORAL_TABLET | Freq: Two times a day (BID) | ORAL | 0 refills | Status: DC

## 2022-08-20 MED ORDER — HYDROCOD POLI-CHLORPHE POLI ER 10-8 MG/5ML PO SUER
5.0000 mL | Freq: Two times a day (BID) | ORAL | 0 refills | Status: AC | PRN
Start: 2022-08-20 — End: 2022-08-27

## 2022-08-20 NOTE — ED Triage Notes (Signed)
Pt. presents to UC w/ c/o a productive cough that produces yellow sputum, sore throat, and increased nasal drainage for the past 8 days.

## 2022-08-20 NOTE — ED Provider Notes (Signed)
[name]    CSN: [id number] Arrival date & time: 08/20/22  1307      History   Chief Complaint Chief Complaint  Patient presents with   Sore Throat   Cough   Nasal Congestion    HPI [name] is a 61 y.o. male.    Sore Throat  Cough   Presents to UC with complaint of productive cough with yellow sputum, sore throat, increased nasal drainage.  Symptoms x 8 days.  He endorses acutely sore throat overnight last night with inability to swallow.  Sputum production from his cough and nose is copious.  Causes him to wake in the night choking.  Denies fever, chills, myalgias.  Past Medical History:  Diagnosis Date   ADHD (attention deficit hyperactivity disorder), inattentive type    sees Dr. Celesta Aver in [place], [place]    sees Dr. Celesta Aver in [place], [place]    Arthritis    Benign enlargement of prostate    sees [facility]    Carpal tunnel syndrome on left    sees Dr. Eugene Garnet at [place] [facility]    sees Dr. Nicholaus Bloom (meds) and Dorathy Kinsman (accupuncture)    Depression    sees Dr. Celesta Aver   Functional impotence    Hypogonadism in male    sees Larene Beach (a [place]) at [facility]    Insomnia    Kidney stones    Sinusitis, acute 07/17/2016   Urticaria due to cold     Patient Active Problem List   Diagnosis Date Noted   COVID-19 virus infection 03/25/2021   Tinea versicolor 11/08/2016   Morton neuroma, right 11/02/2016   Sinusitis, acute 07/17/2016   BPH with obstruction/lower urinary tract symptoms 05/28/2015   Hypogonadism in male 05/28/2015   Kidney stones 05/28/2015   Elevated blood sugar 12/02/2014   Hyperlipidemia, mixed 07/09/2014   Atypical chest pain 06/24/2014   Cough secondary to angiotensin converting enzyme inhibitor (ACE-I) 10/31/2013   Essential hypertension, benign 10/25/2013   History of hepatitis C 01/01/2013   PITUITARY INSUFFICIENCY 06/13/2009   INSOMNIA 02/28/2009    CHRONIC PAIN SYNDROME 08/28/2007   Allergic rhinitis 08/28/2007   ANGIONEUROTIC EDEMA NOT ELSEWHERE CLASSIFIED 08/28/2007   DERMATITIS, CONTACT, NOS 05/03/2007    Past Surgical History:  Procedure Laterality Date   APPENDECTOMY     CERVICAL FUSION  2008   C6-7 per Dr. Rennis Harding   COLONOSCOPY  11/06/2009   per Dr. Teena Irani, no polyps, repeat in 10 yrs    FOOT SURGERY Right    x 2   LUMBAR DISC SURGERY     L5-S1, C 6-6       Home Medications    Prior to Admission medications   Medication Sig Start Date End Date Taking? Authorizing Provider  escitalopram (LEXAPRO) 5 MG tablet Take 5 mg by mouth daily. 05/17/22  Yes [provider]  PARoxetine (PAXIL) 20 MG tablet Take 20 mg by mouth every morning. 05/10/22  Yes [provider]  risperiDONE (RISPERDAL) 0.5 MG tablet Take by mouth. 06/04/22  Yes [provider]  acyclovir (ZOVIRAX) 800 MG tablet Take 1 tablet (800 mg total) by mouth 2 (two) times daily. 11/13/21   [name], MD  amLODipine (NORVASC) 10 MG tablet TAKE 1 TABLET BY MOUTH DAILY 04/13/22   [name], MD  cetirizine (ZYRTEC) 10 MG tablet Take 10 mg by mouth daily.    [provider]  diazepam (VALIUM) 10 MG tablet Take 10 mg by mouth every 6 (six) hours as needed.    [provider]  diclofenac sodium (VOLTAREN) 1 % GEL Apply 1 application topically as needed.    [provider]  EPINEPHrine 0.3 mg/0.3 mL IJ SOAJ injection USE AS DIRECTED 07/02/21   [name], MD  fentaNYL (DURAGESIC - DOSED MCG/HR) 50 MCG/HR Place 50 mcg onto the skin every other day.    [provider]  fluticasone Asencion Islam) 50 MCG/ACT nasal spray USE TWO SPRAYS IN [facility] NOSTRIL DAILY 05/06/20   [name], MD  hydrocortisone-pramoxine (PROCTOFOAM [facility]) rectal foam USE AS DIRECTED 3 TIMES A DAY AS NEEDED 10/16/19   [name], MD  hydrocortisone-pramoxine [facility]) rectal foam Place 1 applicator rectally 2 (two) times  daily.    [provider]  ketoconazole (NIZORAL) 200 MG tablet Take 1 tablet (200 mg total) by mouth daily. 11/08/16   [name], MD  lactulose [facility]) 10 GM/15ML solution  12/08/07   [provider]  oxyCODONE (OXY IR/ROXICODONE) 5 MG immediate release tablet TAKE 1 TABLET BY MOUTH EVERY SIX HOURS AS NEEDED FOR PAIN 08/25/16   [provider]  promethazine (PHENERGAN) 25 MG tablet Reported on 10/30/2015 09/06/09   [provider]  rosuvastatin (CRESTOR) 10 MG tablet Take 1 tablet (10 mg total) by mouth daily. 11/12/21   [name], MD  traZODone (DESYREL) 100 MG tablet Take 50 mg by mouth at bedtime.    [provider]  triamcinolone cream (KENALOG) 0.1 % Apply 1 application topically 2 (two) times daily. 09/10/15   Nafziger, Tommi Rumps, NP    Family History Family History  Problem Relation Age of Onset   Heart disease Other    Diabetes Other    Hyperlipidemia Other    Hypertension Sister    Breast cancer Sister    Heart disease Father    Diabetes Father    Heart attack Father        x 2   Prostate cancer Neg Hx    Kidney disease Neg Hx     Social History Social History   Tobacco Use   Smoking status: Former   Smokeless tobacco: Never   Tobacco comments:    quit 35 years  Substance Use Topics   Alcohol use: Yes    Alcohol/week: 0.0 standard drinks of alcohol    Comment: occasional   Drug use: No     Allergies   Fire ant, Codeine, Cyclobenzaprine, Docusate sodium, Dulcolax stool softener [dss], Gabapentin, Ketorolac, Ketorolac tromethamine, Lisinopril, Methadone, and Pregabalin   Review of Systems Review of Systems  Respiratory:  Positive for cough.      Physical Exam Triage Vital Signs ED Triage Vitals  Enc Vitals Group     BP 08/20/22 1430 (!) 144/92     Pulse Rate 08/20/22 1430 (!) 107     Resp 08/20/22 1430 17     Temp 08/20/22 1430 97.9 F (36.6 C)     Temp src --      SpO2 08/20/22 1430 95 %      Weight --      Height --      Head Circumference --      Peak Flow --      Pain Score 08/20/22 1431 0     Pain Loc --      Pain Edu? --      Excl. in [place]? --  No data found.  Updated Vital Signs BP (!) 144/92   Pulse (!) 107   Temp 97.9 F (36.6 C)   Resp 17   SpO2 95%   Visual Acuity Right Eye Distance:   Left Eye Distance:   Bilateral Distance:    Right Eye Near:   Left Eye Near:    Bilateral Near:     Physical Exam Vitals and nursing note reviewed.  Constitutional:      Appearance: He is well-developed.  HENT:     Nose: Congestion present.     Mouth/Throat:     Pharynx: Posterior oropharyngeal erythema present. No oropharyngeal exudate.  Cardiovascular:     Rate and Rhythm: Regular rhythm. Tachycardia present.     Heart sounds: Normal heart sounds.  Pulmonary:     Effort: Pulmonary effort is normal.     Breath sounds: Normal breath sounds.  Skin:    General: Skin is warm and dry.  Neurological:     General: No focal deficit present.     Mental Status: He is alert and oriented to person, place, and time.  Psychiatric:        Mood and Affect: Mood normal.        Behavior: Behavior normal.      UC Treatments / Results  Labs (all labs ordered are listed, but only abnormal results are displayed) Labs Reviewed - No data to display  EKG   Radiology No results found.  Procedures Procedures (including critical care time)  Medications Ordered in UC Medications - No data to display  Initial Impression / Assessment and Plan / UC Course  I have reviewed the triage vital signs and the nursing notes.  Pertinent labs & imaging results that were available during my care of the patient were reviewed by me and considered in my medical decision making (see chart for details).   Patient is afebrile here without recent antipyretics. Satting well on room air. Overall is ill appearing, though well hydrated, without respiratory distress. Pulmonary exam is  unremarkable. Lungs CTAB.  Mildly erythematous pharynx without peritonsillar exudates.  Positive for maxillary sinus tenderness bilaterally.  Will treat for possible secondary bacterial sinus infection with Augmentin.  Will prescribe prednisone for relief of sinus inflammation.  Giving Tussionex for overnight relief of cough.  Final Clinical Impressions(s) / UC Diagnoses   Final diagnoses:  None   Discharge Instructions   None    ED Prescriptions   None    PDMP not reviewed this encounter.   Rose Phi, [place] 08/20/22 1505

## 2022-08-20 NOTE — Discharge Instructions (Addendum)
Follow up here or with your primary care provider if your symptoms are worsening or not improving with treatment.     

## 2022-08-25 ENCOUNTER — Encounter: Payer: Self-pay | Admitting: Family Medicine

## 2022-08-25 NOTE — Telephone Encounter (Signed)
He should make an OV to come in and see [place]

## 2022-08-27 ENCOUNTER — Ambulatory Visit
Admission: EM | Admit: 2022-08-27 | Discharge: 2022-08-27 | Disposition: A | Discharge status: Home / Self Care | Payer: PPO | Attending: Emergency Medicine | Admitting: Emergency Medicine

## 2022-08-27 DIAGNOSIS — R051 Acute cough: Secondary | ICD-10-CM

## 2022-08-27 DIAGNOSIS — J0100 Acute maxillary sinusitis, unspecified: Secondary | ICD-10-CM

## 2022-08-27 MED ORDER — BENZONATATE 100 MG PO CAPS: 100.0000 mg | ORAL_CAPSULE | Freq: Three times a day (TID) | ORAL | 0 refills | Status: DC | PRN

## 2022-08-27 MED ORDER — DOXYCYCLINE HYCLATE 100 MG PO CAPS: 100.0000 mg | ORAL_CAPSULE | Freq: Two times a day (BID) | ORAL | 0 refills | Status: DC

## 2022-08-27 NOTE — ED Provider Notes (Signed)
[name]    CSN: [id number] Arrival date & time: 08/27/22  1109      History   Chief Complaint Chief Complaint  Patient presents with   Nasal Congestion    I am still sick. I came to urgent care the day after Thanksgiving, and I still have an infection.. - Entered by patient    HPI [name] is a 61 y.o. male.  Patient presents with 2-week history of congestion and cough.  He reports yellow sputum when he coughs.  No fever, rash, shortness of breath, chest pain, vomiting, diarrhea, or other symptoms.  Patient was seen at this urgent care on 08/20/2022; diagnosed with sinusitis and treated with Augmentin, prednisone, Tussionex.  His medical history includes hypertension, allergic rhinitis, kidney stones, arthritis, chronic pain.  The history is provided by the patient and medical records.    Past Medical History:  Diagnosis Date   ADHD (attention deficit hyperactivity disorder), inattentive type    sees Dr. Celesta Aver in [place], [place]    sees Dr. Celesta Aver in [place], [place]    Arthritis    Benign enlargement of prostate    sees [facility]    Carpal tunnel syndrome on left    sees Dr. Eugene Garnet at [place] [facility]    sees Dr. Nicholaus Bloom (meds) and Dorathy Kinsman (accupuncture)    Depression    sees Dr. Celesta Aver   Functional impotence    Hypogonadism in male    sees Larene Beach (a [place]) at [facility]    Insomnia    Kidney stones    Sinusitis, acute 07/17/2016   Urticaria due to cold     Patient Active Problem List   Diagnosis Date Noted   COVID-19 virus infection 03/25/2021   Tinea versicolor 11/08/2016   Morton neuroma, right 11/02/2016   Sinusitis, acute 07/17/2016   BPH with obstruction/lower urinary tract symptoms 05/28/2015   Hypogonadism in male 05/28/2015   Kidney stones 05/28/2015   Elevated blood sugar 12/02/2014   Hyperlipidemia, mixed 07/09/2014   Atypical chest pain 06/24/2014   Cough  secondary to angiotensin converting enzyme inhibitor (ACE-I) 10/31/2013   Essential hypertension, benign 10/25/2013   History of hepatitis C 01/01/2013   PITUITARY INSUFFICIENCY 06/13/2009   INSOMNIA 02/28/2009   CHRONIC PAIN SYNDROME 08/28/2007   Allergic rhinitis 08/28/2007   ANGIONEUROTIC EDEMA NOT ELSEWHERE CLASSIFIED 08/28/2007   DERMATITIS, CONTACT, NOS 05/03/2007    Past Surgical History:  Procedure Laterality Date   APPENDECTOMY     CERVICAL FUSION  2008   C6-7 per Dr. Rennis Harding   COLONOSCOPY  11/06/2009   per Dr. Teena Irani, no polyps, repeat in 10 yrs    FOOT SURGERY Right    x 2   LUMBAR DISC SURGERY     L5-S1, C 6-6       Home Medications    Prior to Admission medications   Medication Sig Start Date End Date Taking? Authorizing Provider  benzonatate (TESSALON) 100 MG capsule Take 1 capsule (100 mg total) by mouth 3 (three) times daily as needed for cough. 08/27/22  Yes Sharion Balloon, NP  doxycycline (VIBRAMYCIN) 100 MG capsule Take 1 capsule (100 mg total) by mouth 2 (two) times daily for 7 days. 08/27/22 09/03/22 Yes Sharion Balloon, NP  acyclovir (ZOVIRAX) 800 MG tablet Take 1 tablet (800 mg total) by mouth 2 (two) times daily. 11/13/21   [name], MD  amLODipine (NORVASC) 10 MG tablet TAKE 1 TABLET BY MOUTH DAILY 04/13/22   [name], MD  cetirizine (ZYRTEC) 10 MG tablet Take 10 mg by mouth daily.    [provider]  chlorpheniramine-HYDROcodone (TUSSIONEX) 10-8 MG/5ML Take 5 mLs by mouth every 12 (twelve) hours as needed for up to 7 days for cough. This medication is sedating. Do not drive or operate machinery while taking. 08/20/22 08/27/22  Immordino, Annie Main, FNP  diazepam (VALIUM) 10 MG tablet Take 10 mg by mouth every 6 (six) hours as needed.    [provider]  diclofenac sodium (VOLTAREN) 1 % GEL Apply 1 application topically as needed.    [provider]  EPINEPHrine 0.3 mg/0.3 mL IJ SOAJ injection USE AS DIRECTED 07/02/21    [name], MD  escitalopram (LEXAPRO) 5 MG tablet Take 5 mg by mouth daily. 05/17/22   [provider]  fentaNYL (DURAGESIC - DOSED MCG/HR) 50 MCG/HR Place 50 mcg onto the skin every other day.    [provider]  fluticasone Asencion Islam) 50 MCG/ACT nasal spray USE TWO SPRAYS IN [facility] NOSTRIL DAILY 05/06/20   [name], MD  hydrocortisone-pramoxine (PROCTOFOAM [facility]) rectal foam USE AS DIRECTED 3 TIMES A DAY AS NEEDED 10/16/19   [name], MD  hydrocortisone-pramoxine [facility]) rectal foam Place 1 applicator rectally 2 (two) times daily.    [provider]  ketoconazole (NIZORAL) 200 MG tablet Take 1 tablet (200 mg total) by mouth daily. 11/08/16   [name], MD  lactulose [facility]) 10 GM/15ML solution  12/08/07   [provider]  oxyCODONE (OXY IR/ROXICODONE) 5 MG immediate release tablet TAKE 1 TABLET BY MOUTH EVERY SIX HOURS AS NEEDED FOR PAIN 08/25/16   [provider]  PARoxetine (PAXIL) 20 MG tablet Take 20 mg by mouth every morning. 05/10/22   [provider]  promethazine (PHENERGAN) 25 MG tablet Reported on 10/30/2015 09/06/09   [provider]  risperiDONE (RISPERDAL) 0.5 MG tablet Take by mouth. 06/04/22   [provider]  rosuvastatin (CRESTOR) 10 MG tablet Take 1 tablet (10 mg total) by mouth daily. 11/12/21   [name], MD  traZODone (DESYREL) 100 MG tablet Take 50 mg by mouth at bedtime.    [provider]  triamcinolone cream (KENALOG) 0.1 % Apply 1 application topically 2 (two) times daily. 09/10/15   Nafziger, Tommi Rumps, NP    Family History Family History  Problem Relation Age of Onset   Heart disease Other    Diabetes Other    Hyperlipidemia Other    Hypertension Sister    Breast cancer Sister    Heart disease Father    Diabetes Father    Heart attack Father        x 2   Prostate cancer Neg Hx    Kidney disease Neg Hx     Social History Social History   Tobacco Use    Smoking status: Former   Smokeless tobacco: Never   Tobacco comments:    quit 35 years  Substance Use Topics   Alcohol use: Yes    Alcohol/week: 0.0 standard drinks of alcohol    Comment: occasional   Drug use: No     Allergies   Fire ant, Codeine, Cyclobenzaprine, Docusate sodium, Dulcolax stool softener [dss], Gabapentin, Ketorolac, Ketorolac tromethamine, Lisinopril, Methadone, and Pregabalin   Review of Systems Review of Systems  Constitutional:  Negative for chills and fever.  HENT:  Positive for congestion. Negative  for ear pain and sore throat.   Respiratory:  Positive for cough. Negative for shortness of breath.   Cardiovascular:  Negative for chest pain and palpitations.  Gastrointestinal:  Negative for diarrhea and vomiting.  Skin:  Negative for color change and rash.  All other systems reviewed and are negative.    Physical Exam Triage Vital Signs ED Triage Vitals  Enc Vitals Group     BP 08/27/22 1213 134/89     Pulse Rate 08/27/22 1204 86     Resp 08/27/22 1204 18     Temp 08/27/22 1204 98 F (36.7 C)     Temp src --      SpO2 08/27/22 1204 97 %     Weight 08/27/22 1209 167 lb (75.8 kg)     Height 08/27/22 1209 '5\' 9"'$  (1.753 m)     Head Circumference --      Peak Flow --      Pain Score 08/27/22 1207 4     Pain Loc --      Pain Edu? --      Excl. in [place]? --    No data found.  Updated Vital Signs BP 134/89   Pulse 86   Temp 98 F (36.7 C)   Resp 18   Ht '5\' 9"'$  (1.753 m)   Wt 167 lb (75.8 kg)   SpO2 97%   BMI 24.66 kg/m   Visual Acuity Right Eye Distance:   Left Eye Distance:   Bilateral Distance:    Right Eye Near:   Left Eye Near:    Bilateral Near:     Physical Exam Vitals and nursing note reviewed.  Constitutional:      General: He is not in acute distress.    Appearance: Normal appearance. He is well-developed. He is not ill-appearing.  HENT:     Right Ear: Tympanic membrane normal.     Left Ear: Tympanic membrane normal.      Nose: Nose normal.     Mouth/Throat:     Mouth: Mucous membranes are moist.     Pharynx: Oropharynx is clear.  Eyes:     Conjunctiva/sclera: Conjunctivae normal.  Cardiovascular:     Rate and Rhythm: Normal rate and regular rhythm.     Heart sounds: Normal heart sounds.  Pulmonary:     Effort: Pulmonary effort is normal. No respiratory distress.     Breath sounds: Normal breath sounds.  Musculoskeletal:     Cervical back: Neck supple.  Skin:    General: Skin is warm and dry.  Neurological:     Mental Status: He is alert.  Psychiatric:        Mood and Affect: Mood normal.        Behavior: Behavior normal.      UC Treatments / Results  Labs (all labs ordered are listed, but only abnormal results are displayed) Labs Reviewed - No data to display  EKG   Radiology No results found.  Procedures Procedures (including critical care time)  Medications Ordered in UC Medications - No data to display  Initial Impression / Assessment and Plan / UC Course  I have reviewed the triage vital signs and the nursing notes.  Pertinent labs & imaging results that were available during my care of the patient were reviewed by me and considered in my medical decision making (see chart for details).   Cough, acute sinusitis.  No respiratory distress, lungs are clear, O2 sat 97% on room air.  Patient  has been symptomatic for 2 weeks.  He completed course of Augmentin and prednisone without improvement.  Treating today with doxycycline and Tessalon Perles.  Instructed patient to follow up with his PCP next week.  He agrees to plan of care.     Final Clinical Impressions(s) / UC Diagnoses   Final diagnoses:  Acute cough  Acute non-recurrent maxillary sinusitis     Discharge Instructions      Take the doxycycline and Tessalon Perles as directed.  Follow up with your primary care provider.        ED Prescriptions     Medication Sig Dispense Auth. Provider   doxycycline  (VIBRAMYCIN) 100 MG capsule Take 1 capsule (100 mg total) by mouth 2 (two) times daily for 7 days. 14 capsule Sharion Balloon, NP   benzonatate (TESSALON) 100 MG capsule Take 1 capsule (100 mg total) by mouth 3 (three) times daily as needed for cough. 21 capsule Sharion Balloon, NP      PDMP not reviewed this encounter.   Sharion Balloon, NP 08/27/22 1331

## 2022-08-27 NOTE — ED Triage Notes (Signed)
Patient to Urgent Care with complaints of persistent URI symptoms. Reports lingering head congestion and sore throat. Productive cough with yellow/green sputum. Denies any recent fevers.   Patient was seen 11/24 for a sinus infection and completed Augmentin and prednisone. Reports that symptoms did not resolve.

## 2022-08-27 NOTE — Discharge Instructions (Addendum)
Take the doxycycline and Tessalon Perles as directed.  Follow up with your primary care provider.

## 2022-08-30 ENCOUNTER — Ambulatory Visit: Payer: PPO | Admitting: Family Medicine

## 2022-09-01 ENCOUNTER — Encounter: Payer: Self-pay | Admitting: Family Medicine

## 2022-09-01 ENCOUNTER — Ambulatory Visit (INDEPENDENT_AMBULATORY_CARE_PROVIDER_SITE_OTHER): Payer: PPO | Admitting: Family Medicine

## 2022-09-01 ENCOUNTER — Ambulatory Visit: Payer: PPO | Admitting: Family Medicine

## 2022-09-01 VITALS — BP 136/86 | HR 78 | Temp 98.0°F | Wt 167.2 lb

## 2022-09-01 DIAGNOSIS — J020 Streptococcal pharyngitis: Secondary | ICD-10-CM

## 2022-09-01 MED ORDER — CEFUROXIME AXETIL 500 MG PO TABS: 500.0000 mg | ORAL_TABLET | Freq: Two times a day (BID) | ORAL | 0 refills | Status: AC

## 2022-09-01 NOTE — Progress Notes (Signed)
   Subjective:    Patient ID: [name], male    DOB: 1961-09-19, 61 y.o.   MRN: [id number]  HPI Here to follow up on two recent urgent care visits. For over 2 weeks he has had stuffy head, PND, ST, and coughing up yellow sputum. No fever. He went to [facility] on 08-20-22 and was diagnosed with a sinus infection. He was treated with Augmentin and Prednisone, but he did not improve. He went back to the UC on 08-27-22 and was started on Doxycycline. He still is not improving much. The ST is not as intense but the coughing persists. He tested positive for strep today.    Review of Systems  Constitutional: Negative.   HENT:  Positive for congestion, postnasal drip and sore throat. Negative for ear pain and sinus pressure.   Eyes: Negative.   Respiratory:  Positive for cough. Negative for shortness of breath and wheezing.        Objective:   Physical Exam Constitutional:      Appearance: Normal appearance. He is not ill-appearing.  HENT:     Right Ear: Tympanic membrane, ear canal and external ear normal.     Left Ear: Tympanic membrane, ear canal and external ear normal.     Nose: Nose normal.     Mouth/Throat:     Pharynx: Posterior oropharyngeal erythema present. No oropharyngeal exudate.  Eyes:     Conjunctiva/sclera: Conjunctivae normal.  Pulmonary:     Effort: Pulmonary effort is normal.     Breath sounds: Normal breath sounds.  Lymphadenopathy:     Cervical: No cervical adenopathy.  Neurological:     Mental Status: He is alert.           Assessment & Plan:  Strep pharyngitis. Stop the Doxycyline and start 10 days of Cefuroxime. Recheck as needed. We spent a total of (31   ) minutes reviewing records and discussing these issues.  Alysia Penna, MD

## 2022-09-02 ENCOUNTER — Other Ambulatory Visit: Payer: Self-pay | Admitting: Family Medicine

## 2022-09-03 ENCOUNTER — Ambulatory Visit: Payer: PPO | Admitting: Family Medicine

## 2022-09-14 DIAGNOSIS — H5213 Myopia, bilateral: Secondary | ICD-10-CM | POA: Diagnosis not present

## 2022-09-30 DIAGNOSIS — M25512 Pain in left shoulder: Secondary | ICD-10-CM | POA: Diagnosis not present

## 2022-09-30 DIAGNOSIS — G894 Chronic pain syndrome: Secondary | ICD-10-CM | POA: Diagnosis not present

## 2022-09-30 DIAGNOSIS — G5752 Tarsal tunnel syndrome, left lower limb: Secondary | ICD-10-CM | POA: Diagnosis not present

## 2022-09-30 DIAGNOSIS — M5412 Radiculopathy, cervical region: Secondary | ICD-10-CM | POA: Diagnosis not present

## 2022-11-23 DIAGNOSIS — F322 Major depressive disorder, single episode, severe without psychotic features: Secondary | ICD-10-CM | POA: Diagnosis not present

## 2022-11-25 DIAGNOSIS — M25512 Pain in left shoulder: Secondary | ICD-10-CM | POA: Diagnosis not present

## 2022-11-25 DIAGNOSIS — M5412 Radiculopathy, cervical region: Secondary | ICD-10-CM | POA: Diagnosis not present

## 2022-11-25 DIAGNOSIS — G5752 Tarsal tunnel syndrome, left lower limb: Secondary | ICD-10-CM | POA: Diagnosis not present

## 2022-11-25 DIAGNOSIS — G894 Chronic pain syndrome: Secondary | ICD-10-CM | POA: Diagnosis not present

## 2022-12-10 ENCOUNTER — Emergency Department (HOSPITAL_COMMUNITY): Payer: PPO

## 2022-12-10 ENCOUNTER — Observation Stay (HOSPITAL_COMMUNITY): Payer: PPO

## 2022-12-10 ENCOUNTER — Encounter (HOSPITAL_COMMUNITY)
Admission: EM | Disposition: A | Discharge status: Home / Self Care | Payer: Self-pay | Source: Home / Self Care | Attending: Emergency Medicine

## 2022-12-10 ENCOUNTER — Observation Stay (HOSPITAL_COMMUNITY)
Admission: EM | Admit: 2022-12-10 | Discharge: 2022-12-11 | Disposition: A | Discharge status: Home / Self Care | Payer: PPO | Attending: Student | Admitting: Student

## 2022-12-10 ENCOUNTER — Encounter (HOSPITAL_COMMUNITY): Payer: Self-pay

## 2022-12-10 DIAGNOSIS — F32A Depression, unspecified: Secondary | ICD-10-CM | POA: Diagnosis present

## 2022-12-10 DIAGNOSIS — E785 Hyperlipidemia, unspecified: Secondary | ICD-10-CM | POA: Diagnosis not present

## 2022-12-10 DIAGNOSIS — I214 Non-ST elevation (NSTEMI) myocardial infarction: Secondary | ICD-10-CM | POA: Diagnosis not present

## 2022-12-10 DIAGNOSIS — F419 Anxiety disorder, unspecified: Secondary | ICD-10-CM | POA: Diagnosis not present

## 2022-12-10 DIAGNOSIS — R778 Other specified abnormalities of plasma proteins: Secondary | ICD-10-CM | POA: Diagnosis not present

## 2022-12-10 DIAGNOSIS — Z79899 Other long term (current) drug therapy: Secondary | ICD-10-CM | POA: Insufficient documentation

## 2022-12-10 DIAGNOSIS — R072 Precordial pain: Secondary | ICD-10-CM

## 2022-12-10 DIAGNOSIS — R079 Chest pain, unspecified: Secondary | ICD-10-CM | POA: Insufficient documentation

## 2022-12-10 DIAGNOSIS — I25119 Atherosclerotic heart disease of native coronary artery with unspecified angina pectoris: Secondary | ICD-10-CM

## 2022-12-10 DIAGNOSIS — E782 Mixed hyperlipidemia: Secondary | ICD-10-CM | POA: Diagnosis not present

## 2022-12-10 DIAGNOSIS — R7989 Other specified abnormal findings of blood chemistry: Secondary | ICD-10-CM | POA: Insufficient documentation

## 2022-12-10 DIAGNOSIS — I10 Essential (primary) hypertension: Secondary | ICD-10-CM | POA: Diagnosis not present

## 2022-12-10 DIAGNOSIS — G8929 Other chronic pain: Secondary | ICD-10-CM | POA: Diagnosis present

## 2022-12-10 DIAGNOSIS — I2510 Atherosclerotic heart disease of native coronary artery without angina pectoris: Secondary | ICD-10-CM

## 2022-12-10 DIAGNOSIS — R0789 Other chest pain: Secondary | ICD-10-CM | POA: Diagnosis not present

## 2022-12-10 DIAGNOSIS — G894 Chronic pain syndrome: Secondary | ICD-10-CM | POA: Diagnosis not present

## 2022-12-10 DIAGNOSIS — Z87891 Personal history of nicotine dependence: Secondary | ICD-10-CM | POA: Diagnosis not present

## 2022-12-10 HISTORY — DX: Atherosclerotic heart disease of native coronary artery with unspecified angina pectoris: I25.119

## 2022-12-10 HISTORY — PX: LEFT HEART CATH AND CORONARY ANGIOGRAPHY: CATH118249

## 2022-12-10 HISTORY — PX: CORONARY STENT INTERVENTION: CATH118234

## 2022-12-10 HISTORY — DX: Non-ST elevation (NSTEMI) myocardial infarction: I21.4

## 2022-12-10 LAB — CBC
HCT: 48.8 % (ref 39.0–52.0)
Hemoglobin: 16.9 g/dL (ref 13.0–17.0)
MCH: 31.1 pg (ref 26.0–34.0)
MCHC: 34.6 g/dL (ref 30.0–36.0)
MCV: 89.9 fL (ref 80.0–100.0)
Platelets: 225 10*3/uL (ref 150–400)
RBC: 5.43 MIL/uL (ref 4.22–5.81)
RDW: 12.9 % (ref 11.5–15.5)
WBC: 7.9 10*3/uL (ref 4.0–10.5)
nRBC: 0 % (ref 0.0–0.2)

## 2022-12-10 LAB — BASIC METABOLIC PANEL
Anion gap: 11 (ref 5–15)
BUN: 13 mg/dL (ref 6–20)
CO2: 21 mmol/L — ABNORMAL LOW (ref 22–32)
Calcium: 8.8 mg/dL — ABNORMAL LOW (ref 8.9–10.3)
Chloride: 103 mmol/L (ref 98–111)
Creatinine, Ser: 0.99 mg/dL (ref 0.61–1.24)
GFR, Estimated: >60 mL/min (ref >60)
Glucose, Bld: 123 mg/dL — ABNORMAL HIGH (ref 70–99)
Potassium: 4.7 mmol/L (ref 3.5–5.1)
Sodium: 135 mmol/L (ref 135–145)

## 2022-12-10 LAB — TROPONIN I (HIGH SENSITIVITY)
Troponin I (High Sensitivity): 506 ng/L (ref <18)
Troponin I (High Sensitivity): 543 ng/L (ref <18)
Troponin I (High Sensitivity): 658 ng/L (ref <18)

## 2022-12-10 LAB — LIPID PANEL
Cholesterol: 238 mg/dL — ABNORMAL HIGH (ref 0–200)
HDL: 61 mg/dL (ref >40)
LDL Cholesterol: 163 mg/dL — ABNORMAL HIGH (ref 0–99)
Total CHOL/HDL Ratio: 3.9 RATIO
Triglycerides: 71 mg/dL (ref <150)
VLDL: 14 mg/dL (ref 0–40)

## 2022-12-10 LAB — POCT ACTIVATED CLOTTING TIME
Activated Clotting Time: 363 seconds
Activated Clotting Time: 304 seconds

## 2022-12-10 LAB — HIV ANTIBODY (ROUTINE TESTING W REFLEX): HIV Screen 4th Generation wRfx: NONREACTIVE

## 2022-12-10 SURGERY — LEFT HEART CATH AND CORONARY ANGIOGRAPHY
Anesthesia: LOCAL

## 2022-12-10 MED ORDER — NITROGLYCERIN 0.4 MG SL SUBL
0.4000 mg | SUBLINGUAL_TABLET | SUBLINGUAL | Status: DC | PRN
  Administered 2022-12-10: 0.4 mg via SUBLINGUAL
  Filled 2022-12-10: qty 1

## 2022-12-10 MED ORDER — MIDAZOLAM HCL 2 MG/2ML IJ SOLN
INTRAMUSCULAR | Status: AC
  Filled 2022-12-10: qty 2

## 2022-12-10 MED ORDER — SODIUM CHLORIDE 0.9% FLUSH
3.0000 mL | Freq: Two times a day (BID) | INTRAVENOUS | Status: DC
  Administered 2022-12-11: 3 mL via INTRAVENOUS

## 2022-12-10 MED ORDER — AMLODIPINE BESYLATE 10 MG PO TABS
10.0000 mg | ORAL_TABLET | Freq: Every evening | ORAL | Status: DC
  Administered 2022-12-10: 10 mg via ORAL
  Filled 2022-12-10: qty 1

## 2022-12-10 MED ORDER — HEPARIN (PORCINE) 25000 UT/250ML-% IV SOLN
900.0000 [IU]/h | INTRAVENOUS | Status: DC
  Administered 2022-12-10: 900 [IU]/h via INTRAVENOUS
  Filled 2022-12-10: qty 250

## 2022-12-10 MED ORDER — FENTANYL CITRATE (PF) 100 MCG/2ML IJ SOLN
INTRAMUSCULAR | Status: AC
  Filled 2022-12-10: qty 2

## 2022-12-10 MED ORDER — HEPARIN SODIUM (PORCINE) 1000 UNIT/ML IJ SOLN
INTRAMUSCULAR | Status: AC
  Filled 2022-12-10: qty 10

## 2022-12-10 MED ORDER — VERAPAMIL HCL 2.5 MG/ML IV SOLN
INTRAVENOUS | Status: AC
  Filled 2022-12-10: qty 2

## 2022-12-10 MED ORDER — HYDRALAZINE HCL 20 MG/ML IJ SOLN: 10.0000 mg | INTRAMUSCULAR | Status: AC | PRN

## 2022-12-10 MED ORDER — LORATADINE 10 MG PO TABS
10.0000 mg | ORAL_TABLET | Freq: Every day | ORAL | Status: DC
  Filled 2022-12-10: qty 1

## 2022-12-10 MED ORDER — MIDAZOLAM HCL 2 MG/2ML IJ SOLN
INTRAMUSCULAR | Status: DC | PRN
  Administered 2022-12-10 (×3): 2 mg via INTRAVENOUS

## 2022-12-10 MED ORDER — SODIUM CHLORIDE 0.9 % IV SOLN: INTRAVENOUS | Status: DC

## 2022-12-10 MED ORDER — HEPARIN BOLUS VIA INFUSION
4000.0000 [IU] | Freq: Once | INTRAVENOUS | Status: AC
  Administered 2022-12-10: 4000 [IU] via INTRAVENOUS
  Filled 2022-12-10: qty 4000

## 2022-12-10 MED ORDER — ATORVASTATIN CALCIUM 80 MG PO TABS
80.0000 mg | ORAL_TABLET | Freq: Every day | ORAL | Status: DC
  Administered 2022-12-10 – 2022-12-11 (×2): 80 mg via ORAL
  Filled 2022-12-10 (×2): qty 1

## 2022-12-10 MED ORDER — SODIUM CHLORIDE 0.9 % WEIGHT BASED INFUSION
1.0000 mL/kg/h | INTRAVENOUS | Status: AC
  Administered 2022-12-10: 1 mL/kg/h via INTRAVENOUS

## 2022-12-10 MED ORDER — HEPARIN SODIUM (PORCINE) 1000 UNIT/ML IJ SOLN
INTRAMUSCULAR | Status: DC | PRN
  Administered 2022-12-10 (×2): 4000 [IU] via INTRAVENOUS

## 2022-12-10 MED ORDER — LIDOCAINE HCL (PF) 1 % IJ SOLN
INTRAMUSCULAR | Status: AC
  Filled 2022-12-10: qty 30

## 2022-12-10 MED ORDER — NITROGLYCERIN 1 MG/10 ML FOR IR/CATH LAB
INTRA_ARTERIAL | Status: DC | PRN
  Administered 2022-12-10: 150 ug via INTRACORONARY

## 2022-12-10 MED ORDER — NITROGLYCERIN 1 MG/10 ML FOR IR/CATH LAB
INTRA_ARTERIAL | Status: AC
  Filled 2022-12-10: qty 10

## 2022-12-10 MED ORDER — ONDANSETRON HCL 4 MG/2ML IJ SOLN
INTRAMUSCULAR | Status: AC
  Filled 2022-12-10: qty 2

## 2022-12-10 MED ORDER — TICAGRELOR 90 MG PO TABS
90.0000 mg | ORAL_TABLET | Freq: Two times a day (BID) | ORAL | Status: DC
  Administered 2022-12-11: 90 mg via ORAL
  Filled 2022-12-10: qty 1

## 2022-12-10 MED ORDER — LIDOCAINE HCL (PF) 1 % IJ SOLN
INTRAMUSCULAR | Status: DC | PRN
  Administered 2022-12-10: 2 mL

## 2022-12-10 MED ORDER — TICAGRELOR 90 MG PO TABS
ORAL_TABLET | ORAL | Status: AC
  Filled 2022-12-10: qty 2

## 2022-12-10 MED ORDER — IOHEXOL 350 MG/ML SOLN
INTRAVENOUS | Status: DC | PRN
  Administered 2022-12-10: 115 mL

## 2022-12-10 MED ORDER — VERAPAMIL HCL 2.5 MG/ML IV SOLN
INTRAVENOUS | Status: DC | PRN
  Administered 2022-12-10: 10 mL via INTRA_ARTERIAL

## 2022-12-10 MED ORDER — SODIUM CHLORIDE 0.9 % IV SOLN: 250.0000 mL | INTRAVENOUS | Status: DC | PRN

## 2022-12-10 MED ORDER — LORATADINE 10 MG PO TABS
10.0000 mg | ORAL_TABLET | Freq: Every evening | ORAL | Status: DC | PRN
  Administered 2022-12-10 – 2022-12-11 (×2): 10 mg via ORAL
  Filled 2022-12-10: qty 1

## 2022-12-10 MED ORDER — ONDANSETRON HCL 4 MG/2ML IJ SOLN
4.0000 mg | Freq: Four times a day (QID) | INTRAMUSCULAR | Status: DC | PRN
  Administered 2022-12-10: 4 mg via INTRAVENOUS
  Filled 2022-12-10: qty 2

## 2022-12-10 MED ORDER — HEPARIN (PORCINE) 25000 UT/250ML-% IV SOLN: 12.0000 [IU]/kg/h | INTRAVENOUS | Status: DC

## 2022-12-10 MED ORDER — DIAZEPAM 5 MG PO TABS
10.0000 mg | ORAL_TABLET | Freq: Four times a day (QID) | ORAL | Status: DC | PRN
  Administered 2022-12-10: 10 mg via ORAL
  Filled 2022-12-10: qty 2

## 2022-12-10 MED ORDER — TICAGRELOR 90 MG PO TABS
ORAL_TABLET | ORAL | Status: DC | PRN
  Administered 2022-12-10: 180 mg via ORAL

## 2022-12-10 MED ORDER — ACETAMINOPHEN 325 MG PO TABS: 650.0000 mg | ORAL_TABLET | ORAL | Status: DC | PRN

## 2022-12-10 MED ORDER — SODIUM CHLORIDE 0.9% FLUSH: 3.0000 mL | INTRAVENOUS | Status: DC | PRN

## 2022-12-10 MED ORDER — ASPIRIN 81 MG PO TBEC
81.0000 mg | DELAYED_RELEASE_TABLET | Freq: Every day | ORAL | Status: DC
  Administered 2022-12-11: 81 mg via ORAL
  Filled 2022-12-10: qty 1

## 2022-12-10 MED ORDER — OXYCODONE HCL 5 MG PO TABS: 5.0000 mg | ORAL_TABLET | ORAL | Status: DC | PRN

## 2022-12-10 MED ORDER — LABETALOL HCL 5 MG/ML IV SOLN: 10.0000 mg | INTRAVENOUS | Status: AC | PRN

## 2022-12-10 MED ORDER — HEPARIN SODIUM (PORCINE) 5000 UNIT/ML IJ SOLN: 4000.0000 [IU] | Freq: Once | INTRAMUSCULAR | Status: DC

## 2022-12-10 MED ORDER — FENTANYL CITRATE (PF) 100 MCG/2ML IJ SOLN
INTRAMUSCULAR | Status: DC | PRN
  Administered 2022-12-10 (×3): 25 ug via INTRAVENOUS

## 2022-12-10 MED ORDER — ONDANSETRON HCL 4 MG/2ML IJ SOLN
INTRAMUSCULAR | Status: DC | PRN
  Administered 2022-12-10: 4 mg via INTRAVENOUS

## 2022-12-10 MED ORDER — FLUTICASONE PROPIONATE 50 MCG/ACT NA SUSP
2.0000 | Freq: Every day | NASAL | Status: DC
  Administered 2022-12-10 – 2022-12-11 (×2): 2 via NASAL
  Filled 2022-12-10: qty 16

## 2022-12-10 MED ORDER — TRAZODONE HCL 50 MG PO TABS
50.0000 mg | ORAL_TABLET | Freq: Every day | ORAL | Status: DC
  Administered 2022-12-10: 50 mg via ORAL
  Filled 2022-12-10: qty 1

## 2022-12-10 SURGICAL SUPPLY — 21 items
BALL SAPPHIRE NC24 2.50X15 (BALLOONS) ×1
BALLN SAPPHIRE 2.0X12 (BALLOONS) ×1
BALLOON SAPPHIRE 2.0X12 (BALLOONS) IMPLANT
BALLOON SAPPHIRE NC24 2.50X15 (BALLOONS) IMPLANT
CATH 5FR JL3.5 JR4 ANG PIG MP (CATHETERS) IMPLANT
CATH LAUNCHER 6FR EBU3.5 (CATHETERS) IMPLANT
DEVICE RAD COMP TR BAND LRG (VASCULAR PRODUCTS) IMPLANT
ELECT DEFIB PAD ADLT CADENCE (PAD) IMPLANT
GLIDESHEATH SLEND SS 6F .021 (SHEATH) IMPLANT
GUIDEWIRE INQWIRE 1.5J.035X260 (WIRE) IMPLANT
INQWIRE 1.5J .035X260CM (WIRE) ×1
KIT ENCORE 26 ADVANTAGE (KITS) IMPLANT
KIT HEART LEFT (KITS) ×1 IMPLANT
PACK CARDIAC CATHETERIZATION (CUSTOM PROCEDURE TRAY) ×1 IMPLANT
STENT SYNERGY XD 2.25X24 (Permanent Stent) IMPLANT
SYNERGY XD 2.25X24 (Permanent Stent) ×1 IMPLANT
TRANSDUCER W/STOPCOCK (MISCELLANEOUS) ×1 IMPLANT
TUBING CIL FLEX 10 FLL-RA (TUBING) ×1 IMPLANT
VALVE GUARDIAN II ~~LOC~~ HEMO (MISCELLANEOUS) IMPLANT
WIRE COUGAR XT STRL 190CM (WIRE) IMPLANT
WIRE HI TORQ WHISPER MS 190CM (WIRE) IMPLANT

## 2022-12-10 NOTE — Progress Notes (Signed)
ANTICOAGULATION CONSULT NOTE - Initial Consult  Pharmacy Consult for heparin Indication: ACS  Allergies  Allergen Reactions   Fire Ant Anaphylaxis    Has Epi pen Has Epi pen    Codeine     REACTION: ITCHING   Cyclobenzaprine Other (See Comments)    Nightmares   Docusate Sodium     swelling   Dulcolax Stool Softener [Dss]     swelling   Gabapentin     migraine   Ketorolac     Other reaction(s): Vomiting (intolerance)   Ketorolac Tromethamine Other (See Comments)    REACTION: SEVERE VOMMITING   Lisinopril     Cough & chest tightness   Methadone Other (See Comments)    Loss of appetite   Pregabalin     headaches Other reaction(s): Confusion (intolerance)    Patient Measurements: Height: 5\' 9"  (175.3 cm) Weight: 74.8 kg (165 lb) IBW/kg (Calculated) : 70.7 Heparin Dosing Weight: TBW  Vital Signs: Temp: 97.8 F (36.6 C) (03/15 1056) Temp Source: Axillary (03/15 1056) BP: 125/91 (03/15 1056) Pulse Rate: 90 (03/15 1056)  Labs: Recent Labs    12/10/22 1102  HGB 16.9  HCT 48.8  PLT 225  CREATININE 0.99  TROPONINIHS 506*    Estimated Creatinine Clearance: 79.3 mL/min (by C-G formula based on SCr of 0.99 mg/dL).   Medical History: Past Medical History:  Diagnosis Date   ADHD (attention deficit hyperactivity disorder), inattentive type    sees Dr. Celesta Aver in [place], [place]    sees Dr. Celesta Aver in [place], [place]    Arthritis    Benign enlargement of prostate    sees [facility]    Carpal tunnel syndrome on left    sees Dr. Eugene Garnet at [place] [facility]    sees Dr. Nicholaus Bloom (meds) and Dorathy Kinsman (accupuncture)    Depression    sees Dr. Celesta Aver   Functional impotence    Hypogonadism in male    sees Larene Beach (a [place]) at [facility]    Insomnia    Kidney stones    Sinusitis, acute 07/17/2016   Urticaria due to cold     Assessment: 42 YOM presenting with CP, he is not on anticoagulation PTA, CBC  wnl  Goal of Therapy:  Heparin level 0.3-0.7 units/ml Monitor platelets by anticoagulation protocol: Yes   Plan:  Heparin 4000 units IV x 1, and gtt at 900 units/hr F/u 6 hour heparin level F/u cards eval and recs  Bertis Ruddy, PharmD, [place] Pharmacist ED Pharmacist Phone # [phone] 12/10/2022 12:59 PM

## 2022-12-10 NOTE — Progress Notes (Addendum)
This RN and Vassie Loll, RCIS called back to pt bedside after post procedural floor arrival. Per pt's 6E RN, upon first deflation of TR band, the pt began to bleed profusely at the site. 6E RN was holding manual pressure proximal to the TR band upon this RN's arrival. Per 6E RN, the TR band had 14 cc's of air in it but the site was still bleeding. This RN and Kaden, RCIS removed the soiled TR band, applied a new TR band, and inserted 12 cc's of air. Site is clean, dry, intact and level 0 with no hematoma at this time. 6E RN understands to begin deflations in reference to the new band placement time and will deflate slowly.

## 2022-12-10 NOTE — ED Provider Notes (Addendum)
[facility] Provider Note   CSN: [id number] Arrival date & time: 12/10/22  1049     History  Chief Complaint  Patient presents with   Chest Pain    [name] is a 62 y.o. male.  Patient with onset of substernal chest pain last evening while at a homeowners meeting.  Went home got worse now radiating to left arm.  Initially thought he was having a panic attack.  He says this pain now feels different.  Called EMS to bring him in.  EMS gave him nitro which helped significantly pain is very minimal but not resolved and they gave him aspirin.  Past medical history negative for any known history of coronary artery disease.  Is positive for chronic pain history of kidney stones attention deficit disorder anxiety past surgical history significant for appendectomy lumbar disc surgery foot surgery patient is a former user of tobacco products.  Patient has cardiac risk factors and a father that had an MI prior to age 70.  Patient also states that the pain is made better by sitting up and worse by lying down.       Home Medications Prior to Admission medications   Medication Sig Start Date End Date Taking? Authorizing Provider  acyclovir (ZOVIRAX) 800 MG tablet Take 1 tablet (800 mg total) by mouth 2 (two) times daily. 11/13/21   [name], MD  amLODipine (NORVASC) 10 MG tablet TAKE 1 TABLET BY MOUTH DAILY 04/13/22   [name], MD  benzonatate (TESSALON) 100 MG capsule Take 1 capsule (100 mg total) by mouth 3 (three) times daily as needed for cough. 08/27/22   Sharion Balloon, NP  cetirizine (ZYRTEC) 10 MG tablet Take 10 mg by mouth daily.    [provider]  diazepam (VALIUM) 10 MG tablet Take 10 mg by mouth every 6 (six) hours as needed.    [provider]  diclofenac sodium (VOLTAREN) 1 % GEL Apply 1 application topically as needed.    [provider]  EPINEPHrine 0.3 mg/0.3 mL IJ SOAJ injection USE AS  DIRECTED 09/03/22   [name], MD  escitalopram (LEXAPRO) 5 MG tablet Take 5 mg by mouth daily. 05/17/22   [provider]  fentaNYL (DURAGESIC - DOSED MCG/HR) 50 MCG/HR Place 50 mcg onto the skin every other day.    [provider]  fluticasone Asencion Islam) 50 MCG/ACT nasal spray USE TWO SPRAYS IN [facility] NOSTRIL DAILY 05/06/20   [name], MD  hydrocortisone-pramoxine (PROCTOFOAM [facility]) rectal foam USE AS DIRECTED 3 TIMES A DAY AS NEEDED 10/16/19   [name], MD  hydrocortisone-pramoxine [facility]) rectal foam Place 1 applicator rectally 2 (two) times daily.    [provider]  ketoconazole (NIZORAL) 200 MG tablet Take 1 tablet (200 mg total) by mouth daily. 11/08/16   [name], MD  lactulose [facility]) 10 GM/15ML solution  12/08/07   [provider]  oxyCODONE (OXY IR/ROXICODONE) 5 MG immediate release tablet TAKE 1 TABLET BY MOUTH EVERY SIX HOURS AS NEEDED FOR PAIN 08/25/16   [provider]  promethazine (PHENERGAN) 25 MG tablet Reported on 10/30/2015 09/06/09   [provider]  risperiDONE (RISPERDAL) 0.5 MG tablet Take by mouth. 06/04/22   [provider]  rosuvastatin (CRESTOR) 10 MG tablet Take 1 tablet (10 mg total) by mouth daily. 11/12/21   [name], MD  traZODone (DESYREL) 100 MG tablet Take 50 mg by  mouth at bedtime.    [provider]  triamcinolone cream (KENALOG) 0.1 % Apply 1 application topically 2 (two) times daily. 09/10/15   Nafziger, Tommi Rumps, NP      Allergies    Fire ant, Codeine, Cyclobenzaprine, Docusate sodium, Dulcolax stool softener [dss], Gabapentin, Ketorolac, Ketorolac tromethamine, Lisinopril, Methadone, and Pregabalin    Review of Systems   Review of Systems  Constitutional:  Negative for chills and fever.  HENT:  Negative for ear pain and sore throat.   Eyes:  Negative for pain and visual disturbance.  Respiratory:  Negative for cough and shortness of breath.   Cardiovascular:   Positive for chest pain. Negative for palpitations.  Gastrointestinal:  Negative for abdominal pain and vomiting.  Genitourinary:  Negative for dysuria and hematuria.  Musculoskeletal:  Negative for arthralgias and back pain.  Skin:  Negative for color change and rash.  Neurological:  Negative for seizures and syncope.  All other systems reviewed and are negative.   Physical Exam Updated Vital Signs BP (!) 125/91 (BP Location: Right Arm)   Pulse 90   Temp 97.8 F (36.6 C) (Axillary)   Resp 17   Ht 1.753 m (5\' 9" )   Wt 74.8 kg   SpO2 97%   BMI 24.37 kg/m  Physical Exam Vitals and nursing note reviewed.  Constitutional:      General: He is not in acute distress.    Appearance: He is well-developed.  HENT:     Head: Normocephalic and atraumatic.  Eyes:     Extraocular Movements: Extraocular movements intact.     Conjunctiva/sclera: Conjunctivae normal.     Pupils: Pupils are equal, round, and reactive to light.  Cardiovascular:     Rate and Rhythm: Normal rate and regular rhythm.     Heart sounds: Normal heart sounds. No murmur heard. Pulmonary:     Effort: Pulmonary effort is normal. No respiratory distress.     Breath sounds: Normal breath sounds. No decreased breath sounds, wheezing, rhonchi or rales.  Abdominal:     Palpations: Abdomen is soft.     Tenderness: There is no abdominal tenderness.  Musculoskeletal:        General: No swelling.     Cervical back: Neck supple.     Right lower leg: No edema.     Left lower leg: No edema.  Skin:    General: Skin is warm and dry.     Capillary Refill: Capillary refill takes less than 2 seconds.  Neurological:     General: No focal deficit present.     Mental Status: He is alert and oriented to person, place, and time.  Psychiatric:        Mood and Affect: Mood normal.     ED Results / Procedures / Treatments   Labs (all labs ordered are listed, but only abnormal results are displayed) Labs Reviewed  BASIC  METABOLIC PANEL - Abnormal; Notable for the following components:      Result Value   CO2 21 (*)    Glucose, Bld 123 (*)    Calcium 8.8 (*)    All other components within normal limits  TROPONIN I (HIGH SENSITIVITY) - Abnormal; Notable for the following components:   Troponin I (High Sensitivity) 506 (*)    All other components within normal limits  CBC  TROPONIN I (HIGH SENSITIVITY)    EKG EKG Interpretation  Date/Time:  Friday December 10 2022 10:57:54 EDT Ventricular Rate:  77 PR Interval:  156 QRS  Duration: 89 QT Interval:  387 QTC Calculation: 438 R Axis:   -43 Text Interpretation: Sinus rhythm Left axis deviation Abnormal R-wave progression, late transition No previous ECGs available Confirmed by Fredia Sorrow [phone]) on 12/10/2022 11:02:49 AM  Radiology DG Chest Port 1 View  Result Date: 12/10/2022 CLINICAL DATA:  62 year old male presents with chest pain. EXAM: PORTABLE CHEST 1 VIEW COMPARISON:  January 03, 2007. FINDINGS: EKG leads project over the chest. Cardiomediastinal contours and hilar structures are normal accounting for AP projection. No pneumothorax. No lobar consolidation or gross evidence of pleural effusion. On limited assessment there is no acute skeletal process. IMPRESSION: No acute cardiopulmonary disease. Electronically Signed   By: Zetta Bills M.D.   On: 12/10/2022 11:38    Procedures Procedures    Medications Ordered in ED Medications  heparin bolus via infusion 4,000 Units (has no administration in time range)  heparin ADULT infusion 100 units/mL (25000 units/260mL) (has no administration in time range)  0.9 %  sodium chloride infusion (has no administration in time range)    ED Course/ Medical Decision Making/ A&P                             Medical Decision Making Amount and/or Complexity of Data Reviewed Labs: ordered. Radiology: ordered.  Risk Prescription drug management. Decision regarding hospitalization.  Show EKG without any  acute findings.  [name] history could be consistent with a pericarditis.  Troponins ordered basic labs ordered portable chest x-ray ordered.  Patient still has some slight substernal chest pain but it was significantly improved by the nitroglycerin given by EMS and he also had aspirin by EMS.  [name] basic metabolic panel without any significant abnormalities.  Renal function good.  CBC white count normal hemoglobin 16.9.  Platelets are good.  Initial troponin is in the 500 range.  This could be consistent with a non-STEMI or could be consistent with pericarditis.  Will go ahead and start heparin drip in the meantime.  [name] pain is still significantly improved.  Will consult cardiology.  Patient most likely will require admission.  CRITICAL CARE Performed by: Fredia Sorrow Total critical care time: 40 minutes Critical care time was exclusive of separately billable procedures and treating other patients. Critical care was necessary to treat or prevent imminent or life-threatening deterioration. Critical care was time spent personally by me on the following activities: development of treatment plan with patient and/or surrogate as well as nursing, discussions with consultants, evaluation of patient's response to treatment, examination of patient, obtaining history from patient or surrogate, ordering and performing treatments and interventions, ordering and review of laboratory studies, ordering and review of radiographic studies, pulse oximetry and re-evaluation of patient's condition.   Patient has been seen by cardiology they are requesting admission by hospitalist service.  Final Clinical Impression(s) / ED Diagnoses Final diagnoses:  Precordial pain  Elevated troponin  NSTEMI (non-ST elevated myocardial infarction) [facility])    Rx / DC Orders ED Discharge Orders     None         Fredia Sorrow, MD 12/10/22 1301    Fredia Sorrow, MD 12/10/22 1418

## 2022-12-10 NOTE — H&P (Addendum)
History and Physical    Patient: [name] [id number] DOB: [birth year] DOA: 12/10/2022 DOS: the patient was seen and examined on 12/10/2022 PCP: [name], MD  Patient coming from: Home via EMS  Chief Complaint:  Chief Complaint  Patient presents with   Chest Pain   HPI: [name] is a 62 y.o. male with medical history significant of hyperlipidemia, chronic pain, anxiety, depression, arthritis, and BPH who presents with complaints of substernal chest pain which started yesterday evening around 5 PM.  He describes it as a heaviness sensation that radiated down his left arm.  Patient notes that he had eaten pizza and had a beer and question if symptoms were secondary to indigestion.  He had tried taking Alka-Seltzer without any improvement in symptoms.  Through the night he was unable to rest because of symptoms and noted that sitting up seemed to be more comfortable.  Patient reports pain to be a 5/10 on the pain scale. Family history is significant for his father having heart attack before the age of 50 and his maternal grandfather as well. He had previously been diagnosed with cholesterol issues, but states that he does not like taking medications and therefore took red yeast rice supplements instead the statin medication prescribed.  Patient has a remote history of smoking 1-2 cigarettes, but quit over 40 years ago.  He does not drink 2 beers or 2 glasses of wine on average most days.   In route with EMS patient has been given full dose aspirin and nitroglycerin tablet with relief in chest pain down to 0-1 out of 10.  In the emergency department patient was noted to be afebrile with blood pressures 125/91-144/105, and all other vital signs maintained.  Labs significant for high-sensitivity troponin 506-> 543.  Chest x-ray showed no acute abnormality. Cardiology had been consulted, but recommended medicine admission due to to patient's psychiatric history.  Patient has  been started on a heparin drip per pharmacy.  Review of Systems: As mentioned in the history of present illness. All other systems reviewed and are negative. Past Medical History:  Diagnosis Date   ADHD (attention deficit hyperactivity disorder), inattentive type    sees Dr. Celesta Aver in [place], [place]    sees Dr. Celesta Aver in [place], [place]    Arthritis    Benign enlargement of prostate    sees [facility]    Carpal tunnel syndrome on left    sees Dr. Eugene Garnet at [place] pain    sees Dr. Nicholaus Bloom (meds) and Dorathy Kinsman (accupuncture)    Depression    sees Dr. Celesta Aver   Functional impotence    Hypogonadism in male    sees Larene Beach (a [place]) at [facility]    Insomnia    Kidney stones    Sinusitis, acute 07/17/2016   Urticaria due to cold    Past Surgical History:  Procedure Laterality Date   APPENDECTOMY     CERVICAL FUSION  2008   C6-7 per Dr. Rennis Harding   COLONOSCOPY  11/06/2009   per Dr. Teena Irani, no polyps, repeat in 10 yrs    FOOT SURGERY Right    x 2   LUMBAR DISC SURGERY     L5-S1, C 6-6   Social History:  reports that he has quit smoking. He has never used smokeless tobacco. He reports French alcohol use. He reports that he does not use drugs.  Allergies  Allergen Reactions   Fire Ant Anaphylaxis    Has Epi pen Has Epi pen    Codeine     REACTION: ITCHING   Cyclobenzaprine Other (See Comments)    Nightmares   Docusate Sodium     swelling   Dulcolax Stool Softener [Dss]     swelling   Gabapentin     migraine   Ketorolac     Other reaction(s): Vomiting (intolerance)   Ketorolac Tromethamine Other (See Comments)    REACTION: SEVERE VOMMITING   Lisinopril     Cough & chest tightness   Methadone Other (See Comments)    Loss of appetite   Pregabalin     headaches Other reaction(s): Confusion (intolerance)    Family History  Problem Relation Age of Onset   Heart disease Other    Diabetes  Other    Hyperlipidemia Other    Hypertension Sister    Breast cancer Sister    Heart disease Father    Diabetes Father    Heart attack Father        x 2   Prostate cancer Neg Hx    Kidney disease Neg Hx     Prior to Admission medications   Medication Sig Start Date End Date Taking? Authorizing Provider  acyclovir (ZOVIRAX) 800 MG tablet Take 1 tablet (800 mg total) by mouth 2 (two) times daily. 11/13/21   [name], MD  amLODipine (NORVASC) 10 MG tablet TAKE 1 TABLET BY MOUTH DAILY 04/13/22   [name], MD  benzonatate (TESSALON) 100 MG capsule Take 1 capsule (100 mg total) by mouth 3 (three) times daily as needed for cough. 08/27/22   Sharion Balloon, NP  cetirizine (ZYRTEC) 10 MG tablet Take 10 mg by mouth daily.    [provider]  diazepam (VALIUM) 10 MG tablet Take 10 mg by mouth every 6 (six) hours as needed.    [provider]  diclofenac sodium (VOLTAREN) 1 % GEL Apply 1 application topically as needed.    [provider]  EPINEPHrine 0.3 mg/0.3 mL IJ SOAJ injection USE AS DIRECTED 09/03/22   [name], MD  escitalopram (LEXAPRO) 5 MG tablet Take 5 mg by mouth daily. 05/17/22   [provider]  fentaNYL (DURAGESIC - DOSED MCG/HR) 50 MCG/HR Place 50 mcg onto the skin every other day.    [provider]  fluticasone Asencion Islam) 50 MCG/ACT nasal spray USE TWO SPRAYS IN [facility] NOSTRIL DAILY 05/06/20   [name], MD  hydrocortisone-pramoxine (PROCTOFOAM [facility]) rectal foam USE AS DIRECTED 3 TIMES A DAY AS NEEDED 10/16/19   [name], MD  hydrocortisone-pramoxine [facility]) rectal foam Place 1 applicator rectally 2 (two) times daily.    [provider]  ketoconazole (NIZORAL) 200 MG tablet Take 1 tablet (200 mg total) by mouth daily. 11/08/16   [name], MD  lactulose [facility]) 10 GM/15ML solution  12/08/07   [provider]  oxyCODONE (OXY IR/ROXICODONE) 5 MG immediate release tablet TAKE 1 TABLET BY  MOUTH EVERY SIX HOURS AS NEEDED FOR PAIN 08/25/16   [provider]  promethazine (PHENERGAN) 25 MG tablet Reported on 10/30/2015 09/06/09   [provider]  risperiDONE (RISPERDAL) 0.5 MG tablet Take by mouth. 06/04/22   [provider]  rosuvastatin (CRESTOR) 10 MG tablet Take 1 tablet (10 mg total) by mouth daily. 11/12/21   [name], MD  traZODone (DESYREL) 100 MG tablet Take 50 mg by mouth at  bedtime.    [provider]  triamcinolone cream (KENALOG) 0.1 % Apply 1 application topically 2 (two) times daily. 09/10/15   Dorothyann Peng, NP    Physical Exam: Vitals:   12/10/22 1300 12/10/22 1315 12/10/22 1330 12/10/22 1345  BP: (!) 138/102 (!) 143/101 (!) 144/105 (!) 142/98  Pulse: 83 83 77 83  Resp: 16 17 (!) 21 (!) 21  Temp:      TempSrc:      SpO2: 98% 98% 99% 99%  Weight:      Height:       Exam  Constitutional: Older adult male currently in no acute distress Eyes: PERRL, lids and conjunctivae normal ENMT: Mucous membranes are moist.   Neck: normal, supple  Respiratory: clear to auscultation bilaterally, no wheezing, no crackles. Normal respiratory effort.   Cardiovascular: Regular rate and rhythm, no murmurs / rubs / gallops. No extremity edema.   Abdomen: no tenderness, no masses palpated.  Bowel sounds positive.  Musculoskeletal: no clubbing / cyanosis. No joint deformity upper and lower extremities. Good ROM, no contractures. Normal muscle tone.  Skin: no rashes, lesions, ulcers. No induration Neurologic: CN 2-12 grossly intact. . Strength 5/5 in all 4.  Psychiatric: Normal judgment and insight. Alert and oriented x 3. Normal mood.   Data Reviewed:  EKG revealed sinus rhythm at 77 bpm without significant ischemic changes.  Reviewed labs, imaging, and pertinent records as noted above in HPI. Assessment and Plan:  NSTEMI Patient presented with complaints of substernal chest pain that was described as pressure with radiation down the  left arm.  High-sensitivity troponins 506-> 543.  EKG without significant ischemic changes.  Chest pain symptoms resolved with aspirin and nitroglycerin.  Patient has been started on heparin drip. -Admit to a cardiac telemetry bed -Continue heparin drip per pharmacy -N.p.o. for possible need to be taken to the Cath Lab today -Continue to trend cardiac troponin -Nitroglycerin as needed for chest pain -Check echocardiogram  -Cardiology consulted, will follow-up for any further recommendations  Essential hypertension Blood pressures elevated up to 144/105 on admission. -Resume home medications when medically appropriate  Hyperlipidemia Last lipid panel noted total cholesterol 286, HDL 59.9, LDL 203, triglycerides 112, and VLDL 22.4 from 11/09/2021.  Patient had been previously prescribed statin but did not take the medication as he does not like taking medications and had been taking red yeast rice supplements instead. -Patient would like to discuss medication that is not a statin  Anxiety and depression -Resume home medication regimen once able   Chronic pain Patient reports having chronic pain secondary to history of multiple back surgeries and ankle surgery. -Resume home pain regimen once med reconciliation completed  Alcohol use Patient reports drinking 2 beers or 2 glasses of wine on a regular basis.  Denies any previous history of withdrawals.  Wife present at bedside and confirms patient use. -Consider need of initiating CIWA protocols if any signs of alcohol withdrawals become present  DVT prophylaxis: Heparin Advance Care Planning:   Code Status: Full Code   Consults: Cardiology  Family Communication: Wife updated at bedside  Severity of Illness: The appropriate patient status for this patient is OBSERVATION. Observation status is judged to be reasonable and necessary in order to provide the required intensity of service to ensure the patient's safety. The patient's  presenting symptoms, physical exam findings, and initial radiographic and laboratory data in the context of their medical condition is felt to place them at decreased risk for further clinical deterioration. Furthermore,  it is anticipated that the patient will be medically stable for discharge from the hospital within 2 midnights of admission.   Author: Norval Morton, MD 12/10/2022 2:30 PM  For on call review [email or web address].

## 2022-12-10 NOTE — Consult Note (Addendum)
-   Cardiology Consultation   Patient ID: [name] MRN: [id number]; DOB: Feb 20, 1961  Admit date: 12/10/2022 Date of Consult: 12/10/2022  PCP:  [name], MD   [name] Providers Cardiologist:  None        Patient Profile:   [name] is a 62 y.o. male with a hx of chronic pain syndrome, HTN, kidney stones, HLD, anxiety, and ADHD who is being seen 12/10/2022 for the evaluation of chest pain at the request of Dr. Rogene Houston.  History of Present Illness:   [name] has with no previous cardiac history and no history of prior chest pain.  He is primarily seen for his chronic pain. He is being seen today for substernal chest pain that occurred during a meeting at 1700 yesterday on 12/09/2022.  He describes this as 5/10 pain that feels more like chest heaviness that radiated down his left arm and caused him numbness and tingling.  He had pizza and beer that night and thought it was GERD so he had an antacid with no relief.  He states sometimes sitting up relieves the pain and laying on his left side exacerbates the pain. He woke up today with increased symptoms of pain, new onset of mild shortness of breath, and nausea and called EMS.  En route EMS gave him aspirin 324mg  and nitroglycerin which she found relief from.  Now stating the pain is 0-1 out of 10.  He has family history of a father who has had 2 MIs.  He has 1 year history of smoking cigarettes 50+ years ago where he was smoking 1 to 2 cigarettes a day.  Due to his chronic pain and multiple surgeries he has not been able to work and has been generally inactive for many years.  He sees his PCP annually for hypertension and hyperlipidemia.  His last cholesterol was 203.  Blood pressure generally is well-controlled with just amlodipine.  He denies any fever, recent illnesses, cough, shortness of breath, leg swelling.  ED workup: EKG shows Normal sinus rhythm with no ischemic changes. Vital signs stable.   First troponin 506, negative chest x-ray. He has been started on heparin drip. He is not tachycardic, tachypneic or hypoxic.   Past Medical History:  Diagnosis Date   ADHD (attention deficit hyperactivity disorder), inattentive type    sees Dr. Celesta Aver in [place], [name]    sees Dr. Celesta Aver in [place], [place]    Arthritis    Benign enlargement of prostate    sees [facility]    Carpal tunnel syndrome on left    sees Dr. Eugene Garnet at [facility]    sees Dr. Nicholaus Bloom (meds) and Dorathy Kinsman (accupuncture)    Depression    sees Dr. Celesta Aver   Functional impotence    Hypogonadism in male    sees Larene Beach (a [place]) at [facility]    Insomnia    Kidney stones    Sinusitis, acute 07/17/2016   Urticaria due to cold     Past Surgical History:  Procedure Laterality Date   APPENDECTOMY     CERVICAL FUSION  2008   C6-7 per Dr. Rennis Callaghan [place]   COLONOSCOPY  11/06/2009   per Dr. Teena Irani, no polyps, repeat in 10 yrs    FOOT SURGERY Right    x 2   LUMBAR DISC SURGERY     L5-S1, C 6-6  Inpatient Medications: Scheduled Meds:  [START ON 12/11/2022] aspirin EC  81 mg Oral Daily   atorvastatin  80 mg Oral Daily   Continuous Infusions:  sodium chloride 100 mL/hr at 12/10/22 1318   sodium chloride     heparin 900 Units/hr (12/10/22 1315)   PRN Meds:   Allergies:    Allergies  Allergen Reactions   Fire Ant Anaphylaxis    Has Epi pen Has Epi pen    Codeine     REACTION: ITCHING   Cyclobenzaprine Other (See Comments)    Nightmares   Docusate Sodium     swelling   Dulcolax Stool Softener [Dss]     swelling   Gabapentin     migraine   Ketorolac     Other reaction(s): Vomiting (intolerance)   Ketorolac Tromethamine Other (See Comments)    REACTION: SEVERE VOMMITING   Lisinopril     Cough & chest tightness   Methadone Other (See Comments)    Loss of appetite   Pregabalin     headaches Other reaction(s): Confusion  (intolerance)    Social History:   Social History   Socioeconomic History   Marital status: Married    Spouse name: Not on file   Number of children: 0   Years of education: Not on file   Highest education level: Associate degree: occupational, Hotel manager, or vocational program  Occupational History    Comment: disability, 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  Tobacco Use   Smoking status: Former   Smokeless tobacco: Never   Tobacco comments:    quit 35 years  Substance and Sexual Activity   Alcohol use: Yes    Alcohol/week: 0.0 standard drinks of alcohol    Comment: occasional   Drug use: No   Sexual activity: Yes    Birth control/protection: Condom  Other Topics Concern   Not on file  Social History Narrative   Lives with wife   Caffeine- coffee 1-2 cups, rarely a soda     Family History:   Family History  Problem Relation Age of Onset   Heart disease Other    Diabetes Other    Hyperlipidemia Other    Hypertension Sister    Breast cancer Sister    Heart disease Father    Diabetes Father    Heart attack Father        x 2   Prostate cancer Neg Hx    Kidney disease Neg Hx      ROS:  Please see the history of present illness.   All other ROS reviewed and negative.     Physical Exam/Data:   Vitals:   12/10/22 1330 12/10/22 1345 12/10/22 1445 12/10/22 1508  BP: (!) 144/105 (!) 142/98 (!) 133/95   Pulse: 77 83 77   Resp: (!) 21 (!) 21 18   Temp:    98 F (36.7 C)  TempSrc:      SpO2: 99% 99% 97%   Weight:      Height:       No intake or output data in the 24 hours ending 12/10/22 1549    12/10/2022   10:55 AM 09/01/2022    1:46 PM 08/27/2022   12:09 PM  Last 3 Weights  Weight (lbs) 165 lb 167 lb 4 oz 167 lb  Weight (kg) 74.844 kg 75.864 kg 75.751 kg     Body mass index is 24.37 kg/m.  General:  Well nourished, well developed, in no acute distress. Anxious (extremely anxious) HEENT: normal Neck:  no JVD Vascular: No carotid bruits; Distal pulses 2+  bilaterally Cardiac:  normal S1, S2; RRR; no murmur  Lungs:  clear to auscultation bilaterally, no wheezing, rhonchi or rales  Abd: soft, nontender, no hepatomegaly  Ext: no edema Musculoskeletal:  No deformities, BUE and BLE strength normal and equal Skin: warm and dry  Neuro:  CNs 2-12 intact, no focal abnormalities noted Psych:  Normal affect   EKG:  The EKG was personally reviewed and demonstrates:  NSR with left axis deviation  Telemetry:  Telemetry was personally reviewed and demonstrates:  NSR rate of 70-80  Relevant CV Studies:   Laboratory Data:  High Sensitivity Troponin:   Recent Labs  Lab 12/10/22 1102 12/10/22 1317  TROPONINIHS 506* 543*     Chemistry Recent Labs  Lab 12/10/22 1102  NA 135  K 4.7  CL 103  CO2 21*  GLUCOSE 123*  BUN 13  CREATININE 0.99  CALCIUM 8.8*  GFRNONAA >60  ANIONGAP 11    No results for input(s): "PROT", "ALBUMIN", "AST", "ALT", "ALKPHOS", "BILITOT" in the last 168 hours. Lipids No results for input(s): "CHOL", "TRIG", "HDL", "LABVLDL", "LDLCALC", "CHOLHDL" in the last 168 hours.  Hematology Recent Labs  Lab 12/10/22 1102  WBC 7.9  RBC 5.43  HGB 16.9  HCT 48.8  MCV 89.9  MCH 31.1  MCHC 34.6  RDW 12.9  PLT 225   Thyroid No results for input(s): "TSH", "FREET4" in the last 168 hours.  BNPNo results for input(s): "BNP", "PROBNP" in the last 168 hours.  DDimer No results for input(s): "DDIMER" in the last 168 hours.   Radiology/Studies:  DG Chest Port 1 View  Result Date: 12/10/2022 CLINICAL DATA:  62 year old male presents with chest pain. EXAM: PORTABLE CHEST 1 VIEW COMPARISON:  January 03, 2007. FINDINGS: EKG leads project over the chest. Cardiomediastinal contours and hilar structures are normal accounting for AP projection. No pneumothorax. No lobar consolidation or gross evidence of pleural effusion. On limited assessment there is no acute skeletal process. IMPRESSION: No acute cardiopulmonary disease. Electronically  Signed   By: Zetta Bills M.D.   On: 12/10/2022 11:38     Assessment and Plan:   NSTEMI Patient with new abrupt onset of 5/10 chest pain that occurred during rest with no prior history of cardiac disease.  He does have family history significant for a father that has had 2 prior MIs.  Overall patient has minimal risk factors for cardiac disease besides his family history. He is not obese, diabetic, long term smoker, or have markedly elevated LDL.  - continue on heparin with plan for TTE and non emergent cardiac catherization. He has been NPO all day today. Hopeful to perform today going into the weekend. - given nitro for pain - continue heparin pre cath - received ASA 324mg  today, start 81mg  daily tomorrow - consider BB pending outcome of cath - add statin as below   HTN - treated with amlodipine 10 mg at home, anticipate continuing on admission when medicine team places admission orders If CAD identified, would likely add Beta Blocker  HLD - start atorvastatin 80mg  (rosuvastatin listed on old med list, pt no longer taking) - lipid panel pending  ADHD, Anxiety, Chronic pain syndrome Per preliminarily med review, not yet fully obtained. Given complexity of regimen, have requested medicine admission with cardiology to follow in consultation. => hopefully will be a short hospitalization with cath today.     Risk Assessment/Risk Scores:     TIMI Risk Score for  Unstable Angina or Non-ST Elevation MI:   The patient's TIMI risk score is 3, which indicates a 13% risk of all cause mortality, new or recurrent myocardial infarction or need for urgent revascularization in the next 14 days.      Signed, Bonnee Quin, PA-C  12/10/2022 3:49 PM    ATTENDING ATTESTATION  I have seen, examined and evaluated the patient this PM along with Lonzo Cloud, PA-C .  After reviewing all the available data and chart, we discussed the patients laboratory, study & physical findings as well as symptoms  in detail.  I agree with his findings, examination as well as impression recommendations as per our discussion.    Attending adjustments noted in italics.   The patient has minimal risk factors for CAD other than hyperlipidemia and borderline hypertension.  He is very anxious and nervous and had symptoms that were very concerning for unstable angina with chest pain beginning over last night, and began again today.  He became concerned enough and contacted EMS when he noticed the pain was radiating to his arm.  EKG benign.  Troponin is elevated.  Differential diagnosis is ACS/non-STEMI vs. Pericarditis (there is some positional aspect of CP worse with lying down & better sitting up)   Plan Cardiac Cath / PCI Today:   Shared Decision Making/Informed Consent{ The risks [stroke (1 in 1000), death (1 in 1000), kidney failure [usually temporary] (1 in 500), bleeding (1 in 200), allergic reaction [possibly serious] (1 in 200)], benefits (diagnostic support and management of coronary artery disease) and alternatives of a cardiac catheterization were discussed in detail with Mr. Muro and he is willing to proceed.    [name], MD, MS Glenetta Hew, M.D., M.S. Interventional Cardiologist  [name]  Pager # [phone] Phone # [phone] [address] [place], [place] [address]     For questions or updates, please contact [place] Please consult [email or web address] for contact info under

## 2022-12-10 NOTE — ED Notes (Signed)
ED TO INPATIENT HANDOFF REPORT  ED Nurse Name and Phone #:   S Name/Age/Gender [name] 62 y.o. male Room/Bed: 003C/003C  Code Status   Code Status: Full Code  Home/SNF/Other Home Patient oriented to: self, place, time, and situation Is this baseline? Yes   Triage Complete: Triage complete  Chief Complaint Chest pain [R07.9]  Triage Note Pt BIBA from home. Pt states he is having chest pain from last night. Pt states pain is better sitting up, pain is worse laying down and radiates to left arm. Pt received nitro en route, which helped with pain "immensely". Pt also received ASA   Pt also c/o cough and SHOB. Pt states he initially thought this was reflux.   Allergies Allergies  Allergen Reactions   Fire Ant Anaphylaxis    Has Epi pen Has Epi pen    Codeine     REACTION: ITCHING   Cyclobenzaprine Other (See Comments)    Nightmares   Docusate Sodium     swelling   Dulcolax Stool Softener [Dss]     swelling   Gabapentin     migraine   Ketorolac     Other reaction(s): Vomiting (intolerance)   Ketorolac Tromethamine Other (See Comments)    REACTION: SEVERE VOMMITING   Lisinopril     Cough & chest tightness   Methadone Other (See Comments)    Loss of appetite   Pregabalin     headaches Other reaction(s): Confusion (intolerance)    Level of Care/Admitting Diagnosis ED Disposition     ED Disposition  Admit   Condition  --   Comment  Hospital Area: [facility] [[id number]]  Level of Care: Telemetry Medical [104]  May place patient in observation at [facility] or [facility] if equivalent level of care is available:: No  Covid Evaluation: Asymptomatic - no recent exposure (last 10 days) testing not required  Diagnosis: Chest pain [id number]  Admitting Physician: Norval Morton [id number]  Attending Physician: Norval Morton [id number]          B Medical/Surgery History Past Medical History:  Diagnosis Date   ADHD  (attention deficit hyperactivity disorder), inattentive type    sees Dr. Celesta Aver in [place], [place]    sees Dr. Celesta Aver in [place], [place]    Arthritis    Benign enlargement of prostate    sees [facility]    Carpal tunnel syndrome on left    sees Dr. Eugene Garnet at [facility]    sees Dr. Nicholaus Bloom (meds) and Dorathy Kinsman (accupuncture)    Depression    sees Dr. Celesta Aver   Functional impotence    Hypogonadism in male    sees Larene Beach (a [place]) at [facility]    Insomnia    Kidney stones    Sinusitis, acute 07/17/2016   Urticaria due to cold    Past Surgical History:  Procedure Laterality Date   APPENDECTOMY     CERVICAL FUSION  2008   C6-7 per Dr. Rennis Harding   COLONOSCOPY  11/06/2009   per Dr. Teena Irani, no polyps, repeat in 10 yrs    FOOT SURGERY Right    x 2   LUMBAR DISC SURGERY     L5-S1, C 6-6     A IV Location/Drains/Wounds Patient Lines/Drains/Airways Status     Active Line/Drains/Airways     Name Placement date Placement time Site Days   Peripheral IV 12/10/22 18  G Left Antecubital 12/10/22  --  Antecubital  less than 1   Peripheral IV 12/10/22 20 G Posterior;Right Wrist 12/10/22  1309  Wrist  less than 1            Intake/Output Last 24 hours No intake or output data in the 24 hours ending 12/10/22 1508  Labs/Imaging Results for orders placed or performed during the hospital encounter of 12/10/22 (from the past 48 hour(s))  Basic metabolic panel     Status: Abnormal   Collection Time: 12/10/22 11:02 AM  Result Value Ref Range   Sodium 135 135 - 145 mmol/L   Potassium 4.7 3.5 - 5.1 mmol/L    Comment: HEMOLYSIS AT THIS LEVEL MAY AFFECT RESULT   Chloride 103 98 - 111 mmol/L   CO2 21 (L) 22 - 32 mmol/L   Glucose, Bld 123 (H) 70 - 99 mg/dL    Comment: Glucose reference range applies only to samples taken after fasting for at least 8 hours.   BUN 13 6 - 20 mg/dL   Creatinine, Ser 0.99 0.61 - 1.24  mg/dL   Calcium 8.8 (L) 8.9 - 10.3 mg/dL   GFR, Estimated >60 >60 mL/min    Comment: (NOTE) Calculated using the CKD-EPI Creatinine Equation (2021)    Anion gap 11 5 - 15    Comment: Performed at [facility] [address] [place] [address]  CBC     Status: None   Collection Time: 12/10/22 11:02 AM  Result Value Ref Range   WBC 7.9 4.0 - 10.5 K/uL   RBC 5.43 4.22 - 5.81 MIL/uL   Hemoglobin 16.9 13.0 - 17.0 g/dL   HCT 48.8 39.0 - 52.0 %   MCV 89.9 80.0 - 100.0 fL   MCH 31.1 26.0 - 34.0 pg   MCHC 34.6 30.0 - 36.0 g/dL   RDW 12.9 11.5 - 15.5 %   Platelets 225 150 - 400 K/uL   nRBC 0.0 0.0 - 0.2 %    Comment: Performed at [facility], [place] [address] [place], [place] [address]  Troponin I (High Sensitivity)     Status: Abnormal   Collection Time: 12/10/22 11:02 AM  Result Value Ref Range   Troponin I (High Sensitivity) 506 (HH) <18 ng/L    Comment: CRITICAL RESULT CALLED TO, READ BACK BY AND VERIFIED WITH L,Angelissa Supan RN @1243  12/10/22 E,BENTON (NOTE) Elevated high sensitivity troponin I (hsTnI) values and significant  changes across serial measurements may suggest ACS but many other  chronic and acute conditions are known to elevate hsTnI results.  Refer to the "Links" section for chest pain algorithms and additional  guidance. Performed at [facility], [place] [address], [place], [place] [address]   Troponin I (High Sensitivity)     Status: Abnormal   Collection Time: 12/10/22  1:17 PM  Result Value Ref Range   Troponin I (High Sensitivity) 543 (HH) <18 ng/L    Comment: CRITICAL VALUE NOTED. VALUE IS CONSISTENT WITH PREVIOUSLY REPORTED/CALLED VALUE (NOTE) Elevated high sensitivity troponin I (hsTnI) values and significant  changes across serial measurements may suggest ACS but many other  chronic and acute conditions are known to elevate hsTnI results.  Refer to the "Links" section for chest pain algorithms and additional  guidance. Performed at  [facility], [place] [address], [place],  [address]    DG Chest Port 1 View  Result Date: 12/10/2022 CLINICAL DATA:  62 year old male presents with chest  pain. EXAM: PORTABLE CHEST 1 VIEW COMPARISON:  January 03, 2007. FINDINGS: EKG leads project over the chest. Cardiomediastinal contours and hilar structures are normal accounting for AP projection. No pneumothorax. No lobar consolidation or gross evidence of pleural effusion. On limited assessment there is no acute skeletal process. IMPRESSION: No acute cardiopulmonary disease. Electronically Signed   By: Zetta Bills M.D.   On: 12/10/2022 11:38    Pending Labs Unresulted Labs (From admission, onward)     Start     Ordered   12/11/22 0500  Heparin level (unfractionated)  Daily,   R     See Hyperspace for full Linked Orders Report.   12/10/22 1301   12/11/22 0500  CBC  Daily,   R     See Hyperspace for full Linked Orders Report.   12/10/22 1301   12/10/22 2000  Heparin level (unfractionated)  Once-Timed,   URGENT        12/10/22 1301   12/10/22 1503  Lipid panel  Once,   R        12/10/22 1502   12/10/22 1500  HIV Antibody (routine testing w rflx)  (HIV Antibody (Routine testing w reflex) panel)  Once,   R        12/10/22 1500            Vitals/Pain Today's Vitals   12/10/22 1330 12/10/22 1345 12/10/22 1445 12/10/22 1508  BP: (!) 144/105 (!) 142/98 (!) 133/95   Pulse: 77 83 77   Resp: (!) 21 (!) 21 18   Temp:    98 F (36.7 C)  TempSrc:      SpO2: 99% 99% 97%   Weight:      Height:      PainSc:        Isolation Precautions No active isolations  Medications Medications  heparin ADULT infusion 100 units/mL (25000 units/233mL) (900 Units/hr Intravenous New Bag/Given 12/10/22 1315)  0.9 %  sodium chloride infusion ( Intravenous New Bag/Given 12/10/22 1318)  acetaminophen (TYLENOL) tablet 650 mg (has no administration in time range)  ondansetron (ZOFRAN) injection 4 mg (has no administration in time range)   nitroGLYCERIN (NITROSTAT) SL tablet 0.4 mg (has no administration in time range)  heparin bolus via infusion 4,000 Units (4,000 Units Intravenous Bolus from Bag 12/10/22 1317)    Mobility walks     Focused Assessments Cardiac Assessment Handoff:    No results found for: "CKTOTAL", "CKMB", "CKMBINDEX", "TROPONINI" No results found for: "DDIMER" Does the Patient currently have chest pain? Yes    R Recommendations: See Admitting Provider Note  Report given to:   Additional Notes:

## 2022-12-10 NOTE — ED Triage Notes (Addendum)
Pt BIBA from home. Pt states he is having chest pain from last night. Pt states pain is better sitting up, pain is worse laying down and radiates to left arm. Pt received nitro en route, which helped with pain "immensely". Pt also received ASA   Pt also c/o cough and SHOB. Pt states he initially thought this was reflux.

## 2022-12-11 ENCOUNTER — Telehealth: Payer: Self-pay | Admitting: Physician Assistant

## 2022-12-11 ENCOUNTER — Observation Stay (HOSPITAL_BASED_OUTPATIENT_CLINIC_OR_DEPARTMENT_OTHER): Payer: PPO

## 2022-12-11 DIAGNOSIS — Z79899 Other long term (current) drug therapy: Secondary | ICD-10-CM | POA: Diagnosis not present

## 2022-12-11 DIAGNOSIS — E782 Mixed hyperlipidemia: Secondary | ICD-10-CM | POA: Diagnosis not present

## 2022-12-11 DIAGNOSIS — F419 Anxiety disorder, unspecified: Secondary | ICD-10-CM | POA: Diagnosis not present

## 2022-12-11 DIAGNOSIS — R079 Chest pain, unspecified: Secondary | ICD-10-CM

## 2022-12-11 DIAGNOSIS — F32A Depression, unspecified: Secondary | ICD-10-CM | POA: Diagnosis not present

## 2022-12-11 DIAGNOSIS — G894 Chronic pain syndrome: Secondary | ICD-10-CM | POA: Diagnosis not present

## 2022-12-11 DIAGNOSIS — R7989 Other specified abnormal findings of blood chemistry: Secondary | ICD-10-CM | POA: Diagnosis not present

## 2022-12-11 DIAGNOSIS — I10 Essential (primary) hypertension: Secondary | ICD-10-CM | POA: Diagnosis not present

## 2022-12-11 DIAGNOSIS — I214 Non-ST elevation (NSTEMI) myocardial infarction: Secondary | ICD-10-CM | POA: Diagnosis not present

## 2022-12-11 DIAGNOSIS — Z87891 Personal history of nicotine dependence: Secondary | ICD-10-CM | POA: Diagnosis not present

## 2022-12-11 DIAGNOSIS — E785 Hyperlipidemia, unspecified: Secondary | ICD-10-CM | POA: Diagnosis not present

## 2022-12-11 HISTORY — PX: TRANSTHORACIC ECHOCARDIOGRAM: SHX275

## 2022-12-11 LAB — CBC
HCT: 47.5 % (ref 39.0–52.0)
Hemoglobin: 15.9 g/dL (ref 13.0–17.0)
MCH: 30.3 pg (ref 26.0–34.0)
MCHC: 33.5 g/dL (ref 30.0–36.0)
MCV: 90.5 fL (ref 80.0–100.0)
Platelets: 195 10*3/uL (ref 150–400)
RBC: 5.25 MIL/uL (ref 4.22–5.81)
RDW: 12.8 % (ref 11.5–15.5)
WBC: 9.4 10*3/uL (ref 4.0–10.5)
nRBC: 0 % (ref 0.0–0.2)

## 2022-12-11 LAB — ECHOCARDIOGRAM COMPLETE
AR max vel: 2.51 cm2
AV Area VTI: 2.58 cm2
AV Area mean vel: 2.35 cm2
AV Mean grad: 4 mmHg
AV Peak grad: 7.2 mmHg
Ao pk vel: 1.34 m/s
Area-P 1/2: 3.66 cm2
Height: 69 in
S' Lateral: 2.7 cm
Single Plane A4C EF: 51.9 %
Weight: 2640 oz

## 2022-12-11 LAB — BASIC METABOLIC PANEL
Anion gap: 10 (ref 5–15)
BUN: 10 mg/dL (ref 6–20)
CO2: 23 mmol/L (ref 22–32)
Calcium: 8.5 mg/dL — ABNORMAL LOW (ref 8.9–10.3)
Chloride: 100 mmol/L (ref 98–111)
Creatinine, Ser: 0.97 mg/dL (ref 0.61–1.24)
GFR, Estimated: >60 mL/min (ref >60)
Glucose, Bld: 118 mg/dL — ABNORMAL HIGH (ref 70–99)
Potassium: 3.5 mmol/L (ref 3.5–5.1)
Sodium: 133 mmol/L — ABNORMAL LOW (ref 135–145)

## 2022-12-11 MED ORDER — METOPROLOL SUCCINATE ER 25 MG PO TB24
25.0000 mg | ORAL_TABLET | Freq: Every day | ORAL | Status: DC
  Administered 2022-12-11: 25 mg via ORAL
  Filled 2022-12-11: qty 1

## 2022-12-11 MED ORDER — ATORVASTATIN CALCIUM 80 MG PO TABS: 80.0000 mg | ORAL_TABLET | Freq: Every day | ORAL | 2 refills | Status: DC

## 2022-12-11 MED ORDER — NITROGLYCERIN 0.4 MG SL SUBL: 0.4000 mg | SUBLINGUAL_TABLET | SUBLINGUAL | 3 refills | Status: DC | PRN

## 2022-12-11 MED ORDER — TICAGRELOR 90 MG PO TABS: 90.0000 mg | ORAL_TABLET | Freq: Two times a day (BID) | ORAL | 11 refills | Status: DC

## 2022-12-11 MED ORDER — FENTANYL 50 MCG/HR TD PT72
1.0000 | MEDICATED_PATCH | TRANSDERMAL | Status: DC
  Administered 2022-12-11: 1 via TRANSDERMAL
  Filled 2022-12-11: qty 1

## 2022-12-11 MED ORDER — METOPROLOL SUCCINATE ER 25 MG PO TB24: 25.0000 mg | ORAL_TABLET | Freq: Every day | ORAL | 2 refills | Status: DC

## 2022-12-11 MED ORDER — ASPIRIN 81 MG PO TBEC: 81.0000 mg | DELAYED_RELEASE_TABLET | Freq: Every day | ORAL | 12 refills | Status: AC

## 2022-12-11 NOTE — Progress Notes (Addendum)
I have sent a message to our office's scheduling team requesting a TOC follow-up appointment and TOC call, and our office will call the patient with this information.

## 2022-12-11 NOTE — Discharge Summary (Addendum)
Physician Discharge Summary  [name] [id number] DOB: 12-12-1960 DOA: 12/10/2022  PCP: Ronald Morale, MD  Admit date: 12/10/2022 Discharge date: 12/11/2022 Admitted From: Home Disposition: Home Recommendations for Outpatient Follow-up:  Outpatient follow-ups as below Check compliance, CMP and CBC at follow-up Please follow up on the following pending results: Hemoglobin A1c and lipoprotein A  Home Health: Not indicated Equipment/Devices: Not indicated  Discharge Condition: Stable CODE STATUS: Full code  Follow-up Information     Ronald Man, MD Follow up.   Specialty: Cardiology Why: [name] - cardiology office will call you to arrange post-hospital follow-up appointment. Contact information: [address] [place] [address] [phone]         Ronald Morale, MD. Schedule an appointment as soon as possible for a visit in 1 week(s).   Specialty: Family Medicine Contact information: [place] [address] [phone]                 Hospital course 62 year old M with PMH of chronic pain on opiates, prediabetes, HTN, HLD, BPH, anxiety and depression presenting with substernal pressure-like chest pain radiating down his left arm and admitted for non-STEMI.  In ED, hemodynamically stable.  High-sensitivity troponin elevated to 506 and trended up to 543.  Initial EKG sinus rhythm with abnormal R wave progression (late transition).  Repeat EKG normal sinus rhythm.  Cardiology consulted.  Patient was started on IV heparin and admitted.  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  TTE without significant finding.  LDL elevated to 160s.  Hemoglobin A1c and lipoprotein a pending.  His most recent A1c about 3 months ago was 6.0%.  Patient was started on Brilinta,  aspirin, Lipitor and Toprol-XL, and cleared for discharge by cardiology for outpatient follow-up.  Counseled on the importance of compliance with medication.  Cardiology recommended Brilinta and aspirin at least for 12 months.  Cardiology to arrange outpatient follow-up.  See individual problem list below for more.   Problems addressed during this hospitalization Principal Problem:   NSTEMI (non-ST elevated myocardial infarction) [name]) Active Problems:   Essential hypertension, benign   Hyperlipidemia, mixed   Anxiety and depression   Chronic pain              Vital signs Vitals:   12/10/22 2317 12/11/22 0543 12/11/22 1130 12/11/22 1348  BP: 127/87 (!) 119/91 (!) 131/96   Pulse:  81 85 86  Temp: 98.6 F (37 C) 98.1 F (36.7 C) 98.6 F (37 C)   Resp: 16 16 16    Height:      Weight:      SpO2: 97% 97%    TempSrc: Oral Oral Oral   BMI (Calculated):         Discharge exam  GENERAL: No apparent distress.  Nontoxic. HEENT: MMM.  Vision and hearing grossly intact.  NECK: Supple.  No apparent JVD.  RESP:  No IWOB.  Fair aeration bilaterally. CVS:  RRR. Heart sounds normal.  ABD/GI/GU: BS+. Abd soft, NTND.  MSK/EXT:  Moves extremities. No apparent deformity. No edema.  SKIN: no apparent skin lesion or wound NEURO: Awake and alert. Oriented appropriately.  No apparent focal neuro deficit. PSYCH: Calm. Normal affect.   Discharge Instructions Discharge Instructions     Amb Referral to Cardiac Rehabilitation   Complete by: As directed  Diagnosis: NSTEMI   After initial evaluation and assessments completed: Virtual Based Care may be provided alone or in conjunction with Phase 2 Cardiac Rehab based on patient barriers.: Yes   Intensive Cardiac Rehabilitation (ICR) [place] location only OR Traditional Cardiac Rehabilitation (TCR) *If criteria for ICR are not met will enroll in TCR [facility] only): Yes   Call MD for:  difficulty breathing, headache or visual disturbances    Complete by: As directed    Call MD for:  extreme fatigue   Complete by: As directed    Call MD for:  persistant dizziness or light-headedness   Complete by: As directed    Call MD for:  severe uncontrolled pain   Complete by: As directed    Diet - low sodium heart healthy   Complete by: As directed    Discharge instructions   Complete by: As directed    It has been a pleasure taking care of you!  You were hospitalized due to chest pain likely from your heart for which you have been treated with stent placement.  You have been started on medications (baby aspirin, Brilinta, Lipitor and metoprolol) to prevent further heart attack.  It is very important that you take those medications as prescribed.  We recommend you avoid any over-the-counter pain medication other than plain Tylenol.  Follow-up with your primary care doctor in 1 to 2 weeks or sooner if needed.  Follow-up with cardiologist in 2 to 4 weeks per their recommendation.   Take care,   Increase activity slowly   Complete by: As directed       Allergies as of 12/11/2022       Reactions   Fire Ant Anaphylaxis   Has Epi pen Has Epi pen   Codeine    REACTION: ITCHING   Cyclobenzaprine Other (See Comments)   Nightmares   Docusate Sodium    swelling   Dulcolax Stool Softener [dss]    swelling   Gabapentin    migraine   Ketorolac    Other reaction(s): Vomiting (intolerance)   Ketorolac Tromethamine Other (See Comments)   REACTION: SEVERE VOMMITING   Lisinopril    Cough & chest tightness   Methadone Other (See Comments)   Loss of appetite   Pregabalin    headaches Other reaction(s): Confusion (intolerance)        Medication List     STOP taking these medications    hydrocortisone-pramoxine rectal foam Commonly known as: Proctofoam HC   ketoconazole 200 MG tablet Commonly known as: NIZORAL   rosuvastatin 10 MG tablet Commonly known as: Crestor       TAKE these medications    acyclovir 800 MG  tablet Commonly known as: ZOVIRAX Take 1 tablet (800 mg total) by mouth 2 (two) times daily. What changed:  when to take this reasons to take this   amLODipine 10 MG tablet Commonly known as: NORVASC TAKE 1 TABLET BY MOUTH DAILY What changed: when to take this   aspirin EC 81 MG tablet Take 1 tablet (81 mg total) by mouth daily. Swallow whole. Start taking on: December 12, 2022   atorvastatin 80 MG tablet Commonly known as: LIPITOR Take 1 tablet (80 mg total) by mouth daily. Start taking on: December 12, 2022   benzonatate 100 MG capsule Commonly known as: TESSALON Take 1 capsule (100 mg total) by mouth 3 (three) times daily as needed for cough.   cetirizine 10 MG tablet Commonly known as: ZYRTEC Take 10 mg by mouth  daily.   diazepam 10 MG tablet Commonly known as: VALIUM Take 10 mg by mouth 2 (two) times daily.   diclofenac sodium 1 % Gel Commonly known as: VOLTAREN Apply 1 application  topically daily as needed (For pain).   EPINEPHrine 0.3 mg/0.3 mL Soaj injection Commonly known as: EPI-PEN USE AS DIRECTED What changed:  how much to take how to take this when to take this reasons to take this   fentaNYL 50 MCG/HR Commonly known as: DURAGESIC Place 50 mcg onto the skin every other day.   fluticasone 50 MCG/ACT nasal spray Commonly known as: FLONASE USE TWO SPRAYS IN EACH NOSTRIL DAILY What changed:  how much to take when to take this reasons to take this   lactulose (encephalopathy) 10 GM/15ML Soln Commonly known as: CHRONULAC Take 20 g by mouth at bedtime.   methocarbamol 500 MG tablet Commonly known as: ROBAXIN Take 500 mg by mouth every 8 (eight) hours as needed for muscle spasms.   metoprolol succinate 25 MG 24 hr tablet Commonly known as: TOPROL-XL Take 1 tablet (25 mg total) by mouth daily.   nitroGLYCERIN 0.4 MG SL tablet Commonly known as: Nitrostat Place 1 tablet (0.4 mg total) under the tongue every 5 (five) minutes as needed for chest  pain.   oxyCODONE 5 MG immediate release tablet Commonly known as: Oxy IR/ROXICODONE Take 5 mg by mouth every 6 (six) hours as needed for moderate pain.   promethazine 25 MG tablet Commonly known as: PHENERGAN Take 12.5 mg by mouth every 6 (six) hours as needed for nausea or vomiting.   risperiDONE 0.5 MG tablet Commonly known as: RISPERDAL Take by mouth.   ticagrelor 90 MG Tabs tablet Commonly known as: BRILINTA Take 1 tablet (90 mg total) by mouth 2 (two) times daily.   traZODone 100 MG tablet Commonly known as: DESYREL Take 50 mg by mouth at bedtime.   triamcinolone cream 0.1 % Commonly known as: KENALOG Apply 1 application topically 2 (two) times daily. What changed:  when to take this reasons to take this        Consultations: Cardiology  Procedures/Studies: [place] 12/10/2022 1.  Patent left main and LAD with mild nonobstructive plaquing but no significant stenosis 2.  Moderate eccentric nonobstructive mid RCA stenosis with diffuse plaquing throughout the large dominant RCA 3.  Severe subtotal 99% stenosis of the mid circumflex with a sequential lesion into the branching obtuse marginal, treated successfully with PCI using a 2.25 x 24 mm Synergy DES 4.  Mild segmental LV contraction abnormality with mild hypokinesis of the anterolateral wall and preserved overall LVEF of 55%   ECHOCARDIOGRAM COMPLETE  Result Date: 12/11/2022    ECHOCARDIOGRAM REPORT   Patient Name:   LOCKLAN REILLEY Date of Exam: 12/11/2022 Medical Rec #:  [id number]            Height:       69.0 in Accession #:    [id number]           Weight:       165.0 lb Date of Birth:  October 28, 1960            BSA:          1.904 m Patient Age:    61 years             BP:           119/91 mmHg Patient Gender: M  HR:           81 bpm. Exam Location:  Inpatient Procedure: 2D Echo, Cardiac Doppler and Color Doppler Indications:    Chest Pain  History:        Patient has no prior history of  Echocardiogram examinations.                 Signs/Symptoms:Chest Pain; Risk Factors:Hypertension.  Sonographer:    Marella Chimes Referring Phys: Norval Morton IMPRESSIONS  1. Left ventricular ejection fraction, by estimation, is 60 to 65%. The left ventricle has normal function. The left ventricle has no regional wall motion abnormalities. Left ventricular diastolic parameters are consistent with Grade I diastolic dysfunction (impaired relaxation).  2. Right ventricular systolic function is normal. The right ventricular size is normal. There is normal pulmonary artery systolic pressure.  3. No evidence of mitral valve regurgitation.  4. Aortic valve regurgitation is not visualized.  5. The inferior vena cava is normal in size with greater than 50% respiratory variability, suggesting right atrial pressure of 3 mmHg. Comparison(s): No prior Echocardiogram. Conclusion(s)/Recommendation(s): Normal biventricular function without evidence of hemodynamically significant valvular heart disease. FINDINGS  Left Ventricle: Left ventricular ejection fraction, by estimation, is 60 to 65%. The left ventricle has normal function. The left ventricle has no regional wall motion abnormalities. The left ventricular internal cavity size was normal in size. There is  no left ventricular hypertrophy. Left ventricular diastolic parameters are consistent with Grade I diastolic dysfunction (impaired relaxation). Right Ventricle: The right ventricular size is normal. Right ventricular systolic function is normal. There is normal pulmonary artery systolic pressure. The tricuspid regurgitant velocity is 1.96 m/s, and with an assumed right atrial pressure of 3 mmHg,  the estimated right ventricular systolic pressure is [id number] mmHg. Left Atrium: Left atrial size was normal in size. Right Atrium: Right atrial size was normal in size. Pericardium: There is no evidence of pericardial effusion. Mitral Valve: No evidence of mitral valve  regurgitation. Tricuspid Valve: Tricuspid valve regurgitation is not demonstrated. Aortic Valve: Aortic valve regurgitation is not visualized. Aortic valve mean gradient measures 4.0 mmHg. Aortic valve peak gradient measures 7.2 mmHg. Aortic valve area, by VTI measures 2.58 cm. Pulmonic Valve: Pulmonic valve regurgitation is not visualized. Aorta: The aortic root and ascending aorta are structurally normal, with no evidence of dilitation. Venous: The inferior vena cava is normal in size with greater than 50% respiratory variability, suggesting right atrial pressure of 3 mmHg. IAS/Shunts: The interatrial septum was not well visualized.  LEFT VENTRICLE PLAX 2D LVIDd:         4.10 cm     Diastology LVIDs:         2.70 cm     LV e' medial:    5.00 cm/s LV PW:         0.95 cm     LV E/e' medial:  12.2 LV IVS:        0.83 cm     LV e' lateral:   6.85 cm/s LVOT diam:     2.20 cm     LV E/e' lateral: 8.9 LV SV:         63 LV SV Index:   33 LVOT Area:     3.80 cm  LV Volumes (MOD) LV vol d, MOD A4C: 59.0 ml LV vol s, MOD A4C: 28.4 ml LV SV MOD A4C:     59.0 ml RIGHT VENTRICLE RV S prime:     12.80 cm/s TAPSE (M-mode):  2.0 cm LEFT ATRIUM             Index        RIGHT ATRIUM           Index LA diam:        3.50 cm 1.84 cm/m   RA Area:     10.20 cm LA Vol (A2C):   35.2 ml 18.49 ml/m  RA Volume:   18.90 ml  9.93 ml/m LA Vol (A4C):   38.2 ml 20.06 ml/m LA Biplane Vol: 37.4 ml 19.64 ml/m  AORTIC VALVE AV Area (Vmax):    2.51 cm AV Area (Vmean):   2.35 cm AV Area (VTI):     2.58 cm AV Vmax:           134.00 cm/s AV Vmean:          88.700 cm/s AV VTI:            0.243 m AV Peak Grad:      7.2 mmHg AV Mean Grad:      4.0 mmHg LVOT Vmax:         88.60 cm/s LVOT Vmean:        54.900 cm/s LVOT VTI:          0.165 m LVOT/AV VTI ratio: 0.68  AORTA Ao Root diam: 3.00 cm Ao Asc diam:  3.10 cm MITRAL VALVE               TRICUSPID VALVE MV Area (PHT): 3.66 cm    TR Peak grad:   15.4 mmHg MV Decel Time: 207 msec    TR Vmax:         196.00 cm/s MV E velocity: 60.80 cm/s MV A velocity: 78.10 cm/s  SHUNTS MV E/A ratio:  0.78        Systemic VTI:  0.16 m                            Systemic Diam: 2.20 cm Phineas Inches Electronically signed by Phineas Inches Signature Date/Time: 12/11/2022/12:42:17 PM    Final    CARDIAC CATHETERIZATION  Result Date: 12/10/2022 1.  Patent left main and LAD with mild nonobstructive plaquing but no significant stenosis 2.  Moderate eccentric nonobstructive mid RCA stenosis with diffuse plaquing throughout the large dominant RCA 3.  Severe subtotal 99% stenosis of the mid circumflex with a sequential lesion into the branching obtuse marginal, treated successfully with PCI using a 2.25 x 24 mm Synergy DES 4.  Mild segmental LV contraction abnormality with mild hypokinesis of the anterolateral wall and preserved overall LVEF of 55% Recommendations: Aggressive medical therapy, DAPT with aspirin and ticagrelor at least 12 months without an interruption.  As long as no complications arise, hospital discharge tomorrow.   DG Chest Port 1 View  Result Date: 12/10/2022 CLINICAL DATA:  62 year old male presents with chest pain. EXAM: PORTABLE CHEST 1 VIEW COMPARISON:  January 03, 2007. FINDINGS: EKG leads project over the chest. Cardiomediastinal contours and hilar structures are normal accounting for AP projection. No pneumothorax. No lobar consolidation or gross evidence of pleural effusion. On limited assessment there is no acute skeletal process. IMPRESSION: No acute cardiopulmonary disease. Electronically Signed   By: Zetta Bills M.D.   On: 12/10/2022 11:38       The results of significant diagnostics from this hospitalization (including imaging, microbiology, ancillary and laboratory) are listed below for reference.     Microbiology: No  results found for this or any previous visit (from the past 240 hour(s)).   Labs:  CBC: Recent Labs  Lab 12/10/22 1102 12/11/22 0113  WBC 7.9 9.4  HGB 16.9 15.9  HCT  48.8 47.5  MCV 89.9 90.5  PLT 225 195   BMP &GFR Recent Labs  Lab 12/10/22 1102 12/11/22 0113  NA 135 133*  K 4.7 3.5  CL 103 100  CO2 21* 23  GLUCOSE 123* 118*  BUN 13 10  CREATININE 0.99 0.97  CALCIUM 8.8* 8.5*   Estimated Creatinine Clearance: 81 mL/min (by C-G formula based on SCr of 0.97 mg/dL). Liver & Pancreas: No results for input(s): "AST", "ALT", "ALKPHOS", "BILITOT", "PROT", "ALBUMIN" in the last 168 hours. No results for input(s): "LIPASE", "AMYLASE" in the last 168 hours. No results for input(s): "AMMONIA" in the last 168 hours. Diabetic: No results for input(s): "HGBA1C" in the last 72 hours. No results for input(s): "GLUCAP" in the last 168 hours. Cardiac Enzymes: No results for input(s): "CKTOTAL", "CKMB", "CKMBINDEX", "TROPONINI" in the last 168 hours. No results for input(s): "PROBNP" in the last 8760 hours. Coagulation Profile: No results for input(s): "INR", "PROTIME" in the last 168 hours. Thyroid Function Tests: No results for input(s): "TSH", "T4TOTAL", "FREET4", "T3FREE", "THYROIDAB" in the last 72 hours. Lipid Profile: Recent Labs    12/10/22 1532  CHOL 238*  HDL 61  LDLCALC 163*  TRIG 71  CHOLHDL 3.9   Anemia Panel: No results for input(s): "VITAMINB12", "FOLATE", "FERRITIN", "TIBC", "IRON", "RETICCTPCT" in the last 72 hours. Urine analysis:    Component Value Date/Time   COLORURINE yellow 07/01/2010 1403   APPEARANCEUR Clear 07/01/2010 1403   LABSPEC >=1.030 07/01/2010 1403   PHURINE 5.0 07/01/2010 1403   HGBUR negative 07/01/2010 1403   BILIRUBINUR neg 12/22/2020 1445   PROTEINUR Negative 12/22/2020 1445   UROBILINOGEN 0.2 12/22/2020 1445   UROBILINOGEN 0.2 07/01/2010 1403   NITRITE neg 12/22/2020 1445   NITRITE negative 07/01/2010 1403   LEUKOCYTESUR Negative 12/22/2020 1445   Sepsis Labs: Invalid input(s): "PROCALCITONIN", "LACTICIDVEN"   SIGNED:  Mercy Riding, MD  [facility] 12/11/2022, 1:54 PM

## 2022-12-11 NOTE — Progress Notes (Signed)
CARDIAC REHAB PHASE I   PRE:  Rate/Rhythm: NSR 95  BP:  Sitting: 145/104      SaO2: 95% RA  MODE:  Ambulation: 470 ft   AD:  None  POST:  Rate/Rhythm: NSR 96  BP:  Sitting: 143/95      SaO2: 96  Pt amb independently, pt denies CP and SOB during amb and was returned to room w/o complaint. Post-ambulation RPE 11 (scale 6-20). Pt c/o mild chest tightness while walking.   Janine Ores, RN  9:57 AM 12/11/2022    Service time is from [place] to 0945.

## 2022-12-11 NOTE — Plan of Care (Signed)
  Problem: Education: Goal: Understanding of cardiac disease, CV risk reduction, and recovery process will improve Outcome: Adequate for Discharge Goal: Individualized Educational Video(s) Outcome: Adequate for Discharge   Problem: Activity: Goal: Ability to tolerate increased activity will improve Outcome: Adequate for Discharge   Problem: Cardiac: Goal: Ability to achieve and maintain adequate cardiovascular perfusion will improve Outcome: Adequate for Discharge   Problem: Health Behavior/Discharge Planning: Goal: Ability to safely manage health-related needs after discharge will improve Outcome: Adequate for Discharge   Problem: Education: Goal: Understanding of CV disease, CV risk reduction, and recovery process will improve Outcome: Adequate for Discharge Goal: Individualized Educational Video(s) Outcome: Adequate for Discharge   Problem: Activity: Goal: Ability to return to baseline activity level will improve Outcome: Adequate for Discharge   Problem: Cardiovascular: Goal: Ability to achieve and maintain adequate cardiovascular perfusion will improve Outcome: Adequate for Discharge Goal: Vascular access site(s) Level 0-1 will be maintained Outcome: Adequate for Discharge   Problem: Health Behavior/Discharge Planning: Goal: Ability to safely manage health-related needs after discharge will improve Outcome: Adequate for Discharge   Problem: Education: Goal: Knowledge of General Education information will improve Description: Including pain rating scale, medication(s)/side effects and non-pharmacologic comfort measures Outcome: Adequate for Discharge   Problem: Health Behavior/Discharge Planning: Goal: Ability to manage health-related needs will improve Outcome: Adequate for Discharge   Problem: Clinical Measurements: Goal: Ability to maintain clinical measurements within normal limits will improve Outcome: Adequate for Discharge Goal: Will remain free from  infection Outcome: Adequate for Discharge Goal: Diagnostic test results will improve Outcome: Adequate for Discharge Goal: Respiratory complications will improve Outcome: Adequate for Discharge Goal: Cardiovascular complication will be avoided Outcome: Adequate for Discharge   Problem: Activity: Goal: Risk for activity intolerance will decrease Outcome: Adequate for Discharge   Problem: Nutrition: Goal: Adequate nutrition will be maintained Outcome: Adequate for Discharge   Problem: Coping: Goal: Level of anxiety will decrease Outcome: Adequate for Discharge   Problem: Elimination: Goal: Will not experience complications related to bowel motility Outcome: Adequate for Discharge Goal: Will not experience complications related to urinary retention Outcome: Adequate for Discharge   Problem: Pain Managment: Goal: General experience of comfort will improve Outcome: Adequate for Discharge   Problem: Safety: Goal: Ability to remain free from injury will improve Outcome: Adequate for Discharge   Problem: Skin Integrity: Goal: Risk for impaired skin integrity will decrease Outcome: Adequate for Discharge   

## 2022-12-11 NOTE — Telephone Encounter (Addendum)
   Attention TOC pool,  This patient will need a TOC phone call after discharge. They are being discharged . Follow-up appointment is pending - sent request to scheduling inbox to call pt with appt info for 2 week f/u They are a patient of Dr. Ellyn Hack.  Thank you! Charlie Pitter, PA-C

## 2022-12-11 NOTE — Progress Notes (Signed)
  Echocardiogram 2D Echocardiogram has been performed.  [name] 12/11/2022, 8:41 AM

## 2022-12-11 NOTE — Progress Notes (Signed)
Rounding Note    Patient Name: [name] Date of Encounter: 12/11/2022  [place] Cardiologist: None , Dr. Glenetta Hew  Subjective   He feels well, no CP or SOB. No bleeding. Crt nl. Hgb nl  Inpatient Medications    Scheduled Meds:  amLODipine  10 mg Oral QPM   aspirin EC  81 mg Oral Daily   atorvastatin  80 mg Oral Daily   fentaNYL  1 patch Transdermal Q48H   fluticasone  2 spray Each Nare Daily   sodium chloride flush  3 mL Intravenous Q12H   ticagrelor  90 mg Oral BID   traZODone  50 mg Oral QHS   Continuous Infusions:  sodium chloride 100 mL/hr at 12/11/22 0411   sodium chloride     PRN Meds: sodium chloride, acetaminophen, diazepam, loratadine, nitroGLYCERIN, ondansetron (ZOFRAN) IV, oxyCODONE, sodium chloride flush   Vital Signs    Vitals:   12/10/22 1800 12/10/22 2317 12/11/22 0543 12/11/22 1130  BP: (!) 126/94 127/87 (!) 119/91 (!) 131/96  Pulse: 76 97 81 85  Resp: 14 16 16 16   Temp: 98.4 F (36.9 C) 98.6 F (37 C) 98.1 F (36.7 C) 98.6 F (37 C)  TempSrc: Oral Oral Oral Oral  SpO2: 96% 97% 97%   Weight:      Height:        Intake/Output Summary (Last 24 hours) at 12/11/2022 1227 Last data filed at 12/11/2022 0411 Gross per 24 hour  Intake 1396.44 ml  Output --  Net 1396.44 ml      12/10/2022   10:55 AM 09/01/2022    1:46 PM 08/27/2022   12:09 PM  Last 3 Weights  Weight (lbs) 165 lb 167 lb 4 oz 167 lb  Weight (kg) 74.844 kg 75.864 kg 75.751 kg      Telemetry    NSR, no ectopy - Personally Reviewed  ECG    NSR, normal EKG - Personally Reviewed  Physical Exam   Vitals:   12/11/22 0543 12/11/22 1130  BP: (!) 119/91 (!) 131/96  Pulse: 81 85  Resp: 16 16  Temp: 98.1 F (36.7 C) 98.6 F (37 C)  SpO2: 97%     GEN: No acute distress.   Neck: No JVD Cardiac: RRR, no murmurs, rubs, or gallops.  Respiratory: Clear to auscultation bilaterally. VASC: 2+ radial BL, cath site clean and dry GI: Soft,  nontender, non-distended  MS: No edema; No deformity. Neuro:  Nonfocal  Psych: Normal affect   Labs    High Sensitivity Troponin:   Recent Labs  Lab 12/10/22 1102 12/10/22 1317 12/10/22 1532  TROPONINIHS 506* 543* 658*     Chemistry Recent Labs  Lab 12/10/22 1102 12/11/22 0113  NA 135 133*  K 4.7 3.5  CL 103 100  CO2 21* 23  GLUCOSE 123* 118*  BUN 13 10  CREATININE 0.99 0.97  CALCIUM 8.8* 8.5*  GFRNONAA >60 >60  ANIONGAP 11 10    Lipids  Recent Labs  Lab 12/10/22 1532  CHOL 238*  TRIG 71  HDL 61  LDLCALC 163*  CHOLHDL 3.9    Hematology Recent Labs  Lab 12/10/22 1102 12/11/22 0113  WBC 7.9 9.4  RBC 5.43 5.25  HGB 16.9 15.9  HCT 48.8 47.5  MCV 89.9 90.5  MCH 31.1 30.3  MCHC 34.6 33.5  RDW 12.9 12.8  PLT 225 195   Thyroid No results for input(s): "TSH", "FREET4" in the last 168 hours.  BNPNo results for input(s): "  BNP", "PROBNP" in the last 168 hours.  DDimer No results for input(s): "DDIMER" in the last 168 hours.   Radiology    CARDIAC CATHETERIZATION  Result Date: 12/10/2022 1.  Patent left main and LAD with mild nonobstructive plaquing but no significant stenosis 2.  Moderate eccentric nonobstructive mid RCA stenosis with diffuse plaquing throughout the large dominant RCA 3.  Severe subtotal 99% stenosis of the mid circumflex with a sequential lesion into the branching obtuse marginal, treated successfully with PCI using a 2.25 x 24 mm Synergy DES 4.  Mild segmental LV contraction abnormality with mild hypokinesis of the anterolateral wall and preserved overall LVEF of 55% Recommendations: Aggressive medical therapy, DAPT with aspirin and ticagrelor at least 12 months without an interruption.  As long as no complications arise, hospital discharge tomorrow.   DG Chest Port 1 View  Result Date: 12/10/2022 CLINICAL DATA:  62 year old male presents with chest pain. EXAM: PORTABLE CHEST 1 VIEW COMPARISON:  January 03, 2007. FINDINGS: EKG leads project over  the chest. Cardiomediastinal contours and hilar structures are normal accounting for AP projection. No pneumothorax. No lobar consolidation or gross evidence of pleural effusion. On limited assessment there is no acute skeletal process. IMPRESSION: No acute cardiopulmonary disease. Electronically Signed   By: Zetta Bills M.D.   On: 12/10/2022 11:38    Cardiac Studies  LHC 12/10/2022 1.  Patent left main and LAD with mild nonobstructive plaquing but no significant stenosis 2.  Moderate eccentric nonobstructive mid RCA stenosis with diffuse plaquing throughout the large dominant RCA 3.  Severe subtotal 99% stenosis of the mid circumflex with a sequential lesion into the branching obtuse marginal, treated successfully with PCI using a 2.25 x 24 mm Synergy DES 4.  Mild segmental LV contraction abnormality with mild hypokinesis of the anterolateral wall and preserved overall LVEF of 55%  TTE: No significant LV dysfunction or valvular abnormalities  Patient Profile     [name] is a 62 y.o. male with a hx of chronic pain syndrome, HTN, kidney stones, HLD, anxiety, and ADHD who is being seen 12/10/2022 for the evaluation of chest pain at the request of Dr. Rogene Houston found to have 99% LCx  Assessment & Plan    NSTEMI -s/p PCI mid Lcx 99% lesion. - continue DAPT ( asa and ticagrelor) for 12 months  - continue lipitor 80 mg daily; LDL 163 mg/dL; may need PCSK9 in the future - start BB metoprolol 25 mg XL - LP(a) in process -A1c in process - nitro SL PRN  HTN - continue norvasc 10 mg daily  He can be discharged from a cardiology standpoint. Working on [employer] appointment.  For questions or updates, please contact [place] Please consult [email or web address] for contact info under        Signed, Janina Mayo, MD  12/11/2022, 12:27 PM

## 2022-12-12 LAB — LIPOPROTEIN A (LPA): Lipoprotein (a): 32.9 nmol/L — ABNORMAL HIGH (ref <75.0)

## 2022-12-13 ENCOUNTER — Telehealth: Payer: Self-pay | Admitting: Cardiology

## 2022-12-13 ENCOUNTER — Encounter (HOSPITAL_COMMUNITY): Payer: Self-pay | Admitting: Cardiovascular Disease

## 2022-12-13 LAB — HEMOGLOBIN A1C
Hgb A1c MFr Bld: 5.9 % — ABNORMAL HIGH (ref 4.8–5.6)
Mean Plasma Glucose: 123 mg/dL

## 2022-12-13 NOTE — Telephone Encounter (Signed)
Indeed, he should take metoprolol and amlodipine.  That was the plan.  Agree with taking Brilinta with caffeine.  Hard to tell what his symptoms are.  Had pretty good results of his cardiac catheterization.  We can probably give him prescription for sublingual nitroglycerin, if not already written.  Glenetta Hew, MD

## 2022-12-13 NOTE — Telephone Encounter (Signed)
Called patient, advised that he has been having soreness in his chest- recently was in hospital for NSTEMI- had heart cath- was discharged on 03/16 patient aware of upcoming appointment with [place] 03/29 with Jory Sims, NP at 2:20 PM. Patient states he is not sure when he should call or not. Advised for him to check his BP/HR at home as he has not been doing this- does not have recent readings. He will start this. Patient confused if he should continue Metoprolol and Amlodipine- advised per discharge instructions he was to start Metoprolol.  Patient has appointment with PCP tomorrow and will keep this appointment, keep appointment with [place], monitor BP/HR at home- call us with any issues or concerns.   Patient is on Brilinta- advised patient to try to take medication with caffeine this may help- he will try this and let us know if it does not help.

## 2022-12-13 NOTE — Telephone Encounter (Signed)
Pt c/o Shortness Of Breath: STAT if SOB developed within the last 24 hours or pt is noticeably SOB on the phone  1. Are you currently SOB (can you hear that pt is SOB on the phone)? No  2. How long have you been experiencing SOB? Not sure how long it has been going on, states that it has been a whirlwind lately.  3. Are you SOB when sitting or when up moving around? States that he hasn't kept up with when he is getting SOB. He says that he will make note of that today. He said that yesterday when he got SOB he was just walking slowly and noticed it and had to go lay down.   4. Are you currently experiencing any other symptoms? Some chest pain and headaches. Has not taken nitroglycerin, states that he is not sure if he is just sore from his cath. He has a lot of question's and just needs clarification on how and when to take nitro and what is normal/not normal for after his surgery.

## 2022-12-13 NOTE — Telephone Encounter (Signed)
Awaiting scheduling to make appointment.

## 2022-12-13 NOTE — Telephone Encounter (Signed)
Noted. Thanks!  Patient does have Nitroglycerin on file from hospital.

## 2022-12-14 ENCOUNTER — Encounter: Payer: Self-pay | Admitting: Family Medicine

## 2022-12-14 ENCOUNTER — Ambulatory Visit (INDEPENDENT_AMBULATORY_CARE_PROVIDER_SITE_OTHER): Payer: PPO | Admitting: Family Medicine

## 2022-12-14 VITALS — BP 132/98 | HR 74 | Wt 167.0 lb

## 2022-12-14 DIAGNOSIS — I10 Essential (primary) hypertension: Secondary | ICD-10-CM

## 2022-12-14 DIAGNOSIS — I2089 Other forms of angina pectoris: Secondary | ICD-10-CM

## 2022-12-14 DIAGNOSIS — I214 Non-ST elevation (NSTEMI) myocardial infarction: Secondary | ICD-10-CM | POA: Diagnosis not present

## 2022-12-14 NOTE — Progress Notes (Signed)
   Subjective:    Patient ID: [name], male    DOB: 01/19/1961, 62 y.o.   MRN: [id number]  HPI Here with his wife to follow up from a hospital stay from 12-10-22 to 12-11-22 for a non-STEMI. He presented with pressure like pains in the chest that radiated to the left arm. No SOB. His EKG was unremarkable, but his Tropinin was elevated. He had a left heart catheterization which showed 30-50% stenoses in the LAD and RCA. There was a 99% stenosis in the mid circumflex artery. This was stented open. An ECHO showed EF of 60-65% with no wall motion abnormalities and grade I diastolic dysfunction. His renal function was normal. He was started on Brilinta, ASA, Atorvastatin, and Metoprolol. He was given some SL NTG but he has not used any of this. Now today for the past 30 minutes he has had some intermittent pressure like pain in the chest with no SOB. He says the pain he had with the MI was rated 8 out of 10 in intensity, while his current pain is a 4 out of 10. We immediately had him take one SL NTG, and within a minutes the pain was down to a 2. We then gave him a second NTG, and the pain completely resolved. His BP has been stable at home.    Review of Systems  Constitutional: Negative.   Respiratory: Negative.    Cardiovascular:  Positive for chest pain. Negative for palpitations and leg swelling.  Gastrointestinal: Negative.   Genitourinary: Negative.   Neurological: Negative.        Objective:   Physical Exam Constitutional:      Comments: In a wheelchair   Cardiovascular:     Rate and Rhythm: Normal rate and regular rhythm.     Pulses: Normal pulses.     Heart sounds: Normal heart sounds.  Pulmonary:     Effort: Pulmonary effort is normal.     Breath sounds: Normal breath sounds.  Musculoskeletal:     Right lower leg: No edema.     Left lower leg: No edema.  Neurological:     General: No focal deficit present.     Mental Status: He is alert and oriented to person, place,  and time.           Assessment & Plan:  He is recovering from a non-STEMI, and he was having some angina pain here in our office. This resolved after taking 2 SL NG. I encouraged him to use these whenever he feels the chest pain. His HTN is stable. He is scheduled to follow up with Cardiology on 12-24-22. At that visit they will discuss cardiac rehab, etc. We spent a total of ( 35  ) minutes reviewing records and discussing these issues.  Alysia Penna, MD

## 2022-12-15 ENCOUNTER — Telehealth: Payer: Self-pay | Admitting: Cardiology

## 2022-12-15 NOTE — Telephone Encounter (Signed)
Pt c/o of Chest Pain: STAT if CP now or developed within 24 hours  1. Are you having CP right now? No  2. Are you experiencing any other symptoms (ex. SOB, nausea, vomiting, sweating)? SOB a little, top teeth were hurting.   3. How long have you been experiencing CP? Yesterday  4. Is your CP continuous or coming and going? Yesterday it went away after the nitroglycerin, but last night it took longer to go away  5. Have you taken Nitroglycerin? Yes ?  Pt stated they went to their PCP yesterday morning and explained the chest pain. The PCP explained that he has Angina and showed him how to take his Nitroglycerin. The pt took 2 pills at the office and the chest pain had stated again, so he took 2 more nitroglycerin. Taking the nitroglycerin helped with the chest pain, but last night it took longer for the chest pain to go away and the pt stayed up till about 2AM till they felt safe to go to bed. Pt did also state when they had their heart attack prior, the pt's top teeth were hurting and he did experience that same pain with this chest pain yesterday. Pt wants to know what signs he needs to watch out for and if he should be concerned.

## 2022-12-15 NOTE — Telephone Encounter (Signed)
Patient did not answer.  Left message with basic information of chest pain continues and nitro does not work to please seek medical attention at ED since we are unable to discuss in detail.  Message states he saw his PCP but with little information I suggest he seek medical attention if continued chest pain.

## 2022-12-17 ENCOUNTER — Telehealth: Payer: Self-pay | Admitting: Cardiology

## 2022-12-17 NOTE — Telephone Encounter (Signed)
[name] wife called stating patient is having neck pain that is going down to his shoulder blade on his left side.  She states he does have back problems, she states he was hurting him when he was in the hospital, but it's has gotten worse since he has gotten home.  She states he has no other symptoms.

## 2022-12-17 NOTE — Telephone Encounter (Signed)
Not sure what to think about Headache -- usually NTG makes HA worse.    Also pain going to L neck & shoulder blade is not likely cardiac, but reasonable to be evaluated if it is concerning enough.   Cannot diagnose via phone note.  Needs to be seen.  [place]

## 2022-12-17 NOTE — Telephone Encounter (Signed)
Patient has called two days later with "neck pain". Discusses this same information in other encounter.  This encounter no longer needed

## 2022-12-17 NOTE — Telephone Encounter (Signed)
Spoke with wife with patient in background.  He complains with Pain to Left shoulder blade, neck and axillary area. Has a "horrible headache". No chest pain, no jaw pain, non radiating. No dizziness or other symptoms. Verified he is taking metoprolol and amlodipine with BP 122/81 HR 72.  States he had this on Tuesday (this is when he did not answer return calls and LVM to seek medical attention if continued).  Patient states Tuesday he had an episode at family doctor and took 2 nitro which resolved the pain,.  Then Tuesday 9pm another episode, he took 2 nitro but pain did not resolve until 2am.  States he was going to go to the ER but then got better so he didn't go.  He states the pain back today and is ed it is his heart and what he should do.

## 2022-12-20 ENCOUNTER — Encounter: Payer: Self-pay | Admitting: Cardiology

## 2022-12-20 NOTE — Telephone Encounter (Signed)
Patient contacted regarding discharge from [facility] on 03/22.  Patient understands to follow up with provider Jory Sims, NP on 03/25 at 2:20 PM at [facility]. Patient understands discharge instructions? Yes Patient understands medications and regiment? Yes Patient understands to bring all medications to this visit? Yes   See other phone notes of discussion about neck pain.

## 2022-12-20 NOTE — Telephone Encounter (Signed)
Patient is returning call. Patient did state that they are only taking a muscle relaxer for the neck spasms. The patient had spoken with the pharmacy and was told they should not the Prednisone and patient stated they are unable to take the pain medication that he was prescribed. The patient is unsure what they should do. Please advise

## 2022-12-20 NOTE — Telephone Encounter (Signed)
Error

## 2022-12-20 NOTE — Telephone Encounter (Signed)
Called patient back, advised of message below from MD- advised he should see PCP. And he says that they keep telling him to see Cardiology- I advised he has appointment with PCP tomorrow, he should keep that appointment, and also come see [place] Friday to discuss further.  Patient verbalized understanding

## 2022-12-20 NOTE — Telephone Encounter (Signed)
Med concern if can take prednisone with methocarbamol  while on his BP medications.  Spoke with patient wife and him.  They are now stating he "has this issue once a year" and it is related to his neck.  (Two messages regarding"chest/shoulder pain") He is currently wearing a neck brace and taking methocarbamol for pain.  He states he was told not to take prednisone at the same time due to the BP medications he is on. He states "I can't take my oxycodone for break through pain because the meds your office gives me makes me constipated, even with the laxative you gave me and all the natural stuff I do".  He states he was told not to take his prednisone with the methocarbamol so he ask if he cannot take this then what can he take.  I advised I would get information regarding meds but also advised, since this is not cardiac related, to reach out to PCP.  They in turn can send him to orthopedic for help and see if another alternative such as injection, for pain management.  He states he will do this as well

## 2022-12-20 NOTE — Telephone Encounter (Signed)
Call goes straight to VM.  (Home phone no longer in service). LVM to please call the office for recommendations from provider

## 2022-12-21 ENCOUNTER — Ambulatory Visit: Payer: PPO | Admitting: Family Medicine

## 2022-12-22 ENCOUNTER — Ambulatory Visit (INDEPENDENT_AMBULATORY_CARE_PROVIDER_SITE_OTHER): Payer: PPO | Admitting: Family Medicine

## 2022-12-22 VITALS — BP 130/92 | HR 80 | Temp 98.1°F | Wt 165.4 lb

## 2022-12-22 DIAGNOSIS — G894 Chronic pain syndrome: Secondary | ICD-10-CM | POA: Diagnosis not present

## 2022-12-22 DIAGNOSIS — M62838 Other muscle spasm: Secondary | ICD-10-CM | POA: Diagnosis not present

## 2022-12-22 MED ORDER — BACLOFEN 20 MG PO TABS: 20.0000 mg | ORAL_TABLET | Freq: Three times a day (TID) | ORAL | 5 refills | Status: DC | PRN

## 2022-12-22 NOTE — Progress Notes (Signed)
Cardiology Clinic Note   Patient Name: [name] Date of Encounter: 12/24/2022  Primary Care Provider:  [name], MD Primary Cardiologist:  Glenetta Hew, MD  Patient Profile    62 y.o. male with a hx of chronic pain syndrome, HTN, kidney stones, HLD, anxiety, tobacco abuse, and ADHD. Seen on consultation for chest pain during recent admission by Dr.Harding on 12/10/2022. Multiple allergies. Diagnosed with NSTEMI, with follow up cardiac cath revealing patient LM, and LAD with mild non-obstructive plaquing but no stenosis. Eccentric non-obstructive mid RCA stenosis with diffuse plaquing, severe subtotal 99% stenosis of the mid Cx with sequential lesion into the branching OM, which was treated with PCI using 2.25 X 24 mm Synergy DES. EF of 55%. Placed on DAPT with ASA and Brilinta. He is also being treated for muscle spasms of the neck.   Past Medical History    Past Medical History:  Diagnosis Date   ADHD (attention deficit hyperactivity disorder), inattentive type    sees Dr. Celesta Aver in [place], [place]    sees Dr. Celesta Aver in [place], [place]    Arthritis    Benign enlargement of prostate    sees [facility]    Carpal tunnel syndrome on left    sees Dr. Eugene Garnet at [place] pain    sees Dr. Nicholaus Bloom (meds) and Dorathy Kinsman (accupuncture)    Depression    sees Dr. Celesta Aver   Functional impotence    Hypogonadism in male    sees Larene Beach (a [place]) at [facility]    Insomnia    Kidney stones    Sinusitis, acute 07/17/2016   Urticaria due to cold    Past Surgical History:  Procedure Laterality Date   APPENDECTOMY     CERVICAL FUSION  2008   C6-7 per Dr. Rennis Harding   COLONOSCOPY  11/06/2009   per Dr. Teena Irani, no polyps, repeat in 10 yrs    CORONARY STENT INTERVENTION N/A 12/10/2022   Procedure: CORONARY STENT INTERVENTION;  Surgeon: Sherren Mocha, MD;  Location: [facility];  Service: Cardiovascular;   Laterality: N/A;   FOOT SURGERY Right    x 2   LEFT HEART CATH AND CORONARY ANGIOGRAPHY N/A 12/10/2022   Procedure: LEFT HEART CATH AND CORONARY ANGIOGRAPHY;  Surgeon: Sherren Mocha, MD;  Location: [facility];  Service: Cardiovascular;  Laterality: N/A;   LUMBAR DISC SURGERY     L5-S1, C 6-6    Allergies  Allergies  Allergen Reactions   Fire Ant Anaphylaxis    Has Epi pen Has Epi pen    Codeine     REACTION: ITCHING   Cyclobenzaprine Other (See Comments)    Nightmares   Docusate Sodium     swelling   Dulcolax Stool Softener [Dss]     swelling   Gabapentin     migraine   Ketorolac     Other reaction(s): Vomiting (intolerance)   Ketorolac Tromethamine Other (See Comments)    REACTION: SEVERE VOMMITING   Lisinopril     Cough & chest tightness   Methadone Other (See Comments)    Loss of appetite   Pregabalin     headaches Other reaction(s): Confusion (intolerance)    History of Present Illness    [name] comes today for ongoing assessment and management of CAD, s/p NSTEMI with [facility] on 12/10/2022 revealing 99% stenosis of the mid circumflex, with PCI using DES as described above.  He has called our office for advisement concerning taking methocarbamol for chronic neck pain with his antihypertensive medications. He has been having recurrent discomfort requiring NTG.  Saw PCP, Dr.Fry on 12/14/2022 where he was given NTG due to chest pain. He was referred back to cardiology for follow up.   He comes today with his wife and has multiple questions concerning his procedure, medications, symptoms of ongoing neck pain, neurologic pain, follow-up with specialists, complaints of headaches since starting new medications.    Home Medications    Current Outpatient Medications  Medication Sig Dispense Refill   acyclovir (ZOVIRAX) 800 MG tablet Take 1 tablet (800 mg total) by mouth 2 (two) times daily. (Patient taking differently: Take 800 mg by mouth 2 (two) times daily as  needed (For outbreaks).) 180 tablet 3   amLODipine (NORVASC) 10 MG tablet TAKE 1 TABLET BY MOUTH DAILY (Patient taking differently: Take 10 mg by mouth every evening.) 90 tablet 3   aspirin EC 81 MG tablet Take 1 tablet (81 mg total) by mouth daily. Swallow whole. 30 tablet 12   atorvastatin (LIPITOR) 80 MG tablet Take 1 tablet (80 mg total) by mouth daily. 90 tablet 2   baclofen (LIORESAL) 20 MG tablet Take 1 tablet (20 mg total) by mouth every 8 (eight) hours as needed for muscle spasms. 60 each 5   cetirizine (ZYRTEC) 10 MG tablet Take 10 mg by mouth daily.     diazepam (VALIUM) 10 MG tablet Take 10 mg by mouth 2 (two) times daily.     diclofenac sodium (VOLTAREN) 1 % GEL Apply 1 application  topically daily as needed (For pain).     EPINEPHrine 0.3 mg/0.3 mL IJ SOAJ injection USE AS DIRECTED (Patient taking differently: Inject 0.3 mg into the muscle daily as needed for anaphylaxis.) 2 each 1   fentaNYL (DURAGESIC - DOSED MCG/HR) 50 MCG/HR Place 50 mcg onto the skin every other day.     fluticasone (FLONASE) 50 MCG/ACT nasal spray USE TWO SPRAYS IN EACH NOSTRIL DAILY (Patient taking differently: Place 1 spray into both nostrils daily as needed for allergies.) 16 g 11   lactulose, encephalopathy, (CHRONULAC) 10 GM/15ML SOLN Take 20 g by mouth at bedtime.     metoprolol succinate (TOPROL-XL) 25 MG 24 hr tablet Take 1 tablet (25 mg total) by mouth daily. 90 tablet 2   nitroGLYCERIN (NITROSTAT) 0.4 MG SL tablet Place 1 tablet (0.4 mg total) under the tongue every 5 (five) minutes as needed for chest pain. 100 tablet 3   oxyCODONE (OXY IR/ROXICODONE) 5 MG immediate release tablet Take 5 mg by mouth every 6 (six) hours as needed for moderate pain.  0   promethazine (PHENERGAN) 25 MG tablet Take 12.5 mg by mouth every 6 (six) hours as needed for nausea or vomiting.     ticagrelor (BRILINTA) 90 MG TABS tablet Take 1 tablet (90 mg total) by mouth 2 (two) times daily. 60 tablet 11   traZODone (DESYREL) 100  MG tablet Take 50 mg by mouth at bedtime.     triamcinolone cream (KENALOG) 0.1 % Apply 1 application topically 2 (two) times daily. (Patient taking differently: Apply 1 application  topically daily as needed (For rash).) 30 g 0   No current facility-administered medications for this visit.     Family History    Family History  Problem Relation Age of Onset   Heart disease Other    Diabetes Other    Hyperlipidemia Other    Hypertension Sister  Breast cancer Sister    Heart disease Father    Diabetes Father    Heart attack Father        x 2   Prostate cancer Neg Hx    Kidney disease Neg Hx    He indicated that his mother is alive. He indicated that his father is alive. He indicated that his sister is alive. He indicated that his maternal grandmother is deceased. He indicated that his maternal grandfather is deceased. He indicated that his paternal grandmother is deceased. He indicated that his paternal grandfather is deceased. He indicated that the status of his neg hx is unknown. He indicated that the status of his other is unknown.  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  Tobacco Use   Smoking status: Former   Smokeless tobacco: Never   Tobacco comments:    quit 35 years  Substance and Sexual Activity   Alcohol use: Yes    Alcohol/week: 0.0 standard drinks of alcohol    Comment: occasional   Drug use: No   Sexual activity: Yes    Birth control/protection: Condom  Other Topics Concern   Not on file  Social History Narrative   Lives with wife   Caffeine- coffee 1-2 cups, rarely a soda   Social Determinants of Health   Financial Resource Strain: Low Risk  (12/22/2022)    Overall Financial Resource Strain (CARDIA)    Difficulty of Paying Living Expenses: Not very hard  Food Insecurity: No Food Insecurity (12/22/2022)   Hunger Vital Sign    Worried About Running Out of Food in the Last Year: Never true    Ran Out of Food in the Last Year: Never true  Transportation Needs: No Transportation Needs (12/22/2022)   PRAPARE - Hydrologist (Medical): No    Lack of Transportation (Non-Medical): No  Physical Activity: Insufficiently Active (12/22/2022)   Exercise Vital Sign    Days of Exercise per Week: 3 days    Minutes of Exercise per Session: 20 min  Stress: Stress Concern Present (12/22/2022)   [place]    Feeling of Stress : Rather much  Social Connections: Socially Integrated (12/22/2022)   Social Connection and Isolation Panel [NHANES]    Frequency of Communication with Friends and Family: More than three times a week    Frequency of Social Gatherings with Friends and Family: Twice a week    Attends Religious Services: 1 to 4 times per year    Active Member of [employer] or Organizations: Patient declined    Attends Music therapist: More than 4 times per year    Marital Status: Married  Human resources officer Violence: Not At Risk (12/31/2021)   Humiliation, Afraid, Rape, and Kick questionnaire    Fear of Current or Ex-Partner: No    Emotionally Abused: No    Physically Abused: No    Sexually Abused: No     Review of Systems    General:  No chills, fever, night sweats or weight changes.  Cardiovascular:  No chest pain, dyspnea on exertion, edema, orthopnea, palpitations, paroxysmal nocturnal dyspnea. Dermatological: No rash, lesions/masses  Respiratory: No cough, dyspnea Urologic: No hematuria, dysuria Abdominal:   No nausea, vomiting, diarrhea, bright red blood per rectum, melena, or hematemesis Neurologic:  No visual changes, wkns, changes in mental  status.  Complains of chronic headache pain, neuralgia pain, cervical spine pain. All other systems reviewed and are otherwise negative except as noted above.    Cardiac Rehabilitation Eligibility Assessment      Physical Exam    VS:  BP 118/74 (BP Location: Left Arm, Patient Position: Sitting, Cuff Size: Normal)   Pulse 78   Ht 5\' 8"  (1.727 m)   Wt 162 lb 6.4 oz (73.7 kg)   SpO2 95%   BMI 24.69 kg/m  , BMI Body mass index is 24.69 kg/m.     GEN: Well nourished, well developed, in no acute distress.  Wearing soft neck brace HEENT: normal. Neck: Supple, no JVD, carotid bruits, or masses. Cardiac: RRR, no murmurs, rubs, or gallops. No clubbing, cyanosis, edema.  Radials/DP/PT 2+ and equal bilaterally.  Respiratory:  Respirations regular and unlabored, clear to auscultation bilaterally. GI: Soft, nontender, nondistended, BS + x 4. MS: no deformity or atrophy.  Cardiac catheterization insertion site right wrist well-healed without evidence of hematoma or bleeding. Skin: warm and dry, no rash. Neuro:  Strength and sensation are intact.  Extremely sensitive to palpation of the left foot and ankle. Psych: Normal affect.  Accessory Clinical Findings    ECG personally reviewed by me today-normal sinus rhythm, heart rate of 78 bpm- No acute changes  Lab Results  Component Value Date   WBC 9.4 12/11/2022   HGB 15.9 12/11/2022   HCT 47.5 12/11/2022   MCV 90.5 12/11/2022   PLT 195 12/11/2022   Lab Results  Component Value Date   CREATININE 0.97 12/11/2022   BUN 10 12/11/2022   NA 133 (L) 12/11/2022   K 3.5 12/11/2022   CL 100 12/11/2022   CO2 23 12/11/2022   Lab Results  Component Value Date   ALT 65 (H) 11/09/2021   AST 44 (H) 11/09/2021   ALKPHOS 68 11/09/2021   BILITOT 0.6 11/09/2021   Lab Results  Component Value Date   CHOL 238 (H) 12/10/2022   HDL 61 12/10/2022   LDLCALC 163 (H) 12/10/2022   LDLDIRECT 179.9 12/18/2012   TRIG 71 12/10/2022   CHOLHDL 3.9  12/10/2022    Lab Results  Component Value Date   HGBA1C 5.9 (H) 12/11/2022    Review of Prior Studies: [facility] 12/10/2022 Left Main The vessel exhibits minimal luminal irregularities.  Left Anterior Descending There is mild diffuse disease throughout the vessel.  Left Circumflex Mid Cx lesion is 99% stenosed.  Right Coronary Artery There is mild diffuse disease throughout the vessel. Prox RCA lesion is 30% stenosed. Mid RCA lesion is 50% stenosed. The lesion is eccentric.    Diagnostic Dominance: Right  Intervention    Echocardiogram 12/11/2022 1. Left ventricular ejection fraction, by estimation, is 60 to 65%. The  left ventricle has normal function. The left ventricle has no regional  wall motion abnormalities. Left ventricular diastolic parameters are  consistent with Grade I diastolic  dysfunction (impaired relaxation).   2. Right ventricular systolic function is normal. The right ventricular  size is normal. There is normal pulmonary artery systolic pressure.   3. No evidence of mitral valve regurgitation.   4. Aortic valve regurgitation is not visualized.   5. The inferior vena cava is normal in size with greater than 50%  respiratory variability, suggesting right atrial  pressure of 3 mmHg.   Assessment & Plan   Coronary artery disease: Status post left heart cath on 12/10/2022 in the setting of chest pain on arrival to ED.  Found to have 99% stenosis of the mid circumflex with sequential lesion into the branching OM.  Status post PCI.  On dual antiplatelet therapy with Brilinta, and aspirin.  Has been having headaches since starting this medicine during hospitalization.  I did discuss changing to clopidogrel but would like to wait another 2 weeks to see if this subsides on its own.  I have suggested that he made have a small amount of caffeine after taking the Brilinta which may be helpful.  He denies any shortness of breath but his wife has noticed that he does take  some deep breaths shortly after taking the Brilinta.  He will be seen again in 2 weeks at which time I will ascertain whether or not he will be time to switch over to clopidogrel which will require reloading. Multiple questions have been asked by the patient and answered by myself concerning his cardiac catheterization, medication regimen, and activity.  He is cleared to move forward with cardiac rehab.  2.  Hypertension: Today blood pressure is very well-controlled on regimen.  Despite the fact that he is in constant pain with his neck his pressure remained stable.  He will continue on amlodipine 10 mg daily, metoprolol succinate 25 mg daily.   3.  Hypercholesterolemia: He is on high-dose atorvastatin 80 mg daily.  Baseline total cholesterol 238, LDL 163, HDL 61.  He will have follow-up lipids and LFTs in 6 weeks.  4.  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  In the past he had been placed on prednisone.  This is not recommended currently as it will increase heart rate and blood pressure.  Would like to keep him stable from a cardiovascular standpoint post PCI.  He is being [facility] for recommendations on treatment regimen.  5.  Chronic pain syndrome: Is being followed by pain management.     Cardiac Rehabilitation Eligibility Assessment         Signed, Phill Myron. West Pugh, ANP, [place]   12/24/2022 5:09 PM      Office [phone] Fax [phone]  Notice: This dictation was prepared with Dragon dictation along with smaller phrase technology. Any transcriptional errors that result from this process are unintentional and may not be corrected upon review.

## 2022-12-22 NOTE — Progress Notes (Signed)
   Subjective:    Patient ID: [name], male    DOB: 09/28/1960, 62 y.o.   MRN: [id number]  HPI Here for one week of tightness and increased pain in the neck. He has tried heat and his usual medications, including Methocarbamol, with no relief.    Review of Systems  Constitutional: Negative.   Respiratory: Negative.    Cardiovascular: Negative.   Musculoskeletal:  Positive for neck pain and neck stiffness.       Objective:   Physical Exam Constitutional:      Comments: In pain, wearing a soft cervical collar   Cardiovascular:     Rate and Rhythm: Normal rate and regular rhythm.     Pulses: Normal pulses.     Heart sounds: Normal heart sounds.  Pulmonary:     Effort: Pulmonary effort is normal.     Breath sounds: Normal breath sounds.  Musculoskeletal:     Comments: He is tender over the lower posterior neck and the paraspinal muscles are very tight   Neurological:     Mental Status: He is alert.           Assessment & Plan:  He is having neck spasms. He will take his usual pain medications, but we will try a new type of muscle relaxer. We will stop Methocarbamol and try Baclofen 20 mg TID as needed. I also suggested that he get a massage and that he try his TENS unit for this.  Alysia Penna, MD

## 2022-12-24 ENCOUNTER — Encounter: Payer: Self-pay | Admitting: Adult Health

## 2022-12-24 ENCOUNTER — Ambulatory Visit: Payer: PPO | Attending: Adult Health | Admitting: Adult Health

## 2022-12-24 VITALS — BP 118/74 | HR 78 | Ht 68.0 in | Wt 162.4 lb

## 2022-12-24 DIAGNOSIS — G894 Chronic pain syndrome: Secondary | ICD-10-CM

## 2022-12-24 DIAGNOSIS — I10 Essential (primary) hypertension: Secondary | ICD-10-CM | POA: Diagnosis not present

## 2022-12-24 NOTE — Patient Instructions (Addendum)
Medication Instructions:  Your physician recommends that you continue on your current medications as directed. Please refer to the Current Medication list given to you today.   *If you need a refill on your cardiac medications before your next appointment, please call your pharmacy*   Lab Work: None ordered    Testing/Procedures: None ordered   Follow-Up: At [facility], you and your health needs are our priority.  As part of our continuing mission to provide you with exceptional heart care, we have created designated Provider Care Teams.  These Care Teams include your primary Cardiologist (physician) and Advanced Practice Providers (APPs -  Physician Assistants and Nurse Practitioners) who all work together to provide you with the care you need, when you need it.  We recommend signing up for the patient portal called "MyChart".  Sign up information is provided on this After Visit Summary.  MyChart is used to connect with patients for Virtual Visits (Telemedicine).  Patients are able to view lab/test results, encounter notes, upcoming appointments, etc.  Non-urgent messages can be sent to your provider as well.   To learn more about what you can do with MyChart, go to [email or web address].    Your next appointment:   2 week(s)  Provider:   Glenetta Hew, MD  or Jory Sims, DNP, ANP       Other Instructions:  Limit caffeine intake. [place] information provided.

## 2023-01-03 ENCOUNTER — Telehealth: Payer: Self-pay | Admitting: Cardiology

## 2023-01-03 NOTE — Telephone Encounter (Signed)
Returned call to pt, informed to eat soft foods until healed. Pt states that he did call the dentist and was told the same thing. He will call back if anything else is needed

## 2023-01-03 NOTE — Telephone Encounter (Signed)
Patient called stating he bit down on a pita chip last night a cut the inside of his mouth, he states it took awhile to get it to stop bleeding.  He now has a scab on the inside of his mouth, but he is afraid when he eats the scab is going to come off and it will start bleeding again.  He is not sure what to do now.

## 2023-01-04 ENCOUNTER — Encounter: Payer: PPO | Attending: Cardiology | Admitting: *Deleted

## 2023-01-04 DIAGNOSIS — Z955 Presence of coronary angioplasty implant and graft: Secondary | ICD-10-CM

## 2023-01-04 DIAGNOSIS — I214 Non-ST elevation (NSTEMI) myocardial infarction: Secondary | ICD-10-CM

## 2023-01-04 DIAGNOSIS — Z5189 Encounter for other specified aftercare: Secondary | ICD-10-CM | POA: Insufficient documentation

## 2023-01-04 DIAGNOSIS — I252 Old myocardial infarction: Secondary | ICD-10-CM | POA: Insufficient documentation

## 2023-01-04 NOTE — Progress Notes (Signed)
Initial phone call completed. Diagnosis can be found in [facility] 3/15. EP Orientation scheduled for Wednesday 4/24 at 1:30pm.

## 2023-01-09 ENCOUNTER — Encounter: Payer: Self-pay | Admitting: Cardiology

## 2023-01-10 ENCOUNTER — Ambulatory Visit (INDEPENDENT_AMBULATORY_CARE_PROVIDER_SITE_OTHER): Payer: PPO

## 2023-01-10 VITALS — Ht 68.0 in | Wt 162.0 lb

## 2023-01-10 DIAGNOSIS — Z0000 Encounter for general adult medical examination without abnormal findings: Secondary | ICD-10-CM | POA: Diagnosis not present

## 2023-01-10 NOTE — Patient Instructions (Addendum)
[name] , Thank you for taking time to come for your Medicare Wellness Visit. I appreciate your ongoing commitment to your health goals. Please review the following plan we discussed and let me know if I can assist you in the future.   These are the goals we discussed:  Goals       Exercise 150 min/wk Moderate Activity      Increase physical activity (pt-stated)      I would like to exercise more.       Increase physical activity (pt-stated)        This is a list of the screening recommended for you and due dates:  Health Maintenance  Topic Date Due   COVID-19 Vaccine (5 - 2023-24 season) 01/26/2023*   Flu Shot  04/28/2023   Medicare Annual Wellness Visit  01/10/2024   Colon Cancer Screening  10/27/2030   DTaP/Tdap/Td vaccine (4 - Td or Tdap) 11/10/2031   Hepatitis C Screening: USPSTF Recommendation to screen - Ages 63-79 yo.  Completed   HIV Screening  Completed   Zoster (Shingles) Vaccine  Completed   HPV Vaccine  Aged Out  *Topic was postponed. The date shown is not the original due date.  Opioid Pain Medicine Management Opioids are powerful medicines that are used to treat moderate to severe pain. When used for short periods of time, they can help you to: Sleep better. Do better in physical or occupational therapy. Feel better in the first few days after an injury. Recover from surgery. Opioids should be taken with the supervision of a trained health care provider. They should be taken for the shortest period of time possible. This is because opioids can be addictive, and the longer you take opioids, the greater your risk of addiction. This addiction can also be called opioid use disorder. What are the risks? Using opioid pain medicines for longer than 3 days increases your risk of side effects. Side effects include: Constipation. Nausea and vomiting. Breathing difficulties (respiratory depression). Drowsiness. Confusion. Opioid use disorder. Itching. Taking opioid  pain medicine for a long period of time can affect your ability to do daily tasks. It also puts you at risk for: Motor vehicle crashes. Depression. Suicide. Heart attack. Overdose, which can be life-threatening. What is a pain treatment plan? A pain treatment plan is an agreement between you and your health care provider. Pain is unique to each person, and treatments vary depending on your condition. To manage your pain, you and your health care provider need to work together. To help you do this: Discuss the goals of your treatment, including how much pain you might expect to have and how you will manage the pain. Review the risks and benefits of taking opioid medicines. Remember that a good treatment plan uses more than one approach and minimizes the chance of side effects. Be honest about the amount of medicines you take and about any drug or alcohol use. Get pain medicine prescriptions from only one health care provider. Pain can be managed with many types of alternative treatments. Ask your health care provider to refer you to one or more specialists who can help you manage pain through: Physical or occupational therapy. Counseling (cognitive behavioral therapy). Good nutrition. Biofeedback. Massage. Meditation. Non-opioid medicine. Following a gentle exercise program. How to use opioid pain medicine Taking medicine Take your pain medicine exactly as told by your health care provider. Take it only when you need it. If your pain gets less severe, you may take  less than your prescribed dose if your health care provider approves. If you are not having pain, do nottake pain medicine unless your health care provider tells you to take it. If your pain is severe, do nottry to treat it yourself by taking more pills than instructed on your prescription. Contact your health care provider for help. Write down the times when you take your pain medicine. It is easy to become confused while on  pain medicine. Writing the time can help you avoid overdose. Take other over-the-counter or prescription medicines only as told by your health care provider. Keeping yourself and others safe  While you are taking opioid pain medicine: Do not drive, use machinery, or power tools. Do not sign legal documents. Do not drink alcohol. Do not take sleeping pills. Do not supervise children by yourself. Do not do activities that require climbing or being in high places. Do not go to a lake, river, ocean, spa, or swimming pool. Do not share your pain medicine with anyone. Keep pain medicine in a locked cabinet or in a secure area where pets and children cannot reach it. Stopping your use of opioids If you have been taking opioid medicine for more than a few weeks, you may need to slowly decrease (taper) how much you take until you stop completely. Tapering your use of opioids can decrease your risk of symptoms of withdrawal, such as: Pain and cramping in the abdomen. Nausea. Sweating. Sleepiness. Restlessness. Uncontrollable shaking (tremors). Cravings for the medicine. Do not attempt to taper your use of opioids on your own. Talk with your health care provider about how to do this. Your health care provider may prescribe a step-down schedule based on how much medicine you are taking and how long you have been taking it. Getting rid of leftover pills Do not save any leftover pills. Get rid of leftover pills safely by: Taking the medicine to a prescription take-back program. This is usually offered by the county or law enforcement. Bringing them to a pharmacy that has a drug disposal container. Flushing them down the toilet. Check the label or package insert of your medicine to see whether this is safe to do. Throwing them out in the trash. Check the label or package insert of your medicine to see whether this is safe to do. If it is safe to throw it out, remove the medicine from the original  container, put it into a sealable bag or container, and mix it with used coffee grounds, food scraps, dirt, or cat litter before putting it in the trash. Follow these instructions at home: Activity Do exercises as told by your health care provider. Avoid activities that make your pain worse. Return to your normal activities as told by your health care provider. Ask your health care provider what activities are safe for you. General instructions You may need to take these actions to prevent or treat constipation: Drink enough fluid to keep your urine pale yellow. Take over-the-counter or prescription medicines. Eat foods that are high in fiber, such as beans, whole grains, and fresh fruits and vegetables. Limit foods that are high in fat and processed sugars, such as fried or sweet foods. Keep all follow-up visits. This is important. Where to find support If you have been taking opioids for a long time, you may benefit from receiving support for quitting from a local support group or counselor. Ask your health care provider for a referral to these resources in your area. Where to find  more information Centers for Disease Control and Prevention (CDC): FootballExhibition.com.br [place] Food and Drug Administration (FDA): PumpkinSearch.com.ee Get help right away if: You may have taken too much of an opioid (overdosed). Common symptoms of an overdose: Your breathing is slower or more shallow than normal. You have a very slow heartbeat (pulse). You have slurred speech. You have nausea and vomiting. Your pupils become very small. You have other potential symptoms: You are very confused. You faint or feel like you will faint. You have cold, clammy skin. You have blue lips or fingernails. You have thoughts of harming yourself or harming others. These symptoms may represent a serious problem that is an emergency. Do not wait to see if the symptoms will go away. Get medical help right away. Call your local emergency  services (911 in the [place] Do not drive yourself to the hospital.  If you ever feel like you may hurt yourself or others, or have thoughts about taking your own life, get help right away. Go to your nearest emergency department or: Call your local emergency services (911 in the [place] Call the [facility] (657 458 4833 in the [place] Call a suicide crisis helpline, such as the National Suicide Prevention Lifeline at 850-442-4641 or 988 in the [place] This is open 24 hours a day in the [place] Text the Crisis Text Line at 715-193-7403 (in the [place] Summary Opioid medicines can help you manage moderate to severe pain for a short period of time. A pain treatment plan is an agreement between you and your health care provider. Discuss the goals of your treatment, including how much pain you might expect to have and how you will manage the pain. If you think that you or someone else may have taken too much of an opioid, get medical help right away. This information is not intended to replace advice given to you by your health care provider. Make sure you discuss any questions you have with your health care provider. Document Revised: 04/08/2021 Document Reviewed: 12/24/2020 Elsevier Patient Education  2023 [employer].   Advanced directives: Please bring a copy of your health care power of attorney and living will to the office to be added to your chart at your convenience.   Conditions/risks identified: None  Next appointment: Follow up in one year for your annual wellness visit   Preventive Care 40-64 Years, Male Preventive care refers to lifestyle choices and visits with your health care provider that can promote health and wellness. What does preventive care include? A yearly physical exam. This is also called an annual well check. Dental exams once or twice a year. Routine eye exams. Ask your health care provider how often you should have your eyes checked. Personal lifestyle  choices, including: Daily care of your teeth and gums. Regular physical activity. Eating a healthy diet. Avoiding tobacco and drug use. Limiting alcohol use. Practicing safe sex. Taking low-dose aspirin every day starting at age 55. What happens during an annual well check? The services and screenings done by your health care provider during your annual well check will depend on your age, overall health, lifestyle risk factors, and family history of disease. Counseling  Your health care provider may ask you questions about your: Alcohol use. Tobacco use. Drug use. Emotional well-being. Home and relationship well-being. Sexual activity. Eating habits. Work and work Astronomer. Screening  You may have the following tests or measurements: Height, weight, and BMI. Blood pressure. Lipid and cholesterol levels. These may be checked every  5 years, or more frequently if you are over 43 years old. Skin check. Lung cancer screening. You may have this screening every year starting at age 54 if you have a 30-pack-year history of smoking and currently smoke or have quit within the past 15 years. Fecal occult blood test (FOBT) of the stool. You may have this test every year starting at age 52. Flexible sigmoidoscopy or colonoscopy. You may have a sigmoidoscopy every 5 years or a colonoscopy every 10 years starting at age 39. Prostate cancer screening. Recommendations will vary depending on your family history and other risks. Hepatitis C blood test. Hepatitis B blood test. Sexually transmitted disease (STD) testing. Diabetes screening. This is done by checking your blood sugar (glucose) after you have not eaten for a while (fasting). You may have this done every 1-3 years. Discuss your test results, treatment options, and if necessary, the need for more tests with your health care provider. Vaccines  Your health care provider may recommend certain vaccines, such as: Influenza vaccine. This is  recommended every year. Tetanus, diphtheria, and acellular pertussis (Tdap, Td) vaccine. You may need a Td booster every 10 years. Zoster vaccine. You may need this after age 58. Pneumococcal 13-valent conjugate (PCV13) vaccine. You may need this if you have certain conditions and have not been vaccinated. Pneumococcal polysaccharide (PPSV23) vaccine. You may need one or two doses if you smoke cigarettes or if you have certain conditions. Talk to your health care provider about which screenings and vaccines you need and how often you need them. This information is not intended to replace advice given to you by your health care provider. Make sure you discuss any questions you have with your health care provider. Document Released: 10/10/2015 Document Revised: 06/02/2016 Document Reviewed: 07/15/2015 Elsevier Interactive Patient Education  2017 [employer].  Fall Prevention in the Home Falls can cause injuries. They can happen to people of all ages. There are many things you can do to make your home safe and to help prevent falls. What can I do on the outside of my home? Regularly fix the edges of walkways and driveways and fix any cracks. Remove anything that might make you trip as you walk through a door, such as a raised step or threshold. Trim any bushes or trees on the path to your home. Use bright outdoor lighting. Clear any walking paths of anything that might make someone trip, such as rocks or tools. Regularly check to see if handrails are loose or broken. Make sure that both sides of any steps have handrails. Any raised decks and porches should have guardrails on the edges. Have any leaves, snow, or ice cleared regularly. Use sand or salt on walking paths during winter. Clean up any spills in your garage right away. This includes oil or grease spills. What can I do in the bathroom? Use night lights. Install grab bars by the toilet and in the tub and shower. Do not use towel bars  as grab bars. Use non-skid mats or decals in the tub or shower. If you need to sit down in the shower, use a plastic, non-slip stool. Keep the floor dry. Clean up any water that spills on the floor as soon as it happens. Remove soap buildup in the tub or shower regularly. Attach bath mats securely with double-sided non-slip rug tape. Do not have throw rugs and other things on the floor that can make you trip. What can I do in the bedroom? Use night  lights. Make sure that you have a light by your bed that is easy to reach. Do not use any sheets or blankets that are too big for your bed. They should not hang down onto the floor. Have a firm chair that has side arms. You can use this for support while you get dressed. Do not have throw rugs and other things on the floor that can make you trip. What can I do in the kitchen? Clean up any spills right away. Avoid walking on wet floors. Keep items that you use a lot in easy-to-reach places. If you need to reach something above you, use a strong step stool that has a grab bar. Keep electrical cords out of the way. Do not use floor polish or wax that makes floors slippery. If you must use wax, use non-skid floor wax. Do not have throw rugs and other things on the floor that can make you trip. What can I do with my stairs? Do not leave any items on the stairs. Make sure that there are handrails on both sides of the stairs and use them. Fix handrails that are broken or loose. Make sure that handrails are as long as the stairways. Check any carpeting to make sure that it is firmly attached to the stairs. Fix any carpet that is loose or worn. Avoid having throw rugs at the top or bottom of the stairs. If you do have throw rugs, attach them to the floor with carpet tape. Make sure that you have a light switch at the top of the stairs and the bottom of the stairs. If you do not have them, ask someone to add them for you. What else can I do to help  prevent falls? Wear shoes that: Do not have high heels. Have rubber bottoms. Are comfortable and fit you well. Are closed at the toe. Do not wear sandals. If you use a stepladder: Make sure that it is fully opened. Do not climb a closed stepladder. Make sure that both sides of the stepladder are locked into place. Ask someone to hold it for you, if possible. Clearly mark and make sure that you can see: Any grab bars or handrails. First and last steps. Where the edge of each step is. Use tools that help you move around (mobility aids) if they are needed. These include: Canes. Walkers. Scooters. Crutches. Turn on the lights when you go into a dark area. Replace any light bulbs as soon as they burn out. Set up your furniture so you have a clear path. Avoid moving your furniture around. If any of your floors are uneven, fix them. If there are any pets around you, be aware of where they are. Review your medicines with your doctor. Some medicines can make you feel dizzy. This can increase your chance of falling. Ask your doctor what other things that you can do to help prevent falls. This information is not intended to replace advice given to you by your health care provider. Make sure you discuss any questions you have with your health care provider. Document Released: 07/10/2009 Document Revised: 02/19/2016 Document Reviewed: 10/18/2014 Elsevier Interactive Patient Education  2017 [employer].

## 2023-01-10 NOTE — Progress Notes (Signed)
Subjective:   [name] is a 62 y.o. male who presents for Medicare Annual/Subsequent preventive examination.  Review of Systems    Virtual Visit via Telephone Note  I connected with  [name] on 01/10/23 at  1:30 PM EDT by telephone and verified that I am speaking with the correct person using two identifiers.  Location: Patient: Office Provider: Office Persons participating in the virtual visit: patient/Nurse Health Advisor   I discussed the limitations, risks, security and privacy concerns of performing an evaluation and management service by telephone and the availability of in person appointments. The patient expressed understanding and agreed to proceed.  Interactive audio and video telecommunications were attempted between this nurse and patient, however failed, due to patient having technical difficulties OR patient did not have access to video capability.  We continued and completed visit with audio only.  Some vital signs may be absent or patient reported.   Tillie Rung, LPN  Cardiac Risk Factors include: advanced age (>55men, >12 women);male gender;hypertension     Objective:    Today's Vitals   01/10/23 1329  Weight: 162 lb (73.5 kg)  Height: 5\' 8"  (1.727 m)   Body mass index is 24.63 kg/m.     01/10/2023    1:48 PM 12/10/2022   10:55 AM 08/27/2022   12:11 PM 12/31/2021    2:08 PM 09/16/2020    3:58 PM  Advanced Directives  Does Patient Have a Medical Advance Directive? Yes No No Yes Yes  Type of Estate agent of [place];Living will   Healthcare Power of [place];Living will Healthcare Power of [place];Living will  Does patient want to make changes to medical advance directive?    No - Patient declined No - Patient declined  Copy of Healthcare Power of Attorney in Chart?    No - copy requested No - copy requested  Would patient like information on creating a medical advance directive? No - Patient declined         Current Medications (verified) Outpatient Encounter Medications as of 01/10/2023  Medication Sig   acyclovir (ZOVIRAX) 800 MG tablet Take 1 tablet (800 mg total) by mouth 2 (two) times daily. (Patient taking differently: Take 800 mg by mouth 2 (two) times daily as needed (For outbreaks).)   amLODipine (NORVASC) 10 MG tablet TAKE 1 TABLET BY MOUTH DAILY (Patient taking differently: Take 10 mg by mouth every evening.)   aspirin EC 81 MG tablet Take 1 tablet (81 mg total) by mouth daily. Swallow whole.   atorvastatin (LIPITOR) 80 MG tablet Take 1 tablet (80 mg total) by mouth daily.   baclofen (LIORESAL) 20 MG tablet Take 1 tablet (20 mg total) by mouth every 8 (eight) hours as needed for muscle spasms.   cetirizine (ZYRTEC) 10 MG tablet Take 10 mg by mouth daily.   diazepam (VALIUM) 10 MG tablet Take 10 mg by mouth 2 (two) times daily.   diclofenac sodium (VOLTAREN) 1 % GEL Apply 1 application  topically daily as needed (For pain).   EPINEPHrine 0.3 mg/0.3 mL IJ SOAJ injection USE AS DIRECTED (Patient taking differently: Inject 0.3 mg into the muscle daily as needed for anaphylaxis.)   fentaNYL (DURAGESIC - DOSED MCG/HR) 50 MCG/HR Place 50 mcg onto the skin every other day.   fluticasone (FLONASE) 50 MCG/ACT nasal spray USE TWO SPRAYS IN EACH NOSTRIL DAILY (Patient taking differently: Place 1 spray into both nostrils daily as needed for allergies.)   lactulose, encephalopathy, (CHRONULAC) 10  GM/15ML SOLN Take 20 g by mouth at bedtime.   metoprolol succinate (TOPROL-XL) 25 MG 24 hr tablet Take 1 tablet (25 mg total) by mouth daily.   nitroGLYCERIN (NITROSTAT) 0.4 MG SL tablet Place 1 tablet (0.4 mg total) under the tongue every 5 (five) minutes as needed for chest pain.   oxyCODONE (OXY IR/ROXICODONE) 5 MG immediate release tablet Take 5 mg by mouth every 6 (six) hours as needed for moderate pain. (Patient not taking: Reported on 01/04/2023)   promethazine (PHENERGAN) 25 MG tablet Take 12.5 mg by  mouth every 6 (six) hours as needed for nausea or vomiting.   ticagrelor (BRILINTA) 90 MG TABS tablet Take 1 tablet (90 mg total) by mouth 2 (two) times daily.   traZODone (DESYREL) 100 MG tablet Take 50 mg by mouth at bedtime.   triamcinolone cream (KENALOG) 0.1 % Apply 1 application topically 2 (two) times daily. (Patient taking differently: Apply 1 application  topically daily as needed (For rash).)   No facility-administered encounter medications on file as of 01/10/2023.    Allergies (verified) Fire ant, Codeine, Cyclobenzaprine, Docusate sodium, Dulcolax stool softener [dss], Gabapentin, Ketorolac, Ketorolac tromethamine, Lisinopril, Methadone, and Pregabalin   History: Past Medical History:  Diagnosis Date   ADHD (attention deficit hyperactivity disorder), inattentive type    sees Dr. Deboraha Sprang in [place], [place]    Anxiety    sees Dr. Deboraha Sprang in [place], [place]    Arthritis    Benign enlargement of prostate    sees [facility]    Carpal tunnel syndrome on left    sees Dr. Kathie Dike at [facility]    Chronic pain    sees Dr. Thyra Breed (meds) and Altamease Oiler (accupuncture)    Depression    sees Dr. Deboraha Sprang   Functional impotence    Hypogonadism in male    sees Carollee Herter (a [place]) at [facility]    Insomnia    Kidney stones    Sinusitis, acute 07/17/2016   Urticaria due to cold    Past Surgical History:  Procedure Laterality Date   APPENDECTOMY     CERVICAL FUSION  2008   C6-7 per Dr. Sharolyn Douglas   COLONOSCOPY  11/06/2009   per Dr. Dorena Cookey, no polyps, repeat in 10 yrs    CORONARY STENT INTERVENTION N/A 12/10/2022   Procedure: CORONARY STENT INTERVENTION;  Surgeon: Tonny Bollman, MD;  Location: [facility] INVASIVE CV LAB;  Service: Cardiovascular;  Laterality: N/A;   FOOT SURGERY Right    x 2   LEFT HEART CATH AND CORONARY ANGIOGRAPHY N/A 12/10/2022   Procedure: LEFT HEART CATH AND CORONARY ANGIOGRAPHY;  Surgeon: Tonny Bollman, MD;  Location: [facility]  INVASIVE CV LAB;  Service: Cardiovascular;  Laterality: N/A;   LUMBAR DISC SURGERY     L5-S1, C 6-6   Family History  Problem Relation Age of Onset   Heart disease Other    Diabetes Other    Hyperlipidemia Other    Hypertension Sister    Breast cancer Sister    Heart disease Father    Diabetes Father    Heart attack Father        x 2   Prostate cancer Neg Hx    Kidney disease Neg Hx    Social History   Socioeconomic History   Marital status: Married    Spouse name: Not on file   Number of children: 0   Years of education: Not on file   Highest education level: Associate degree:  occupational, Scientist, product/process development, or vocational program  Occupational History    Comment: disability, 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  Tobacco Use   Smoking status: Former   Smokeless tobacco: Never   Tobacco comments:    quit 35 years  Substance and Sexual Activity   Alcohol use: Yes    Alcohol/week: 0.0 standard drinks of alcohol    Comment: occasional   Drug use: No   Sexual activity: Yes    Birth control/protection: Condom  Other Topics Concern   Not on file  Social History Narrative   Lives with wife   Caffeine- coffee 1-2 cups, rarely a soda   Social Determinants of Health   Financial Resource Strain: Low Risk  (01/10/2023)   Overall Financial Resource Strain (CARDIA)    Difficulty of Paying Living Expenses: Not hard at all  Food Insecurity: No Food Insecurity (01/10/2023)   Hunger Vital Sign    Worried About Running Out of Food in the Last Year: Never true    Ran Out of Food in the Last Year: Never true  Transportation Needs: No Transportation Needs (01/10/2023)   PRAPARE - Administrator, Civil Service (Medical): No    Lack of Transportation (Non-Medical): No  Physical Activity: Insufficiently Active (01/10/2023)   Exercise Vital Sign    Days of Exercise per Week: 7 days    Minutes of Exercise per Session: 10 min  Stress: No Stress Concern Present (01/10/2023)   [employer] of  Occupational Health - Occupational Stress Questionnaire    Feeling of Stress : Not at all  Recent Concern: Stress - Stress Concern Present (12/22/2022)   [employer] of Occupational Health - Occupational Stress Questionnaire    Feeling of Stress : Rather much  Social Connections: Moderately Isolated (01/10/2023)   Social Connection and Isolation Panel [NHANES]    Frequency of Communication with Friends and Family: More than three times a week    Frequency of Social Gatherings with Friends and Family: More than three times a week    Attends Religious Services: Never    Database administrator or Organizations: No    Attends Engineer, structural: Never    Marital Status: Married    Tobacco Counseling Counseling given: Not Answered Tobacco comments: quit 35 years   Clinical Intake:  Pre-visit preparation completed: Yes  Pain : No/denies pain     BMI - recorded: 24.63 Nutritional Status: BMI of 19-24  Normal Nutritional Risks: None Diabetes: No  How often do you need to have someone help you when you read instructions, pamphlets, or other written materials from your doctor or pharmacy?: 1 - Never  Diabetic?  No  Interpreter Needed?: No  Information entered by :: Theresa Mulligan LPN   Activities of Daily Living    01/10/2023    1:39 PM 01/09/2023    4:58 PM  In your present state of health, do you have any difficulty performing the following activities:  Hearing? 0 0  Vision? 0 0  Difficulty concentrating or making decisions? 0 0  Walking or climbing stairs? 1 1  Comment Dx Neuropathy   Dressing or bathing? 0 0  Doing errands, shopping? 1 1  Comment Due to sleep issues.Followed by PCP   Preparing Food and eating ? N Y  Using the Toilet? N N  In the past six months, have you accidently leaked urine? N N  Do you have problems with loss of bowel control? N N  Managing your Medications? N N  Managing your Finances? N N  Housekeeping or managing your  Housekeeping? [place]    Patient Care Team: Nelwyn Salisbury, MD as PCP - General (Family Medicine) Marykay Lex, MD as PCP - Cardiology (Cardiology) Lutricia Feil, [facility] (Pharmacist)  Indicate any recent Medical Services you may have received from other than [facility] providers in the past year (date may be approximate).     Assessment:   This is a routine wellness examination for [place].  Hearing/Vision screen Hearing Screening - Comments:: Denies hearing difficulties   Vision Screening - Comments:: Wears rx glasses - up to date with routine eye exams with  [facility]  Dietary issues and exercise activities discussed: Current Exercise Habits: Home exercise routine, Type of exercise: walking, Time (Minutes): 10, Frequency (Times/Week): 3, Weekly Exercise (Minutes/Week): 30, Intensity: Mild, Exercise limited by: cardiac condition(s);neurologic condition(s)   Goals Addressed               This Visit's Progress     Increase physical activity (pt-stated)         Depression Screen    01/10/2023    1:38 PM 12/14/2022    4:29 PM 12/31/2021    2:01 PM 11/09/2021    2:46 PM 09/16/2020    4:01 PM 04/03/2019   10:47 AM  PHQ 2/9 Scores  PHQ - 2 Score 0 0 0 PHQ- 9 Score 0 2 0 2 2     Fall Risk    01/10/2023    1:46 PM 01/09/2023    4:58 PM 01/04/2023    2:39 PM 12/14/2022    4:29 PM 12/31/2021    2:05 PM  Fall Risk   Falls in the past year? 0 0 0 0 1  Number falls in past yr: 0 0 0 0 0  Injury with Fall? 0 0 0 0 0  Comment     No noted injury or medical attention needed  Risk for fall due to : No Fall Risks   No Fall Risks No Fall Risks  Follow up Falls prevention discussed   Falls evaluation completed     FALL RISK PREVENTION PERTAINING TO THE HOME:  Any stairs in or around the home? Yes  If so, are there any without handrails? No  Home free of loose throw rugs in walkways, pet beds, electrical cords, etc? Yes  Adequate lighting in your home to reduce risk of falls?  Yes   ASSISTIVE DEVICES UTILIZED TO PREVENT FALLS:  Life alert? No  Use of a cane, walker or w/c? No  Grab bars in the bathroom? Yes  Shower chair or bench in shower? Yes  Elevated toilet seat or a handicapped toilet? Yes   TIMED UP AND GO:  Was the test performed? No . Audio Visit  Cognitive Function:        01/10/2023    1:50 PM 12/31/2021    2:08 PM  6CIT Screen  What Year? 0 points 0 points  What month? 0 points 0 points  What time? 0 points 0 points  Count back from 20 0 points 0 points  Months in reverse 0 points 0 points  Repeat phrase 0 points 0 points  Total Score 0 points 0 points    Immunizations Immunization History  Administered Date(s) Administered   Influenza Split 08/26/2013   Influenza Whole 07/02/2008, 07/20/2010   Influenza, Quadrivalent, Recombinant, Inj, Pf 09/04/2018   Influenza,inj,Quad PF,6+ Mos 06/24/2014, 09/09/2015, 10/26/2016, 09/04/2018, 06/05/2020   Influenza-Unspecified 07/13/2021,  08/12/2022   MMR 04/12/2018   PFIZER(Purple Top)SARS-COV-2 Vaccination 12/10/2019, 12/31/2019, 08/16/2020   Td 09/27/2000   Tdap 09/16/2011, 11/09/2021   Unspecified SARS-COV-2 Vaccination 08/12/2022   Zoster Recombinat (Shingrix) 11/20/2018, 05/23/2019    TDAP status: Up to date  Flu Vaccine status: Up to date  Pneumococcal vaccine status: Up to date  Covid-19 vaccine status: Completed vaccines  Qualifies for Shingles Vaccine? Yes   Zostavax completed Yes   Shingrix Completed?: Yes  Screening Tests Health Maintenance  Topic Date Due   COVID-19 Vaccine (5 - 2023-24 season) 01/26/2023 (Originally 10/07/2022)   INFLUENZA VACCINE  04/28/2023   Medicare Annual Wellness (AWV)  01/10/2024   COLONOSCOPY (Pts 45-15yrs Insurance coverage will need to be confirmed)  10/27/2030   DTaP/Tdap/Td (4 - Td or Tdap) 11/10/2031   Hepatitis C Screening  Completed   HIV Screening  Completed   Zoster Vaccines- Shingrix  Completed   HPV VACCINES  Aged Out     Health Maintenance  There are no preventive care reminders to display for this patient.   Colorectal cancer screening: Type of screening: Colonoscopy. Completed 10/27/20. Repeat every 10 years  Lung Cancer Screening: (Low Dose CT Chest recommended if Age 72-80 years, 30 pack-year currently smoking OR have quit w/in 15years.) does not qualify.    Additional Screening:  Hepatitis C Screening: does qualify; Completed 01/01/13  Vision Screening: Recommended annual ophthalmology exams for early detection of glaucoma and other disorders of the eye. Is the patient up to date with their annual eye exam?  Yes  Who is the provider or what is the name of the office in which the patient attends annual eye exams? [facility] If pt is not established with a provider, would they like to be referred to a provider to establish care? No .   Dental Screening: Recommended annual dental exams for proper oral hygiene  Community Resource Referral / Chronic Care Management:  CRR required this visit?  No   CCM required this visit?  No      Plan:     I have personally reviewed and noted the following in the patient's chart:   Medical and social history Use of alcohol, tobacco or illicit drugs  Current medications and supplements including opioid prescriptions. Patient is currently taking opioids Functional ability and status Nutritional status Physical activity Advanced directives List of other physicians Hospitalizations, surgeries, and ER visits in previous 12 months Vitals Screenings to include cognitive, depression, and falls Referrals and appointments  In addition, I have reviewed and discussed with patient certain preventive protocols, quality metrics, and best practice recommendations. A written personalized care plan for preventive services as well as general preventive health recommendations were provided to patient.     Tillie Rung, LPN   [id number]   Nurse Notes: Patient  request f/u with concerns of continued neck pain would like advised referral.

## 2023-01-17 ENCOUNTER — Other Ambulatory Visit: Payer: Self-pay | Admitting: *Deleted

## 2023-01-17 ENCOUNTER — Ambulatory Visit: Payer: PPO | Attending: Cardiology | Admitting: Cardiology

## 2023-01-17 ENCOUNTER — Encounter: Payer: Self-pay | Admitting: Cardiology

## 2023-01-17 VITALS — BP 116/72 | HR 82 | Ht 68.0 in | Wt 165.0 lb

## 2023-01-17 DIAGNOSIS — R5383 Other fatigue: Secondary | ICD-10-CM | POA: Diagnosis not present

## 2023-01-17 DIAGNOSIS — R739 Hyperglycemia, unspecified: Secondary | ICD-10-CM | POA: Diagnosis not present

## 2023-01-17 DIAGNOSIS — I214 Non-ST elevation (NSTEMI) myocardial infarction: Secondary | ICD-10-CM

## 2023-01-17 DIAGNOSIS — K5903 Drug induced constipation: Secondary | ICD-10-CM

## 2023-01-17 DIAGNOSIS — E785 Hyperlipidemia, unspecified: Secondary | ICD-10-CM

## 2023-01-17 DIAGNOSIS — F419 Anxiety disorder, unspecified: Secondary | ICD-10-CM | POA: Diagnosis not present

## 2023-01-17 DIAGNOSIS — I25119 Atherosclerotic heart disease of native coronary artery with unspecified angina pectoris: Secondary | ICD-10-CM

## 2023-01-17 DIAGNOSIS — E782 Mixed hyperlipidemia: Secondary | ICD-10-CM

## 2023-01-17 DIAGNOSIS — F32A Depression, unspecified: Secondary | ICD-10-CM

## 2023-01-17 MED ORDER — METOPROLOL SUCCINATE ER 25 MG PO TB24: 25.0000 mg | ORAL_TABLET | Freq: Every day | ORAL | 2 refills | Status: DC

## 2023-01-17 NOTE — Progress Notes (Unsigned)
Primary Care Provider: Nelwyn Salisbury, MD [facility] Cardiologist: Bryan Lemma, MD Electrophysiologist: None  Clinic Note: Chief Complaint  Patient presents with   Pain    Neck     ===================================  ASSESSMENT/PLAN   Problem List Items Addressed This Visit   None   ===================================  HPI:    [name] is a 62 y.o. male with a PMH Notable for CAD-non-STEMI 12/11/2022 (PCI to LCx), HTN, HLD along with chronic anxiety and chronic pain who presents today for 1 month follow-up. at the request of Nelwyn Salisbury, MD.  [name] was last seen on December 24, 2022 by Joni Reining, NP.  He was noting recurrent chest comfort which she was taking nitroglycerin.  He had multiple questions about the procedure etc. => York Spaniel he was having headaches that he thought was related to Brilinta.  Discussed possibly switching to clopidogrel.  Suggested taking Brilinta with caffeine.  But denies any shortness of breath.  Recent Hospitalizations: ***  Reviewed  CV studies:    The following studies were reviewed today: (if available, images/films reviewed: From Epic Chart or Care Everywhere)  LHC 12/10/2022 Left Main The vessel exhibits minimal luminal irregularities.   Left Anterior Descending There is mild diffuse disease throughout the vessel.   Left Circumflex Mid Cx lesion is 99% stenosed. => PCI using a 2.25 x 24 mm Synergy DES    Right Coronary Artery There is mild diffuse disease throughout the vessel. Prox RCA lesion is 30% stenosed. Mid RCA lesion is 50% stenosed. The lesion is eccentric.  Intervention: PCI LCx: 2.25 x 24 mm Synergy DES       Echocardiogram 12/11/2022 1. Left ventricular ejection fraction, by estimation, is 60 to 65%. The left ventricle has normal function. The left ventricle has no regional  wall motion abnormalities. Left ventricular diastolic parameters are consistent with Grade I  diastolic  dysfunction (impaired relaxation).   2. Right ventricular systolic function is normal. The right ventricular size is normal. There is normal pulmonary artery systolic pressure.   3. No evidence of mitral valve regurgitation.   4. Aortic valve regurgitation is not visualized.   5. The inferior vena cava is normal in size with greater than 50% w/ respiratory variability, suggesting right atrial pressure of 3 mmHg.   Interval History:   [name]   CV Review of Symptoms (Summary): {roscv:310661}  REVIEWED OF SYSTEMS   ROS     I have reviewed and (if needed) personally updated the patient's problem list, medications, allergies, past medical and surgical history, social and family history.   PAST MEDICAL HISTORY   Past Medical History:  Diagnosis Date   ADHD (attention deficit hyperactivity disorder), inattentive type    sees Dr. Deboraha Sprang in [place], [place]    Anxiety    sees Dr. Deboraha Sprang in [place], [place]    Arthritis    Benign enlargement of prostate    sees [facility]    Carpal tunnel syndrome on left    sees Dr. Kathie Dike at [facility]    Chronic pain    sees Dr. Thyra Breed (meds) and [facility] (accupuncture)    Depression    sees Dr. Deboraha Sprang   Functional impotence    Hypogonadism in male    sees Carollee Herter (a [place]) at [facility]    Insomnia    Kidney stones    Sinusitis, acute 07/17/2016   Urticaria due to cold     PAST  SURGICAL HISTORY   Past Surgical History:  Procedure Laterality Date   APPENDECTOMY     CERVICAL FUSION  2008   C6-7 per Dr. Sharolyn Douglas   COLONOSCOPY  11/06/2009   per Dr. Dorena Cookey, no polyps, repeat in 10 yrs    CORONARY STENT INTERVENTION N/A 12/10/2022   Procedure: CORONARY STENT INTERVENTION;  Surgeon: Tonny Bollman, MD;  Location: [facility] INVASIVE CV LAB;  Service: Cardiovascular;  Laterality: N/A;   FOOT SURGERY Right    x 2   LEFT HEART CATH AND CORONARY ANGIOGRAPHY N/A 12/10/2022    Procedure: LEFT HEART CATH AND CORONARY ANGIOGRAPHY;  Surgeon: Tonny Bollman, MD;  Location: [facility] INVASIVE CV LAB;  Service: Cardiovascular;  Laterality: N/A;   LUMBAR DISC SURGERY     L5-S1, C 6-6    MEDICATIONS/ALLERGIES   Current Meds  Medication Sig   amLODipine (NORVASC) 10 MG tablet TAKE 1 TABLET BY MOUTH DAILY (Patient taking differently: Take 10 mg by mouth every evening.)   aspirin EC 81 MG tablet Take 1 tablet (81 mg total) by mouth daily. Swallow whole.   atorvastatin (LIPITOR) 80 MG tablet Take 1 tablet (80 mg total) by mouth daily.   baclofen (LIORESAL) 20 MG tablet Take 1 tablet (20 mg total) by mouth every 8 (eight) hours as needed for muscle spasms.   diazepam (VALIUM) 10 MG tablet Take 10 mg by mouth 2 (two) times daily.   fentaNYL (DURAGESIC - DOSED MCG/HR) 50 MCG/HR Place 50 mcg onto the skin every other day.   lactulose, encephalopathy, (CHRONULAC) 10 GM/15ML SOLN Take 20 g by mouth at bedtime.   metoprolol succinate (TOPROL-XL) 25 MG 24 hr tablet Take 1 tablet (25 mg total) by mouth daily.   nitroGLYCERIN (NITROSTAT) 0.4 MG SL tablet Place 1 tablet (0.4 mg total) under the tongue every 5 (five) minutes as needed for chest pain.   promethazine (PHENERGAN) 25 MG tablet Take 12.5 mg by mouth every 6 (six) hours as needed for nausea or vomiting.   ticagrelor (BRILINTA) 90 MG TABS tablet Take 1 tablet (90 mg total) by mouth 2 (two) times daily.   traZODone (DESYREL) 100 MG tablet Take 50 mg by mouth at bedtime.    Allergies  Allergen Reactions   Fire Ant Anaphylaxis    Has Epi pen Has Epi pen    Codeine     REACTION: ITCHING   Cyclobenzaprine Other (See Comments)    Nightmares   Docusate Sodium     swelling   Dulcolax Stool Softener [Dss]     swelling   Gabapentin     migraine   Ketorolac     Other reaction(s): Vomiting (intolerance)   Ketorolac Tromethamine Other (See Comments)    REACTION: SEVERE VOMMITING   Lisinopril     Cough & chest tightness    Methadone Other (See Comments)    Loss of appetite   Pregabalin     headaches Other reaction(s): Confusion (intolerance)    SOCIAL HISTORY/FAMILY HISTORY   Reviewed in Epic:  Pertinent findings:  Social History   Tobacco Use   Smoking status: Former   Smokeless tobacco: Never   Tobacco comments:    quit 35 years  Substance Use Topics   Alcohol use: Yes    Alcohol/week: 0.0 standard drinks of alcohol    Comment: occasional   Drug use: No   Social History   Social History Narrative   Lives with wife   Caffeine- coffee 1-2 cups, rarely a soda  OBJCTIVE -PE, EKG, labs   Wt Readings from Last 3 Encounters:  01/17/23 165 lb (74.8 kg)  01/10/23 162 lb (73.5 kg)  12/24/22 162 lb 6.4 oz (73.7 kg)    Physical Exam: BP 116/72 (BP Location: Left Arm, Patient Position: Sitting, Cuff Size: Normal)   Pulse 82   Ht  (1.727 m)   Wt 165 lb (74.8 kg)   SpO2 97%   BMI 25.09 kg/m  Physical Exam   Adult ECG Report  Rate: *** ;  Rhythm: {rhythm:17366};   Narrative Interpretation: ***  Recent Labs:  ***  Lab Results  Component Value Date   CHOL 238 (H) 12/10/2022   HDL 61 12/10/2022   LDLCALC 163 (H) 12/10/2022   LDLDIRECT 179.9 12/18/2012   TRIG 71 12/10/2022   CHOLHDL 3.9 12/10/2022   Lab Results  Component Value Date   CREATININE 0.97 12/11/2022   BUN 10 12/11/2022   NA 133 (L) 12/11/2022   K 3.5 12/11/2022   CL 100 12/11/2022   CO2 23 12/11/2022      Latest Ref Rng & Units 12/11/2022    1:13 AM 12/10/2022   11:02 AM 11/09/2021    1:58 PM  CBC  WBC 4.0 - 10.5 K/uL 9.4  7.9  8.8   Hemoglobin 13.0 - 17.0 g/dL 16.1  09.6  04.5   Hematocrit 39.0 - 52.0 % 47.5  48.8  51.8   Platelets 150 - 400 K/uL 195  225  201.0     Lab Results  Component Value Date   HGBA1C 5.9 (H) 12/11/2022   Lab Results  Component Value Date   TSH 1.77 11/09/2021    ================================================== I spent a total of ***minutes with the patient spent in  direct patient consultation.  Additional time spent with chart review  / charting (studies, outside notes, etc): *** min Total Time: *** min  Current medicines are reviewed at length with the patient today.  (+/- concerns) ***  Notice: This dictation was prepared with Dragon dictation along with smart phrase technology. Any transcriptional errors that result from this process are unintentional and may not be corrected upon review.  Studies Ordered:   No orders of the defined types were placed in this encounter.  No orders of the defined types were placed in this encounter.   Patient Instructions / Medication Changes & Studies & Tests Ordered   There are no Patient Instructions on file for this visit.     Marykay Lex, MD, MS Bryan Lemma, M.D., M.S. Interventional Cardiologist  [facility]  Pager # [phone] Phone # [phone] [address] [place], [place] [address]   Thank you for choosing [facility] [place]!!

## 2023-01-17 NOTE — Patient Instructions (Addendum)
Medication Instructions:    Take Toprol Xl  at bedtime      You can use  Maalox  or magnesium citrate for constipation as needed  *If you need a refill on your cardiac medications before your next appointment, please call your pharmacy*   Lab Work:  In late May or June 2024- fasting  Liver function test  LIPID If you have labs (blood work) drawn today and your tests are completely normal, you will receive your results only by: MyChart Message (if you have MyChart) OR A paper copy in the mail If you have any lab test that is abnormal or we need to change your treatment, we will call you to review the results.   Testing/Procedures:  Not needed  Follow-Up: At [facility], you and your health needs are our priority.  As part of our continuing mission to provide you with exceptional heart care, we have created designated Provider Care Teams.  These Care Teams include your primary Cardiologist (physician) and Advanced Practice Providers (APPs -  Physician Assistants and Nurse Practitioners) who all work together to provide you with the care you need, when you need it.     Your next appointment:    4 to 6 month(s)  The format for your next appointment:   In Person  Provider:   Bryan Lemma, MD    Other Instructions   If any bruising  or bleeding that you need to hold pressure - you can  not take Asprin for 3 to 4 days

## 2023-01-18 NOTE — Progress Notes (Incomplete)
Primary Care Provider: Nelwyn Salisbury, MD [facility] Cardiologist: Bryan Lemma, MD Electrophysiologist: None  Clinic Note: Chief Complaint  Patient presents with  . Pain    Neck     ===================================  ASSESSMENT/PLAN   Problem List Items Addressed This Visit   None   ===================================  HPI:    [name] is a 62 y.o. male with a PMH Notable for CAD-non-STEMI 12/11/2022 (PCI to LCx), HTN, HLD along with Chronic Anxiety and Chronic Pain who presents today for 1 month follow-up. at the request of Nelwyn Salisbury, MD.  [name] was last seen on December 24, 2022 by Joni Reining, NP.  He was noting recurrent chest comfort which she was taking nitroglycerin.  He had multiple questions about the procedure etc. => York Spaniel he was having headaches that he thought was related to Brilinta.  Discussed possibly switching to clopidogrel.  Suggested taking Brilinta with caffeine.  But denies any shortness of breath.  Recent Hospitalizations:  Non-STEMI 12/10/2022 -> subtotal LCx-PCI.  Normal EF.  Reviewed  CV studies:    The following studies were reviewed today: (if available, images/films reviewed: From Epic Chart or Care Everywhere)  LHC 12/10/2022:  Minimal LM & LAD disease. Mid Cx 99% (subtotal occlusion) =>  PCI using a 2.25 x 24 mm Synergy DES; pRCA 30% & mRCA 50%.  Intervention: PCI LCx: 2.25 x 24 mm Synergy DES       Echocardiogram 12/11/2022: Open non-STEMI) normal LV size and function.  EF 60 to 65%.  No RWMA.  GR 1 DD.  Normal RV size and function.  Normal PAP and RAP.  Normal valves.--Normal echo.   Interval History:   [name]   CV Review of Symptoms (Summary): {roscv:310661}  REVIEWED OF SYSTEMS   ROS     I have reviewed and (if needed) personally updated the patient's problem list, medications, allergies, past medical and surgical history, social and family history.   PAST MEDICAL  HISTORY   Past Medical History:  Diagnosis Date  . ADHD (attention deficit hyperactivity disorder), inattentive type    sees Dr. Deboraha Sprang in [place], [place]   . Anxiety    sees Dr. Deboraha Sprang in [place], [place]   . Arthritis   . Benign enlargement of prostate    sees [facility]   . Carpal tunnel syndrome on left    sees Dr. Kathie Dike at [facility]   . Chronic pain    sees Dr. Thyra Breed (meds) and [facility] (accupuncture)   . Depression    sees Dr. Deboraha Sprang  . Functional impotence   . Hypogonadism in male    sees Carollee Herter (a [place]) at [facility]   . Insomnia   . Kidney stones   . Sinusitis, acute 07/17/2016  . Urticaria due to cold     PAST SURGICAL HISTORY   Past Surgical History:  Procedure Laterality Date  . APPENDECTOMY    . CERVICAL FUSION  2008   C6-7 per Dr. Sharolyn Douglas  . COLONOSCOPY  11/06/2009   per Dr. Dorena Cookey, no polyps, repeat in 10 yrs   . CORONARY STENT INTERVENTION N/A 12/10/2022   Procedure: CORONARY STENT INTERVENTION;  Surgeon: Tonny Bollman, MD;  Location: [facility] INVASIVE CV LAB;  Service: Cardiovascular;  Laterality: N/A;  . FOOT SURGERY Right    x 2  . LEFT HEART CATH AND CORONARY ANGIOGRAPHY N/A 12/10/2022   Procedure: LEFT HEART CATH AND CORONARY ANGIOGRAPHY;  Surgeon:  Tonny Bollman, MD;  Location: [facility] INVASIVE CV LAB;  Service: Cardiovascular;  Laterality: N/A;  . LUMBAR DISC SURGERY     L5-S1, C 6-6    MEDICATIONS/ALLERGIES   Current Meds  Medication Sig  . amLODipine (NORVASC) 10 MG tablet TAKE 1 TABLET BY MOUTH DAILY (Patient taking differently: Take 10 mg by mouth every evening.)  . aspirin EC 81 MG tablet Take 1 tablet (81 mg total) by mouth daily. Swallow whole.  Marland Kitchen atorvastatin (LIPITOR) 80 MG tablet Take 1 tablet (80 mg total) by mouth daily.  . baclofen (LIORESAL) 20 MG tablet Take 1 tablet (20 mg total) by mouth every 8 (eight) hours as needed for muscle spasms.  . diazepam (VALIUM) 10 MG tablet Take 10 mg  by mouth 2 (two) times daily.  . fentaNYL (DURAGESIC - DOSED MCG/HR) 50 MCG/HR Place 50 mcg onto the skin every other day.  . lactulose, encephalopathy, (CHRONULAC) 10 GM/15ML SOLN Take 20 g by mouth at bedtime.  . metoprolol succinate (TOPROL-XL) 25 MG 24 hr tablet Take 1 tablet (25 mg total) by mouth daily.  . nitroGLYCERIN (NITROSTAT) 0.4 MG SL tablet Place 1 tablet (0.4 mg total) under the tongue every 5 (five) minutes as needed for chest pain.  . promethazine (PHENERGAN) 25 MG tablet Take 12.5 mg by mouth every 6 (six) hours as needed for nausea or vomiting.  . ticagrelor (BRILINTA) 90 MG TABS tablet Take 1 tablet (90 mg total) by mouth 2 (two) times daily.  . traZODone (DESYREL) 100 MG tablet Take 50 mg by mouth at bedtime.    Allergies  Allergen Reactions  . Fire [employer] Anaphylaxis    Has Epi pen Has Epi pen   . Codeine     REACTION: ITCHING  . Cyclobenzaprine Other (See Comments)    Nightmares  . Docusate Sodium     swelling  . Dulcolax Stool Softener [Dss]     swelling  . Gabapentin     migraine  . Ketorolac     Other reaction(s): Vomiting (intolerance)  . Ketorolac Tromethamine Other (See Comments)    REACTION: SEVERE VOMMITING  . Lisinopril     Cough & chest tightness  . Methadone Other (See Comments)    Loss of appetite  . Pregabalin     headaches Other reaction(s): Confusion (intolerance)    SOCIAL HISTORY/FAMILY HISTORY   Reviewed in Epic:  Pertinent findings:  Social History   Tobacco Use  . Smoking status: Former  . Smokeless tobacco: Never  . Tobacco comments:    quit 35 years  Substance Use Topics  . Alcohol use: Yes    Alcohol/week: 0.0 standard drinks of alcohol    Comment: occasional  . Drug use: No   Social History   Social History Narrative   Lives with wife   Caffeine- coffee 1-2 cups, rarely a soda    OBJCTIVE -PE, EKG, labs   Wt Readings from Last 3 Encounters:  01/17/23 165 lb (74.8 kg)  01/10/23 162 lb (73.5 kg)  12/24/22  162 lb 6.4 oz (73.7 kg)    Physical Exam: BP 116/72 (BP Location: Left Arm, Patient Position: Sitting, Cuff Size: Normal)   Pulse 82   Ht  (1.727 m)   Wt 165 lb (74.8 kg)   SpO2 97%   BMI 25.09 kg/m  Physical Exam   Adult ECG Report  Rate: *** ;  Rhythm: {rhythm:17366};   Narrative Interpretation: ***  Recent Labs:  ***  Lab Results  Component  Value Date   CHOL 238 (H) 12/10/2022   HDL 61 12/10/2022   LDLCALC 163 (H) 12/10/2022   LDLDIRECT 179.9 12/18/2012   TRIG 71 12/10/2022   CHOLHDL 3.9 12/10/2022   Lab Results  Component Value Date   CREATININE 0.97 12/11/2022   BUN 10 12/11/2022   NA 133 (L) 12/11/2022   K 3.5 12/11/2022   CL 100 12/11/2022   CO2 23 12/11/2022      Latest Ref Rng & Units 12/11/2022    1:13 AM 12/10/2022   11:02 AM 11/09/2021    1:58 PM  CBC  WBC 4.0 - 10.5 K/uL 9.4  7.9  8.8   Hemoglobin 13.0 - 17.0 g/dL 16.1  09.6  04.5   Hematocrit 39.0 - 52.0 % 47.5  48.8  51.8   Platelets 150 - 400 K/uL 195  225  201.0     Lab Results  Component Value Date   HGBA1C 5.9 (H) 12/11/2022   Lab Results  Component Value Date   TSH 1.77 11/09/2021    ================================================== I spent a total of ***minutes with the patient spent in direct patient consultation.  Additional time spent with chart review  / charting (studies, outside notes, etc): *** min Total Time: *** min  Current medicines are reviewed at length with the patient today.  (+/- concerns) ***  Notice: This dictation was prepared with Dragon dictation along with smart phrase technology. Any transcriptional errors that result from this process are unintentional and may not be corrected upon review.  Studies Ordered:   No orders of the defined types were placed in this encounter.  No orders of the defined types were placed in this encounter.   Patient Instructions / Medication Changes & Studies & Tests Ordered   There are no Patient Instructions on file  for this visit.     Marykay Lex, MD, MS Bryan Lemma, M.D., M.S. Interventional Cardiologist  [facility]  Pager # [phone] Phone # [phone] [address] [place], [place] [address]   Thank you for choosing [facility] [place]!!

## 2023-01-19 ENCOUNTER — Encounter: Payer: Self-pay | Admitting: Cardiology

## 2023-01-19 VITALS — Ht 68.5 in | Wt 163.8 lb

## 2023-01-19 DIAGNOSIS — Z5189 Encounter for other specified aftercare: Secondary | ICD-10-CM | POA: Diagnosis not present

## 2023-01-19 DIAGNOSIS — I214 Non-ST elevation (NSTEMI) myocardial infarction: Secondary | ICD-10-CM

## 2023-01-19 DIAGNOSIS — K5900 Constipation, unspecified: Secondary | ICD-10-CM | POA: Insufficient documentation

## 2023-01-19 DIAGNOSIS — Z955 Presence of coronary angioplasty implant and graft: Secondary | ICD-10-CM

## 2023-01-19 DIAGNOSIS — R5383 Other fatigue: Secondary | ICD-10-CM | POA: Insufficient documentation

## 2023-01-19 DIAGNOSIS — I252 Old myocardial infarction: Secondary | ICD-10-CM | POA: Diagnosis not present

## 2023-01-19 NOTE — Assessment & Plan Note (Signed)
A1c 5.9-borderline diabetes.  Needs to monitor diet and exercise.  He is making adjustments with diet which should help.

## 2023-01-19 NOTE — Assessment & Plan Note (Signed)
He is complaining of significant constipation that is worse since starting on the his cardiac medications.  He is had issues with constipation because of chronic pain meds but now he is having more symptoms.  He is eating lots of fruit and taking MiraLAX.  Since he has had some unusual aches and cramps pains.  I think he may benefit from magnesium supplementation, perhaps using a magnesium formulation such as Maalox or Mag-Ox/mag citrate would be helpful.

## 2023-01-19 NOTE — Progress Notes (Signed)
Cardiac Individual Treatment Plan  Patient Details  Name: [name] MRN: [id number] Date of Birth: 17-Jun-1961 Referring Provider:   Flowsheet Row Cardiac Rehab from 01/19/2023 in [facility] Cardiac and Pulmonary Rehab  Referring Provider Dr. Bryan Lemma, MD       Initial Encounter Date:  Flowsheet Row Cardiac Rehab from 01/19/2023 in [facility] Cardiac and Pulmonary Rehab  Date 01/19/23       Visit Diagnosis: NSTEMI (non-ST elevated myocardial infarction)  Status post coronary artery stent placement  [name] Home Medications on Admission:  Current Outpatient Medications:    acyclovir (ZOVIRAX) 800 MG tablet, Take 1 tablet (800 mg total) by mouth 2 (two) times daily. (Patient not taking: Reported on 01/17/2023), Disp: 180 tablet, Rfl: 3   amLODipine (NORVASC) 10 MG tablet, TAKE 1 TABLET BY MOUTH DAILY (Patient taking differently: Take 10 mg by mouth every evening.), Disp: 90 tablet, Rfl: 3   aspirin EC 81 MG tablet, Take 1 tablet (81 mg total) by mouth daily. Swallow whole., Disp: 30 tablet, Rfl: 12   atorvastatin (LIPITOR) 80 MG tablet, Take 1 tablet (80 mg total) by mouth daily., Disp: 90 tablet, Rfl: 2   baclofen (LIORESAL) 20 MG tablet, Take 1 tablet (20 mg total) by mouth every 8 (eight) hours as needed for muscle spasms., Disp: 60 each, Rfl: 5   cetirizine (ZYRTEC) 10 MG tablet, Take 10 mg by mouth daily. (Patient not taking: Reported on 01/17/2023), Disp: , Rfl:    diazepam (VALIUM) 10 MG tablet, Take 10 mg by mouth 2 (two) times daily., Disp: , Rfl:    diclofenac sodium (VOLTAREN) 1 % GEL, Apply 1 application  topically daily as needed (For pain). (Patient not taking: Reported on 01/17/2023), Disp: , Rfl:    EPINEPHrine 0.3 mg/0.3 mL IJ SOAJ injection, USE AS DIRECTED (Patient not taking: Reported on 01/17/2023), Disp: 2 each, Rfl: 1   fentaNYL (DURAGESIC - DOSED MCG/HR) 50 MCG/HR, Place 50 mcg onto the skin every other day., Disp: , Rfl:    fluticasone (FLONASE) 50 MCG/ACT  nasal spray, USE TWO SPRAYS IN EACH NOSTRIL DAILY (Patient not taking: Reported on 01/17/2023), Disp: 16 g, Rfl: 11   lactulose, encephalopathy, (CHRONULAC) 10 GM/15ML SOLN, Take 20 g by mouth at bedtime., Disp: , Rfl:    metoprolol succinate (TOPROL-XL) 25 MG 24 hr tablet, Take 1 tablet (25 mg total) by mouth at bedtime., Disp: 90 tablet, Rfl: 2   nitroGLYCERIN (NITROSTAT) 0.4 MG SL tablet, Place 1 tablet (0.4 mg total) under the tongue every 5 (five) minutes as needed for chest pain., Disp: 100 tablet, Rfl: 3   oxyCODONE (OXY IR/ROXICODONE) 5 MG immediate release tablet, Take 5 mg by mouth every 6 (six) hours as needed for moderate pain. (Patient not taking: Reported on 01/17/2023), Disp: , Rfl: 0   promethazine (PHENERGAN) 25 MG tablet, Take 12.5 mg by mouth every 6 (six) hours as needed for nausea or vomiting., Disp: , Rfl:    ticagrelor (BRILINTA) 90 MG TABS tablet, Take 1 tablet (90 mg total) by mouth 2 (two) times daily., Disp: 60 tablet, Rfl: 11   traZODone (DESYREL) 100 MG tablet, Take 50 mg by mouth at bedtime., Disp: , Rfl:    triamcinolone cream (KENALOG) 0.1 %, Apply 1 application topically 2 (two) times daily. (Patient not taking: Reported on 01/17/2023), Disp: 30 g, Rfl: 0  Past Medical History: Past Medical History:  Diagnosis Date   ADHD (attention deficit hyperactivity disorder), inattentive type    sees Dr. Trey Paula  [facility] in [place], [place]    Anxiety    sees Dr. Deboraha Sprang in [place], [place]    Arthritis    Benign enlargement of prostate    sees [facility]    CAD- 1 Vessel, DES PCI LCx. (NSTEMI) 12/10/2022   LHC 12/10/2022:  Minimal LM & LAD disease. Mid Cx 99% (subtotal occlusion) =>  PCI using a 2.25 x 24 mm Synergy DES; pRCA 30% & mRCA 50%.      Carpal tunnel syndrome on left    sees Dr. Kathie Dike at [facility]    Chronic pain    sees Dr. Thyra Breed (meds) and Altamease Oiler (accupuncture)    Depression    sees Dr. Deboraha Sprang   Functional impotence     Hyperlipidemia with target LDL less than 70 07/09/2014   Hypogonadism in male    sees Carollee Herter (a [place]) at [facility]    Insomnia    Kidney stones    NSTEMI (non-ST elevated myocardial infarction) 12/10/2022   Admitted with stuttering chest pain, troponin elevation. => 9 9% LCx=> DES stent.  Normal EF on echo.   Sinusitis, acute 07/17/2016   Urticaria due to cold     Tobacco Use: Social History   Tobacco Use  Smoking Status Former  Smokeless Tobacco Never  Tobacco Comments   quit 35 years    Labs: Review Flowsheet  More data exists      Latest Ref Rng & Units 04/03/2019 06/05/2020 11/09/2021 12/10/2022 12/11/2022  Labs for ITP Cardiac and Pulmonary Rehab  Cholestrol 0 - 200 mg/dL 161  096  045  409  -  LDL (calc) 0 - 99 mg/dL 811  914  782  956  -  HDL-C >40 mg/dL 21.30  58  86.57  61  -  Trlycerides <150 mg/dL 846.9  629  528.4  71  -  Hemoglobin A1c 4.8 - 5.6 % - 5.8  6.0  - 5.9      Exercise Target Goals: Exercise Program Goal: Individual exercise prescription set using results from initial 6 min walk test and THRR while considering  patient's activity barriers and safety.   Exercise Prescription Goal: Initial exercise prescription builds to 30-45 minutes a day of aerobic activity, 2-3 days per week.  Home exercise guidelines will be given to patient during program as part of exercise prescription that the participant will acknowledge.   Education: Aerobic Exercise: - Group verbal and visual presentation on the components of exercise prescription. Introduces F.I.T.T principle from ACSM for exercise prescriptions.  Reviews F.I.T.T. principles of aerobic exercise including progression. Written material given at graduation. Flowsheet Row Cardiac Rehab from 01/19/2023 in [facility] Cardiac and Pulmonary Rehab  Education need identified 01/19/23       Education: Resistance Exercise: - Group verbal and visual presentation on the components of exercise prescription. Introduces  F.I.T.T principle from ACSM for exercise prescriptions  Reviews F.I.T.T. principles of resistance exercise including progression. Written material given at graduation.    Education: Exercise & Equipment Safety: - Individual verbal instruction and demonstration of equipment use and safety with use of the equipment. Flowsheet Row Cardiac Rehab from 01/19/2023 in [facility] Cardiac and Pulmonary Rehab  Date 01/19/23  Educator NT  Instruction Review Code 1- Verbalizes Understanding       Education: Exercise Physiology & General Exercise Guidelines: - Group verbal and written instruction with models to review the exercise physiology of the cardiovascular system and associated critical values. Provides general exercise guidelines with specific guidelines  to those with heart or lung disease.    Education: Flexibility, Balance, Mind/Body Relaxation: - Group verbal and visual presentation with interactive activity on the components of exercise prescription. Introduces F.I.T.T principle from ACSM for exercise prescriptions. Reviews F.I.T.T. principles of flexibility and balance exercise training including progression. Also discusses the mind body connection.  Reviews various relaxation techniques to help reduce and manage stress (i.e. Deep breathing, progressive muscle relaxation, and visualization). Balance handout provided to take home. Written material given at graduation.   Activity Barriers & Risk Stratification:  Activity Barriers & Cardiac Risk Stratification - 01/19/23 1536       Activity Barriers & Cardiac Risk Stratification   Activity Barriers Back Problems;Shortness of Breath;Neck/Spine Problems;Other (comment)    Comments Torn rotator cuff last year, neuropathy causes pain in feet    Cardiac Risk Stratification Moderate             6 Minute Walk:  6 Minute Walk     Row Name 01/19/23 1525         6 Minute Walk   Phase Initial     Distance 1750 feet     Walk Time 6 minutes      # of Rest Breaks 0     MPH 3.31     METS 4.52     RPE 11     Perceived Dyspnea  1     VO2 Peak 15.83     Symptoms Yes (comment)     Comments Neck Pain 3/10, Feet Pain 4/10     Resting HR 78 bpm     Resting BP 120/78     Resting Oxygen Saturation  97 %     Exercise Oxygen Saturation  during 6 min walk 95 %     Max Ex. HR 107 bpm     Max Ex. BP 134/74     2 Minute Post BP 112/68              Oxygen Initial Assessment:   Oxygen Re-Evaluation:   Oxygen Discharge (Final Oxygen Re-Evaluation):   Initial Exercise Prescription:  Initial Exercise Prescription - 01/19/23 1500       Date of Initial Exercise RX and Referring Provider   Date 01/19/23    Referring Provider Dr. Bryan Lemma, MD      Oxygen   Maintain Oxygen Saturation 88% or higher      Treadmill   MPH 2.8    Grade 1    Minutes 15    METs 3.53      Recumbant Bike   Level 3    RPM 50    Watts 59    Minutes 15    METs 4.52      NuStep   Level 4    SPM 80    Minutes 15    METs 4.52      REL-XR   Level 2    Speed 50    Minutes 15    METs 4.52      Prescription Details   Frequency (times per week) 2    Duration Progress to 30 minutes of continuous aerobic without signs/symptoms of physical distress      Intensity   THRR 40-80% of Max Heartrate 110-143    Ratings of Perceived Exertion 11-13    Perceived Dyspnea 0-4      Progression   Progression Continue to progress workloads to maintain intensity without signs/symptoms of physical distress.      Resistance Training  Training Prescription Yes    Weight 3 lb, 5 lb    Reps 10-15             Perform Capillary Blood Glucose checks as needed.  Exercise Prescription Changes:   Exercise Prescription Changes     Row Name 01/19/23 1500             Response to Exercise   Blood Pressure (Admit) 120/78       Blood Pressure (Exercise) 134/74       Blood Pressure (Exit) 112/68       Heart Rate (Admit) 78 bpm       Heart  Rate (Exercise) 107 bpm       Heart Rate (Exit) 81 bpm       Oxygen Saturation (Admit) 97 %       Oxygen Saturation (Exercise) 95 %       Oxygen Saturation (Exit) 97 %       Rating of Perceived Exertion (Exercise) 11       Perceived Dyspnea (Exercise) 1       Symptoms Neck pain 3/10, Feet Pain 4/10       Comments Results                Exercise Comments:   Exercise Goals and Review:   Exercise Goals     Row Name 01/19/23 1540             Exercise Goals   Increase Physical Activity Yes       Intervention Provide advice, education, support and counseling about physical activity/exercise needs.;Develop an individualized exercise prescription for aerobic and resistive training based on initial evaluation findings, risk stratification, comorbidities and participant's personal goals.       Expected Outcomes Short Term: Attend rehab on a regular basis to increase amount of physical activity.;Long Term: Add in home exercise to make exercise part of routine and to increase amount of physical activity.;Long Term: Exercising regularly at least 3-5 days a week.       Increase Strength and Stamina Yes       Intervention Provide advice, education, support and counseling about physical activity/exercise needs.;Develop an individualized exercise prescription for aerobic and resistive training based on initial evaluation findings, risk stratification, comorbidities and participant's personal goals.       Expected Outcomes Short Term: Increase workloads from initial exercise prescription for resistance, speed, and METs.;Short Term: Perform resistance training exercises routinely during rehab and add in resistance training at home;Long Term: Improve cardiorespiratory fitness, muscular endurance and strength as measured by increased METs and functional capacity ( )       Able to understand and use rate of perceived exertion (RPE) scale Yes       Intervention Provide education and explanation  on how to use RPE scale       Expected Outcomes Short Term: Able to use RPE daily in rehab to express subjective intensity level;Long Term:  Able to use RPE to guide intensity level when exercising independently       Able to understand and use Dyspnea scale Yes       Intervention Provide education and explanation on how to use Dyspnea scale       Expected Outcomes Short Term: Able to use Dyspnea scale daily in rehab to express subjective sense of shortness of breath during exertion;Long Term: Able to use Dyspnea scale to guide intensity level when exercising independently       Knowledge and understanding  of Target Heart Rate Range (THRR) Yes       Intervention Provide education and explanation of THRR including how the numbers were predicted and where they are located for reference       Expected Outcomes Long Term: Able to use THRR to govern intensity when exercising independently;Short Term: Able to state/look up THRR;Short Term: Able to use daily as guideline for intensity in rehab       Able to check pulse independently Yes       Intervention Provide education and demonstration on how to check pulse in carotid and radial arteries.;Review the importance of being able to check your own pulse for safety during independent exercise       Expected Outcomes Short Term: Able to explain why pulse checking is important during independent exercise;Long Term: Able to check pulse independently and accurately       Understanding of Exercise Prescription Yes       Intervention Provide education, explanation, and written materials on patient's individual exercise prescription       Expected Outcomes Short Term: Able to explain program exercise prescription;Long Term: Able to explain home exercise prescription to exercise independently                Exercise Goals Re-Evaluation :   Discharge Exercise Prescription (Final Exercise Prescription Changes):  Exercise Prescription Changes - 01/19/23 1500        Response to Exercise   Blood Pressure (Admit) 120/78    Blood Pressure (Exercise) 134/74    Blood Pressure (Exit) 112/68    Heart Rate (Admit) 78 bpm    Heart Rate (Exercise) 107 bpm    Heart Rate (Exit) 81 bpm    Oxygen Saturation (Admit) 97 %    Oxygen Saturation (Exercise) 95 %    Oxygen Saturation (Exit) 97 %    Rating of Perceived Exertion (Exercise) 11    Perceived Dyspnea (Exercise) 1    Symptoms Neck pain 3/10, Feet Pain 4/10    Comments Results             Nutrition:  Target Goals: Understanding of nutrition guidelines, daily intake of sodium 1500mg , cholesterol 200mg , calories 30% from fat and 7% or less from saturated fats, daily to have 5 or more servings of fruits and vegetables.  Education: All About Nutrition: -Group instruction provided by verbal, written material, interactive activities, discussions, models, and posters to present general guidelines for heart healthy nutrition including fat, fiber, MyPlate, the role of sodium in heart healthy nutrition, utilization of the nutrition label, and utilization of this knowledge for meal planning. Follow up email sent as well. Written material given at graduation.   Biometrics:  Pre Biometrics - 01/19/23 1542       Pre Biometrics   Height 5' 8.5" (1.74 m)    Weight 163 lb 12.8 oz (74.3 kg)    Waist Circumference 36.5 inches    Hip Circumference 36 inches    Waist to Hip Ratio 1.01 %    BMI (Calculated) 24.54    Single Leg Stand 8.7 seconds              Nutrition Therapy Plan and Nutrition Goals:  Nutrition Therapy & Goals - 01/19/23 1522       Intervention Plan   Intervention Prescribe, educate and counsel regarding individualized specific dietary modifications aiming towards targeted core components such as weight, hypertension, lipid management, diabetes, heart failure and other comorbidities.    Expected Outcomes Short Term  Goal: Understand basic principles of dietary content, such as  calories, fat, sodium, cholesterol and nutrients.;Short Term Goal: A plan has been developed with personal nutrition goals set during dietitian appointment.;Long Term Goal: Adherence to prescribed nutrition plan.             Nutrition Assessments:  MEDIFICTS Score Key: ?70 Need to make dietary changes  40-70 Heart Healthy Diet ? 40 Therapeutic Level Cholesterol Diet  Flowsheet Row Cardiac Rehab from 01/19/2023 in [facility] Cardiac and Pulmonary Rehab  Picture Your Plate Total Score on Admission 77      Picture Your Plate Scores: <16 Unhealthy dietary pattern with much room for improvement. 41-50 Dietary pattern unlikely to meet recommendations for good health and room for improvement. 51-60 More healthful dietary pattern, with some room for improvement.  >60 Healthy dietary pattern, although there may be some specific behaviors that could be improved.    Nutrition Goals Re-Evaluation:   Nutrition Goals Discharge (Final Nutrition Goals Re-Evaluation):   Psychosocial: Target Goals: Acknowledge presence or absence of significant depression and/or stress, maximize coping skills, provide positive support system. Participant is able to verbalize types and ability to use techniques and skills needed for reducing stress and depression.   Education: Stress, Anxiety, and Depression - Group verbal and visual presentation to define topics covered.  Reviews how body is impacted by stress, anxiety, and depression.  Also discusses healthy ways to reduce stress and to treat/manage anxiety and depression.  Written material given at graduation.   Education: Sleep Hygiene -Provides group verbal and written instruction about how sleep can affect your health.  Define sleep hygiene, discuss sleep cycles and impact of sleep habits. Review good sleep hygiene tips.    Initial Review & Psychosocial Screening:  Initial Psych Review & Screening - 01/04/23 1410       Initial Review   Current issues  with Current Sleep Concerns;Current Stress Concerns    Source of Stress Concerns Unable to participate in former interests or hobbies      Family Dynamics   Good Support System? Yes   wife, mother, sister, extended family     Barriers   Psychosocial barriers to participate in program There are no identifiable barriers or psychosocial needs.;The patient should benefit from training in stress management and relaxation.      Screening Interventions   Interventions Encouraged to exercise;To provide support and resources with identified psychosocial needs;Provide feedback about the scores to participant    Expected Outcomes Short Term goal: Utilizing psychosocial counselor, staff and physician to assist with identification of specific Stressors or current issues interfering with healing process. Setting desired goal for each stressor or current issue identified.;Long Term Goal: Stressors or current issues are controlled or eliminated.;Short Term goal: Identification and review with participant of any Quality of Life or Depression concerns found by scoring the questionnaire.;Long Term goal: The participant improves quality of Life and PHQ9 Scores as seen by post scores and/or verbalization of changes             Quality of Life Scores:   Quality of Life - 01/19/23 1522       Quality of Life   Select Quality of Life      Quality of Life Scores   Health/Function Pre 16.77 %    Socioeconomic Pre 20.38 %    Psych/Spiritual Pre 20.57 %    Family Pre 17.4 %    GLOBAL Post 18.44 %  Scores of 19 and below usually indicate a poorer quality of life in these areas.  A difference of  2-3 points is a clinically meaningful difference.  A difference of 2-3 points in the total score of the Quality of Life Index has been associated with significant improvement in overall quality of life, self-image, physical symptoms, and general health in studies assessing change in quality of  life.  PHQ-9: Review Flowsheet  More data exists      01/19/2023 01/10/2023 12/14/2022 12/31/2021 11/09/2021  Depression screen PHQ 2/9  Decreased Interest 0 0 0 0 1  Down, Depressed, Hopeless 0 0 0 0 0  PHQ - 2 Score 0 0 0 0 1  Altered sleeping 3 0 1 0 0  Tired, decreased energy 2 - - 0 0  Change in appetite 0 0 0 0 0  Feeling bad or failure about yourself  0 0 1 0 1  Trouble concentrating 0 0 0 0 0  Moving slowly or fidgety/restless 1 0 0 0 0  Suicidal thoughts 0 0 0 0 0  PHQ-9 Score 6 0 2 0 2  Difficult doing work/chores Somewhat difficult Not difficult at all Somewhat difficult - -   Interpretation of Total Score  Total Score Depression Severity:  1-4 = Minimal depression, 5-9 = Mild depression, 10-14 = Moderate depression, 15-19 = Moderately severe depression, 20-27 = Severe depression   Psychosocial Evaluation and Intervention:  Psychosocial Evaluation - 01/04/23 1410       Psychosocial Evaluation & Interventions   Interventions Encouraged to exercise with the program and follow exercise prescription    Comments [name] is coming to 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 He has a history of spine and neck issues and he can't take his usual medication due to his newer cardiac meds. This is stressful for him and he is tired of being in pain. He has a history of sleep concerns, but his sleep habits have gotten worse since his MI. He wakes up around 3 and can't go back to sleep until around 7am. He states his doctors can't give him a clear answer as to why. He did has his PCP about trying CBD oil for his peripheral neuropathy and sleep and she cleared him to do so. He is hopeful the CBD and getting more active in the program will help him sleep better. He can't stand for a long period of time related to his spinal issues and is currently not working. He is hopeful the program will help him understand his cardiac health and develop an exercise routine that fits his needs.    Expected Outcomes  Short: attend cardiac rehab for education and exercise. Long: develop and maintain positive self care habits.    Continue Psychosocial Services  Follow up required by staff             Psychosocial Re-Evaluation:   Psychosocial Discharge (Final Psychosocial Re-Evaluation):   Vocational Rehabilitation: Provide vocational rehab assistance to qualifying candidates.   Vocational Rehab Evaluation & Intervention:  Vocational Rehab - 01/04/23 1410       Initial Vocational Rehab Evaluation & Intervention   Assessment shows need for Vocational Rehabilitation No             Education: Education Goals: Education classes will be provided on a variety of topics geared toward better understanding of heart health and risk factor modification. Participant will state understanding/return demonstration of topics presented as noted by education test scores.  Learning Barriers/Preferences:  Learning  Barriers/Preferences - 01/04/23 1407       Learning Barriers/Preferences   Learning Barriers None    Learning Preferences None             General Cardiac Education Topics:  AED/CPR: - Group verbal and written instruction with the use of models to demonstrate the basic use of the AED with the basic ABC's of resuscitation.   Anatomy and Cardiac Procedures: - Group verbal and visual presentation and models provide information about basic cardiac anatomy and function. Reviews the testing methods done to diagnose heart disease and the outcomes of the test results. Describes the treatment choices: Medical Management, Angioplasty, or Coronary Bypass Surgery for treating various heart conditions including Myocardial Infarction, Angina, Valve Disease, and Cardiac Arrhythmias.  Written material given at graduation.   Medication Safety: - Group verbal and visual instruction to review commonly prescribed medications for heart and lung disease. Reviews the medication, class of the drug, and  side effects. Includes the steps to properly store meds and maintain the prescription regimen.  Written material given at graduation.   Intimacy: - Group verbal instruction through game format to discuss how heart and lung disease can affect sexual intimacy. Written material given at graduation..   Know Your Numbers and Heart Failure: - Group verbal and visual instruction to discuss disease risk factors for cardiac and pulmonary disease and treatment options.  Reviews associated critical values for Overweight/Obesity, Hypertension, Cholesterol, and Diabetes.  Discusses basics of heart failure: signs/symptoms and treatments.  Introduces Heart Failure Zone chart for action plan for heart failure.  Written material given at graduation.   Infection Prevention: - Provides verbal and written material to individual with discussion of infection control including proper hand washing and proper equipment cleaning during exercise session. Flowsheet Row Cardiac Rehab from 01/19/2023 in [facility] Cardiac and Pulmonary Rehab  Date 01/19/23  Educator NT  Instruction Review Code 1- Verbalizes Understanding       Falls Prevention: - Provides verbal and written material to individual with discussion of falls prevention and safety. Flowsheet Row Cardiac Rehab from 01/19/2023 in [facility] Cardiac and Pulmonary Rehab  Date 01/19/23  Educator NT  Instruction Review Code 1- Verbalizes Understanding       Other: -Provides group and verbal instruction on various topics (see comments)   Knowledge Questionnaire Score:  Knowledge Questionnaire Score - 01/19/23 1523       Knowledge Questionnaire Score   Pre Score 24/26             Core Components/Risk Factors/Patient Goals at Admission:  Personal Goals and Risk Factors at Admission - 01/19/23 1523       Core Components/Risk Factors/Patient Goals on Admission    Weight Management Yes;Weight Loss    Intervention Weight Management: Provide education and  appropriate resources to help participant work on and attain dietary goals.;Weight Management: Develop a combined nutrition and exercise program designed to reach desired caloric intake, while maintaining appropriate intake of nutrient and fiber, sodium and fats, and appropriate energy expenditure required for the weight goal.;Weight Management/Obesity: Establish reasonable short term and long term weight goals.    Admit Weight 163 lb 12.8 oz (74.3 kg)    Goal Weight: Short Term 158 lb (71.7 kg)    Goal Weight: Long Term 153 lb (69.4 kg)    Expected Outcomes Long Term: Adherence to nutrition and physical activity/exercise program aimed toward attainment of established weight goal;Short Term: Continue to assess and modify interventions until short term weight is achieved;Weight Loss:  Understanding of general recommendations for a balanced deficit meal plan, which promotes 1-2 lb weight loss per week and includes a negative energy balance of [phone] kcal/d;Understanding recommendations for meals to include 15-35% energy as protein, 25-35% energy from fat, 35-60% energy from carbohydrates, less than 200mg  of dietary cholesterol, 20-35 gm of total fiber daily;Understanding of distribution of calorie intake throughout the day with the consumption of 4-5 meals/snacks    Hypertension Yes    Intervention Provide education on lifestyle modifcations including regular physical activity/exercise, weight management, moderate sodium restriction and increased consumption of fresh fruit, vegetables, and low fat dairy, alcohol moderation, and smoking cessation.;Monitor prescription use compliance.    Expected Outcomes Short Term: Continued assessment and intervention until BP is < 140/49mm HG in hypertensive participants. < 130/84mm HG in hypertensive participants with diabetes, heart failure or chronic kidney disease.;Long Term: Maintenance of blood pressure at goal levels.    Lipids Yes    Intervention Provide education  and support for participant on nutrition & aerobic/resistive exercise along with prescribed medications to achieve LDL 70mg , HDL >40mg .    Expected Outcomes Short Term: Participant states understanding of desired cholesterol values and is compliant with medications prescribed. Participant is following exercise prescription and nutrition guidelines.;Long Term: Cholesterol controlled with medications as prescribed, with individualized exercise RX and with personalized nutrition plan. Value goals: LDL < 70mg , HDL > 40 mg.             Education:Diabetes - Individual verbal and written instruction to review signs/symptoms of diabetes, desired ranges of glucose level fasting, after meals and with exercise. Acknowledge that pre and post exercise glucose checks will be done for 3 sessions at entry of program.   Core Components/Risk Factors/Patient Goals Review:    Core Components/Risk Factors/Patient Goals at Discharge (Final Review):    ITP Comments:  ITP Comments     Row Name 01/04/23 1423 01/19/23 1520         ITP Comments Initial phone call completed. Diagnosis can be found in [facility] 3/15. EP Orientation scheduled for Wednesday 4/24 at 1:30pm. Completed and gym orientation. Initial ITP created and sent for review to Dr. Bethann Punches, Medical Director.               Comments: Initial ITP

## 2023-01-19 NOTE — Assessment & Plan Note (Signed)
Hard to really tell what he is noting as far as the fatigue and exertional dyspnea near the end of the day.  Just says he feels off.  I think maybe the metoprolol could be playing a role.  Will switch metoprolol to nighttime and also switched amlodipine to morning. Take statin at night as well. Stressed importance of actually getting sleep since it seems like he is not getting enough sleep either.

## 2023-01-19 NOTE — Patient Instructions (Signed)
Patient Instructions  Patient Details  Name: [name] MRN: [id number] Date of Birth: 1961-09-17 Referring Provider:  Nelwyn Salisbury, MD  Below are your personal goals for exercise, nutrition, and risk factors. Our goal is to help you stay on track towards obtaining and maintaining these goals. We will be discussing your progress on these goals with you throughout the program.  Initial Exercise Prescription:  Initial Exercise Prescription - 01/19/23 1500       Date of Initial Exercise RX and Referring Provider   Date 01/19/23    Referring Provider Dr. Bryan Lemma, MD      Oxygen   Maintain Oxygen Saturation 88% or higher      Treadmill   MPH 2.8    Grade 1    Minutes 15    METs 3.53      Recumbant Bike   Level 3    RPM 50    Watts 59    Minutes 15    METs 4.52      NuStep   Level 4    SPM 80    Minutes 15    METs 4.52      REL-XR   Level 2    Speed 50    Minutes 15    METs 4.52      Prescription Details   Frequency (times per week) 2    Duration Progress to 30 minutes of continuous aerobic without signs/symptoms of physical distress      Intensity   THRR 40-80% of Max Heartrate 110-143    Ratings of Perceived Exertion 11-13    Perceived Dyspnea 0-4      Progression   Progression Continue to progress workloads to maintain intensity without signs/symptoms of physical distress.      Resistance Training   Training Prescription Yes    Weight 3 lb, 5 lb    Reps 10-15             Exercise Goals: Frequency: Be able to perform aerobic exercise two to three times per week in program working toward 2-5 days per week of home exercise.  Intensity: Work with a perceived exertion of 11 (fairly light) - 15 (hard) while following your exercise prescription.  We will make changes to your prescription with you as you progress through the program.   Duration: Be able to do 30 to 45 minutes of continuous aerobic exercise in addition to a 5 minute  warm-up and a 5 minute cool-down routine.   Nutrition Goals: Your personal nutrition goals will be established when you do your nutrition analysis with the dietician.  The following are general nutrition guidelines to follow: Cholesterol < /day Sodium < /day Fiber: Men over 50 yrs - 30 grams per day  Personal Goals:  Personal Goals and Risk Factors at Admission - 01/19/23 1523       Core Components/Risk Factors/Patient Goals on Admission    Weight Management Yes;Weight Loss    Intervention Weight Management: Provide education and appropriate resources to help participant work on and attain dietary goals.;Weight Management: Develop a combined nutrition and exercise program designed to reach desired caloric intake, while maintaining appropriate intake of nutrient and fiber, sodium and fats, and appropriate energy expenditure required for the weight goal.;Weight Management/Obesity: Establish reasonable short term and long term weight goals.    Admit Weight 163 lb 12.8 oz (74.3 kg)    Goal Weight: Short Term 158 lb (71.7 kg)    Goal Weight: Long Term 153  lb (69.4 kg)    Expected Outcomes Long Term: Adherence to nutrition and physical activity/exercise program aimed toward attainment of established weight goal;Short Term: Continue to assess and modify interventions until short term weight is achieved;Weight Loss: Understanding of general recommendations for a balanced deficit meal plan, which promotes 1-2 lb weight loss per week and includes a negative energy balance of [phone] kcal/d;Understanding recommendations for meals to include 15-35% energy as protein, 25-35% energy from fat, 35-60% energy from carbohydrates, less than  of dietary cholesterol, 20-35 gm of total fiber daily;Understanding of distribution of calorie intake throughout the day with the consumption of 4-5 meals/snacks    Hypertension Yes    Intervention Provide education on lifestyle modifcations including regular  physical activity/exercise, weight management, moderate sodium restriction and increased consumption of fresh fruit, vegetables, and low fat dairy, alcohol moderation, and smoking cessation.;Monitor prescription use compliance.    Expected Outcomes Short Term: Continued assessment and intervention until BP is < 140/37mm HG in hypertensive participants. < 130/110mm HG in hypertensive participants with diabetes, heart failure or chronic kidney disease.;Long Term: Maintenance of blood pressure at goal levels.    Lipids Yes    Intervention Provide education and support for participant on nutrition & aerobic/resistive exercise along with prescribed medications to achieve LDL 70mg , HDL >40mg .    Expected Outcomes Short Term: Participant states understanding of desired cholesterol values and is compliant with medications prescribed. Participant is following exercise prescription and nutrition guidelines.;Long Term: Cholesterol controlled with medications as prescribed, with individualized exercise RX and with personalized nutrition plan. Value goals: LDL < , HDL > 40 mg.            Exercise Goals and Review:  Exercise Goals     Row Name 01/19/23 1540             Exercise Goals   Increase Physical Activity Yes       Intervention Provide advice, education, support and counseling about physical activity/exercise needs.;Develop an individualized exercise prescription for aerobic and resistive training based on initial evaluation findings, risk stratification, comorbidities and participant's personal goals.       Expected Outcomes Short Term: Attend rehab on a regular basis to increase amount of physical activity.;Long Term: Add in home exercise to make exercise part of routine and to increase amount of physical activity.;Long Term: Exercising regularly at least 3-5 days a week.       Increase Strength and Stamina Yes       Intervention Provide advice, education, support and counseling about  physical activity/exercise needs.;Develop an individualized exercise prescription for aerobic and resistive training based on initial evaluation findings, risk stratification, comorbidities and participant's personal goals.       Expected Outcomes Short Term: Increase workloads from initial exercise prescription for resistance, speed, and METs.;Short Term: Perform resistance training exercises routinely during rehab and add in resistance training at home;Long Term: Improve cardiorespiratory fitness, muscular endurance and strength as measured by increased METs and functional capacity ( )       Able to understand and use rate of perceived exertion (RPE) scale Yes       Intervention Provide education and explanation on how to use RPE scale       Expected Outcomes Short Term: Able to use RPE daily in rehab to express subjective intensity level;Long Term:  Able to use RPE to guide intensity level when exercising independently       Able to understand and use Dyspnea scale Yes  Intervention Provide education and explanation on how to use Dyspnea scale       Expected Outcomes Short Term: Able to use Dyspnea scale daily in rehab to express subjective sense of shortness of breath during exertion;Long Term: Able to use Dyspnea scale to guide intensity level when exercising independently       Knowledge and understanding of Target Heart Rate Range (THRR) Yes       Intervention Provide education and explanation of THRR including how the numbers were predicted and where they are located for reference       Expected Outcomes Long Term: Able to use THRR to govern intensity when exercising independently;Short Term: Able to state/look up THRR;Short Term: Able to use daily as guideline for intensity in rehab       Able to check pulse independently Yes       Intervention Provide education and demonstration on how to check pulse in carotid and radial arteries.;Review the importance of being able to check your own  pulse for safety during independent exercise       Expected Outcomes Short Term: Able to explain why pulse checking is important during independent exercise;Long Term: Able to check pulse independently and accurately       Understanding of Exercise Prescription Yes       Intervention Provide education, explanation, and written materials on patient's individual exercise prescription       Expected Outcomes Short Term: Able to explain program exercise prescription;Long Term: Able to explain home exercise prescription to exercise independently

## 2023-01-19 NOTE — Assessment & Plan Note (Signed)
Troponin elevation but not significant damage based on normal EF with no RWMA on echo.  Subtotaled LCx blood clot relatively soon.  He is having lots of unusual symptoms.  Severe fatigue and chest discomfort.  Monitor closely as I think some of this may just be getting used to medications.

## 2023-01-19 NOTE — Assessment & Plan Note (Signed)
I think anxiety is driving a lot of his symptoms.  He is having issues with insomnia and I suggested using melatonin.  I think he is perseverating on a lot of symptoms and just needs to relax and calm down and hopefully the symptoms will abate.

## 2023-01-19 NOTE — Assessment & Plan Note (Signed)
Pretty high LDL normal at the time of his MI.  Was started on high-dose atorvastatin.  We should recheck lipids in about 2 more months to reassess benefit of atorvastatin, low threshold to consider referral for PCSK9 inhibitor/inclisiran.

## 2023-01-19 NOTE — Assessment & Plan Note (Addendum)
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  This was just a month ago.  He is having all kinds of weird symptoms as well as dyspnea and fatigue.  Off-and-on chest pain.  Most of this is related to stress and anxiety having had a heart attack.  He is try to make major adjustments to his lifestyle.  He is noting several different interactions that he thinks are related to medications.  I spent a lot of time reassuring him t and trying to dissuade him from some of these concerns.  Very unlikely that he would be having anginal pain from the circumflex unless it is a jailed vessel.  Plan: Noting fatigue and lack of exercise tolerance, will switch Toprol to bedtime. Change amlodipine to morning. Continue atorvastatin 80 mg-switch to p.m. If he continues to have symptoms, can consider Imdur. Continue aspirin and Brilinta as DAPT.  Per significant bleeding or bruising after a month of taking he can hold aspirin for several days. Plan will be to continue uninterrupted DAPT for-preferably 12 months, however for urgent procedures would be acceptable if we can make it at least to 6 months. If there is significant bleeding or bruising concerns we can stop aspirin in 6 months. As for the Brilinta, would recommend taking it with caffeine.  Low threshold to consider switching to Effient. Plan will continue the Thienopyridine through March 2025 uninterrupted. As of April 2025, we will reduce to maintenance dose Thienopyridine (either Brilinta 60 mg twice daily or Plavix 75 mg daily) to complete a second year. ->  Maintenance dose can be held 5 to 7 days preop for surgeries or procedures.Marland Kitchen

## 2023-01-20 DIAGNOSIS — M5412 Radiculopathy, cervical region: Secondary | ICD-10-CM | POA: Diagnosis not present

## 2023-01-20 DIAGNOSIS — M25512 Pain in left shoulder: Secondary | ICD-10-CM | POA: Diagnosis not present

## 2023-01-20 DIAGNOSIS — G894 Chronic pain syndrome: Secondary | ICD-10-CM | POA: Diagnosis not present

## 2023-01-20 DIAGNOSIS — G5752 Tarsal tunnel syndrome, left lower limb: Secondary | ICD-10-CM | POA: Diagnosis not present

## 2023-01-20 DIAGNOSIS — Z79891 Long term (current) use of opiate analgesic: Secondary | ICD-10-CM | POA: Diagnosis not present

## 2023-01-24 ENCOUNTER — Encounter: Payer: PPO | Admitting: *Deleted

## 2023-01-24 DIAGNOSIS — I214 Non-ST elevation (NSTEMI) myocardial infarction: Secondary | ICD-10-CM

## 2023-01-24 DIAGNOSIS — Z955 Presence of coronary angioplasty implant and graft: Secondary | ICD-10-CM

## 2023-01-24 DIAGNOSIS — Z5189 Encounter for other specified aftercare: Secondary | ICD-10-CM | POA: Diagnosis not present

## 2023-01-24 NOTE — Progress Notes (Signed)
Daily Session Note  Patient Details  Name: [name] MRN: [id number] Date of Birth: May 06, 1961 Referring Provider:   Flowsheet Row Cardiac Rehab from 01/19/2023 in [facility] Cardiac and Pulmonary Rehab  Referring Provider Dr. Bryan Lemma, MD       Encounter Date: 01/24/2023  Check In:  Session Check In - 01/24/23 1344       Check-In   Supervising physician immediately available to respond to emergencies See telemetry face sheet for immediately available ER MD    Location [facility]-Cardiac & Pulmonary Rehab    Staff Present Susann Givens, RN BSN;Megan Katrinka Blazing, RN, Bobetta Lime, RCP,RRT,BSRT    Virtual Visit No    Medication changes reported     No    Fall or balance concerns reported    No    Warm-up and Cool-down Performed on first and last piece of equipment    Resistance Training Performed Yes    VAD Patient? No    PAD/SET Patient? No      Pain Assessment   Currently in Pain? No/denies                Social History   Tobacco Use  Smoking Status Former  Smokeless Tobacco Never  Tobacco Comments   quit 35 years    Goals Met:  Independence with exercise equipment Exercise tolerated well No report of concerns or symptoms today Strength training completed today  Goals Unmet:  Not Applicable  Comments: First full day of exercise!  Patient was oriented to gym and equipment including functions, settings, policies, and procedures.  [name] individual exercise prescription and treatment plan were reviewed.  All starting workloads were established based on the results of the 6 minute walk test done at initial orientation visit.  The plan for exercise progression was also introduced and progression will be customized based on patient's performance and goals.    Dr. Bethann Punches is Medical Director for [facility] Cardiac Rehabilitation.  Dr. Vida Rigger is Medical Director for [facility] Pulmonary Rehabilitation.

## 2023-01-25 NOTE — Addendum Note (Signed)
Addended by: Tobin Chad on: 01/25/2023 05:02 PM   Modules accepted: Orders

## 2023-01-26 ENCOUNTER — Encounter: Payer: PPO | Attending: Cardiology | Admitting: *Deleted

## 2023-01-26 DIAGNOSIS — Z955 Presence of coronary angioplasty implant and graft: Secondary | ICD-10-CM | POA: Diagnosis not present

## 2023-01-26 DIAGNOSIS — I214 Non-ST elevation (NSTEMI) myocardial infarction: Secondary | ICD-10-CM | POA: Diagnosis not present

## 2023-01-26 NOTE — Progress Notes (Signed)
Daily Session Note  Patient Details  Name: [name] MRN: [id number] Date of Birth: 11/07/1960 Referring Provider:   Flowsheet Row Cardiac Rehab from 01/19/2023 in [facility] Cardiac and Pulmonary Rehab  Referring Provider Dr. Bryan Lemma, MD       Encounter Date: 01/26/2023  Check In:  Session Check In - 01/26/23 1344       Check-In   Supervising physician immediately available to respond to emergencies See telemetry face sheet for immediately available ER MD    Location [facility]-Cardiac & Pulmonary Rehab    Staff Present Susann Givens, RN BSN;Melissa Caiola MS, RDN, Fonda Kinder, BS, ACSM CEP, Exercise Physiologist;Laureen Manson Passey, BS, RRT, CPFT    Virtual Visit No    Medication changes reported     No    Fall or balance concerns reported    No    Warm-up and Cool-down Performed on first and last piece of equipment    Resistance Training Performed Yes    VAD Patient? No    PAD/SET Patient? No      Pain Assessment   Currently in Pain? No/denies                Social History   Tobacco Use  Smoking Status Former  Smokeless Tobacco Never  Tobacco Comments   quit 35 years    Goals Met:  Independence with exercise equipment Exercise tolerated well No report of concerns or symptoms today Strength training completed today  Goals Unmet:  Not Applicable  Comments: Pt able to follow exercise prescription today without complaint.  Will continue to monitor for progression.    Dr. Bethann Punches is Medical Director for [facility] Cardiac Rehabilitation.  Dr. Vida Rigger is Medical Director for [facility] Pulmonary Rehabilitation.

## 2023-01-31 ENCOUNTER — Encounter: Payer: PPO | Admitting: *Deleted

## 2023-01-31 DIAGNOSIS — Z955 Presence of coronary angioplasty implant and graft: Secondary | ICD-10-CM

## 2023-01-31 DIAGNOSIS — I214 Non-ST elevation (NSTEMI) myocardial infarction: Secondary | ICD-10-CM | POA: Diagnosis not present

## 2023-01-31 NOTE — Progress Notes (Signed)
Daily Session Note  Patient Details  Name: [name] MRN: [id number] Date of Birth: [birth year] Referring Provider:   Flowsheet Row Cardiac Rehab from 01/19/2023 in [facility] Cardiac and Pulmonary Rehab  Referring Provider Dr. Bryan Lemma, MD       Encounter Date: 01/31/2023  Check In:  Session Check In - 01/31/23 1341       Check-In   Supervising physician immediately available to respond to emergencies See telemetry face sheet for immediately available ER MD    Location [facility]-Cardiac & Pulmonary Rehab    Staff Present Susann Givens, RN BSN;Joseph Reino Kent, Guinevere Ferrari, RN, [place]    Virtual Visit No    Medication changes reported     Yes    Comments changed baclofen to methocarbamol    Warm-up and Cool-down Performed on first and last piece of equipment    Resistance Training Performed Yes    VAD Patient? No    PAD/SET Patient? No      Pain Assessment   Currently in Pain? No/denies                Social History   Tobacco Use  Smoking Status Former  Smokeless Tobacco Never  Tobacco Comments   quit 35 years    Goals Met:  Independence with exercise equipment Exercise tolerated well No report of concerns or symptoms today Strength training completed today  Goals Unmet:  Not Applicable  Comments: Pt able to follow exercise prescription today without complaint.  Will continue to monitor for progression.  Reviewed home exercise with pt today.  Pt plans to walk and use elliptical at home for exercise.  Reviewed THR, pulse, RPE, sign and symptoms, pulse oximetery and when to call 911 or MD.  Also discussed weather considerations and indoor options.  Pt voiced understanding.   Dr. Bethann Punches is Medical Director for [facility] Cardiac Rehabilitation.  Dr. Vida Rigger is Medical Director for [facility] Pulmonary Rehabilitation.

## 2023-02-02 ENCOUNTER — Encounter: Payer: PPO | Admitting: *Deleted

## 2023-02-02 DIAGNOSIS — Z955 Presence of coronary angioplasty implant and graft: Secondary | ICD-10-CM

## 2023-02-02 DIAGNOSIS — I214 Non-ST elevation (NSTEMI) myocardial infarction: Secondary | ICD-10-CM | POA: Diagnosis not present

## 2023-02-02 NOTE — Progress Notes (Signed)
Daily Session Note  Patient Details  Name: [name] MRN: [id number] Date of Birth: Jun 22, 1961 Referring Provider:   Flowsheet Row Cardiac Rehab from 01/19/2023 in [facility] Cardiac and Pulmonary Rehab  Referring Provider Dr. Bryan Lemma, MD       Encounter Date: 02/02/2023  Check In:  Session Check In - 02/02/23 1338       Check-In   Supervising physician immediately available to respond to emergencies See telemetry face sheet for immediately available ER MD    Location [facility]-Cardiac & Pulmonary Rehab    Staff Present Susann Givens, RN Mabeline Caras, BS, ACSM CEP, Exercise Physiologist;Jessica Juanetta Gosling, MA, RCEP, CCRP, CCET    Virtual Visit No    Medication changes reported     No    Warm-up and Cool-down Performed on first and last piece of equipment    Resistance Training Performed Yes    VAD Patient? No    PAD/SET Patient? No      Pain Assessment   Currently in Pain? No/denies                Social History   Tobacco Use  Smoking Status Former  Smokeless Tobacco Never  Tobacco Comments   quit 35 years    Goals Met:  Independence with exercise equipment Exercise tolerated well No report of concerns or symptoms today Strength training completed today  Goals Unmet:  Not Applicable  Comments: Pt able to follow exercise prescription today without complaint.  Will continue to monitor for progression.    Dr. Bethann Punches is Medical Director for [facility] Cardiac Rehabilitation.  Dr. Vida Rigger is Medical Director for [facility] Pulmonary Rehabilitation.

## 2023-02-09 ENCOUNTER — Encounter: Payer: Self-pay | Admitting: *Deleted

## 2023-02-09 DIAGNOSIS — Z955 Presence of coronary angioplasty implant and graft: Secondary | ICD-10-CM

## 2023-02-09 DIAGNOSIS — I214 Non-ST elevation (NSTEMI) myocardial infarction: Secondary | ICD-10-CM

## 2023-02-09 NOTE — Progress Notes (Signed)
Cardiac Individual Treatment Plan  Patient Details  Name: KAIMANI BEESLEY MRN: [id number] Date of Birth: December 15, 1960 Referring Provider:   Flowsheet Row Cardiac Rehab from 01/19/2023 in [facility] Cardiac and Pulmonary Rehab  Referring Provider Dr. Bryan Lemma, MD       Initial Encounter Date:  Flowsheet Row Cardiac Rehab from 01/19/2023 in [facility] Cardiac and Pulmonary Rehab  Date 01/19/23       Visit Diagnosis: NSTEMI (non-ST elevated myocardial infarction) [facility])  Status post coronary artery stent placement  [name] Home Medications on Admission:  Current Outpatient Medications:    acyclovir (ZOVIRAX) 800 MG tablet, Take 1 tablet (800 mg total) by mouth 2 (two) times daily. (Patient not taking: Reported on 01/17/2023), Disp: 180 tablet, Rfl: 3   amLODipine (NORVASC) 10 MG tablet, TAKE 1 TABLET BY MOUTH DAILY (Patient taking differently: Take 10 mg by mouth every evening.), Disp: 90 tablet, Rfl: 3   aspirin EC 81 MG tablet, Take 1 tablet (81 mg total) by mouth daily. Swallow whole., Disp: 30 tablet, Rfl: 12   atorvastatin (LIPITOR) 80 MG tablet, Take 1 tablet (80 mg total) by mouth daily., Disp: 90 tablet, Rfl: 2   baclofen (LIORESAL) 20 MG tablet, Take 1 tablet (20 mg total) by mouth every 8 (eight) hours as needed for muscle spasms., Disp: 60 each, Rfl: 5   cetirizine (ZYRTEC) 10 MG tablet, Take 10 mg by mouth daily. (Patient not taking: Reported on 01/17/2023), Disp: , Rfl:    diazepam (VALIUM) 10 MG tablet, Take 10 mg by mouth 2 (two) times daily., Disp: , Rfl:    diclofenac sodium (VOLTAREN) 1 % GEL, Apply 1 application  topically daily as needed (For pain). (Patient not taking: Reported on 01/17/2023), Disp: , Rfl:    EPINEPHrine 0.3 mg/0.3 mL IJ SOAJ injection, USE AS DIRECTED (Patient not taking: Reported on 01/17/2023), Disp: 2 each, Rfl: 1   fentaNYL (DURAGESIC - DOSED MCG/HR) 50 MCG/HR, Place 50 mcg onto the skin every other day., Disp: , Rfl:    fluticasone (FLONASE) 50  MCG/ACT nasal spray, USE TWO SPRAYS IN EACH NOSTRIL DAILY (Patient not taking: Reported on 01/17/2023), Disp: 16 g, Rfl: 11   lactulose, encephalopathy, (CHRONULAC) 10 GM/15ML SOLN, Take 20 g by mouth at bedtime., Disp: , Rfl:    metoprolol succinate (TOPROL-XL) 25 MG 24 hr tablet, Take 1 tablet (25 mg total) by mouth at bedtime., Disp: 90 tablet, Rfl: 2   nitroGLYCERIN (NITROSTAT) 0.4 MG SL tablet, Place 1 tablet (0.4 mg total) under the tongue every 5 (five) minutes as needed for chest pain., Disp: 100 tablet, Rfl: 3   oxyCODONE (OXY IR/ROXICODONE) 5 MG immediate release tablet, Take 5 mg by mouth every 6 (six) hours as needed for moderate pain. (Patient not taking: Reported on 01/17/2023), Disp: , Rfl: 0   promethazine (PHENERGAN) 25 MG tablet, Take 12.5 mg by mouth every 6 (six) hours as needed for nausea or vomiting., Disp: , Rfl:    ticagrelor (BRILINTA) 90 MG TABS tablet, Take 1 tablet (90 mg total) by mouth 2 (two) times daily., Disp: 60 tablet, Rfl: 11   traZODone (DESYREL) 100 MG tablet, Take 50 mg by mouth at bedtime., Disp: , Rfl:    triamcinolone cream (KENALOG) 0.1 %, Apply 1 application topically 2 (two) times daily. (Patient not taking: Reported on 01/17/2023), Disp: 30 g, Rfl: 0  Past Medical History: Past Medical History:  Diagnosis Date   ADHD (attention deficit hyperactivity disorder), inattentive type    sees Dr.  Deboraha Sprang in [place], [place]    Anxiety    sees Dr. Deboraha Sprang in [place], [place]    Arthritis    Benign enlargement of prostate    sees [facility]    CAD- 1 Vessel, DES PCI LCx. (NSTEMI) 12/10/2022   LHC 12/10/2022:  Minimal LM & LAD disease. Mid Cx 99% (subtotal occlusion) =>  PCI using a 2.25 x 24 mm Synergy DES; pRCA 30% & mRCA 50%.      Carpal tunnel syndrome on left    sees Dr. Kathie Dike at [facility]    Chronic pain    sees Dr. Thyra Breed (meds) and Altamease Oiler (accupuncture)    Depression    sees Dr. Deboraha Sprang   Functional impotence     Hyperlipidemia with target LDL less than 70 07/09/2014   Hypogonadism in male    sees Carollee Herter (a [place]) at [facility]    Insomnia    Kidney stones    NSTEMI (non-ST elevated myocardial infarction) (HCC) 12/10/2022   Admitted with stuttering chest pain, troponin elevation. => 9 9% LCx=> DES stent.  Normal EF on echo.   Sinusitis, acute 07/17/2016   Urticaria due to cold     Tobacco Use: Social History   Tobacco Use  Smoking Status Former  Smokeless Tobacco Never  Tobacco Comments   quit 35 years    Labs: Review Flowsheet  More data exists      Latest Ref Rng & Units 04/03/2019 06/05/2020 11/09/2021 12/10/2022 12/11/2022  Labs for ITP Cardiac and Pulmonary Rehab  Cholestrol 0 - 200 mg/dL 161  096  045  409  -  LDL (calc) 0 - 99 mg/dL 811  914  782  956  -  HDL-C >40 mg/dL 21.30  58  86.57  61  -  Trlycerides <150 mg/dL 846.9  629  528.4  71  -  Hemoglobin A1c 4.8 - 5.6 % - 5.8  6.0  - 5.9      Exercise Target Goals: Exercise Program Goal: Individual exercise prescription set using results from initial 6 min walk test and THRR while considering  patient's activity barriers and safety.   Exercise Prescription Goal: Initial exercise prescription builds to 30-45 minutes a day of aerobic activity, 2-3 days per week.  Home exercise guidelines will be given to patient during program as part of exercise prescription that the participant will acknowledge.   Education: Aerobic Exercise: - Group verbal and visual presentation on the components of exercise prescription. Introduces F.I.T.T principle from ACSM for exercise prescriptions.  Reviews F.I.T.T. principles of aerobic exercise including progression. Written material given at graduation. Flowsheet Row Cardiac Rehab from 02/02/2023 in [facility] Cardiac and Pulmonary Rehab  Education need identified 01/19/23       Education: Resistance Exercise: - Group verbal and visual presentation on the components of exercise prescription.  Introduces F.I.T.T principle from ACSM for exercise prescriptions  Reviews F.I.T.T. principles of resistance exercise including progression. Written material given at graduation.    Education: Exercise & Equipment Safety: - Individual verbal instruction and demonstration of equipment use and safety with use of the equipment. Flowsheet Row Cardiac Rehab from 02/02/2023 in [facility] Cardiac and Pulmonary Rehab  Date 01/19/23  Educator NT  Instruction Review Code 1- Verbalizes Understanding       Education: Exercise Physiology & General Exercise Guidelines: - Group verbal and written instruction with models to review the exercise physiology of the cardiovascular system and associated critical values. Provides general exercise guidelines with  specific guidelines to those with heart or lung disease.    Education: Flexibility, Balance, Mind/Body Relaxation: - Group verbal and visual presentation with interactive activity on the components of exercise prescription. Introduces F.I.T.T principle from ACSM for exercise prescriptions. Reviews F.I.T.T. principles of flexibility and balance exercise training including progression. Also discusses the mind body connection.  Reviews various relaxation techniques to help reduce and manage stress (i.e. Deep breathing, progressive muscle relaxation, and visualization). Balance handout provided to take home. Written material given at graduation.   Activity Barriers & Risk Stratification:  Activity Barriers & Cardiac Risk Stratification - 01/19/23 1536       Activity Barriers & Cardiac Risk Stratification   Activity Barriers Back Problems;Shortness of Breath;Neck/Spine Problems;Other (comment)    Comments Torn rotator cuff last year, neuropathy causes pain in feet    Cardiac Risk Stratification Moderate             6 Minute Walk:  6 Minute Walk     Row Name 01/19/23 1525         6 Minute Walk   Phase Initial     Distance 1750 feet     Walk Time 6  minutes     # of Rest Breaks 0     MPH 3.31     METS 4.52     RPE 11     Perceived Dyspnea  1     VO2 Peak 15.83     Symptoms Yes (comment)     Comments Neck Pain 3/10, Feet Pain 4/10     Resting HR 78 bpm     Resting BP 120/78     Resting Oxygen Saturation  97 %     Exercise Oxygen Saturation  during 6 min walk 95 %     Max Ex. HR 107 bpm     Max Ex. BP 134/74     2 Minute Post BP 112/68              Oxygen Initial Assessment:   Oxygen Re-Evaluation:   Oxygen Discharge (Final Oxygen Re-Evaluation):   Initial Exercise Prescription:  Initial Exercise Prescription - 01/19/23 1500       Date of Initial Exercise RX and Referring Provider   Date 01/19/23    Referring Provider Dr. Bryan Lemma, MD      Oxygen   Maintain Oxygen Saturation 88% or higher      Treadmill   MPH 2.8    Grade 1    Minutes 15    METs 3.53      Recumbant Bike   Level 3    RPM 50    Watts 59    Minutes 15    METs 4.52      NuStep   Level 4    SPM 80    Minutes 15    METs 4.52      REL-XR   Level 2    Speed 50    Minutes 15    METs 4.52      Prescription Details   Frequency (times per week) 2    Duration Progress to 30 minutes of continuous aerobic without signs/symptoms of physical distress      Intensity   THRR 40-80% of Max Heartrate 110-143    Ratings of Perceived Exertion 11-13    Perceived Dyspnea 0-4      Progression   Progression Continue to progress workloads to maintain intensity without signs/symptoms of physical distress.  Resistance Training   Training Prescription Yes    Weight 3 lb, 5 lb    Reps 10-15             Perform Capillary Blood Glucose checks as needed.  Exercise Prescription Changes:   Exercise Prescription Changes     Row Name 01/19/23 1500 01/26/23 1600 01/31/23 1400 02/07/23 1400       Response to Exercise   Blood Pressure (Admit) 120/78 122/64 -- 124/72    Blood Pressure (Exercise) 134/74 138/62 -- 140/68    Blood  Pressure (Exit) 112/68 102/64 -- 120/78    Heart Rate (Admit) 78 bpm 75 bpm -- 70 bpm    Heart Rate (Exercise) 107 bpm 97 bpm -- 123 bpm    Heart Rate (Exit) 81 bpm 74 bpm -- 79 bpm    Oxygen Saturation (Admit) 97 % -- -- --    Oxygen Saturation (Exercise) 95 % -- -- --    Oxygen Saturation (Exit) 97 % -- -- --    Rating of Perceived Exertion (Exercise) 11 15 -- 15    Perceived Dyspnea (Exercise) 1 -- -- --    Symptoms Neck pain 3/10, Feet Pain 4/10 none -- none    Comments Results First full day of exercise -- --    Duration -- Continue with 30 min of aerobic exercise without signs/symptoms of physical distress. -- Continue with 30 min of aerobic exercise without signs/symptoms of physical distress.    Intensity -- THRR unchanged -- THRR unchanged      Progression   Progression -- Continue to progress workloads to maintain intensity without signs/symptoms of physical distress. -- Continue to progress workloads to maintain intensity without signs/symptoms of physical distress.    Average METs -- 2.99 -- 3.26      Resistance Training   Training Prescription -- Yes -- Yes    Weight -- 3 lb, 5 lb -- 3 lb, 5 lb    Reps -- 10-15 -- 10-15      Interval Training   Interval Training -- No -- No      Treadmill   MPH -- 2.2 -- 2.2    Grade -- 1 -- 1    Minutes -- 15 -- 15    METs -- 2.99 -- 2.99      Recumbant Bike   Level -- -- -- 4    Watts -- -- -- 30    Minutes -- -- -- 15      REL-XR   Level -- 2 -- 3    Minutes -- 15 -- 15    METs -- -- -- 3.6      Track   Laps -- -- -- 52    Minutes -- -- -- 15    METs -- -- -- 3.83      Home Exercise Plan   Plans to continue exercise at -- -- Home (comment)  walking, elliptical Home (comment)  walking, elliptical    Frequency -- -- Add 3 additional days to program exercise sessions. Add 3 additional days to program exercise sessions.    Initial Home Exercises Provided -- -- 01/31/23 01/31/23      Oxygen   Maintain Oxygen  Saturation -- 88% or higher -- 88% or higher             Exercise Comments:   Exercise Comments     Row Name 01/24/23 1345           Exercise Comments  First full day of exercise!  Patient was oriented to gym and equipment including functions, settings, policies, and procedures.  [name] individual exercise prescription and treatment plan were reviewed.  All starting workloads were established based on the results of the 6 minute walk test done at initial orientation visit.  The plan for exercise progression was also introduced and progression will be customized based on patient's performance and goals.                Exercise Goals and Review:   Exercise Goals     Row Name 01/19/23 1540             Exercise Goals   Increase Physical Activity Yes       Intervention Provide advice, education, support and counseling about physical activity/exercise needs.;Develop an individualized exercise prescription for aerobic and resistive training based on initial evaluation findings, risk stratification, comorbidities and participant's personal goals.       Expected Outcomes Short Term: Attend rehab on a regular basis to increase amount of physical activity.;Long Term: Add in home exercise to make exercise part of routine and to increase amount of physical activity.;Long Term: Exercising regularly at least 3-5 days a week.       Increase Strength and Stamina Yes       Intervention Provide advice, education, support and counseling about physical activity/exercise needs.;Develop an individualized exercise prescription for aerobic and resistive training based on initial evaluation findings, risk stratification, comorbidities and participant's personal goals.       Expected Outcomes Short Term: Increase workloads from initial exercise prescription for resistance, speed, and METs.;Short Term: Perform resistance training exercises routinely during rehab and add in resistance training at  home;Long Term: Improve cardiorespiratory fitness, muscular endurance and strength as measured by increased METs and functional capacity ( )       Able to understand and use rate of perceived exertion (RPE) scale Yes       Intervention Provide education and explanation on how to use RPE scale       Expected Outcomes Short Term: Able to use RPE daily in rehab to express subjective intensity level;Long Term:  Able to use RPE to guide intensity level when exercising independently       Able to understand and use Dyspnea scale Yes       Intervention Provide education and explanation on how to use Dyspnea scale       Expected Outcomes Short Term: Able to use Dyspnea scale daily in rehab to express subjective sense of shortness of breath during exertion;Long Term: Able to use Dyspnea scale to guide intensity level when exercising independently       Knowledge and understanding of Target Heart Rate Range (THRR) Yes       Intervention Provide education and explanation of THRR including how the numbers were predicted and where they are located for reference       Expected Outcomes Long Term: Able to use THRR to govern intensity when exercising independently;Short Term: Able to state/look up THRR;Short Term: Able to use daily as guideline for intensity in rehab       Able to check pulse independently Yes       Intervention Provide education and demonstration on how to check pulse in carotid and radial arteries.;Review the importance of being able to check your own pulse for safety during independent exercise       Expected Outcomes Short Term: Able to explain why pulse checking is important during independent  exercise;Long Term: Able to check pulse independently and accurately       Understanding of Exercise Prescription Yes       Intervention Provide education, explanation, and written materials on patient's individual exercise prescription       Expected Outcomes Short Term: Able to explain program  exercise prescription;Long Term: Able to explain home exercise prescription to exercise independently                Exercise Goals Re-Evaluation :  Exercise Goals Re-Evaluation     Row Name 01/24/23 1345 01/26/23 1625 01/31/23 1441 02/07/23 1433       Exercise Goal Re-Evaluation   Exercise Goals Review Increase Physical Activity;Able to understand and use rate of perceived exertion (RPE) scale;Knowledge and understanding of Target Heart Rate Range (THRR);Understanding of Exercise Prescription;Increase Strength and Stamina;Able to check pulse independently Increase Physical Activity;Increase Strength and Stamina;Understanding of Exercise Prescription Increase Physical Activity;Increase Strength and Stamina;Understanding of Exercise Prescription Increase Physical Activity;Increase Strength and Stamina;Understanding of Exercise Prescription    Comments Reviewed RPE scale, THR and program prescription with pt today.  Pt voiced understanding and was given a copy of goals to take home. [name] is off to a good start in the program. During his first day of exercise he was able to walk on the treadmill at a speed of 2.2 mph with a 1% incline. He also did well on the XR at level 2. He tolerated using a 3 lb and a 5 lb hand weight for resistance training as well. We will continue to monitor his progress in the program [name] is already exercising at home on his off days. Reviewed home exercise with pt today.  Pt plans to walk and use elliptical at home for exercise.  Reviewed THR, pulse, RPE, sign and symptoms, pulse oximetery and when to call 911 or MD.  Also discussed weather considerations and indoor options.  Pt voiced understanding. [name] is doing well in the program. He is now walking up to 52 laps on the track and increased his overall average MET level to 3.26 METs. He also improved to level 4 on the recumbent bike and level 3 on the XR. He has continued to use 3 lb hand weights for resistance training  as well. We will continue to monitor his progress in the program.    Expected Outcomes Short: Use RPE daily to regulate intensity.  Long: Follow program prescription in THR. Short: Continue to follow current exercise prescription. Long: Continue to increase MET level and stamina. Short: Continue to exercise at home on off days Long: Continue to follow program prescription Short: Try 4 lb hand weights for resistance training, Long: Conitnue to improve strength and stamina.             Discharge Exercise Prescription (Final Exercise Prescription Changes):  Exercise Prescription Changes - 02/07/23 1400       Response to Exercise   Blood Pressure (Admit) 124/72    Blood Pressure (Exercise) 140/68    Blood Pressure (Exit) 120/78    Heart Rate (Admit) 70 bpm    Heart Rate (Exercise) 123 bpm    Heart Rate (Exit) 79 bpm    Rating of Perceived Exertion (Exercise) 15    Symptoms none    Duration Continue with 30 min of aerobic exercise without signs/symptoms of physical distress.    Intensity THRR unchanged      Progression   Progression Continue to progress workloads to maintain intensity without signs/symptoms of physical distress.  Average METs 3.26      Resistance Training   Training Prescription Yes    Weight 3 lb, 5 lb    Reps 10-15      Interval Training   Interval Training No      Treadmill   MPH 2.2    Grade 1    Minutes 15    METs 2.99      Recumbant Bike   Level 4    Watts 30    Minutes 15      REL-XR   Level 3    Minutes 15    METs 3.6      Track   Laps 52    Minutes 15    METs 3.83      Home Exercise Plan   Plans to continue exercise at Home (comment)   walking, elliptical   Frequency Add 3 additional days to program exercise sessions.    Initial Home Exercises Provided 01/31/23      Oxygen   Maintain Oxygen Saturation 88% or higher             Nutrition:  Target Goals: Understanding of nutrition guidelines, daily intake of sodium 1500mg ,  cholesterol 200mg , calories 30% from fat and 7% or less from saturated fats, daily to have 5 or more servings of fruits and vegetables.  Education: All About Nutrition: -Group instruction provided by verbal, written material, interactive activities, discussions, models, and posters to present general guidelines for heart healthy nutrition including fat, fiber, MyPlate, the role of sodium in heart healthy nutrition, utilization of the nutrition label, and utilization of this knowledge for meal planning. Follow up email sent as well. Written material given at graduation.   Biometrics:  Pre Biometrics - 01/19/23 1542       Pre Biometrics   Height 5' 8.5" (1.74 m)    Weight 163 lb 12.8 oz (74.3 kg)    Waist Circumference 36.5 inches    Hip Circumference 36 inches    Waist to Hip Ratio 1.01 %    BMI (Calculated) 24.54    Single Leg Stand 8.7 seconds              Nutrition Therapy Plan and Nutrition Goals:  Nutrition Therapy & Goals - 01/19/23 1522       Intervention Plan   Intervention Prescribe, educate and counsel regarding individualized specific dietary modifications aiming towards targeted core components such as weight, hypertension, lipid management, diabetes, heart failure and other comorbidities.    Expected Outcomes Short Term Goal: Understand basic principles of dietary content, such as calories, fat, sodium, cholesterol and nutrients.;Short Term Goal: A plan has been developed with personal nutrition goals set during dietitian appointment.;Long Term Goal: Adherence to prescribed nutrition plan.             Nutrition Assessments:  MEDIFICTS Score Key: ?70 Need to make dietary changes  40-70 Heart Healthy Diet ? 40 Therapeutic Level Cholesterol Diet  Flowsheet Row Cardiac Rehab from 01/19/2023 in [facility] Cardiac and Pulmonary Rehab  Picture Your Plate Total Score on Admission 77      Picture Your Plate Scores: <16 Unhealthy dietary pattern with much room for  improvement. 41-50 Dietary pattern unlikely to meet recommendations for good health and room for improvement. 51-60 More healthful dietary pattern, with some room for improvement.  >60 Healthy dietary pattern, although there may be some specific behaviors that could be improved.    Nutrition Goals Re-Evaluation:  Nutrition Goals Re-Evaluation  Row Name 01/31/23 1436             Goals   Nutrition Goal Review dietary information       Comment [name] is off to a good start in rehab.  He feels that he has some knowledge of a heart healthy diet, but he would like to meet with dietitian once they start.  In the mean time, he is trying to get in a good variety and monitor his sodium intake.  He was given a packet to review on heart healthy diet.       Expected Outcome Short: Review heart healthy diet paperwork Long: Meet with dietitian                Nutrition Goals Discharge (Final Nutrition Goals Re-Evaluation):  Nutrition Goals Re-Evaluation - 01/31/23 1436       Goals   Nutrition Goal Review dietary information    Comment [name] is off to a good start in rehab.  He feels that he has some knowledge of a heart healthy diet, but he would like to meet with dietitian once they start.  In the mean time, he is trying to get in a good variety and monitor his sodium intake.  He was given a packet to review on heart healthy diet.    Expected Outcome Short: Review heart healthy diet paperwork Long: Meet with dietitian             Psychosocial: Target Goals: Acknowledge presence or absence of significant depression and/or stress, maximize coping skills, provide positive support system. Participant is able to verbalize types and ability to use techniques and skills needed for reducing stress and depression.   Education: Stress, Anxiety, and Depression - Group verbal and visual presentation to define topics covered.  Reviews how body is impacted by stress, anxiety, and depression.  Also  discusses healthy ways to reduce stress and to treat/manage anxiety and depression.  Written material given at graduation.   Education: Sleep Hygiene -Provides group verbal and written instruction about how sleep can affect your health.  Define sleep hygiene, discuss sleep cycles and impact of sleep habits. Review good sleep hygiene tips.    Initial Review & Psychosocial Screening:  Initial Psych Review & Screening - 01/04/23 1410       Initial Review   Current issues with Current Sleep Concerns;Current Stress Concerns    Source of Stress Concerns Unable to participate in former interests or hobbies      Family Dynamics   Good Support System? Yes   wife, mother, sister, extended family     Barriers   Psychosocial barriers to participate in program There are no identifiable barriers or psychosocial needs.;The patient should benefit from training in stress management and relaxation.      Screening Interventions   Interventions Encouraged to exercise;To provide support and resources with identified psychosocial needs;Provide feedback about the scores to participant    Expected Outcomes Short Term goal: Utilizing psychosocial counselor, staff and physician to assist with identification of specific Stressors or current issues interfering with healing process. Setting desired goal for each stressor or current issue identified.;Long Term Goal: Stressors or current issues are controlled or eliminated.;Short Term goal: Identification and review with participant of any Quality of Life or Depression concerns found by scoring the questionnaire.;Long Term goal: The participant improves quality of Life and PHQ9 Scores as seen by post scores and/or verbalization of changes  Quality of Life Scores:   Quality of Life - 01/19/23 1522       Quality of Life   Select Quality of Life      Quality of Life Scores   Health/Function Pre 16.77 %    Socioeconomic Pre 20.38 %     Psych/Spiritual Pre 20.57 %    Family Pre 17.4 %    GLOBAL Post 18.44 %            Scores of 19 and below usually indicate a poorer quality of life in these areas.  A difference of  2-3 points is a clinically meaningful difference.  A difference of 2-3 points in the total score of the Quality of Life Index has been associated with significant improvement in overall quality of life, self-image, physical symptoms, and general health in studies assessing change in quality of life.  PHQ-9: Review Flowsheet  More data exists      01/19/2023 01/10/2023 12/14/2022 12/31/2021 11/09/2021  Depression screen PHQ 2/9  Decreased Interest 0 0 0 0 1  Down, Depressed, Hopeless 0 0 0 0 0  PHQ - 2 Score 0 0 0 0 1  Altered sleeping 3 0 1 0 0  Tired, decreased energy 2 - - 0 0  Change in appetite 0 0 0 0 0  Feeling bad or failure about yourself  0 0 1 0 1  Trouble concentrating 0 0 0 0 0  Moving slowly or fidgety/restless 1 0 0 0 0  Suicidal thoughts 0 0 0 0 0  PHQ-9 Score 6 0 2 0 2  Difficult doing work/chores Somewhat difficult Not difficult at all Somewhat difficult - -   Interpretation of Total Score  Total Score Depression Severity:  1-4 = Minimal depression, 5-9 = Mild depression, 10-14 = Moderate depression, 15-19 = Moderately severe depression, 20-27 = Severe depression   Psychosocial Evaluation and Intervention:  Psychosocial Evaluation - 01/04/23 1410       Psychosocial Evaluation & Interventions   Interventions Encouraged to exercise with the program and follow exercise prescription    Comments [name] is coming to 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 He has a history of spine and neck issues and he can't take his usual medication due to his newer cardiac meds. This is stressful for him and he is tired of being in pain. He has a history of sleep concerns, but his sleep habits have gotten worse since his MI. He wakes up around 3 and can't go back to sleep until around 7am. He states his doctors  can't give him a clear answer as to why. He did has his PCP about trying CBD oil for his peripheral neuropathy and sleep and she cleared him to do so. He is hopeful the CBD and getting more active in the program will help him sleep better. He can't stand for a long period of time related to his spinal issues and is currently not working. He is hopeful the program will help him understand his cardiac health and develop an exercise routine that fits his needs.    Expected Outcomes Short: attend cardiac rehab for education and exercise. Long: develop and maintain positive self care habits.    Continue Psychosocial Services  Follow up required by staff             Psychosocial Re-Evaluation:  Psychosocial Re-Evaluation     Row Name 01/31/23 1415             Psychosocial Re-Evaluation  Current issues with Current Stress Concerns;Current Depression       Comments [name] is doing well mentally.  He is hoping to meet with therapist soon. Still not sleeping well and has not talked with anyone yet.  He got a name from his pain management clinic to see someone that is used to dealing with more chornic diseases. The CBD oil is helping with his neuropathy some.  He is eager to get started talking to someone to see if it would help.       Expected Outcomes Short: Call and get appointment set up Long: Conitnue to work on improving sleep and coping with stress.       Interventions Encouraged to attend Cardiac Rehabilitation for the exercise;Stress management education       Continue Psychosocial Services  Follow up required by staff                Psychosocial Discharge (Final Psychosocial Re-Evaluation):  Psychosocial Re-Evaluation - 01/31/23 1415       Psychosocial Re-Evaluation   Current issues with Current Stress Concerns;Current Depression    Comments [name] is doing well mentally.  He is hoping to meet with therapist soon. Still not sleeping well and has not talked with anyone yet.  He got  a name from his pain management clinic to see someone that is used to dealing with more chornic diseases. The CBD oil is helping with his neuropathy some.  He is eager to get started talking to someone to see if it would help.    Expected Outcomes Short: Call and get appointment set up Long: Conitnue to work on improving sleep and coping with stress.    Interventions Encouraged to attend Cardiac Rehabilitation for the exercise;Stress management education    Continue Psychosocial Services  Follow up required by staff             Vocational Rehabilitation: Provide vocational rehab assistance to qualifying candidates.   Vocational Rehab Evaluation & Intervention:  Vocational Rehab - 01/04/23 1410       Initial Vocational Rehab Evaluation & Intervention   Assessment shows need for Vocational Rehabilitation No             Education: Education Goals: Education classes will be provided on a variety of topics geared toward better understanding of heart health and risk factor modification. Participant will state understanding/return demonstration of topics presented as noted by education test scores.  Learning Barriers/Preferences:  Learning Barriers/Preferences - 01/04/23 1407       Learning Barriers/Preferences   Learning Barriers None    Learning Preferences None             General Cardiac Education Topics:  AED/CPR: - Group verbal and written instruction with the use of models to demonstrate the basic use of the AED with the basic ABC's of resuscitation.   Anatomy and Cardiac Procedures: - Group verbal and visual presentation and models provide information about basic cardiac anatomy and function. Reviews the testing methods done to diagnose heart disease and the outcomes of the test results. Describes the treatment choices: Medical Management, Angioplasty, or Coronary Bypass Surgery for treating various heart conditions including Myocardial Infarction, Angina, Valve  Disease, and Cardiac Arrhythmias.  Written material given at graduation.   Medication Safety: - Group verbal and visual instruction to review commonly prescribed medications for heart and lung disease. Reviews the medication, class of the drug, and side effects. Includes the steps to properly store meds and maintain the prescription  regimen.  Written material given at graduation. Flowsheet Row Cardiac Rehab from 02/02/2023 in [facility] Cardiac and Pulmonary Rehab  Date 01/26/23  Educator SB  Instruction Review Code 1- Verbalizes Understanding       Intimacy: - Group verbal instruction through game format to discuss how heart and lung disease can affect sexual intimacy. Written material given at graduation..   Know Your Numbers and Heart Failure: - Group verbal and visual instruction to discuss disease risk factors for cardiac and pulmonary disease and treatment options.  Reviews associated critical values for Overweight/Obesity, Hypertension, Cholesterol, and Diabetes.  Discusses basics of heart failure: signs/symptoms and treatments.  Introduces Heart Failure Zone chart for action plan for heart failure.  Written material given at graduation. Flowsheet Row Cardiac Rehab from 02/02/2023 in [facility] Cardiac and Pulmonary Rehab  Date 02/02/23  Educator MS  Instruction Review Code 1- Verbalizes Understanding       Infection Prevention: - Provides verbal and written material to individual with discussion of infection control including proper hand washing and proper equipment cleaning during exercise session. Flowsheet Row Cardiac Rehab from 02/02/2023 in [facility] Cardiac and Pulmonary Rehab  Date 01/19/23  Educator NT  Instruction Review Code 1- Verbalizes Understanding       Falls Prevention: - Provides verbal and written material to individual with discussion of falls prevention and safety. Flowsheet Row Cardiac Rehab from 02/02/2023 in [facility] Cardiac and Pulmonary Rehab  Date 01/19/23  Educator NT   Instruction Review Code 1- Verbalizes Understanding       Other: -Provides group and verbal instruction on various topics (see comments)   Knowledge Questionnaire Score:  Knowledge Questionnaire Score - 01/19/23 1523       Knowledge Questionnaire Score   Pre Score 24/26             Core Components/Risk Factors/Patient Goals at Admission:  Personal Goals and Risk Factors at Admission - 01/19/23 1523       Core Components/Risk Factors/Patient Goals on Admission    Weight Management Yes;Weight Loss    Intervention Weight Management: Provide education and appropriate resources to help participant work on and attain dietary goals.;Weight Management: Develop a combined nutrition and exercise program designed to reach desired caloric intake, while maintaining appropriate intake of nutrient and fiber, sodium and fats, and appropriate energy expenditure required for the weight goal.;Weight Management/Obesity: Establish reasonable short term and long term weight goals.    Admit Weight 163 lb 12.8 oz (74.3 kg)    Goal Weight: Short Term 158 lb (71.7 kg)    Goal Weight: Long Term 153 lb (69.4 kg)    Expected Outcomes Long Term: Adherence to nutrition and physical activity/exercise program aimed toward attainment of established weight goal;Short Term: Continue to assess and modify interventions until short term weight is achieved;Weight Loss: Understanding of general recommendations for a balanced deficit meal plan, which promotes 1-2 lb weight loss per week and includes a negative energy balance of [phone] kcal/d;Understanding recommendations for meals to include 15-35% energy as protein, 25-35% energy from fat, 35-60% energy from carbohydrates, less than 200mg  of dietary cholesterol, 20-35 gm of total fiber daily;Understanding of distribution of calorie intake throughout the day with the consumption of 4-5 meals/snacks    Hypertension Yes    Intervention Provide education on lifestyle  modifcations including regular physical activity/exercise, weight management, moderate sodium restriction and increased consumption of fresh fruit, vegetables, and low fat dairy, alcohol moderation, and smoking cessation.;Monitor prescription use compliance.    Expected Outcomes Short  Term: Continued assessment and intervention until BP is < 140/54mm HG in hypertensive participants. < 130/27mm HG in hypertensive participants with diabetes, heart failure or chronic kidney disease.;Long Term: Maintenance of blood pressure at goal levels.    Lipids Yes    Intervention Provide education and support for participant on nutrition & aerobic/resistive exercise along with prescribed medications to achieve LDL 70mg , HDL >40mg .    Expected Outcomes Short Term: Participant states understanding of desired cholesterol values and is compliant with medications prescribed. Participant is following exercise prescription and nutrition guidelines.;Long Term: Cholesterol controlled with medications as prescribed, with individualized exercise RX and with personalized nutrition plan. Value goals: LDL < 70mg , HDL > 40 mg.             Education:Diabetes - Individual verbal and written instruction to review signs/symptoms of diabetes, desired ranges of glucose level fasting, after meals and with exercise. Acknowledge that pre and post exercise glucose checks will be done for 3 sessions at entry of program.   Core Components/Risk Factors/Patient Goals Review:   Goals and Risk Factor Review     Row Name 01/31/23 1417             Core Components/Risk Factors/Patient Goals Review   Personal Goals Review Weight Management/Obesity;Hypertension       Review [name] is off to a good start in rehab.  He would like to work on losing weight and has already started on that trend.  His wife is helping to keep an eye on his pressures and they check in on occassion at home.       Expected Outcomes Short: Continue to work on weight  loss Long: Continue to monitor risk factors                Core Components/Risk Factors/Patient Goals at Discharge (Final Review):   Goals and Risk Factor Review - 01/31/23 1417       Core Components/Risk Factors/Patient Goals Review   Personal Goals Review Weight Management/Obesity;Hypertension    Review [name] is off to a good start in rehab.  He would like to work on losing weight and has already started on that trend.  His wife is helping to keep an eye on his pressures and they check in on occassion at home.    Expected Outcomes Short: Continue to work on weight loss Long: Continue to monitor risk factors             ITP Comments:  ITP Comments     Row Name 01/04/23 1423 01/19/23 1520 01/24/23 1345 02/09/23 0920     ITP Comments Initial phone call completed. Diagnosis can be found in [facility] 3/15. EP Orientation scheduled for Wednesday 4/24 at 1:30pm. Completed and gym orientation. Initial ITP created and sent for review to Dr. Bethann Punches, Medical Director. First full day of exercise!  Patient was oriented to gym and equipment including functions, settings, policies, and procedures.  [name] individual exercise prescription and treatment plan were reviewed.  All starting workloads were established based on the results of the 6 minute walk test done at initial orientation visit.  The plan for exercise progression was also introduced and progression will be customized based on patient's performance and goals. 30 Day review completed. Medical Director ITP review done, changes made as directed, and signed approval by Medical Director.   new to program             Comments:

## 2023-02-16 ENCOUNTER — Encounter: Payer: PPO | Admitting: *Deleted

## 2023-02-16 DIAGNOSIS — I214 Non-ST elevation (NSTEMI) myocardial infarction: Secondary | ICD-10-CM | POA: Diagnosis not present

## 2023-02-16 DIAGNOSIS — Z955 Presence of coronary angioplasty implant and graft: Secondary | ICD-10-CM

## 2023-02-16 NOTE — Progress Notes (Signed)
Daily Session Note  Patient Details  Name: [name] MRN: [id number] Date of Birth: [birth year] Referring Provider:   Flowsheet Row Cardiac Rehab from 01/19/2023 in [facility] Cardiac and Pulmonary Rehab  Referring Provider Dr. Bryan Lemma, MD       Encounter Date: 02/16/2023  Check In:  Session Check In - 02/16/23 1410       Check-In   Supervising physician immediately available to respond to emergencies See telemetry face sheet for immediately available ER MD    Location [facility]-Cardiac & Pulmonary Rehab    Staff Present Bess Kinds RN, Atilano Median, RN, ADN;Joseph Hood, RCP,RRT,BSRT;Kelly Nassau, BS, ACSM CEP, Exercise Physiologist    Virtual Visit No    Medication changes reported     Yes    Comments trazadone changed to 100mg     Fall or balance concerns reported    Yes   stubbed his toe and fell. No injury   Tobacco Cessation No Change    Warm-up and Cool-down Performed on first and last piece of equipment    Resistance Training Performed Yes    VAD Patient? No    PAD/SET Patient? No      Pain Assessment   Currently in Pain? No/denies                Social History   Tobacco Use  Smoking Status Former  Smokeless Tobacco Never  Tobacco Comments   quit 35 years    Goals Met:  Independence with exercise equipment Exercise tolerated well No report of concerns or symptoms today Strength training completed today  Goals Unmet:  Not Applicable  Comments: Pt able to follow exercise prescription today without complaint.  Will continue to monitor for progression.    Dr. Bethann Punches is Medical Director for [facility] Cardiac Rehabilitation.  Dr. Vida Rigger is Medical Director for [facility] Pulmonary Rehabilitation.

## 2023-02-23 ENCOUNTER — Encounter: Payer: PPO | Admitting: *Deleted

## 2023-02-23 DIAGNOSIS — I214 Non-ST elevation (NSTEMI) myocardial infarction: Secondary | ICD-10-CM | POA: Diagnosis not present

## 2023-02-23 DIAGNOSIS — Z955 Presence of coronary angioplasty implant and graft: Secondary | ICD-10-CM

## 2023-02-23 NOTE — Progress Notes (Signed)
Daily Session Note  Patient Details  Name: [name] MRN: [id number] Date of Birth: 1961/04/29 Referring Provider:   Flowsheet Row Cardiac Rehab from 01/19/2023 in [facility] Cardiac and Pulmonary Rehab  Referring Provider Dr. Bryan Lemma, MD       Encounter Date: 02/23/2023  Check In:  Session Check In - 02/23/23 1343       Check-In   Supervising physician immediately available to respond to emergencies See telemetry face sheet for immediately available ER MD    Location [facility]-Cardiac & Pulmonary Rehab    Staff Present Susann Givens, RN Atilano Median, RN, ADN;Jessica Juanetta Gosling, MA, RCEP, CCRP, CCET    Virtual Visit No    Medication changes reported     No    Fall or balance concerns reported    No    Warm-up and Cool-down Performed on first and last piece of equipment    Resistance Training Performed Yes    VAD Patient? No    PAD/SET Patient? No      Pain Assessment   Currently in Pain? No/denies                Social History   Tobacco Use  Smoking Status Former  Smokeless Tobacco Never  Tobacco Comments   quit 35 years    Goals Met:  Independence with exercise equipment Exercise tolerated well No report of concerns or symptoms today Strength training completed today  Goals Unmet:  Not Applicable  Comments: Pt able to follow exercise prescription today without complaint.  Will continue to monitor for progression.    Dr. Bethann Punches is Medical Director for [facility] Cardiac Rehabilitation.  Dr. Vida Rigger is Medical Director for [facility] Pulmonary Rehabilitation.

## 2023-02-28 ENCOUNTER — Encounter: Payer: PPO | Attending: Cardiology | Admitting: *Deleted

## 2023-02-28 DIAGNOSIS — Z955 Presence of coronary angioplasty implant and graft: Secondary | ICD-10-CM | POA: Insufficient documentation

## 2023-02-28 DIAGNOSIS — I214 Non-ST elevation (NSTEMI) myocardial infarction: Secondary | ICD-10-CM | POA: Insufficient documentation

## 2023-02-28 NOTE — Progress Notes (Signed)
Daily Session Note  Patient Details  Name: [name] MRN: [id number] Date of Birth: August 01, 1961 Referring Provider:   Flowsheet Row Cardiac Rehab from 01/19/2023 in [facility] Cardiac and Pulmonary Rehab  Referring Provider Dr. Bryan Lemma, MD       Encounter Date: 02/28/2023  Check In:  Session Check In - 02/28/23 1351       Check-In   Supervising physician immediately available to respond to emergencies See telemetry face sheet for immediately available ER MD    Location [facility]-Cardiac & Pulmonary Rehab    Staff Present Cyndia Diver, RN, BSN, Cammie Sickle, Guinevere Ferrari, RN, ADN    Virtual Visit No    Medication changes reported     No    Fall or balance concerns reported    No    Tobacco Cessation No Change    Warm-up and Cool-down Performed on first and last piece of equipment    Resistance Training Performed Yes    VAD Patient? No    PAD/SET Patient? No      Pain Assessment   Currently in Pain? No/denies                Social History   Tobacco Use  Smoking Status Former  Smokeless Tobacco Never  Tobacco Comments   quit 35 years    Goals Met:  Independence with exercise equipment Exercise tolerated well No report of concerns or symptoms today  Goals Unmet:  Not Applicable  Comments: Pt able to follow exercise prescription today without complaint.  Will continue to monitor for progression.    Dr. Bethann Punches is Medical Director for [facility] Cardiac Rehabilitation.  Dr. Vida Rigger is Medical Director for [facility] Pulmonary Rehabilitation.

## 2023-03-07 ENCOUNTER — Encounter: Payer: PPO | Admitting: *Deleted

## 2023-03-07 DIAGNOSIS — I214 Non-ST elevation (NSTEMI) myocardial infarction: Secondary | ICD-10-CM | POA: Diagnosis not present

## 2023-03-07 DIAGNOSIS — Z955 Presence of coronary angioplasty implant and graft: Secondary | ICD-10-CM

## 2023-03-07 NOTE — Progress Notes (Signed)
Daily Session Note  Patient Details  Name: [name] MRN: [id number] Date of Birth: [birth year] Referring Provider:   Flowsheet Row Cardiac Rehab from 01/19/2023 in [facility] Cardiac and Pulmonary Rehab  Referring Provider Dr. Bryan Lemma, MD       Encounter Date: 03/07/2023  Check In:  Session Check In - 03/07/23 1340       Check-In   Supervising physician immediately available to respond to emergencies See telemetry face sheet for immediately available ER MD    Location [facility]-Cardiac & Pulmonary Rehab    Staff Present Susann Givens, RN BSN;Noah Tickle, BS, Exercise Physiologist;Laureen Manson Passey, BS, RRT, CPFT    Virtual Visit No    Medication changes reported     No    Fall or balance concerns reported    No    Warm-up and Cool-down Performed on first and last piece of equipment    Resistance Training Performed Yes    VAD Patient? No    PAD/SET Patient? No      Pain Assessment   Currently in Pain? No/denies                Social History   Tobacco Use  Smoking Status Former  Smokeless Tobacco Never  Tobacco Comments   quit 35 years    Goals Met:  Independence with exercise equipment Exercise tolerated well No report of concerns or symptoms today Strength training completed today  Goals Unmet:  Not Applicable  Comments: Pt able to follow exercise prescription today without complaint.  Will continue to monitor for progression.    Dr. Bethann Punches is Medical Director for [facility] Cardiac Rehabilitation.  Dr. Vida Rigger is Medical Director for [facility] Pulmonary Rehabilitation.

## 2023-03-09 ENCOUNTER — Encounter: Payer: Self-pay | Admitting: *Deleted

## 2023-03-09 DIAGNOSIS — Z955 Presence of coronary angioplasty implant and graft: Secondary | ICD-10-CM

## 2023-03-09 DIAGNOSIS — I214 Non-ST elevation (NSTEMI) myocardial infarction: Secondary | ICD-10-CM

## 2023-03-09 NOTE — Progress Notes (Signed)
Cardiac Individual Treatment Plan  Patient Details  Name: [name] MRN: [id number] Date of Birth: December 15, 1960 Referring Provider:   Flowsheet Row Cardiac Rehab from 01/19/2023 in [facility] Cardiac and Pulmonary Rehab  Referring Provider Dr. Bryan Lemma, MD       Initial Encounter Date:  Flowsheet Row Cardiac Rehab from 01/19/2023 in [facility] Cardiac and Pulmonary Rehab  Date 01/19/23       Visit Diagnosis: NSTEMI (non-ST elevated myocardial infarction) [facility])  Status post coronary artery stent placement  [name] Home Medications on Admission:  Current Outpatient Medications:    acyclovir (ZOVIRAX) 800 MG tablet, Take 1 tablet (800 mg total) by mouth 2 (two) times daily. (Patient not taking: Reported on 01/17/2023), Disp: 180 tablet, Rfl: 3   amLODipine (NORVASC) 10 MG tablet, TAKE 1 TABLET BY MOUTH DAILY (Patient taking differently: Take 10 mg by mouth every evening.), Disp: 90 tablet, Rfl: 3   aspirin EC 81 MG tablet, Take 1 tablet (81 mg total) by mouth daily. Swallow whole., Disp: 30 tablet, Rfl: 12   atorvastatin (LIPITOR) 80 MG tablet, Take 1 tablet (80 mg total) by mouth daily., Disp: 90 tablet, Rfl: 2   baclofen (LIORESAL) 20 MG tablet, Take 1 tablet (20 mg total) by mouth every 8 (eight) hours as needed for muscle spasms., Disp: 60 each, Rfl: 5   cetirizine (ZYRTEC) 10 MG tablet, Take 10 mg by mouth daily. (Patient not taking: Reported on 01/17/2023), Disp: , Rfl:    diazepam (VALIUM) 10 MG tablet, Take 10 mg by mouth 2 (two) times daily., Disp: , Rfl:    diclofenac sodium (VOLTAREN) 1 % GEL, Apply 1 application  topically daily as needed (For pain). (Patient not taking: Reported on 01/17/2023), Disp: , Rfl:    EPINEPHrine 0.3 mg/0.3 mL IJ SOAJ injection, USE AS DIRECTED (Patient not taking: Reported on 01/17/2023), Disp: 2 each, Rfl: 1   fentaNYL (DURAGESIC - DOSED MCG/HR) 50 MCG/HR, Place 50 mcg onto the skin every other day., Disp: , Rfl:    fluticasone (FLONASE) 50  MCG/ACT nasal spray, USE TWO SPRAYS IN EACH NOSTRIL DAILY (Patient not taking: Reported on 01/17/2023), Disp: 16 g, Rfl: 11   lactulose, encephalopathy, (CHRONULAC) 10 GM/15ML SOLN, Take 20 g by mouth at bedtime., Disp: , Rfl:    metoprolol succinate (TOPROL-XL) 25 MG 24 hr tablet, Take 1 tablet (25 mg total) by mouth at bedtime., Disp: 90 tablet, Rfl: 2   nitroGLYCERIN (NITROSTAT) 0.4 MG SL tablet, Place 1 tablet (0.4 mg total) under the tongue every 5 (five) minutes as needed for chest pain., Disp: 100 tablet, Rfl: 3   oxyCODONE (OXY IR/ROXICODONE) 5 MG immediate release tablet, Take 5 mg by mouth every 6 (six) hours as needed for moderate pain. (Patient not taking: Reported on 01/17/2023), Disp: , Rfl: 0   promethazine (PHENERGAN) 25 MG tablet, Take 12.5 mg by mouth every 6 (six) hours as needed for nausea or vomiting., Disp: , Rfl:    ticagrelor (BRILINTA) 90 MG TABS tablet, Take 1 tablet (90 mg total) by mouth 2 (two) times daily., Disp: 60 tablet, Rfl: 11   traZODone (DESYREL) 100 MG tablet, Take 50 mg by mouth at bedtime., Disp: , Rfl:    triamcinolone cream (KENALOG) 0.1 %, Apply 1 application topically 2 (two) times daily. (Patient not taking: Reported on 01/17/2023), Disp: 30 g, Rfl: 0  Past Medical History: Past Medical History:  Diagnosis Date   ADHD (attention deficit hyperactivity disorder), inattentive type    sees Dr.  Deboraha Sprang in [place], [place]    Anxiety    sees Dr. Deboraha Sprang in [place], [place]    Arthritis    Benign enlargement of prostate    sees [facility]    CAD- 1 Vessel, DES PCI LCx. (NSTEMI) 12/10/2022   LHC 12/10/2022:  Minimal LM & LAD disease. Mid Cx 99% (subtotal occlusion) =>  PCI using a 2.25 x 24 mm Synergy DES; pRCA 30% & mRCA 50%.      Carpal tunnel syndrome on left    sees Dr. Kathie Dike at [facility]    Chronic pain    sees Dr. Thyra Breed (meds) and Altamease Oiler (accupuncture)    Depression    sees Dr. Deboraha Sprang   Functional impotence     Hyperlipidemia with target LDL less than 70 07/09/2014   Hypogonadism in male    sees Carollee Herter (a [place]) at [facility]    Insomnia    Kidney stones    NSTEMI (non-ST elevated myocardial infarction) (HCC) 12/10/2022   Admitted with stuttering chest pain, troponin elevation. => 9 9% LCx=> DES stent.  Normal EF on echo.   Sinusitis, acute 07/17/2016   Urticaria due to cold     Tobacco Use: Social History   Tobacco Use  Smoking Status Former  Smokeless Tobacco Never  Tobacco Comments   quit 35 years    Labs: Review Flowsheet  More data exists      Latest Ref Rng & Units 04/03/2019 06/05/2020 11/09/2021 12/10/2022 12/11/2022  Labs for ITP Cardiac and Pulmonary Rehab  Cholestrol 0 - 200 mg/dL 409  811  914  782  -  LDL (calc) 0 - 99 mg/dL 956  213  086  578  -  HDL-C >40 mg/dL 46.96  58  29.52  61  -  Trlycerides <150 mg/dL 841.3  244  010.2  71  -  Hemoglobin A1c 4.8 - 5.6 % - 5.8  6.0  - 5.9      Exercise Target Goals: Exercise Program Goal: Individual exercise prescription set using results from initial 6 min walk test and THRR while considering  patient's activity barriers and safety.   Exercise Prescription Goal: Initial exercise prescription builds to 30-45 minutes a day of aerobic activity, 2-3 days per week.  Home exercise guidelines will be given to patient during program as part of exercise prescription that the participant will acknowledge.   Education: Aerobic Exercise: - Group verbal and visual presentation on the components of exercise prescription. Introduces F.I.T.T principle from ACSM for exercise prescriptions.  Reviews F.I.T.T. principles of aerobic exercise including progression. Written material given at graduation. Flowsheet Row Cardiac Rehab from 02/23/2023 in [facility] Cardiac and Pulmonary Rehab  Education need identified 01/19/23       Education: Resistance Exercise: - Group verbal and visual presentation on the components of exercise prescription.  Introduces F.I.T.T principle from ACSM for exercise prescriptions  Reviews F.I.T.T. principles of resistance exercise including progression. Written material given at graduation.    Education: Exercise & Equipment Safety: - Individual verbal instruction and demonstration of equipment use and safety with use of the equipment. Flowsheet Row Cardiac Rehab from 02/23/2023 in [facility] Cardiac and Pulmonary Rehab  Date 01/19/23  Educator NT  Instruction Review Code 1- Verbalizes Understanding       Education: Exercise Physiology & General Exercise Guidelines: - Group verbal and written instruction with models to review the exercise physiology of the cardiovascular system and associated critical values. Provides general exercise guidelines with  specific guidelines to those with heart or lung disease.  Flowsheet Row Cardiac Rehab from 02/23/2023 in [facility] Cardiac and Pulmonary Rehab  Date 02/23/23  Educator [facility]  Instruction Review Code 1- Verbalizes Understanding       Education: Flexibility, Balance, Mind/Body Relaxation: - Group verbal and visual presentation with interactive activity on the components of exercise prescription. Introduces F.I.T.T principle from ACSM for exercise prescriptions. Reviews F.I.T.T. principles of flexibility and balance exercise training including progression. Also discusses the mind body connection.  Reviews various relaxation techniques to help reduce and manage stress (i.e. Deep breathing, progressive muscle relaxation, and visualization). Balance handout provided to take home. Written material given at graduation.   Activity Barriers & Risk Stratification:  Activity Barriers & Cardiac Risk Stratification - 01/19/23 1536       Activity Barriers & Cardiac Risk Stratification   Activity Barriers Back Problems;Shortness of Breath;Neck/Spine Problems;Other (comment)    Comments Torn rotator cuff last year, neuropathy causes pain in feet    Cardiac Risk Stratification  Moderate             6 Minute Walk:  6 Minute Walk     Row Name 01/19/23 1525         6 Minute Walk   Phase Initial     Distance 1750 feet     Walk Time 6 minutes     # of Rest Breaks 0     MPH 3.31     METS 4.52     RPE 11     Perceived Dyspnea  1     VO2 Peak 15.83     Symptoms Yes (comment)     Comments Neck Pain 3/10, Feet Pain 4/10     Resting HR 78 bpm     Resting BP 120/78     Resting Oxygen Saturation  97 %     Exercise Oxygen Saturation  during 6 min walk 95 %     Max Ex. HR 107 bpm     Max Ex. BP 134/74     2 Minute Post BP 112/68              Oxygen Initial Assessment:   Oxygen Re-Evaluation:   Oxygen Discharge (Final Oxygen Re-Evaluation):   Initial Exercise Prescription:  Initial Exercise Prescription - 01/19/23 1500       Date of Initial Exercise RX and Referring Provider   Date 01/19/23    Referring Provider Dr. Bryan Lemma, MD      Oxygen   Maintain Oxygen Saturation 88% or higher      Treadmill   MPH 2.8    Grade 1    Minutes 15    METs 3.53      Recumbant Bike   Level 3    RPM 50    Watts 59    Minutes 15    METs 4.52      NuStep   Level 4    SPM 80    Minutes 15    METs 4.52      REL-XR   Level 2    Speed 50    Minutes 15    METs 4.52      Prescription Details   Frequency (times per week) 2    Duration Progress to 30 minutes of continuous aerobic without signs/symptoms of physical distress      Intensity   THRR 40-80% of Max Heartrate 110-143    Ratings of Perceived Exertion 11-13  Perceived Dyspnea 0-4      Progression   Progression Continue to progress workloads to maintain intensity without signs/symptoms of physical distress.      Resistance Training   Training Prescription Yes    Weight 3 lb, 5 lb    Reps 10-15             Perform Capillary Blood Glucose checks as needed.  Exercise Prescription Changes:   Exercise Prescription Changes     Row Name 01/19/23 1500 01/26/23 1600  01/31/23 1400 02/07/23 1400 02/22/23 0800     Response to Exercise   Blood Pressure (Admit) 120/78 122/64 -- 124/72 116/62   Blood Pressure (Exercise) 134/74 138/62 -- 140/68 112/60   Blood Pressure (Exit) 112/68 102/64 -- 120/78 110/60   Heart Rate (Admit) 78 bpm 75 bpm -- 70 bpm 93 bpm   Heart Rate (Exercise) 107 bpm 97 bpm -- 123 bpm 114 bpm   Heart Rate (Exit) 81 bpm 74 bpm -- 79 bpm 100 bpm   Oxygen Saturation (Admit) 97 % -- -- -- --   Oxygen Saturation (Exercise) 95 % -- -- -- --   Oxygen Saturation (Exit) 97 % -- -- -- --   Rating of Perceived Exertion (Exercise) 11 15 -- 15 13   Perceived Dyspnea (Exercise) 1 -- -- -- --   Symptoms Neck pain 3/10, Feet Pain 4/10 none -- none none   Comments Results First full day of exercise -- -- --   Duration -- Continue with 30 min of aerobic exercise without signs/symptoms of physical distress. -- Continue with 30 min of aerobic exercise without signs/symptoms of physical distress. Continue with 30 min of aerobic exercise without signs/symptoms of physical distress.   Intensity -- THRR unchanged -- THRR unchanged THRR unchanged     Progression   Progression -- Continue to progress workloads to maintain intensity without signs/symptoms of physical distress. -- Continue to progress workloads to maintain intensity without signs/symptoms of physical distress. Continue to progress workloads to maintain intensity without signs/symptoms of physical distress.   Average METs -- 2.99 -- 3.26 3.38     Resistance Training   Training Prescription -- Yes -- Yes Yes   Weight -- 3 lb, 5 lb -- 3 lb, 5 lb 3 lb   Reps -- 10-15 -- 10-15 10-15     Interval Training   Interval Training -- No -- No No     Treadmill   MPH -- 2.2 -- 2.2 --   Grade -- 1 -- 1 --   Minutes -- 15 -- 15 --   METs -- 2.99 -- 2.99 --     Recumbant Bike   Level -- -- -- 4 4   Watts -- -- -- 30 32   Minutes -- -- -- 15 15   METs -- -- -- -- 2.62     REL-XR   Level -- 2 --  3 3   Minutes -- 15 -- 15 15   METs -- -- -- 3.6 3.6     Track   Laps -- -- -- 52 42   Minutes -- -- -- 15 15   METs -- -- -- 3.83 --     Home Exercise Plan   Plans to continue exercise at -- -- Home (comment)  walking, elliptical Home (comment)  walking, elliptical Home (comment)  walking, elliptical   Frequency -- -- Add 3 additional days to program exercise sessions. Add 3 additional days to program exercise sessions.  Add 3 additional days to program exercise sessions.   Initial Home Exercises Provided -- -- 01/31/23 01/31/23 01/31/23     Oxygen   Maintain Oxygen Saturation -- 88% or higher -- 88% or higher 88% or higher    Row Name 03/07/23 1600             Response to Exercise   Blood Pressure (Admit) 102/60       Blood Pressure (Exercise) 130/80       Blood Pressure (Exit) 110/60       Heart Rate (Admit) 83 bpm       Heart Rate (Exercise) 111 bpm       Heart Rate (Exit) 89 bpm       Rating of Perceived Exertion (Exercise) 13       Symptoms none       Duration Continue with 30 min of aerobic exercise without signs/symptoms of physical distress.       Intensity THRR unchanged         Progression   Progression Continue to progress workloads to maintain intensity without signs/symptoms of physical distress.       Average METs 3.56         Resistance Training   Training Prescription Yes       Weight 3 lb       Reps 10-15         Interval Training   Interval Training No         Recumbant Bike   Level 5       Watts 34       Minutes 15       METs 3.47         REL-XR   Level 4       Minutes 15       METs 4.3         Track   Laps 44       Minutes 15       METs 3.39         Home Exercise Plan   Plans to continue exercise at Home (comment)  walking, elliptical       Frequency Add 3 additional days to program exercise sessions.       Initial Home Exercises Provided 01/31/23         Oxygen   Maintain Oxygen Saturation 88% or higher                 Exercise Comments:   Exercise Comments     Row Name 01/24/23 1345           Exercise Comments First full day of exercise!  Patient was oriented to gym and equipment including functions, settings, policies, and procedures.  [name] individual exercise prescription and treatment plan were reviewed.  All starting workloads were established based on the results of the 6 minute walk test done at initial orientation visit.  The plan for exercise progression was also introduced and progression will be customized based on patient's performance and goals.                Exercise Goals and Review:   Exercise Goals     Row Name 01/19/23 1540             Exercise Goals   Increase Physical Activity Yes       Intervention Provide advice, education, support and counseling about physical activity/exercise needs.;Develop an individualized exercise prescription for aerobic and resistive training  based on initial evaluation findings, risk stratification, comorbidities and participant's personal goals.       Expected Outcomes Short Term: Attend rehab on a regular basis to increase amount of physical activity.;Long Term: Add in home exercise to make exercise part of routine and to increase amount of physical activity.;Long Term: Exercising regularly at least 3-5 days a week.       Increase Strength and Stamina Yes       Intervention Provide advice, education, support and counseling about physical activity/exercise needs.;Develop an individualized exercise prescription for aerobic and resistive training based on initial evaluation findings, risk stratification, comorbidities and participant's personal goals.       Expected Outcomes Short Term: Increase workloads from initial exercise prescription for resistance, speed, and METs.;Short Term: Perform resistance training exercises routinely during rehab and add in resistance training at home;Long Term: Improve cardiorespiratory fitness, muscular  endurance and strength as measured by increased METs and functional capacity ( )       Able to understand and use rate of perceived exertion (RPE) scale Yes       Intervention Provide education and explanation on how to use RPE scale       Expected Outcomes Short Term: Able to use RPE daily in rehab to express subjective intensity level;Long Term:  Able to use RPE to guide intensity level when exercising independently       Able to understand and use Dyspnea scale Yes       Intervention Provide education and explanation on how to use Dyspnea scale       Expected Outcomes Short Term: Able to use Dyspnea scale daily in rehab to express subjective sense of shortness of breath during exertion;Long Term: Able to use Dyspnea scale to guide intensity level when exercising independently       Knowledge and understanding of Target Heart Rate Range (THRR) Yes       Intervention Provide education and explanation of THRR including how the numbers were predicted and where they are located for reference       Expected Outcomes Long Term: Able to use THRR to govern intensity when exercising independently;Short Term: Able to state/look up THRR;Short Term: Able to use daily as guideline for intensity in rehab       Able to check pulse independently Yes       Intervention Provide education and demonstration on how to check pulse in carotid and radial arteries.;Review the importance of being able to check your own pulse for safety during independent exercise       Expected Outcomes Short Term: Able to explain why pulse checking is important during independent exercise;Long Term: Able to check pulse independently and accurately       Understanding of Exercise Prescription Yes       Intervention Provide education, explanation, and written materials on patient's individual exercise prescription       Expected Outcomes Short Term: Able to explain program exercise prescription;Long Term: Able to explain home exercise  prescription to exercise independently                Exercise Goals Re-Evaluation :  Exercise Goals Re-Evaluation     Row Name 01/24/23 1345 01/26/23 1625 01/31/23 1441 02/07/23 1433 02/22/23 0825     Exercise Goal Re-Evaluation   Exercise Goals Review Increase Physical Activity;Able to understand and use rate of perceived exertion (RPE) scale;Knowledge and understanding of Target Heart Rate Range (THRR);Understanding of Exercise Prescription;Increase Strength and Stamina;Able to check pulse independently  Increase Physical Activity;Increase Strength and Stamina;Understanding of Exercise Prescription Increase Physical Activity;Increase Strength and Stamina;Understanding of Exercise Prescription Increase Physical Activity;Increase Strength and Stamina;Understanding of Exercise Prescription Increase Physical Activity;Increase Strength and Stamina;Understanding of Exercise Prescription   Comments Reviewed RPE scale, THR and program prescription with pt today.  Pt voiced understanding and was given a copy of goals to take home. [name] is off to a good start in the program. During his first day of exercise he was able to walk on the treadmill at a speed of 2.2 mph with a 1% incline. He also did well on the XR at level 2. He tolerated using a 3 lb and a 5 lb hand weight for resistance training as well. We will continue to monitor his progress in the program [name] is already exercising at home on his off days. Reviewed home exercise with pt today.  Pt plans to walk and use elliptical at home for exercise.  Reviewed THR, pulse, RPE, sign and symptoms, pulse oximetery and when to call 911 or MD.  Also discussed weather considerations and indoor options.  Pt voiced understanding. [name] is doing well in the program. He is now walking up to 52 laps on the track and increased his overall average MET level to 3.26 METs. He also improved to level 4 on the recumbent bike and level 3 on the XR. He has continued to use  3 lb hand weights for resistance training as well. We will continue to monitor his progress in the program. [name] is doing well in rehab.  He is up to 32 watts on the bike and consistently walking a mile in his time on the track.  We will encourage him to try heavier weights as well.  We will continue to monitor his progress.   Expected Outcomes Short: Use RPE daily to regulate intensity.  Long: Follow program prescription in THR. Short: Continue to follow current exercise prescription. Long: Continue to increase MET level and stamina. Short: Continue to exercise at home on off days Long: Continue to follow program prescription Short: Try 4 lb hand weights for resistance training, Long: Conitnue to improve strength and stamina. Short: Increase hand weights Long: Continue to improve stamina    Row Name 02/28/23 1405 03/07/23 1701           Exercise Goal Re-Evaluation   Exercise Goals Review Increase Physical Activity;Increase Strength and Stamina;Understanding of Exercise Prescription Increase Physical Activity;Increase Strength and Stamina;Understanding of Exercise Prescription      Comments [name] is doing well in rehab. He is going to clubhouse at [facility] for exercise and planning to add in swimming soon at the the pool.  He is looking forward to getting into pool to have it go easiser on his joints. [name] continues to do well in rehab. He recently improved to level 4 on the XR and level 5 on the recumbent bike. He also increased his overall average MET level to 3.56 METs. He has consistently walked above 40 laps on the track as well. We will continue to monitor his progress in the program.      Expected Outcomes Short: Get in the pool Long: Continue to exercise independently Short: Continue to progressively increase workloads and push for more laps on the track. Long: Continue to improve strength and stamina.               Discharge Exercise Prescription (Final Exercise Prescription  Changes):  Exercise Prescription Changes - 03/07/23 1600  Response to Exercise   Blood Pressure (Admit) 102/60    Blood Pressure (Exercise) 130/80    Blood Pressure (Exit) 110/60    Heart Rate (Admit) 83 bpm    Heart Rate (Exercise) 111 bpm    Heart Rate (Exit) 89 bpm    Rating of Perceived Exertion (Exercise) 13    Symptoms none    Duration Continue with 30 min of aerobic exercise without signs/symptoms of physical distress.    Intensity THRR unchanged      Progression   Progression Continue to progress workloads to maintain intensity without signs/symptoms of physical distress.    Average METs 3.56      Resistance Training   Training Prescription Yes    Weight 3 lb    Reps 10-15      Interval Training   Interval Training No      Recumbant Bike   Level 5    Watts 34    Minutes 15    METs 3.47      REL-XR   Level 4    Minutes 15    METs 4.3      Track   Laps 44    Minutes 15    METs 3.39      Home Exercise Plan   Plans to continue exercise at Home (comment)   walking, elliptical   Frequency Add 3 additional days to program exercise sessions.    Initial Home Exercises Provided 01/31/23      Oxygen   Maintain Oxygen Saturation 88% or higher             Nutrition:  Target Goals: Understanding of nutrition guidelines, daily intake of sodium 1500mg , cholesterol 200mg , calories 30% from fat and 7% or less from saturated fats, daily to have 5 or more servings of fruits and vegetables.  Education: All About Nutrition: -Group instruction provided by verbal, written material, interactive activities, discussions, models, and posters to present general guidelines for heart healthy nutrition including fat, fiber, MyPlate, the role of sodium in heart healthy nutrition, utilization of the nutrition label, and utilization of this knowledge for meal planning. Follow up email sent as well. Written material given at graduation.   Biometrics:  Pre Biometrics -  01/19/23 1542       Pre Biometrics   Height 5' 8.5" (1.74 m)    Weight 163 lb 12.8 oz (74.3 kg)    Waist Circumference 36.5 inches    Hip Circumference 36 inches    Waist to Hip Ratio 1.01 %    BMI (Calculated) 24.54    Single Leg Stand 8.7 seconds              Nutrition Therapy Plan and Nutrition Goals:  Nutrition Therapy & Goals - 01/19/23 1522       Intervention Plan   Intervention Prescribe, educate and counsel regarding individualized specific dietary modifications aiming towards targeted core components such as weight, hypertension, lipid management, diabetes, heart failure and other comorbidities.    Expected Outcomes Short Term Goal: Understand basic principles of dietary content, such as calories, fat, sodium, cholesterol and nutrients.;Short Term Goal: A plan has been developed with personal nutrition goals set during dietitian appointment.;Long Term Goal: Adherence to prescribed nutrition plan.             Nutrition Assessments:  MEDIFICTS Score Key: ?70 Need to make dietary changes  40-70 Heart Healthy Diet ? 40 Therapeutic Level Cholesterol Diet  Flowsheet Row Cardiac Rehab from 01/19/2023 in [facility]  Cardiac and Pulmonary Rehab  Picture Your Plate Total Score on Admission 77      Picture Your Plate Scores: <16 Unhealthy dietary pattern with much room for improvement. 41-50 Dietary pattern unlikely to meet recommendations for good health and room for improvement. 51-60 More healthful dietary pattern, with some room for improvement.  >60 Healthy dietary pattern, although there may be some specific behaviors that could be improved.    Nutrition Goals Re-Evaluation:  Nutrition Goals Re-Evaluation     Row Name 01/31/23 1436 02/28/23 1407           Goals   Nutrition Goal Review dietary information Short: Review heart healthy diet paperwork Long: Meet with dietitian      Comment [name] is off to a good start in rehab.  He feels that he has some knowledge  of a heart healthy diet, but he would like to meet with dietitian once they start.  In the mean time, he is trying to get in a good variety and monitor his sodium intake.  He was given a packet to review on heart healthy diet. [name] is doing well in rehab.  He is doing well on his diet and read through material.  He is still following Mediter[place] diet and eating more fruits and vegetables.   He feels confident with his diet currently.      Expected Outcome Short: Review heart healthy diet paperwork Long: Meet with dietitian Short: Set up appt with dietitian Long: Continue with adding in fruits and vegetables               Nutrition Goals Discharge (Final Nutrition Goals Re-Evaluation):  Nutrition Goals Re-Evaluation - 02/28/23 1407       Goals   Nutrition Goal Short: Review heart healthy diet paperwork Long: Meet with dietitian    Comment [name] is doing well in rehab.  He is doing well on his diet and read through material.  He is still following [place] diet and eating more fruits and vegetables.   He feels confident with his diet currently.    Expected Outcome Short: Set up appt with dietitian Long: Continue with adding in fruits and vegetables             Psychosocial: Target Goals: Acknowledge presence or absence of significant depression and/or stress, maximize coping skills, provide positive support system. Participant is able to verbalize types and ability to use techniques and skills needed for reducing stress and depression.   Education: Stress, Anxiety, and Depression - Group verbal and visual presentation to define topics covered.  Reviews how body is impacted by stress, anxiety, and depression.  Also discusses healthy ways to reduce stress and to treat/manage anxiety and depression.  Written material given at graduation. Flowsheet Row Cardiac Rehab from 02/23/2023 in [facility] Cardiac and Pulmonary Rehab  Date 02/16/23  Educator KW  Instruction Review Code 1- [employer]  Understanding       Education: Sleep Hygiene -Provides group verbal and written instruction about how sleep can affect your health.  Define sleep hygiene, discuss sleep cycles and impact of sleep habits. Review good sleep hygiene tips.    Initial Review & Psychosocial Screening:  Initial Psych Review & Screening - 01/04/23 1410       Initial Review   Current issues with Current Sleep Concerns;Current Stress Concerns    Source of Stress Concerns Unable to participate in former interests or hobbies      Family Dynamics   Good Support System? Yes  wife, mother, sister, extended family     Barriers   Psychosocial barriers to participate in program There are no identifiable barriers or psychosocial needs.;The patient should benefit from training in stress management and relaxation.      Screening Interventions   Interventions Encouraged to exercise;To provide support and resources with identified psychosocial needs;Provide feedback about the scores to participant    Expected Outcomes Short Term goal: Utilizing psychosocial counselor, staff and physician to assist with identification of specific Stressors or current issues interfering with healing process. Setting desired goal for each stressor or current issue identified.;Long Term Goal: Stressors or current issues are controlled or eliminated.;Short Term goal: Identification and review with participant of any Quality of Life or Depression concerns found by scoring the questionnaire.;Long Term goal: The participant improves quality of Life and PHQ9 Scores as seen by post scores and/or verbalization of changes             Quality of Life Scores:   Quality of Life - 01/19/23 1522       Quality of Life   Select Quality of Life      Quality of Life Scores   Health/Function Pre 16.77 %    Socioeconomic Pre 20.38 %    Psych/Spiritual Pre 20.57 %    Family Pre 17.4 %    GLOBAL Post 18.44 %            Scores of 19 and below  usually indicate a poorer quality of life in these areas.  A difference of  2-3 points is a clinically meaningful difference.  A difference of 2-3 points in the total score of the Quality of Life Index has been associated with significant improvement in overall quality of life, self-image, physical symptoms, and general health in studies assessing change in quality of life.  PHQ-9: Review Flowsheet  More data exists      02/28/2023 01/19/2023 01/10/2023 12/14/2022 12/31/2021  Depression screen PHQ 2/9  Decreased Interest 0 0 0 0 0  Down, Depressed, Hopeless 0 0 0 0 0  PHQ - 2 Score 0 0 0 0 0  Altered sleeping 2 3 0 1 0  Tired, decreased energy 1 2 - - 0  Change in appetite 0 0 0 0 0  Feeling bad or failure about yourself  0 0 0 1 0  Trouble concentrating 0 0 0 0 0  Moving slowly or fidgety/restless 0 1 0 0 0  Suicidal thoughts 0 0 0 0 0  PHQ-9 Score 3 6 0 2 0  Difficult doing work/chores Very difficult Somewhat difficult Not difficult at all Somewhat difficult -   Interpretation of Total Score  Total Score Depression Severity:  1-4 = Minimal depression, 5-9 = Mild depression, 10-14 = Moderate depression, 15-19 = Moderately severe depression, 20-27 = Severe depression   Psychosocial Evaluation and Intervention:  Psychosocial Evaluation - 01/04/23 1410       Psychosocial Evaluation & Interventions   Interventions Encouraged to exercise with the program and follow exercise prescription    Comments [name] is coming to 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 He has a history of spine and neck issues and he can't take his usual medication due to his newer cardiac meds. This is stressful for him and he is tired of being in pain. He has a history of sleep concerns, but his sleep habits have gotten worse since his MI. He wakes up around 3 and can't go back to sleep until around 7am. He states  his doctors can't give him a clear answer as to why. He did has his PCP about trying CBD oil for his peripheral  neuropathy and sleep and she cleared him to do so. He is hopeful the CBD and getting more active in the program will help him sleep better. He can't stand for a long period of time related to his spinal issues and is currently not working. He is hopeful the program will help him understand his cardiac health and develop an exercise routine that fits his needs.    Expected Outcomes Short: attend cardiac rehab for education and exercise. Long: develop and maintain positive self care habits.    Continue Psychosocial Services  Follow up required by staff             Psychosocial Re-Evaluation:  Psychosocial Re-Evaluation     Row Name 01/31/23 1415 02/28/23 1409           Psychosocial Re-Evaluation   Current issues with Current Stress Concerns;Current Depression Current Stress Concerns;Current Depression      Comments [name] is doing well mentally.  He is hoping to meet with therapist soon. Still not sleeping well and has not talked with anyone yet.  He got a name from his pain management clinic to see someone that is used to dealing with more chornic diseases. The CBD oil is helping with his neuropathy some.  He is eager to get started talking to someone to see if it would help. [name] is doing well in rehab.  He is still having sleep issues.  He did talk to his pyschiatrist about sleep and exhaustion.  They felt it may be linked to his Earlyne Iba so he is supposed to talk to his cardiologist about that.  He is still using the CBD olil to help with and pain and finds that it is helpful some.  His PHQ has improved since last month.  He is looking forward to getting back in the pool to swim and water walk.      Expected Outcomes Short: Call and get appointment set up Long: Conitnue to work on improving sleep and coping with stress. Short: Talk to doctor about sleep Long: Continue to exercise for mental boost      Interventions Encouraged to attend Cardiac Rehabilitation for the exercise;Stress  management education --      Continue Psychosocial Services  Follow up required by staff --               Psychosocial Discharge (Final Psychosocial Re-Evaluation):  Psychosocial Re-Evaluation - 02/28/23 1409       Psychosocial Re-Evaluation   Current issues with Current Stress Concerns;Current Depression    Comments [name] is doing well in rehab.  He is still having sleep issues.  He did talk to his pyschiatrist about sleep and exhaustion.  They felt it may be linked to his Earlyne Iba so he is supposed to talk to his cardiologist about that.  He is still using the CBD olil to help with and pain and finds that it is helpful some.  His PHQ has improved since last month.  He is looking forward to getting back in the pool to swim and water walk.    Expected Outcomes Short: Talk to doctor about sleep Long: Continue to exercise for mental boost             Vocational Rehabilitation: Provide vocational rehab assistance to qualifying candidates.   Vocational Rehab Evaluation & Intervention:  Vocational Rehab - 01/04/23  1410       Initial Vocational Rehab Evaluation & Intervention   Assessment shows need for Vocational Rehabilitation No             Education: Education Goals: Education classes will be provided on a variety of topics geared toward better understanding of heart health and risk factor modification. Participant will state understanding/return demonstration of topics presented as noted by education test scores.  Learning Barriers/Preferences:  Learning Barriers/Preferences - 01/04/23 1407       Learning Barriers/Preferences   Learning Barriers None    Learning Preferences None             General Cardiac Education Topics:  AED/CPR: - Group verbal and written instruction with the use of models to demonstrate the basic use of the AED with the basic ABC's of resuscitation.   Anatomy and Cardiac Procedures: - Group verbal and visual presentation and  models provide information about basic cardiac anatomy and function. Reviews the testing methods done to diagnose heart disease and the outcomes of the test results. Describes the treatment choices: Medical Management, Angioplasty, or Coronary Bypass Surgery for treating various heart conditions including Myocardial Infarction, Angina, Valve Disease, and Cardiac Arrhythmias.  Written material given at graduation.   Medication Safety: - Group verbal and visual instruction to review commonly prescribed medications for heart and lung disease. Reviews the medication, class of the drug, and side effects. Includes the steps to properly store meds and maintain the prescription regimen.  Written material given at graduation. Flowsheet Row Cardiac Rehab from 02/23/2023 in [facility] Cardiac and Pulmonary Rehab  Date 01/26/23  Educator SB  Instruction Review Code 1- Verbalizes Understanding       Intimacy: - Group verbal instruction through game format to discuss how heart and lung disease can affect sexual intimacy. Written material given at graduation..   Know Your Numbers and Heart Failure: - Group verbal and visual instruction to discuss disease risk factors for cardiac and pulmonary disease and treatment options.  Reviews associated critical values for Overweight/Obesity, Hypertension, Cholesterol, and Diabetes.  Discusses basics of heart failure: signs/symptoms and treatments.  Introduces Heart Failure Zone chart for action plan for heart failure.  Written material given at graduation. Flowsheet Row Cardiac Rehab from 02/23/2023 in [facility] Cardiac and Pulmonary Rehab  Date 02/02/23  Educator MS  Instruction Review Code 1- Verbalizes Understanding       Infection Prevention: - Provides verbal and written material to individual with discussion of infection control including proper hand washing and proper equipment cleaning during exercise session. Flowsheet Row Cardiac Rehab from 02/23/2023 in [facility]  Cardiac and Pulmonary Rehab  Date 01/19/23  Educator NT  Instruction Review Code 1- Verbalizes Understanding       Falls Prevention: - Provides verbal and written material to individual with discussion of falls prevention and safety. Flowsheet Row Cardiac Rehab from 02/23/2023 in [facility] Cardiac and Pulmonary Rehab  Date 01/19/23  Educator NT  Instruction Review Code 1- Verbalizes Understanding       Other: -Provides group and verbal instruction on various topics (see comments)   Knowledge Questionnaire Score:  Knowledge Questionnaire Score - 01/19/23 1523       Knowledge Questionnaire Score   Pre Score 24/26             Core Components/Risk Factors/Patient Goals at Admission:  Personal Goals and Risk Factors at Admission - 01/19/23 1523       Core Components/Risk Factors/Patient Goals on Admission    Weight  Management Yes;Weight Loss    Intervention Weight Management: Provide education and appropriate resources to help participant work on and attain dietary goals.;Weight Management: Develop a combined nutrition and exercise program designed to reach desired caloric intake, while maintaining appropriate intake of nutrient and fiber, sodium and fats, and appropriate energy expenditure required for the weight goal.;Weight Management/Obesity: Establish reasonable short term and long term weight goals.    Admit Weight 163 lb 12.8 oz (74.3 kg)    Goal Weight: Short Term 158 lb (71.7 kg)    Goal Weight: Long Term 153 lb (69.4 kg)    Expected Outcomes Long Term: Adherence to nutrition and physical activity/exercise program aimed toward attainment of established weight goal;Short Term: Continue to assess and modify interventions until short term weight is achieved;Weight Loss: Understanding of general recommendations for a balanced deficit meal plan, which promotes 1-2 lb weight loss per week and includes a negative energy balance of [phone] kcal/d;Understanding recommendations for  meals to include 15-35% energy as protein, 25-35% energy from fat, 35-60% energy from carbohydrates, less than 200mg  of dietary cholesterol, 20-35 gm of total fiber daily;Understanding of distribution of calorie intake throughout the day with the consumption of 4-5 meals/snacks    Hypertension Yes    Intervention Provide education on lifestyle modifcations including regular physical activity/exercise, weight management, moderate sodium restriction and increased consumption of fresh fruit, vegetables, and low fat dairy, alcohol moderation, and smoking cessation.;Monitor prescription use compliance.    Expected Outcomes Short Term: Continued assessment and intervention until BP is < 140/47mm HG in hypertensive participants. < 130/1mm HG in hypertensive participants with diabetes, heart failure or chronic kidney disease.;Long Term: Maintenance of blood pressure at goal levels.    Lipids Yes    Intervention Provide education and support for participant on nutrition & aerobic/resistive exercise along with prescribed medications to achieve LDL 70mg , HDL >40mg .    Expected Outcomes Short Term: Participant states understanding of desired cholesterol values and is compliant with medications prescribed. Participant is following exercise prescription and nutrition guidelines.;Long Term: Cholesterol controlled with medications as prescribed, with individualized exercise RX and with personalized nutrition plan. Value goals: LDL < 70mg , HDL > 40 mg.             Education:Diabetes - Individual verbal and written instruction to review signs/symptoms of diabetes, desired ranges of glucose level fasting, after meals and with exercise. Acknowledge that pre and post exercise glucose checks will be done for 3 sessions at entry of program.   Core Components/Risk Factors/Patient Goals Review:   Goals and Risk Factor Review     Row Name 01/31/23 1417 02/28/23 1407           Core Components/Risk Factors/Patient  Goals Review   Personal Goals Review Weight Management/Obesity;Hypertension Weight Management/Obesity;Hypertension      Review [name] is off to a good start in rehab.  He would like to work on losing weight and has already started on that trend.  His wife is helping to keep an eye on his pressures and they check in on occassion at home. [name] is doing well in rehab. His pressures are doing well and his weight is up a little recently but steady for most part.  He is pleased with his progress.      Expected Outcomes Short: Continue to work on weight loss Long: Continue to monitor risk factors Short; COnitnue to work on [employer] maintenance Long: Continue to monitor risk factors  Core Components/Risk Factors/Patient Goals at Discharge (Final Review):   Goals and Risk Factor Review - 02/28/23 1407       Core Components/Risk Factors/Patient Goals Review   Personal Goals Review Weight Management/Obesity;Hypertension    Review [name] is doing well in rehab. His pressures are doing well and his weight is up a little recently but steady for most part.  He is pleased with his progress.    Expected Outcomes Short; COnitnue to work on weight maintenance Long: Continue to monitor risk factors             ITP Comments:  ITP Comments     Row Name 01/04/23 1423 01/19/23 1520 01/24/23 1345 02/09/23 0920 03/09/23 1113   ITP Comments Initial phone call completed. Diagnosis [facility] be found in [facility]. EP Orientation scheduled for Wednesday 4/24 at 1:30pm. Completed and gym orientation. Initial ITP created and sent for review to Dr. Bethann Punches, Medical Director. First full day of exercise!  Patient was oriented to gym and equipment including functions, settings, policies, and procedures.  [name] individual exercise prescription and treatment plan were reviewed.  All starting workloads were established based on the results of the 6 minute walk test done at initial orientation visit.  The plan  for exercise progression was also introduced and progression will be customized based on patient's performance and goals. 30 Day review completed. Medical Director ITP review done, changes made as directed, and signed approval by Medical Director.   new to program 30 Day review completed. Medical Director ITP review done, changes made as directed, and signed approval by Medical Director.            Comments:

## 2023-03-12 ENCOUNTER — Encounter: Payer: Self-pay | Admitting: Family Medicine

## 2023-03-14 ENCOUNTER — Encounter: Payer: PPO | Admitting: *Deleted

## 2023-03-14 DIAGNOSIS — I214 Non-ST elevation (NSTEMI) myocardial infarction: Secondary | ICD-10-CM

## 2023-03-14 DIAGNOSIS — Z955 Presence of coronary angioplasty implant and graft: Secondary | ICD-10-CM

## 2023-03-14 NOTE — Progress Notes (Signed)
Daily Session Note  Patient Details  Name: [name] MRN: [id number] Date of Birth: [birth year] Referring Provider:   Flowsheet Row Cardiac Rehab from 01/19/2023 in [facility] Cardiac and Pulmonary Rehab  Referring Provider Dr. Bryan Lemma, MD       Encounter Date: 03/14/2023  Check In:  Session Check In - 03/14/23 1339       Check-In   Supervising physician immediately available to respond to emergencies See telemetry face sheet for immediately available ER MD    Location [facility]-Cardiac & Pulmonary Rehab    Staff Present Susann Givens, RN BSN;Joseph Reino Kent, Guinevere Ferrari, RN, [place]    Virtual Visit No    Medication changes reported     No    Fall or balance concerns reported    No    Warm-up and Cool-down Performed on first and last piece of equipment    Resistance Training Performed Yes    VAD Patient? No    PAD/SET Patient? No      Pain Assessment   Currently in Pain? No/denies                Social History   Tobacco Use  Smoking Status Former  Smokeless Tobacco Never  Tobacco Comments   quit 35 years    Goals Met:  Independence with exercise equipment Exercise tolerated well No report of concerns or symptoms today Strength training completed today  Goals Unmet:  Not Applicable  Comments: Pt able to follow exercise prescription today without complaint.  Will continue to monitor for progression.    Dr. Bethann Punches is Medical Director for [facility] Cardiac Rehabilitation.  Dr. Vida Rigger is Medical Director for [facility] Pulmonary Rehabilitation.

## 2023-03-15 DIAGNOSIS — M25512 Pain in left shoulder: Secondary | ICD-10-CM | POA: Diagnosis not present

## 2023-03-15 DIAGNOSIS — G894 Chronic pain syndrome: Secondary | ICD-10-CM | POA: Diagnosis not present

## 2023-03-15 DIAGNOSIS — G5752 Tarsal tunnel syndrome, left lower limb: Secondary | ICD-10-CM | POA: Diagnosis not present

## 2023-03-15 DIAGNOSIS — M5412 Radiculopathy, cervical region: Secondary | ICD-10-CM | POA: Diagnosis not present

## 2023-03-16 MED ORDER — SCOPOLAMINE 1 MG/3DAYS TD PT72: 1.0000 | MEDICATED_PATCH | TRANSDERMAL | 0 refills | Status: AC

## 2023-03-16 NOTE — Telephone Encounter (Signed)
Done

## 2023-03-28 ENCOUNTER — Encounter: Payer: PPO | Attending: Cardiology | Admitting: *Deleted

## 2023-03-28 DIAGNOSIS — I214 Non-ST elevation (NSTEMI) myocardial infarction: Secondary | ICD-10-CM | POA: Diagnosis not present

## 2023-03-28 DIAGNOSIS — Z955 Presence of coronary angioplasty implant and graft: Secondary | ICD-10-CM | POA: Insufficient documentation

## 2023-03-28 NOTE — Progress Notes (Signed)
Daily Session Note  Patient Details  Name: [name] MRN: [id number] Date of Birth: 03-20-1961 Referring Provider:   Flowsheet Row Cardiac Rehab from 01/19/2023 in [facility] Cardiac and Pulmonary Rehab  Referring Provider Dr. Bryan Lemma, MD       Encounter Date: 03/28/2023  Check In:  Session Check In - 03/28/23 1333       Check-In   Supervising physician immediately available to respond to emergencies See telemetry face sheet for immediately available ER MD    Location [facility]-Cardiac & Pulmonary Rehab    Staff Present Susann Givens, RN BSN;Joseph Winnfield, RCP,RRT,BSRT;Kelly Savoy, BS, ACSM CEP, Exercise Physiologist;Megan Katrinka Blazing, RN, ADN    Virtual Visit No    Medication changes reported     No    Fall or balance concerns reported    No    Warm-up and Cool-down Performed on first and last piece of equipment    Resistance Training Performed Yes    VAD Patient? No    PAD/SET Patient? No      Pain Assessment   Currently in Pain? No/denies                Social History   Tobacco Use  Smoking Status Former  Smokeless Tobacco Never  Tobacco Comments   quit 35 years    Goals Met:  Independence with exercise equipment Exercise tolerated well No report of concerns or symptoms today Strength training completed today  Goals Unmet:  Not Applicable  Comments: Pt able to follow exercise prescription today without complaint.  Will continue to monitor for progression.    Dr. Bethann Punches is Medical Director for [facility] Cardiac Rehabilitation.  Dr. Vida Rigger is Medical Director for [facility] Pulmonary Rehabilitation.

## 2023-03-30 ENCOUNTER — Telehealth: Payer: Self-pay | Admitting: Cardiology

## 2023-03-30 MED ORDER — METOPROLOL SUCCINATE ER 25 MG PO TB24: 12.5000 mg | ORAL_TABLET | Freq: Every day | ORAL | 0 refills | Status: DC

## 2023-03-30 NOTE — Telephone Encounter (Signed)
Lets wean off Metoprolol - 1/2 tab at bedtime x 1 week then off.   DH

## 2023-03-30 NOTE — Telephone Encounter (Signed)
Pt c/o Shortness Of Breath: STAT if SOB developed within the last 24 hours or pt is noticeably SOB on the phone  1. Are you currently SOB (can you hear that pt is SOB on the phone)? No   2. How long have you been experiencing SOB? Since having heart attack on 3/15  3. Are you SOB when sitting or when up moving around? Moving around  4. Are you currently experiencing any other symptoms? Balance is off

## 2023-03-30 NOTE — Telephone Encounter (Signed)
Called spoke to patient. Patient verbalized understanding-- to wean metoprolol succinate 12.5 mg  at bedtime for 7 days the stop

## 2023-03-30 NOTE — Telephone Encounter (Signed)
Lets wean off Metoprolol - 1/2 tab at bedtime x 1 week then off.   DH   

## 2023-03-30 NOTE — Telephone Encounter (Signed)
Patient states SOB since March. Patient states it  is with exertion.  Sees especially with cardio rehab.  States the effort is not as much as the effect he has from the work. Wakes up in the morning "not feeling right".  States feels weir and off kilter.  He takes ASA and Brilinta and the effects of that have resolved.  He states he is off balance.  Tripped over something three times, not falling but tripping.  States "normal walk is off".   These are same as his visit in April with no change better or worse.   States he has been taking the metoprolol  after dinner and amlodipine at night with Lipitor at night. OV states  "I think maybe the metoprolol could be playing a role. Will switch metoprolol to nighttime and also switched amlodipine to morning.  He will start the amlodipine this morning.    States he is seeing pain management and is on lactulose for constipation and that is normal.   Ask uif he should take the Mag Ox or a vitamin supplement.  Also ask for any further recommendations to help with how he feels?  Or if would like OV please let me know.

## 2023-04-03 ENCOUNTER — Encounter: Payer: Self-pay | Admitting: Cardiology

## 2023-04-04 ENCOUNTER — Encounter: Payer: PPO | Admitting: *Deleted

## 2023-04-04 DIAGNOSIS — I214 Non-ST elevation (NSTEMI) myocardial infarction: Secondary | ICD-10-CM | POA: Diagnosis not present

## 2023-04-04 DIAGNOSIS — Z955 Presence of coronary angioplasty implant and graft: Secondary | ICD-10-CM

## 2023-04-04 NOTE — Progress Notes (Signed)
Daily Session Note  Patient Details  Name: [name] MRN: [id number] Date of Birth: May 17, 1961 Referring Provider:   Flowsheet Row Cardiac Rehab from 01/19/2023 in [facility]iac [facility]  Referring Provider Dr. Bryan Lemma, MD       Encounter Date: 04/04/2023  Check In:  Session Check In - 04/04/23 1517       Check-In   Supervising physician immediately available to respond to emergencies See telemetry face sheet for immediately available ER MD    Location [facility]-Cardiac & Pulmonary Rehab    Staff Present Cora Collum, RN, BSN, CCRP;Meredith Jewel Baize, RN Mabeline Caras, BS, ACSM CEP, Exercise Physiologist;Laureen Manson Passey, BS, RRT, CPFT    Virtual Visit No    Medication changes reported     No    Fall or balance concerns reported    No    Warm-up and Cool-down Performed on first and last piece of equipment    Resistance Training Performed Yes    VAD Patient? No    PAD/SET Patient? No      Pain Assessment   Currently in Pain? No/denies                Social History   Tobacco Use  Smoking Status Former  Smokeless Tobacco Never  Tobacco Comments   quit 35 years    Goals Met:  Independence with exercise equipment Exercise tolerated well No report of concerns or symptoms today  Goals Unmet:  Not Applicable  Comments: Pt able to follow exercise prescription today without complaint.  Will continue to monitor for progression.    Dr. Bethann Punches is Medical Director for [facility] Cardiac Rehabilitation.  Dr. Vida Rigger is Medical Director for [facility] Pulmonary Rehabilitation.

## 2023-04-05 ENCOUNTER — Encounter: Payer: Self-pay | Admitting: *Deleted

## 2023-04-05 DIAGNOSIS — I214 Non-ST elevation (NSTEMI) myocardial infarction: Secondary | ICD-10-CM

## 2023-04-05 DIAGNOSIS — Z955 Presence of coronary angioplasty implant and graft: Secondary | ICD-10-CM

## 2023-04-05 NOTE — Progress Notes (Signed)
Cardiac Individual Treatment Plan  Patient Details  Name: RANGER MIDURA MRN: [id number] Date of Birth: [birth year] Referring Provider:   Flowsheet Row Cardiac Rehab from 01/19/2023 in [facility] Cardiac and Pulmonary Rehab  Referring Provider Dr. Bryan Lemma, MD       Initial Encounter Date:  Flowsheet Row Cardiac Rehab from 01/19/2023 in [facility] Cardiac and Pulmonary Rehab  Date 01/19/23       Visit Diagnosis: NSTEMI (non-ST elevated myocardial infarction) [facility])  Status post coronary artery stent placement  [name] Home Medications on Admission:  Current Outpatient Medications:    acyclovir (ZOVIRAX) 800 MG tablet, Take 1 tablet (800 mg total) by mouth 2 (two) times daily. (Patient not taking: Reported on 01/17/2023), Disp: 180 tablet, Rfl: 3   amLODipine (NORVASC) 10 MG tablet, TAKE 1 TABLET BY MOUTH DAILY (Patient taking differently: Take 10 mg by mouth every evening.), Disp: 90 tablet, Rfl: 3   aspirin EC 81 MG tablet, Take 1 tablet (81 mg total) by mouth daily. Swallow whole., Disp: 30 tablet, Rfl: 12   atorvastatin (LIPITOR) 80 MG tablet, Take 1 tablet (80 mg total) by mouth daily., Disp: 90 tablet, Rfl: 2   baclofen (LIORESAL) 20 MG tablet, Take 1 tablet (20 mg total) by mouth every 8 (eight) hours as needed for muscle spasms., Disp: 60 each, Rfl: 5   cetirizine (ZYRTEC) 10 MG tablet, Take 10 mg by mouth daily. (Patient not taking: Reported on 01/17/2023), Disp: , Rfl:    diazepam (VALIUM) 10 MG tablet, Take 10 mg by mouth 2 (two) times daily., Disp: , Rfl:    diclofenac sodium (VOLTAREN) 1 % GEL, Apply 1 application  topically daily as needed (For pain). (Patient not taking: Reported on 01/17/2023), Disp: , Rfl:    EPINEPHrine 0.3 mg/0.3 mL IJ SOAJ injection, USE AS DIRECTED (Patient not taking: Reported on 01/17/2023), Disp: 2 each, Rfl: 1   fentaNYL (DURAGESIC - DOSED MCG/HR) 50 MCG/HR, Place 50 mcg onto the skin every other day., Disp: , Rfl:    fluticasone (FLONASE) 50  MCG/ACT nasal spray, USE TWO SPRAYS IN EACH NOSTRIL DAILY (Patient not taking: Reported on 01/17/2023), Disp: 16 g, Rfl: 11   lactulose, encephalopathy, (CHRONULAC) 10 GM/15ML SOLN, Take 20 g by mouth at bedtime., Disp: , Rfl:    metoprolol succinate (TOPROL-XL) 25 MG 24 hr tablet, Take 0.5 tablets (12.5 mg total) by mouth at bedtime for 7 days. Then stop taking medication, Disp: , Rfl: 0   nitroGLYCERIN (NITROSTAT) 0.4 MG SL tablet, Place 1 tablet (0.4 mg total) under the tongue every 5 (five) minutes as needed for chest pain., Disp: 100 tablet, Rfl: 3   oxyCODONE (OXY IR/ROXICODONE) 5 MG immediate release tablet, Take 5 mg by mouth every 6 (six) hours as needed for moderate pain. (Patient not taking: Reported on 01/17/2023), Disp: , Rfl: 0   promethazine (PHENERGAN) 25 MG tablet, Take 12.5 mg by mouth every 6 (six) hours as needed for nausea or vomiting., Disp: , Rfl:    scopolamine (TRANSDERM SCOP, 1.5 MG,) 1 MG/3DAYS, Place 1 patch (1.5 mg total) onto the skin every 3 (three) days., Disp: 10 patch, Rfl: 0   ticagrelor (BRILINTA) 90 MG TABS tablet, Take 1 tablet (90 mg total) by mouth 2 (two) times daily., Disp: 60 tablet, Rfl: 11   traZODone (DESYREL) 100 MG tablet, Take 50 mg by mouth at bedtime., Disp: , Rfl:    triamcinolone cream (KENALOG) 0.1 %, Apply 1 application topically 2 (two) times daily. (Patient not  taking: Reported on 01/17/2023), Disp: 30 g, Rfl: 0  Past Medical History: Past Medical History:  Diagnosis Date   ADHD (attention deficit hyperactivity disorder), inattentive type    sees Dr. Deboraha Sprang in [place], [place]    Anxiety    sees Dr. Deboraha Sprang in [place], [place]    Arthritis    Benign enlargement of prostate    sees [facility]    CAD- 1 Vessel, DES PCI LCx. (NSTEMI) 12/10/2022   LHC 12/10/2022:  Minimal LM & LAD disease. Mid Cx 99% (subtotal occlusion) =>  PCI using a 2.25 x 24 mm Synergy DES; pRCA 30% & mRCA 50%.      Carpal tunnel syndrome on left    sees Dr.  Kathie Dike at [facility]    Chronic pain    sees Dr. Thyra Breed (meds) and Altamease Oiler (accupuncture)    Depression    sees Dr. Deboraha Sprang   Functional impotence    Hyperlipidemia with target LDL less than 70 07/09/2014   Hypogonadism in male    sees Carollee Herter (a [place]) at [facility]    Insomnia    Kidney stones    NSTEMI (non-ST elevated myocardial infarction) (HCC) 12/10/2022   Admitted with stuttering chest pain, troponin elevation. => 9 9% LCx=> DES stent.  Normal EF on echo.   Sinusitis, acute 07/17/2016   Urticaria due to cold     Tobacco Use: Social History   Tobacco Use  Smoking Status Former  Smokeless Tobacco Never  Tobacco Comments   quit 35 years    Labs: Review Flowsheet  More data exists      Latest Ref Rng & Units 04/03/2019 06/05/2020 11/09/2021 12/10/2022 12/11/2022  Labs for ITP Cardiac and Pulmonary Rehab  Cholestrol 0 - 200 mg/dL 161  096  045  409  -  LDL (calc) 0 - 99 mg/dL 811  914  782  956  -  HDL-C >40 mg/dL 21.30  58  86.57  61  -  Trlycerides <150 mg/dL 846.9  629  528.4  71  -  Hemoglobin A1c 4.8 - 5.6 % - 5.8  6.0  - 5.9      Exercise Target Goals: Exercise Program Goal: Individual exercise prescription set using results from initial 6 min walk test and THRR while considering  patient's activity barriers and safety.   Exercise Prescription Goal: Initial exercise prescription builds to 30-45 minutes a day of aerobic activity, 2-3 days per week.  Home exercise guidelines will be given to patient during program as part of exercise prescription that the participant will acknowledge.   Education: Aerobic Exercise: - Group verbal and visual presentation on the components of exercise prescription. Introduces F.I.T.T principle from ACSM for exercise prescriptions.  Reviews F.I.T.T. principles of aerobic exercise including progression. Written material given at graduation. Flowsheet Row Cardiac Rehab from 02/23/2023 in [facility] Cardiac and  Pulmonary Rehab  Education need identified 01/19/23       Education: Resistance Exercise: - Group verbal and visual presentation on the components of exercise prescription. Introduces F.I.T.T principle from ACSM for exercise prescriptions  Reviews F.I.T.T. principles of resistance exercise including progression. Written material given at graduation.    Education: Exercise & Equipment Safety: - Individual verbal instruction and demonstration of equipment use and safety with use of the equipment. Flowsheet Row Cardiac Rehab from 02/23/2023 in [facility] Cardiac and Pulmonary Rehab  Date 01/19/23  Educator NT  Instruction Review Code 1- Verbalizes Understanding  Education: Exercise Physiology & General Exercise Guidelines: - Group verbal and written instruction with models to review the exercise physiology of the cardiovascular system and associated critical values. Provides general exercise guidelines with specific guidelines to those with heart or lung disease.  Flowsheet Row Cardiac Rehab from 02/23/2023 in [facility] Cardiac and Pulmonary Rehab  Date 02/23/23  Educator [facility]  Instruction Review Code 1- Verbalizes Understanding       Education: Flexibility, Balance, Mind/Body Relaxation: - Group verbal and visual presentation with interactive activity on the components of exercise prescription. Introduces F.I.T.T principle from ACSM for exercise prescriptions. Reviews F.I.T.T. principles of flexibility and balance exercise training including progression. Also discusses the mind body connection.  Reviews various relaxation techniques to help reduce and manage stress (i.e. Deep breathing, progressive muscle relaxation, and visualization). Balance handout provided to take home. Written material given at graduation.   Activity Barriers & Risk Stratification:  Activity Barriers & Cardiac Risk Stratification - 01/19/23 1536       Activity Barriers & Cardiac Risk Stratification   Activity  Barriers Back Problems;Shortness of Breath;Neck/Spine Problems;Other (comment)    Comments Torn rotator cuff last year, neuropathy causes pain in feet    Cardiac Risk Stratification Moderate             6 Minute Walk:  6 Minute Walk     Row Name 01/19/23 1525         6 Minute Walk   Phase Initial     Distance 1750 feet     Walk Time 6 minutes     # of Rest Breaks 0     MPH 3.31     METS 4.52     RPE 11     Perceived Dyspnea  1     VO2 Peak 15.83     Symptoms Yes (comment)     Comments Neck Pain 3/10, Feet Pain 4/10     Resting HR 78 bpm     Resting BP 120/78     Resting Oxygen Saturation  97 %     Exercise Oxygen Saturation  during 6 min walk 95 %     Max Ex. HR 107 bpm     Max Ex. BP 134/74     2 Minute Post BP 112/68              Oxygen Initial Assessment:   Oxygen Re-Evaluation:   Oxygen Discharge (Final Oxygen Re-Evaluation):   Initial Exercise Prescription:  Initial Exercise Prescription - 01/19/23 1500       Date of Initial Exercise RX and Referring Provider   Date 01/19/23    Referring Provider Dr. Bryan Lemma, MD      Oxygen   Maintain Oxygen Saturation 88% or higher      Treadmill   MPH 2.8    Grade 1    Minutes 15    METs 3.53      Recumbant Bike   Level 3    RPM 50    Watts 59    Minutes 15    METs 4.52      NuStep   Level 4    SPM 80    Minutes 15    METs 4.52      REL-XR   Level 2    Speed 50    Minutes 15    METs 4.52      Prescription Details   Frequency (times per week) 2    Duration Progress to 30 minutes  of continuous aerobic without signs/symptoms of physical distress      Intensity   THRR 40-80% of Max Heartrate 110-143    Ratings of Perceived Exertion 11-13    Perceived Dyspnea 0-4      Progression   Progression Continue to progress workloads to maintain intensity without signs/symptoms of physical distress.      Resistance Training   Training Prescription Yes    Weight 3 lb, 5 lb    Reps  10-15             Perform Capillary Blood Glucose checks as needed.  Exercise Prescription Changes:   Exercise Prescription Changes     Row Name 01/19/23 1500 01/26/23 1600 01/31/23 1400 02/07/23 1400 02/22/23 0800     Response to Exercise   Blood Pressure (Admit) 120/78 122/64 -- 124/72 116/62   Blood Pressure (Exercise) 134/74 138/62 -- 140/68 112/60   Blood Pressure (Exit) 112/68 102/64 -- 120/78 110/60   Heart Rate (Admit) 78 bpm 75 bpm -- 70 bpm 93 bpm   Heart Rate (Exercise) 107 bpm 97 bpm -- 123 bpm 114 bpm   Heart Rate (Exit) 81 bpm 74 bpm -- 79 bpm 100 bpm   Oxygen Saturation (Admit) 97 % -- -- -- --   Oxygen Saturation (Exercise) 95 % -- -- -- --   Oxygen Saturation (Exit) 97 % -- -- -- --   Rating of Perceived Exertion (Exercise) 11 15 -- 15 13   Perceived Dyspnea (Exercise) 1 -- -- -- --   Symptoms Neck pain 3/10, Feet Pain 4/10 none -- none none   Comments Results First full day of exercise -- -- --   Duration -- Continue with 30 min of aerobic exercise without signs/symptoms of physical distress. -- Continue with 30 min of aerobic exercise without signs/symptoms of physical distress. Continue with 30 min of aerobic exercise without signs/symptoms of physical distress.   Intensity -- THRR unchanged -- THRR unchanged THRR unchanged     Progression   Progression -- Continue to progress workloads to maintain intensity without signs/symptoms of physical distress. -- Continue to progress workloads to maintain intensity without signs/symptoms of physical distress. Continue to progress workloads to maintain intensity without signs/symptoms of physical distress.   Average METs -- 2.99 -- 3.26 3.38     Resistance Training   Training Prescription -- Yes -- Yes Yes   Weight -- 3 lb, 5 lb -- 3 lb, 5 lb 3 lb   Reps -- 10-15 -- 10-15 10-15     Interval Training   Interval Training -- No -- No No     Treadmill   MPH -- 2.2 -- 2.2 --   Grade -- 1 -- 1 --   Minutes --  15 -- 15 --   METs -- 2.99 -- 2.99 --     Recumbant Bike   Level -- -- -- 4 4   Watts -- -- -- 30 32   Minutes -- -- -- 15 15   METs -- -- -- -- 2.62     REL-XR   Level -- 2 -- 3 3   Minutes -- 15 -- 15 15   METs -- -- -- 3.6 3.6     Track   Laps -- -- -- 52 42   Minutes -- -- -- 15 15   METs -- -- -- 3.83 --     Home Exercise Plan   Plans to continue exercise at -- -- Home (comment)  walking, elliptical Home (comment)  walking, elliptical Home (comment)  walking, elliptical   Frequency -- -- Add 3 additional days to program exercise sessions. Add 3 additional days to program exercise sessions. Add 3 additional days to program exercise sessions.   Initial Home Exercises Provided -- -- 01/31/23 01/31/23 01/31/23     Oxygen   Maintain Oxygen Saturation -- 88% or higher -- 88% or higher 88% or higher    Row Name 03/07/23 1600 03/23/23 1100 04/04/23 1600         Response to Exercise   Blood Pressure (Admit) 102/60 100/62 118/60     Blood Pressure (Exercise) 130/80 130/80 --     Blood Pressure (Exit) 110/60 110/64 120/68     Heart Rate (Admit) 83 bpm 86 bpm 82 bpm     Heart Rate (Exercise) 111 bpm 107 bpm 106 bpm     Heart Rate (Exit) 89 bpm 85 bpm 85 bpm     Rating of Perceived Exertion (Exercise) 13 12 11      Symptoms none none none     Duration Continue with 30 min of aerobic exercise without signs/symptoms of physical distress. Continue with 30 min of aerobic exercise without signs/symptoms of physical distress. Continue with 30 min of aerobic exercise without signs/symptoms of physical distress.     Intensity THRR unchanged THRR unchanged THRR unchanged       Progression   Progression Continue to progress workloads to maintain intensity without signs/symptoms of physical distress. Continue to progress workloads to maintain intensity without signs/symptoms of physical distress. Continue to progress workloads to maintain intensity without signs/symptoms of physical distress.      Average METs 3.56 3.3 3.97       Resistance Training   Training Prescription Yes Yes Yes     Weight 3 lb 3 lb 3 lb     Reps 10-15 10-15 10-15       Interval Training   Interval Training No No No       Recumbant Bike   Level 5 5 --     Watts 34 34 --     Minutes 15 15 --     METs 3.47 -- --       REL-XR   Level 4 4 4      Minutes 15 15 15      METs 4.3 3.2 3.34       Track   Laps 44 43 43     Minutes 15 15 15      METs 3.39 3.34 3.34       Home Exercise Plan   Plans to continue exercise at Home (comment)  walking, elliptical Home (comment)  walking, elliptical Home (comment)  walking, elliptical     Frequency Add 3 additional days to program exercise sessions. Add 3 additional days to program exercise sessions. Add 3 additional days to program exercise sessions.     Initial Home Exercises Provided 01/31/23 01/31/23 01/31/23       Oxygen   Maintain Oxygen Saturation 88% or higher 88% or higher 88% or higher              Exercise Comments:   Exercise Comments     Row Name 01/24/23 1345           Exercise Comments First full day of exercise!  Patient was oriented to gym and equipment including functions, settings, policies, and procedures.  [name] individual exercise prescription and treatment plan were reviewed.  All starting workloads were  established based on the results of the 6 minute walk test done at initial orientation visit.  The plan for exercise progression was also introduced and progression will be customized based on patient's performance and goals.                Exercise Goals and Review:   Exercise Goals     Row Name 01/19/23 1540             Exercise Goals   Increase Physical Activity Yes       Intervention Provide advice, education, support and counseling about physical activity/exercise needs.;Develop an individualized exercise prescription for aerobic and resistive training based on initial evaluation findings, risk  stratification, comorbidities and participant's personal goals.       Expected Outcomes Short Term: Attend rehab on a regular basis to increase amount of physical activity.;Long Term: Add in home exercise to make exercise part of routine and to increase amount of physical activity.;Long Term: Exercising regularly at least 3-5 days a week.       Increase Strength and Stamina Yes       Intervention Provide advice, education, support and counseling about physical activity/exercise needs.;Develop an individualized exercise prescription for aerobic and resistive training based on initial evaluation findings, risk stratification, comorbidities and participant's personal goals.       Expected Outcomes Short Term: Increase workloads from initial exercise prescription for resistance, speed, and METs.;Short Term: Perform resistance training exercises routinely during rehab and add in resistance training at home;Long Term: Improve cardiorespiratory fitness, muscular endurance and strength as measured by increased METs and functional capacity ( )       Able to understand and use rate of perceived exertion (RPE) scale Yes       Intervention Provide education and explanation on how to use RPE scale       Expected Outcomes Short Term: Able to use RPE daily in rehab to express subjective intensity level;Long Term:  Able to use RPE to guide intensity level when exercising independently       Able to understand and use Dyspnea scale Yes       Intervention Provide education and explanation on how to use Dyspnea scale       Expected Outcomes Short Term: Able to use Dyspnea scale daily in rehab to express subjective sense of shortness of breath during exertion;Long Term: Able to use Dyspnea scale to guide intensity level when exercising independently       Knowledge and understanding of Target Heart Rate Range (THRR) Yes       Intervention Provide education and explanation of THRR including how the numbers were predicted  and where they are located for reference       Expected Outcomes Long Term: Able to use THRR to govern intensity when exercising independently;Short Term: Able to state/look up THRR;Short Term: Able to use daily as guideline for intensity in rehab       Able to check pulse independently Yes       Intervention Provide education and demonstration on how to check pulse in carotid and radial arteries.;Review the importance of being able to check your own pulse for safety during independent exercise       Expected Outcomes Short Term: Able to explain why pulse checking is important during independent exercise;Long Term: Able to check pulse independently and accurately       Understanding of Exercise Prescription Yes       Intervention Provide education, explanation, and written materials  on patient's individual exercise prescription       Expected Outcomes Short Term: Able to explain program exercise prescription;Long Term: Able to explain home exercise prescription to exercise independently                Exercise Goals Re-Evaluation :  Exercise Goals Re-Evaluation     Row Name 01/24/23 1345 01/26/23 1625 01/31/23 1441 02/07/23 1433 02/22/23 0825     Exercise Goal Re-Evaluation   Exercise Goals Review Increase Physical Activity;Able to understand and use rate of perceived exertion (RPE) scale;Knowledge and understanding of Target Heart Rate Range (THRR);Understanding of Exercise Prescription;Increase Strength and Stamina;Able to check pulse independently Increase Physical Activity;Increase Strength and Stamina;Understanding of Exercise Prescription Increase Physical Activity;Increase Strength and Stamina;Understanding of Exercise Prescription Increase Physical Activity;Increase Strength and Stamina;Understanding of Exercise Prescription Increase Physical Activity;Increase Strength and Stamina;Understanding of Exercise Prescription   Comments Reviewed RPE scale, THR and program prescription with pt  today.  Pt voiced understanding and was given a copy of goals to take home. [name] is off to a good start in the program. During his first day of exercise he was able to walk on the treadmill at a speed of 2.2 mph with a 1% incline. He also did well on the XR at level 2. He tolerated using a 3 lb and a 5 lb hand weight for resistance training as well. We will continue to monitor his progress in the program [name] is already exercising at home on his off days. Reviewed home exercise with pt today.  Pt plans to walk and use elliptical at home for exercise.  Reviewed THR, pulse, RPE, sign and symptoms, pulse oximetery and when to call 911 or MD.  Also discussed weather considerations and indoor options.  Pt voiced understanding. [name] is doing well in the program. He is now walking up to 52 laps on the track and increased his overall average MET level to 3.26 METs. He also improved to level 4 on the recumbent bike and level 3 on the XR. He has continued to use 3 lb hand weights for resistance training as well. We will continue to monitor his progress in the program. [name] is doing well in rehab.  He is up to 32 watts on the bike and consistently walking a mile in his time on the track.  We will encourage him to try heavier weights as well.  We will continue to monitor his progress.   Expected Outcomes Short: Use RPE daily to regulate intensity.  Long: Follow program prescription in THR. Short: Continue to follow current exercise prescription. Long: Continue to increase MET level and stamina. Short: Continue to exercise at home on off days Long: Continue to follow program prescription Short: Try 4 lb hand weights for resistance training, Long: Conitnue to improve strength and stamina. Short: Increase hand weights Long: Continue to improve stamina    Row Name 02/28/23 1405 03/07/23 1701 03/23/23 1123 03/28/23 1405 04/04/23 1643     Exercise Goal Re-Evaluation   Exercise Goals Review Increase Physical  Activity;Increase Strength and Stamina;Understanding of Exercise Prescription Increase Physical Activity;Increase Strength and Stamina;Understanding of Exercise Prescription Increase Physical Activity;Increase Strength and Stamina;Understanding of Exercise Prescription Increase Physical Activity;Increase Strength and Stamina;Understanding of Exercise Prescription Increase Physical Activity;Increase Strength and Stamina;Understanding of Exercise Prescription   Comments [name] is doing well in rehab. He is going to clubhouse at [facility] for exercise and planning to add in swimming soon at the the pool.  He is looking forward  to getting into pool to have it go easiser on his joints. [name] continues to do well in rehab. He recently improved to level 4 on the XR and level 5 on the recumbent bike. He also increased his overall average MET level to 3.56 METs. He has consistently walked above 40 laps on the track as well. We will continue to monitor his progress in the program. [name] only attended rehab twice since the last review. He continues to work at level 4 on the XR and level 5 on the recumbent bike. He also has continued to wak above 40 laps on the track. We will continue to monitor his progress in the program. [name] continues to do well in rehab. Today is working at [employer] level of 5 on the XR (METS 4.1). [name] just got back from the beach and said he was trying to walk while on vacation. But also states he feels a "little slow" after coming back from the beach. He has been walking, using recumbent bike and weights at home 5 days a week. HAS attended 1 sesion in the past 2 weeks. maintianed his workload on the Xr and his laps on the track.  Will continue to monitor his progress and watch his attendance.   Expected Outcomes Short: Get in the pool Long: Continue to exercise independently Short: Continue to progressively increase workloads and push for more laps on the track. Long: Continue to improve strength and  stamina. Short: Try level 5 on the XR. Long: Continue to increase overall MET level and stamina. Short: Maintain home exercise on off days of cardiac rehab class Long: Maintain independant exercise routine after graduation [facility] Attend all scheduled sessions to progress his exercise workloads to work on stamina and strangthe LTG continued exercise progression            Discharge Exercise Prescription (Final Exercise Prescription Changes):  Exercise Prescription Changes - 04/04/23 1600       Response to Exercise   Blood Pressure (Admit) 118/60    Blood Pressure (Exit) 120/68    Heart Rate (Admit) 82 bpm    Heart Rate (Exercise) 106 bpm    Heart Rate (Exit) 85 bpm    Rating of Perceived Exertion (Exercise) 11    Symptoms none    Duration Continue with 30 min of aerobic exercise without signs/symptoms of physical distress.    Intensity THRR unchanged      Progression   Progression Continue to progress workloads to maintain intensity without signs/symptoms of physical distress.    Average METs 3.97      Resistance Training   Training Prescription Yes    Weight 3 lb    Reps 10-15      Interval Training   Interval Training No      REL-XR   Level 4    Minutes 15    METs 3.34      Track   Laps 43    Minutes 15    METs 3.34      Home Exercise Plan   Plans to continue exercise at Home (comment)   walking, elliptical   Frequency Add 3 additional days to program exercise sessions.    Initial Home Exercises Provided 01/31/23      Oxygen   Maintain Oxygen Saturation 88% or higher             Nutrition:  Target Goals: Understanding of nutrition guidelines, daily intake of sodium 1500mg , cholesterol 200mg , calories 30% from fat and 7%  or less from saturated fats, daily to have 5 or more servings of fruits and vegetables.  Education: All About Nutrition: -Group instruction provided by verbal, written material, interactive activities, discussions, models, and posters to  present general guidelines for heart healthy nutrition including fat, fiber, MyPlate, the role of sodium in heart healthy nutrition, utilization of the nutrition label, and utilization of this knowledge for meal planning. Follow up email sent as well. Written material given at graduation.   Biometrics:  Pre Biometrics - 01/19/23 1542       Pre Biometrics   Height 5' 8.5" (1.74 m)    Weight 163 lb 12.8 oz (74.3 kg)    Waist Circumference 36.5 inches    Hip Circumference 36 inches    Waist to Hip Ratio 1.01 %    BMI (Calculated) 24.54    Single Leg Stand 8.7 seconds              Nutrition Therapy Plan and Nutrition Goals:  Nutrition Therapy & Goals - 01/19/23 1522       Intervention Plan   Intervention Prescribe, educate and counsel regarding individualized specific dietary modifications aiming towards targeted core components such as weight, hypertension, lipid management, diabetes, heart failure and other comorbidities.    Expected Outcomes Short Term Goal: Understand basic principles of dietary content, such as calories, fat, sodium, cholesterol and nutrients.;Short Term Goal: A plan has been developed with personal nutrition goals set during dietitian appointment.;Long Term Goal: Adherence to prescribed nutrition plan.             Nutrition Assessments:  MEDIFICTS Score Key: ?70 Need to make dietary changes  40-70 Heart Healthy Diet ? 40 Therapeutic Level Cholesterol Diet  Flowsheet Row Cardiac Rehab from 01/19/2023 in [facility] Cardiac and Pulmonary Rehab  Picture Your Plate Total Score on Admission 77      Picture Your Plate Scores: <16 Unhealthy dietary pattern with much room for improvement. 41-50 Dietary pattern unlikely to meet recommendations for good health and room for improvement. 51-60 More healthful dietary pattern, with some room for improvement.  >60 Healthy dietary pattern, although there may be some specific behaviors that could be improved.     Nutrition Goals Re-Evaluation:  Nutrition Goals Re-Evaluation     Row Name 01/31/23 1436 02/28/23 1407 03/28/23 1339         Goals   Nutrition Goal Review dietary information Short: Review heart healthy diet paperwork Long: Meet with dietitian --     Comment [name] is off to a good start in rehab.  He feels that he has some knowledge of a heart healthy diet, but he would like to meet with dietitian once they start.  In the mean time, he is trying to get in a good variety and monitor his sodium intake.  He was given a packet to review on heart healthy diet. [name] is doing well in rehab.  He is doing well on his diet and read through material.  He is still following [place] diet and eating more fruits and vegetables.   He feels confident with his diet currently. Pt scheduled RD appt for 7/3     Expected Outcome Short: Review heart healthy diet paperwork Long: Meet with dietitian Short: Set up appt with dietitian Long: Continue with adding in fruits and vegetables Short: Meet with dietician Long: Maintain goals set with dietician              Nutrition Goals Discharge (Final Nutrition Goals Re-Evaluation):  Nutrition  Goals Re-Evaluation - 03/28/23 1339       Goals   Comment Pt scheduled RD appt for 7/3    Expected Outcome Short: Meet with dietician Long: Maintain goals set with dietician             Psychosocial: Target Goals: Acknowledge presence or absence of significant depression and/or stress, maximize coping skills, provide positive support system. Participant is able to verbalize types and ability to use techniques and skills needed for reducing stress and depression.   Education: Stress, Anxiety, and Depression - Group verbal and visual presentation to define topics covered.  Reviews how body is impacted by stress, anxiety, and depression.  Also discusses healthy ways to reduce stress and to treat/manage anxiety and depression.  Written material given at  graduation. Flowsheet Row Cardiac Rehab from 02/23/2023 in [facility] Cardiac and Pulmonary Rehab  Date 02/16/23  Educator KW  Instruction Review Code 1- [employer] Understanding       Education: Sleep Hygiene -Provides group verbal and written instruction about how sleep can affect your health.  Define sleep hygiene, discuss sleep cycles and impact of sleep habits. Review good sleep hygiene tips.    Initial Review & Psychosocial Screening:  Initial Psych Review & Screening - 01/04/23 1410       Initial Review   Current issues with Current Sleep Concerns;Current Stress Concerns    Source of Stress Concerns Unable to participate in former interests or hobbies      Family Dynamics   Good Support System? Yes   wife, mother, sister, extended family     Barriers   Psychosocial barriers to participate in program There are no identifiable barriers or psychosocial needs.;The patient should benefit from training in stress management and relaxation.      Screening Interventions   Interventions Encouraged to exercise;To provide support and resources with identified psychosocial needs;Provide feedback about the scores to participant    Expected Outcomes Short Term goal: Utilizing psychosocial counselor, staff and physician to assist with identification of specific Stressors or current issues interfering with healing process. Setting desired goal for each stressor or current issue identified.;Long Term Goal: Stressors or current issues are controlled or eliminated.;Short Term goal: Identification and review with participant of any Quality of Life or Depression concerns found by scoring the questionnaire.;Long Term goal: The participant improves quality of Life and PHQ9 Scores as seen by post scores and/or verbalization of changes             Quality of Life Scores:   Quality of Life - 01/19/23 1522       Quality of Life   Select Quality of Life      Quality of Life Scores   Health/Function  Pre 16.77 %    Socioeconomic Pre 20.38 %    Psych/Spiritual Pre 20.57 %    Family Pre 17.4 %    GLOBAL Post 18.44 %            Scores of 19 and below usually indicate a poorer quality of life in these areas.  A difference of  2-3 points is a clinically meaningful difference.  A difference of 2-3 points in the total score of the Quality of Life Index has been associated with significant improvement in overall quality of life, self-image, physical symptoms, and general health in studies assessing change in quality of life.  PHQ-9: Review Flowsheet  More data exists      02/28/2023 01/19/2023 01/10/2023 12/14/2022 12/31/2021  Depression screen PHQ 2/9  Decreased Interest 0 0 0 0 0  Down, Depressed, Hopeless 0 0 0 0 0  PHQ - 2 Score 0 0 0 0 0  Altered sleeping 2 3 0 1 0  Tired, decreased energy 1 2 - - 0  Change in appetite 0 0 0 0 0  Feeling bad or failure about yourself  0 0 0 1 0  Trouble concentrating 0 0 0 0 0  Moving slowly or fidgety/restless 0 1 0 0 0  Suicidal thoughts 0 0 0 0 0  PHQ-9 Score 3 6 0 2 0  Difficult doing work/chores Very difficult Somewhat difficult Not difficult at all Somewhat difficult -   Interpretation of Total Score  Total Score Depression Severity:  1-4 = Minimal depression, 5-9 = Mild depression, 10-14 = Moderate depression, 15-19 = Moderately severe depression, 20-27 = Severe depression   Psychosocial Evaluation and Intervention:  Psychosocial Evaluation - 01/04/23 1410       Psychosocial Evaluation & Interventions   Interventions Encouraged to exercise with the program and follow exercise prescription    Comments [name] is coming to 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 He has a history of spine and neck issues and he can't take his usual medication due to his newer cardiac meds. This is stressful for him and he is tired of being in pain. He has a history of sleep concerns, but his sleep habits have gotten worse since his MI. He wakes up around 3 and can't go  back to sleep until around 7am. He states his doctors can't give him a clear answer as to why. He did has his PCP about trying CBD oil for his peripheral neuropathy and sleep and she cleared him to do so. He is hopeful the CBD and getting more active in the program will help him sleep better. He can't stand for a long period of time related to his spinal issues and is currently not working. He is hopeful the program will help him understand his cardiac health and develop an exercise routine that fits his needs.    Expected Outcomes Short: attend cardiac rehab for education and exercise. Long: develop and maintain positive self care habits.    Continue Psychosocial Services  Follow up required by staff             Psychosocial Re-Evaluation:  Psychosocial Re-Evaluation     Row Name 01/31/23 1415 02/28/23 1409 03/28/23 1413         Psychosocial Re-Evaluation   Current issues with Current Stress Concerns;Current Depression Current Stress Concerns;Current Depression Current Depression;Current Sleep Concerns     Comments [name] is doing well mentally.  He is hoping to meet with therapist soon. Still not sleeping well and has not talked with anyone yet.  He got a name from his pain management clinic to see someone that is used to dealing with more chornic diseases. The CBD oil is helping with his neuropathy some.  He is eager to get started talking to someone to see if it would help. [name] is doing well in rehab.  He is still having sleep issues.  He did talk to his pyschiatrist about sleep and exhaustion.  They felt it may be linked to his Earlyne Iba so he is supposed to talk to his cardiologist about that.  He is still using the CBD olil to help with and pain and finds that it is helpful some.  His PHQ has improved since last month.  He is looking forward to getting  back in the pool to swim and water walk. [name] is doing well in rehab. He just got back from the beach and is excited to have rejoined the  pool. He has been swimming again which he is happy to report. He is also glad to report his sleep has improved. His MD increased his current Trazodone dose and this has been successful.     Expected Outcomes Short: Call and get appointment set up Long: Conitnue to work on improving sleep and coping with stress. Short: Talk to doctor about sleep Long: Continue to exercise for mental boost Short: Continue swimmng and continue Trazodone  Long: Continue doing what enjoys to imrpove health, maintain good sleep habits     Interventions Encouraged to attend Cardiac Rehabilitation for the exercise;Stress management education -- --     Continue Psychosocial Services  Follow up required by staff -- Follow up required by staff              Psychosocial Discharge (Final Psychosocial Re-Evaluation):  Psychosocial Re-Evaluation - 03/28/23 1413       Psychosocial Re-Evaluation   Current issues with Current Depression;Current Sleep Concerns    Comments [name] is doing well in rehab. He just got back from the beach and is excited to have rejoined the pool. He has been swimming again which he is happy to report. He is also glad to report his sleep has improved. His MD increased his current Trazodone dose and this has been successful.    Expected Outcomes Short: Continue swimmng and continue Trazodone  Long: Continue doing what enjoys to imrpove health, maintain good sleep habits    Continue Psychosocial Services  Follow up required by staff             Vocational Rehabilitation: Provide vocational rehab assistance to qualifying candidates.   Vocational Rehab Evaluation & Intervention:  Vocational Rehab - 01/04/23 1410       Initial Vocational Rehab Evaluation & Intervention   Assessment shows need for Vocational Rehabilitation No             Education: Education Goals: Education classes will be provided on a variety of topics geared toward better understanding of heart health and risk factor  modification. Participant will state understanding/return demonstration of topics presented as noted by education test scores.  Learning Barriers/Preferences:  Learning Barriers/Preferences - 01/04/23 1407       Learning Barriers/Preferences   Learning Barriers None    Learning Preferences None             General Cardiac Education Topics:  AED/CPR: - Group verbal and written instruction with the use of models to demonstrate the basic use of the AED with the basic ABC's of resuscitation.   Anatomy and Cardiac Procedures: - Group verbal and visual presentation and models provide information about basic cardiac anatomy and function. Reviews the testing methods done to diagnose heart disease and the outcomes of the test results. Describes the treatment choices: Medical Management, Angioplasty, or Coronary Bypass Surgery for treating various heart conditions including Myocardial Infarction, Angina, Valve Disease, and Cardiac Arrhythmias.  Written material given at graduation.   Medication Safety: - Group verbal and visual instruction to review commonly prescribed medications for heart and lung disease. Reviews the medication, class of the drug, and side effects. Includes the steps to properly store meds and maintain the prescription regimen.  Written material given at graduation. Flowsheet Row Cardiac Rehab from 02/23/2023 in [facility] Cardiac and Pulmonary Rehab  Date 01/26/23  Educator SB  Instruction Review Code 1- Verbalizes Understanding       Intimacy: - Group verbal instruction through game format to discuss how heart and lung disease can affect sexual intimacy. Written material given at graduation..   Know Your Numbers and Heart Failure: - Group verbal and visual instruction to discuss disease risk factors for cardiac and pulmonary disease and treatment options.  Reviews associated critical values for Overweight/Obesity, Hypertension, Cholesterol, and Diabetes.  Discusses  basics of heart failure: signs/symptoms and treatments.  Introduces Heart Failure Zone chart for action plan for heart failure.  Written material given at graduation. Flowsheet Row Cardiac Rehab from 02/23/2023 in [facility] Cardiac and Pulmonary Rehab  Date 02/02/23  Educator MS  Instruction Review Code 1- Verbalizes Understanding       Infection Prevention: - Provides verbal and written material to individual with discussion of infection control including proper hand washing and proper equipment cleaning during exercise session. Flowsheet Row Cardiac Rehab from 02/23/2023 in [facility] Cardiac and Pulmonary Rehab  Date 01/19/23  Educator NT  Instruction Review Code 1- Verbalizes Understanding       Falls Prevention: - Provides verbal and written material to individual with discussion of falls prevention and safety. Flowsheet Row Cardiac Rehab from 02/23/2023 in [facility] Cardiac and Pulmonary Rehab  Date 01/19/23  Educator NT  Instruction Review Code 1- Verbalizes Understanding       Other: -Provides group and verbal instruction on various topics (see comments)   Knowledge Questionnaire Score:  Knowledge Questionnaire Score - 01/19/23 1523       Knowledge Questionnaire Score   Pre Score 24/26             Core Components/Risk Factors/Patient Goals at Admission:  Personal Goals and Risk Factors at Admission - 01/19/23 1523       Core Components/Risk Factors/Patient Goals on Admission    Weight Management Yes;Weight Loss    Intervention Weight Management: Provide education and appropriate resources to help participant work on and attain dietary goals.;Weight Management: Develop a combined nutrition and exercise program designed to reach desired caloric intake, while maintaining appropriate intake of nutrient and fiber, sodium and fats, and appropriate energy expenditure required for the weight goal.;Weight Management/Obesity: Establish reasonable short term and long term weight goals.     Admit Weight 163 lb 12.8 oz (74.3 kg)    Goal Weight: Short Term 158 lb (71.7 kg)    Goal Weight: Long Term 153 lb (69.4 kg)    Expected Outcomes Long Term: Adherence to nutrition and physical activity/exercise program aimed toward attainment of established weight goal;Short Term: Continue to assess and modify interventions until short term weight is achieved;Weight Loss: Understanding of general recommendations for a balanced deficit meal plan, which promotes 1-2 lb weight loss per week and includes a negative energy balance of [phone] kcal/d;Understanding recommendations for meals to include 15-35% energy as protein, 25-35% energy from fat, 35-60% energy from carbohydrates, less than 200mg  of dietary cholesterol, 20-35 gm of total fiber daily;Understanding of distribution of calorie intake throughout the day with the consumption of 4-5 meals/snacks    Hypertension Yes    Intervention Provide education on lifestyle modifcations including regular physical activity/exercise, weight management, moderate sodium restriction and increased consumption of fresh fruit, vegetables, and low fat dairy, alcohol moderation, and smoking cessation.;Monitor prescription use compliance.    Expected Outcomes Short Term: Continued assessment and intervention until BP is < 140/29mm HG in hypertensive participants. < 130/57mm HG in hypertensive participants with diabetes, heart  failure or chronic kidney disease.;Long Term: Maintenance of blood pressure at goal levels.    Lipids Yes    Intervention Provide education and support for participant on nutrition & aerobic/resistive exercise along with prescribed medications to achieve LDL 70mg , HDL >40mg .    Expected Outcomes Short Term: Participant states understanding of desired cholesterol values and is compliant with medications prescribed. Participant is following exercise prescription and nutrition guidelines.;Long Term: Cholesterol controlled with medications as  prescribed, with individualized exercise RX and with personalized nutrition plan. Value goals: LDL < 70mg , HDL > 40 mg.             Education:Diabetes - Individual verbal and written instruction to review signs/symptoms of diabetes, desired ranges of glucose level fasting, after meals and with exercise. Acknowledge that pre and post exercise glucose checks will be done for 3 sessions at entry of program.   Core Components/Risk Factors/Patient Goals Review:   Goals and Risk Factor Review     Row Name 01/31/23 1417 02/28/23 1407 03/28/23 1343         Core Components/Risk Factors/Patient Goals Review   Personal Goals Review Weight Management/Obesity;Hypertension Weight Management/Obesity;Hypertension Weight Management/Obesity;Hypertension     Review [name] is off to a good start in rehab.  He would like to work on losing weight and has already started on that trend.  His wife is helping to keep an eye on his pressures and they check in on occassion at home. [name] is doing well in rehab. His pressures are doing well and his weight is up a little recently but steady for most part.  He is pleased with his progress. [name] continues to do well in rehab. His BP has remained well controlled per today at 118/62 and per pt report. His wt today 167. He does not have specific weight loss goals but does want to maintain, build strenghth and stamina. He feels his strength is improving. [name] still has concerns that his Brilinta is causing fatigue. He notices fatigue even after he sleeps well. Discussed with [name] talking to his cardiologist about possible medication changes if related to symptoms.     Expected Outcomes Short: Continue to work on weight loss Long: Continue to monitor risk factors Short; COnitnue to work on [employer] maintenance Long: Continue to monitor risk factors Short: Talk to cardiologist about possible s/e of Brlinta  Long: Continue to monitor risk factors              Core  Components/Risk Factors/Patient Goals at Discharge (Final Review):   Goals and Risk Factor Review - 03/28/23 1343       Core Components/Risk Factors/Patient Goals Review   Personal Goals Review Weight Management/Obesity;Hypertension    Review [name] continues to do well in rehab. His BP has remained well controlled per today at 118/62 and per pt report. His wt today 167. He does not have specific weight loss goals but does want to maintain, build strenghth and stamina. He feels his strength is improving. [name] still has concerns that his Brilinta is causing fatigue. He notices fatigue even after he sleeps well. Discussed with [name] talking to his cardiologist about possible medication changes if related to symptoms.    Expected Outcomes Short: Talk to cardiologist about possible s/e of Brlinta  Long: Continue to monitor risk factors             ITP Comments:  ITP Comments     Row Name 01/04/23 1423 01/19/23 1520 01/24/23 1345 02/09/23 0920 03/09/23 1113  ITP Comments Initial phone call completed. Diagnosis can be found in [facility] 3/15. EP Orientation scheduled for Wednesday 4/24 at 1:30pm. Completed and gym orientation. Initial ITP created and sent for review to Dr. Bethann Punches, Medical Director. First full day of exercise!  Patient was oriented to gym and equipment including functions, settings, policies, and procedures.  [name] individual exercise prescription and treatment plan were reviewed.  All starting workloads were established based on the results of the 6 minute walk test done at initial orientation visit.  The plan for exercise progression was also introduced and progression will be customized based on patient's performance and goals. 30 Day review completed. Medical Director ITP review done, changes made as directed, and signed approval by Medical Director.   new to program 30 Day review completed. Medical Director ITP review done, changes made as directed, and signed approval by  Medical Director.    Row Name 04/05/23 1446           ITP Comments 30 Day review completed. Medical Director ITP review done, changes made as directed, and signed approval by Medical Director.                Comments:

## 2023-04-06 ENCOUNTER — Encounter: Payer: PPO | Admitting: *Deleted

## 2023-04-06 DIAGNOSIS — Z955 Presence of coronary angioplasty implant and graft: Secondary | ICD-10-CM

## 2023-04-06 DIAGNOSIS — I214 Non-ST elevation (NSTEMI) myocardial infarction: Secondary | ICD-10-CM

## 2023-04-06 NOTE — Progress Notes (Signed)
Daily Session Note  Patient Details  Name: [name] MRN: [id number] Date of Birth: May 13, 1961 Referring Provider:   Flowsheet Row Cardiac Rehab from 01/19/2023 in [facility] Cardiac and Pulmonary Rehab  Referring Provider Dr. Bryan Lemma, MD       Encounter Date: 04/06/2023  Check In:  Session Check In - 04/06/23 1329       Check-In   Supervising physician immediately available to respond to emergencies See telemetry face sheet for immediately available ER MD    Location [facility]-Cardiac & Pulmonary Rehab    Staff Present Elige Ko, RCP,RRT,BSRT;Laureen Manson Passey, BS, RRT, CPFT;Reather Steller Jewel Baize, RN BSN    Virtual Visit No    Medication changes reported     Yes    Comments stopped metoprolol    Fall or balance concerns reported    No    Warm-up and Cool-down Performed on first and last piece of equipment    Resistance Training Performed Yes    VAD Patient? No    PAD/SET Patient? No                Social History   Tobacco Use  Smoking Status Former  Smokeless Tobacco Never  Tobacco Comments   quit 35 years    Goals Met:  Independence with exercise equipment Exercise tolerated well No report of concerns or symptoms today Strength training completed today  Goals Unmet:  Not Applicable  Comments: Pt able to follow exercise prescription today without complaint.  Will continue to monitor for progression.    Dr. Bethann Punches is Medical Director for [facility] Cardiac Rehabilitation.  Dr. Vida Rigger is Medical Director for [facility] Pulmonary Rehabilitation.

## 2023-04-11 ENCOUNTER — Encounter: Payer: PPO | Admitting: *Deleted

## 2023-04-11 DIAGNOSIS — Z955 Presence of coronary angioplasty implant and graft: Secondary | ICD-10-CM

## 2023-04-11 DIAGNOSIS — I214 Non-ST elevation (NSTEMI) myocardial infarction: Secondary | ICD-10-CM

## 2023-04-11 NOTE — Progress Notes (Signed)
Daily Session Note  Patient Details  Name: [name] MRN: [id number] Date of Birth: 1960/12/29 Referring Provider:   Flowsheet Row Cardiac Rehab from 01/19/2023 in [facility] Cardiac and Pulmonary Rehab  Referring Provider Dr. Bryan Lemma, MD       Encounter Date: 04/11/2023  Check In:  Session Check In - 04/11/23 1335       Check-In   Supervising physician immediately available to respond to emergencies See telemetry face sheet for immediately available ER MD    Location [facility]-Cardiac & Pulmonary Rehab    Staff Present Susann Givens, RN BSN;Laureen Manson Passey, BS, RRT, CPFT;Megan Katrinka Blazing, RN, ADN    Virtual Visit No    Medication changes reported     No    Fall or balance concerns reported    No    Warm-up and Cool-down Performed on first and last piece of equipment    Resistance Training Performed Yes    VAD Patient? No    PAD/SET Patient? No      Pain Assessment   Currently in Pain? No/denies                Social History   Tobacco Use  Smoking Status Former  Smokeless Tobacco Never  Tobacco Comments   quit 35 years    Goals Met:  Independence with exercise equipment Exercise tolerated well No report of concerns or symptoms today Strength training completed today  Goals Unmet:  Not Applicable  Comments: Pt able to follow exercise prescription today without complaint.  Will continue to monitor for progression.    Dr. Bethann Punches is Medical Director for [facility] Cardiac Rehabilitation.  Dr. Vida Rigger is Medical Director for [facility] Pulmonary Rehabilitation.

## 2023-04-15 ENCOUNTER — Other Ambulatory Visit: Payer: Self-pay | Admitting: Family Medicine

## 2023-04-20 ENCOUNTER — Encounter: Payer: PPO | Admitting: *Deleted

## 2023-04-20 DIAGNOSIS — I214 Non-ST elevation (NSTEMI) myocardial infarction: Secondary | ICD-10-CM | POA: Diagnosis not present

## 2023-04-20 DIAGNOSIS — Z955 Presence of coronary angioplasty implant and graft: Secondary | ICD-10-CM

## 2023-04-20 NOTE — Progress Notes (Signed)
Daily Session Note  Patient Details  Name: [name] MRN: [id number] Date of Birth: 1960/10/19 Referring Provider:   Flowsheet Row Cardiac Rehab from 01/19/2023 in [facility] Cardiac and Pulmonary Rehab  Referring Provider Dr. Bryan Lemma, MD       Encounter Date: 04/20/2023  Check In:  Session Check In - 04/20/23 1342       Check-In   Supervising physician immediately available to respond to emergencies See telemetry face sheet for immediately available ER MD    Location [facility]-Cardiac & Pulmonary Rehab    Staff Present Susann Givens, RN BSN;Other;Megan Katrinka Blazing, RN, ADN   Girtha Rm, Max Conetta   Virtual Visit No    Medication changes reported     No    Fall or balance concerns reported    No    Warm-up and Cool-down Performed on first and last piece of equipment    Resistance Training Performed Yes    VAD Patient? No    PAD/SET Patient? No      Pain Assessment   Currently in Pain? No/denies                Social History   Tobacco Use  Smoking Status Former  Smokeless Tobacco Never  Tobacco Comments   quit 35 years    Goals Met:  Independence with exercise equipment Exercise tolerated well No report of concerns or symptoms today Strength training completed today  Goals Unmet:  Not Applicable  Comments: Pt able to follow exercise prescription today without complaint.  Will continue to monitor for progression.    Dr. Bethann Punches is Medical Director for [facility] Cardiac Rehabilitation.  Dr. Vida Rigger is Medical Director for [facility] Pulmonary Rehabilitation.

## 2023-04-25 ENCOUNTER — Telehealth: Payer: Self-pay | Admitting: Cardiology

## 2023-04-25 ENCOUNTER — Encounter: Payer: PPO | Admitting: *Deleted

## 2023-04-25 DIAGNOSIS — I472 Ventricular tachycardia, unspecified: Secondary | ICD-10-CM

## 2023-04-25 DIAGNOSIS — I25119 Atherosclerotic heart disease of native coronary artery with unspecified angina pectoris: Secondary | ICD-10-CM

## 2023-04-25 DIAGNOSIS — I214 Non-ST elevation (NSTEMI) myocardial infarction: Secondary | ICD-10-CM | POA: Diagnosis not present

## 2023-04-25 DIAGNOSIS — Z955 Presence of coronary angioplasty implant and graft: Secondary | ICD-10-CM

## 2023-04-25 NOTE — Telephone Encounter (Signed)
Patient stated when he wakes up in the morning, he feels like he is having a panic attack. Patient states that his heart will start racing in the morning. Patient stated they have not checked their HR when this happens, but one time they checked their BP was it was perfect. Patient stated this started happening about 2 months ago. Please advise.

## 2023-04-25 NOTE — Telephone Encounter (Signed)
Looking at Dr. Elissa Hefty notes, he was trying to see if stopping the metoprolol would alleviate his complaints of fatigue.  Let's have him stay on the metoprolol succinate 12.5 mg daily at bedtime and have him wear a 7-day rhythm monitor to see if his racing heart is an expression of an abnormal rhythm.

## 2023-04-25 NOTE — Telephone Encounter (Signed)
Patient states episode of "racing heart". States las until his morning medication and then resolves.  Then states he is "feeling it now" but does take time after meds.  Takes medication immediatly once he gets up.  He states this morning it woke him up at 7am.   Last notes he is to taper and then stop metoprolol, which he has done.  He states no change and "just want to know what is causing all this".  Per last visit possible anxiety or the meds which reason for taper   He ask for any answers/recommendations.

## 2023-04-25 NOTE — Telephone Encounter (Signed)
Patient stated when he wakes up it feels

## 2023-04-25 NOTE — Progress Notes (Signed)
Daily Session Note  Patient Details  Name: [name] MRN: [id number] Date of Birth: June 02, 1961 Referring Provider:   Flowsheet Row Cardiac Rehab from 01/19/2023 in [facility] Cardiac and Pulmonary Rehab  Referring Provider Dr. Bryan Lemma, MD       Encounter Date: 04/25/2023  Check In:  Session Check In - 04/25/23 1336       Check-In   Supervising physician immediately available to respond to emergencies See telemetry face sheet for immediately available ER MD    Location [facility]-Cardiac & Pulmonary Rehab    Staff Present Susann Givens, RN Atilano Median, RN, ADN;Joseph Reino Kent, RCP,RRT,BSRT   Maggie Best MS   Virtual Visit No    Medication changes reported     No    Fall or balance concerns reported    No    Warm-up and Cool-down Performed on first and last piece of equipment    Resistance Training Performed Yes    VAD Patient? No    PAD/SET Patient? No      Pain Assessment   Currently in Pain? No/denies                Social History   Tobacco Use  Smoking Status Former  Smokeless Tobacco Never  Tobacco Comments   quit 35 years    Goals Met:  Independence with exercise equipment Exercise tolerated well No report of concerns or symptoms today Strength training completed today  Goals Unmet:  Not Applicable  Comments: Pt able to follow exercise prescription today without complaint.  Will continue to monitor for progression.    Dr. Bethann Punches is Medical Director for [facility] Cardiac Rehabilitation.  Dr. Vida Rigger is Medical Director for [facility] Pulmonary Rehabilitation.

## 2023-04-26 ENCOUNTER — Ambulatory Visit: Payer: PPO | Attending: Cardiovascular Disease

## 2023-04-26 DIAGNOSIS — I25119 Atherosclerotic heart disease of native coronary artery with unspecified angina pectoris: Secondary | ICD-10-CM

## 2023-04-26 DIAGNOSIS — I214 Non-ST elevation (NSTEMI) myocardial infarction: Secondary | ICD-10-CM

## 2023-04-26 DIAGNOSIS — I472 Ventricular tachycardia, unspecified: Secondary | ICD-10-CM

## 2023-04-26 NOTE — Telephone Encounter (Signed)
Patient does not want to start metoprolol.  He states once stopping the metoprolol he had multiple symptoms that subsided ranging from fatigue to constipation.  He states also anxiety issues and this could be a reason.  This was noted in his chart as well.  He ask if at all possible not to start back on the metoprolol.  Please advise  He will place monitor on once received.

## 2023-04-26 NOTE — Telephone Encounter (Signed)
Patient aware to do monitor and then will go forward after results.

## 2023-04-26 NOTE — Telephone Encounter (Signed)
Call to mobile and goes straight to VM.  LM to call office Home number is no longer in service.  Monitor is ordered.  Need to advise patient of med and monitor  Per Dr C Looking at Dr. Elissa Hefty notes, he was trying to see if stopping the metoprolol would alleviate his complaints of fatigue.  Let's have him stay on the metoprolol succinate 12.5 mg daily at bedtime and have him wear a 7-day rhythm monitor to see if his racing heart is an expression of an abnormal rhythm.

## 2023-04-26 NOTE — Telephone Encounter (Signed)
Patient is returning call.  °

## 2023-04-26 NOTE — Progress Notes (Unsigned)
Enrolled patient for a 7 day Zio XT monitor to be mailed to patients home.  

## 2023-04-26 NOTE — Telephone Encounter (Signed)
That is fine.  I misunderstood and thought that he was still taking metoprolol.  Metoprolol can definitely be a cause of fatigue and other side effects.  Lets go ahead with the monitor just to make sure there is no serious rhythm abnormality.

## 2023-04-29 DIAGNOSIS — I214 Non-ST elevation (NSTEMI) myocardial infarction: Secondary | ICD-10-CM

## 2023-04-29 DIAGNOSIS — I472 Ventricular tachycardia, unspecified: Secondary | ICD-10-CM

## 2023-04-29 DIAGNOSIS — I25119 Atherosclerotic heart disease of native coronary artery with unspecified angina pectoris: Secondary | ICD-10-CM | POA: Diagnosis not present

## 2023-05-02 ENCOUNTER — Encounter: Payer: PPO | Attending: Cardiology | Admitting: *Deleted

## 2023-05-02 DIAGNOSIS — Z955 Presence of coronary angioplasty implant and graft: Secondary | ICD-10-CM | POA: Diagnosis not present

## 2023-05-02 DIAGNOSIS — I214 Non-ST elevation (NSTEMI) myocardial infarction: Secondary | ICD-10-CM | POA: Insufficient documentation

## 2023-05-02 NOTE — Progress Notes (Signed)
Daily Session Note  Patient Details  Name: [name] MRN: [id number] Date of Birth: [birth year] Referring Provider:   Flowsheet Row Cardiac Rehab from 01/19/2023 in [facility] Cardiac and Pulmonary Rehab  Referring Provider Dr. Bryan Lemma, MD       Encounter Date: 05/02/2023  Check In:  Session Check In - 05/02/23 1337       Check-In   Staff Present Susann Givens, RN BSN;Megan Katrinka Blazing, RN, ADN;Joseph Reino Kent, RCP,RRT,BSRT    Virtual Visit No    Medication changes reported     No    Fall or balance concerns reported    No    Warm-up and Cool-down Performed on first and last piece of equipment    Resistance Training Performed Yes    VAD Patient? No    PAD/SET Patient? No      Pain Assessment   Currently in Pain? No/denies                Social History   Tobacco Use  Smoking Status Former  Smokeless Tobacco Never  Tobacco Comments   quit 35 years    Goals Met:  Independence with exercise equipment Exercise tolerated well No report of concerns or symptoms today Strength training completed today  Goals Unmet:  Not Applicable  Comments: Pt able to follow exercise prescription today without complaint.  Will continue to monitor for progression.    Dr. Bethann Punches is Medical Director for [facility] Cardiac Rehabilitation.  Dr. Vida Rigger is Medical Director for [facility] Pulmonary Rehabilitation.

## 2023-05-04 ENCOUNTER — Ambulatory Visit (INDEPENDENT_AMBULATORY_CARE_PROVIDER_SITE_OTHER): Payer: PPO | Admitting: Clinical

## 2023-05-04 ENCOUNTER — Encounter: Payer: Self-pay | Admitting: *Deleted

## 2023-05-04 DIAGNOSIS — F419 Anxiety disorder, unspecified: Secondary | ICD-10-CM

## 2023-05-04 DIAGNOSIS — I214 Non-ST elevation (NSTEMI) myocardial infarction: Secondary | ICD-10-CM

## 2023-05-04 DIAGNOSIS — Z955 Presence of coronary angioplasty implant and graft: Secondary | ICD-10-CM
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
## 2023-05-04 NOTE — Progress Notes (Signed)
Cardiac Individual Treatment Plan  Patient Details  Name: [name] MRN: [id number] Date of Birth: 01-25-1961 Referring Provider:   Flowsheet Row Cardiac Rehab from 01/19/2023 in [facility] Cardiac and Pulmonary Rehab  Referring Provider Dr. Bryan Lemma, MD       Initial Encounter Date:  Flowsheet Row Cardiac Rehab from 01/19/2023 in [facility] Cardiac and Pulmonary Rehab  Date 01/19/23       Visit Diagnosis: NSTEMI (non-ST elevated myocardial infarction) [facility])  Status post coronary artery stent placement  [name] Home Medications on Admission:  Current Outpatient Medications:    acyclovir (ZOVIRAX) 800 MG tablet, Take 1 tablet (800 mg total) by mouth 2 (two) times daily. (Patient not taking: Reported on 01/17/2023), Disp: 180 tablet, Rfl: 3   amLODipine (NORVASC) 10 MG tablet, Take 1 tablet (10 mg total) by mouth every evening., Disp: 90 tablet, Rfl: 1   aspirin EC 81 MG tablet, Take 1 tablet (81 mg total) by mouth daily. Swallow whole., Disp: 30 tablet, Rfl: 12   atorvastatin (LIPITOR) 80 MG tablet, Take 1 tablet (80 mg total) by mouth daily., Disp: 90 tablet, Rfl: 2   baclofen (LIORESAL) 20 MG tablet, Take 1 tablet (20 mg total) by mouth every 8 (eight) hours as needed for muscle spasms., Disp: 60 each, Rfl: 5   cetirizine (ZYRTEC) 10 MG tablet, Take 10 mg by mouth daily. (Patient not taking: Reported on 01/17/2023), Disp: , Rfl:    diazepam (VALIUM) 10 MG tablet, Take 10 mg by mouth 2 (two) times daily., Disp: , Rfl:    diclofenac sodium (VOLTAREN) 1 % GEL, Apply 1 application  topically daily as needed (For pain). (Patient not taking: Reported on 01/17/2023), Disp: , Rfl:    EPINEPHrine 0.3 mg/0.3 mL IJ SOAJ injection, USE AS DIRECTED (Patient not taking: Reported on 01/17/2023), Disp: 2 each, Rfl: 1   fentaNYL (DURAGESIC - DOSED MCG/HR) 50 MCG/HR, Place 50 mcg onto the skin every other day., Disp: , Rfl:    fluticasone (FLONASE) 50 MCG/ACT nasal spray, USE TWO SPRAYS IN EACH  NOSTRIL DAILY (Patient not taking: Reported on 01/17/2023), Disp: 16 g, Rfl: 11   lactulose, encephalopathy, (CHRONULAC) 10 GM/15ML SOLN, Take 20 g by mouth at bedtime., Disp: , Rfl:    metoprolol succinate (TOPROL-XL) 25 MG 24 hr tablet, Take 0.5 tablets (12.5 mg total) by mouth at bedtime for 7 days. Then stop taking medication, Disp: , Rfl: 0   nitroGLYCERIN (NITROSTAT) 0.4 MG SL tablet, Place 1 tablet (0.4 mg total) under the tongue every 5 (five) minutes as needed for chest pain., Disp: 100 tablet, Rfl: 3   oxyCODONE (OXY IR/ROXICODONE) 5 MG immediate release tablet, Take 5 mg by mouth every 6 (six) hours as needed for moderate pain. (Patient not taking: Reported on 01/17/2023), Disp: , Rfl: 0   promethazine (PHENERGAN) 25 MG tablet, Take 12.5 mg by mouth every 6 (six) hours as needed for nausea or vomiting., Disp: , Rfl:    scopolamine (TRANSDERM SCOP, 1.5 MG,) 1 MG/3DAYS, Place 1 patch (1.5 mg total) onto the skin every 3 (three) days., Disp: 10 patch, Rfl: 0   ticagrelor (BRILINTA) 90 MG TABS tablet, Take 1 tablet (90 mg total) by mouth 2 (two) times daily., Disp: 60 tablet, Rfl: 11   traZODone (DESYREL) 100 MG tablet, Take 50 mg by mouth at bedtime., Disp: , Rfl:    triamcinolone cream (KENALOG) 0.1 %, Apply 1 application topically 2 (two) times daily. (Patient not taking: Reported on 01/17/2023), Disp: 30  g, Rfl: 0  Past Medical History: Past Medical History:  Diagnosis Date   ADHD (attention deficit hyperactivity disorder), inattentive type    sees Dr. Deboraha Sprang in [place], [place]    Anxiety    sees Dr. Deboraha Sprang in [place], [place]    Arthritis    Benign enlargement of prostate    sees [facility]    CAD- 1 Vessel, DES PCI LCx. (NSTEMI) 12/10/2022   LHC 12/10/2022:  Minimal LM & LAD disease. Mid Cx 99% (subtotal occlusion) =>  PCI using a 2.25 x 24 mm Synergy DES; pRCA 30% & mRCA 50%.      Carpal tunnel syndrome on left    sees Dr. Kathie Dike at [facility]    Chronic pain    sees  Dr. Thyra Breed (meds) and Altamease Oiler (accupuncture)    Depression    sees Dr. Deboraha Sprang   Functional impotence    Hyperlipidemia with target LDL less than 70 07/09/2014   Hypogonadism in male    sees Carollee Herter (a [place]) at [facility]    Insomnia    Kidney stones    NSTEMI (non-ST elevated myocardial infarction) (HCC) 12/10/2022   Admitted with stuttering chest pain, troponin elevation. => 9 9% LCx=> DES stent.  Normal EF on echo.   Sinusitis, acute 07/17/2016   Urticaria due to cold     Tobacco Use: Social History   Tobacco Use  Smoking Status Former  Smokeless Tobacco Never  Tobacco Comments   quit 35 years    Labs: Review Flowsheet  More data exists      Latest Ref Rng & Units 04/03/2019 06/05/2020 11/09/2021 12/10/2022 12/11/2022  Labs for ITP Cardiac and Pulmonary Rehab  Cholestrol 0 - 200 mg/dL 865  784  696  295  -  LDL (calc) 0 - 99 mg/dL 284  132  440  102  -  HDL-C >40 mg/dL 72.53  58  66.44  61  -  Trlycerides <150 mg/dL 034.7  425  956.3  71  -  Hemoglobin A1c 4.8 - 5.6 % - 5.8  6.0  - 5.9     Details             Exercise Target Goals: Exercise Program Goal: Individual exercise prescription set using results from initial 6 min walk test and THRR while considering  patient's activity barriers and safety.   Exercise Prescription Goal: Initial exercise prescription builds to 30-45 minutes a day of aerobic activity, 2-3 days per week.  Home exercise guidelines will be given to patient during program as part of exercise prescription that the participant will acknowledge.   Education: Aerobic Exercise: - Group verbal and visual presentation on the components of exercise prescription. Introduces F.I.T.T principle from ACSM for exercise prescriptions.  Reviews F.I.T.T. principles of aerobic exercise including progression. Written material given at graduation. Flowsheet Row Cardiac Rehab from 04/20/2023 in [facility] Cardiac and Pulmonary Rehab  Education  need identified 01/19/23       Education: Resistance Exercise: - Group verbal and visual presentation on the components of exercise prescription. Introduces F.I.T.T principle from ACSM for exercise prescriptions  Reviews F.I.T.T. principles of resistance exercise including progression. Written material given at graduation.    Education: Exercise & Equipment Safety: - Individual verbal instruction and demonstration of equipment use and safety with use of the equipment. Flowsheet Row Cardiac Rehab from 04/20/2023 in [facility] Cardiac and Pulmonary Rehab  Date 01/19/23  Educator NT  Instruction Review Code 1- Verbalizes Understanding  Education: Exercise Physiology & General Exercise Guidelines: - Group verbal and written instruction with models to review the exercise physiology of the cardiovascular system and associated critical values. Provides general exercise guidelines with specific guidelines to those with heart or lung disease.  Flowsheet Row Cardiac Rehab from 04/20/2023 in [facility] Cardiac and Pulmonary Rehab  Date 02/23/23  Educator [facility]  Instruction Review Code 1- Verbalizes Understanding       Education: Flexibility, Balance, Mind/Body Relaxation: - Group verbal and visual presentation with interactive activity on the components of exercise prescription. Introduces F.I.T.T principle from ACSM for exercise prescriptions. Reviews F.I.T.T. principles of flexibility and balance exercise training including progression. Also discusses the mind body connection.  Reviews various relaxation techniques to help reduce and manage stress (i.e. Deep breathing, progressive muscle relaxation, and visualization). Balance handout provided to take home. Written material given at graduation.   Activity Barriers & Risk Stratification:  Activity Barriers & Cardiac Risk Stratification - 01/19/23 1536       Activity Barriers & Cardiac Risk Stratification   Activity Barriers Back Problems;Shortness of  Breath;Neck/Spine Problems;Other (comment)    Comments Torn rotator cuff last year, neuropathy causes pain in feet    Cardiac Risk Stratification Moderate             6 Minute Walk:  6 Minute Walk     Row Name 01/19/23 1525         6 Minute Walk   Phase Initial     Distance 1750 feet     Walk Time 6 minutes     # of Rest Breaks 0     MPH 3.31     METS 4.52     RPE 11     Perceived Dyspnea  1     VO2 Peak 15.83     Symptoms Yes (comment)     Comments Neck Pain 3/10, Feet Pain 4/10     Resting HR 78 bpm     Resting BP 120/78     Resting Oxygen Saturation  97 %     Exercise Oxygen Saturation  during 6 min walk 95 %     Max Ex. HR 107 bpm     Max Ex. BP 134/74     2 Minute Post BP 112/68              Oxygen Initial Assessment:   Oxygen Re-Evaluation:   Oxygen Discharge (Final Oxygen Re-Evaluation):   Initial Exercise Prescription:  Initial Exercise Prescription - 01/19/23 1500       Date of Initial Exercise RX and Referring Provider   Date 01/19/23    Referring Provider Dr. Bryan Lemma, MD      Oxygen   Maintain Oxygen Saturation 88% or higher      Treadmill   MPH 2.8    Grade 1    Minutes 15    METs 3.53      Recumbant Bike   Level 3    RPM 50    Watts 59    Minutes 15    METs 4.52      NuStep   Level 4    SPM 80    Minutes 15    METs 4.52      REL-XR   Level 2    Speed 50    Minutes 15    METs 4.52      Prescription Details   Frequency (times per week) 2    Duration Progress to 30 minutes  of continuous aerobic without signs/symptoms of physical distress      Intensity   THRR 40-80% of Max Heartrate 110-143    Ratings of Perceived Exertion 11-13    Perceived Dyspnea 0-4      Progression   Progression Continue to progress workloads to maintain intensity without signs/symptoms of physical distress.      Resistance Training   Training Prescription Yes    Weight 3 lb, 5 lb    Reps 10-15             Perform  Capillary Blood Glucose checks as needed.  Exercise Prescription Changes:   Exercise Prescription Changes     Row Name 01/19/23 1500 01/26/23 1600 01/31/23 1400 02/07/23 1400 02/22/23 0800     Response to Exercise   Blood Pressure (Admit) 120/78 122/64 -- 124/72 116/62   Blood Pressure (Exercise) 134/74 138/62 -- 140/68 112/60   Blood Pressure (Exit) 112/68 102/64 -- 120/78 110/60   Heart Rate (Admit) 78 bpm 75 bpm -- 70 bpm 93 bpm   Heart Rate (Exercise) 107 bpm 97 bpm -- 123 bpm 114 bpm   Heart Rate (Exit) 81 bpm 74 bpm -- 79 bpm 100 bpm   Oxygen Saturation (Admit) 97 % -- -- -- --   Oxygen Saturation (Exercise) 95 % -- -- -- --   Oxygen Saturation (Exit) 97 % -- -- -- --   Rating of Perceived Exertion (Exercise) 11 15 -- 15 13   Perceived Dyspnea (Exercise) 1 -- -- -- --   Symptoms Neck pain 3/10, Feet Pain 4/10 none -- none none   Comments Results First full day of exercise -- -- --   Duration -- Continue with 30 min of aerobic exercise without signs/symptoms of physical distress. -- Continue with 30 min of aerobic exercise without signs/symptoms of physical distress. Continue with 30 min of aerobic exercise without signs/symptoms of physical distress.   Intensity -- THRR unchanged -- THRR unchanged THRR unchanged     Progression   Progression -- Continue to progress workloads to maintain intensity without signs/symptoms of physical distress. -- Continue to progress workloads to maintain intensity without signs/symptoms of physical distress. Continue to progress workloads to maintain intensity without signs/symptoms of physical distress.   Average METs -- 2.99 -- 3.26 3.38     Resistance Training   Training Prescription -- Yes -- Yes Yes   Weight -- 3 lb, 5 lb -- 3 lb, 5 lb 3 lb   Reps -- 10-15 -- 10-15 10-15     Interval Training   Interval Training -- No -- No No     Treadmill   MPH -- 2.2 -- 2.2 --   Grade -- 1 -- 1 --   Minutes -- 15 -- 15 --   METs -- 2.99 --  2.99 --     Recumbant Bike   Level -- -- -- 4 4   Watts -- -- -- 30 32   Minutes -- -- -- 15 15   METs -- -- -- -- 2.62     REL-XR   Level -- 2 -- 3 3   Minutes -- 15 -- 15 15   METs -- -- -- 3.6 3.6     Track   Laps -- -- -- 52 42   Minutes -- -- -- 15 15   METs -- -- -- 3.83 --     Home Exercise Plan   Plans to continue exercise at -- -- Home (comment)  walking, elliptical Home (comment)  walking, elliptical Home (comment)  walking, elliptical   Frequency -- -- Add 3 additional days to program exercise sessions. Add 3 additional days to program exercise sessions. Add 3 additional days to program exercise sessions.   Initial Home Exercises Provided -- -- 01/31/23 01/31/23 01/31/23     Oxygen   Maintain Oxygen Saturation -- 88% or higher -- 88% or higher 88% or higher    Row Name 03/07/23 1600 03/23/23 1100 04/04/23 1600 04/20/23 1000 05/02/23 1400     Response to Exercise   Blood Pressure (Admit) 102/60 100/62 118/60 132/78 104/60   Blood Pressure (Exercise) 130/80 130/80 -- -- --   Blood Pressure (Exit) 110/60 110/64 120/68 128/76 128/70   Heart Rate (Admit) 83 bpm 86 bpm 82 bpm 93 bpm 108 bpm   Heart Rate (Exercise) 111 bpm 107 bpm 106 bpm 120 bpm 126 bpm   Heart Rate (Exit) 89 bpm 85 bpm 85 bpm 99 bpm 114 bpm   Rating of Perceived Exertion (Exercise) 13 12 11 12 12    Symptoms none none none -- none   Duration Continue with 30 min of aerobic exercise without signs/symptoms of physical distress. Continue with 30 min of aerobic exercise without signs/symptoms of physical distress. Continue with 30 min of aerobic exercise without signs/symptoms of physical distress. Continue with 30 min of aerobic exercise without signs/symptoms of physical distress. Continue with 30 min of aerobic exercise without signs/symptoms of physical distress.   Intensity THRR unchanged THRR unchanged THRR unchanged THRR unchanged THRR unchanged     Progression   Progression Continue to progress  workloads to maintain intensity without signs/symptoms of physical distress. Continue to progress workloads to maintain intensity without signs/symptoms of physical distress. Continue to progress workloads to maintain intensity without signs/symptoms of physical distress. Continue to progress workloads to maintain intensity without signs/symptoms of physical distress. Continue to progress workloads to maintain intensity without signs/symptoms of physical distress.   Average METs 3.56 3.3 3.97 4.31 3.63     Resistance Training   Training Prescription Yes Yes Yes Yes Yes   Weight 3 lb 3 lb 3 lb 7 lb 7 lb   Reps 10-15 10-15 10-15 10-15 10-15     Interval Training   Interval Training No No No No No     Recumbant Bike   Level 5 5 -- 5 --   Watts 34 34 -- 40 --   Minutes 15 15 -- 15 --   METs 3.47 -- -- 3.65 --     NuStep   Level -- -- -- -- 5   Minutes -- -- -- -- 15     REL-XR   Level 4 4 4 6 6    Minutes 15 15 15 15 15    METs 4.3 3.2 3.34 4.6 4.1     Track   Laps 44 43 43 42 46   Minutes 15 15 15  -- --   METs 3.39 3.34 3.34 3.28 3.5     Home Exercise Plan   Plans to continue exercise at Home (comment)  walking, elliptical Home (comment)  walking, elliptical Home (comment)  walking, elliptical Home (comment)  walking, elliptical Home (comment)  walking, elliptical   Frequency Add 3 additional days to program exercise sessions. Add 3 additional days to program exercise sessions. Add 3 additional days to program exercise sessions. Add 3 additional days to program exercise sessions. Add 3 additional days to program exercise sessions.   Initial Home Exercises Provided  01/31/23 01/31/23 01/31/23 01/31/23 01/31/23     Oxygen   Maintain Oxygen Saturation 88% or higher 88% or higher 88% or higher 88% or higher 88% or higher            Exercise Comments:   Exercise Comments     Row Name 01/24/23 1345           Exercise Comments First full day of exercise!  Patient was oriented  to gym and equipment including functions, settings, policies, and procedures.  [name] individual exercise prescription and treatment plan were reviewed.  All starting workloads were established based on the results of the 6 minute walk test done at initial orientation visit.  The plan for exercise progression was also introduced and progression will be customized based on patient's performance and goals.                Exercise Goals and Review:   Exercise Goals     Row Name 01/19/23 1540             Exercise Goals   Increase Physical Activity Yes       Intervention Provide advice, education, support and counseling about physical activity/exercise needs.;Develop an individualized exercise prescription for aerobic and resistive training based on initial evaluation findings, risk stratification, comorbidities and participant's personal goals.       Expected Outcomes Short Term: Attend rehab on a regular basis to increase amount of physical activity.;Long Term: Add in home exercise to make exercise part of routine and to increase amount of physical activity.;Long Term: Exercising regularly at least 3-5 days a week.       Increase Strength and Stamina Yes       Intervention Provide advice, education, support and counseling about physical activity/exercise needs.;Develop an individualized exercise prescription for aerobic and resistive training based on initial evaluation findings, risk stratification, comorbidities and participant's personal goals.       Expected Outcomes Short Term: Increase workloads from initial exercise prescription for resistance, speed, and METs.;Short Term: Perform resistance training exercises routinely during rehab and add in resistance training at home;Long Term: Improve cardiorespiratory fitness, muscular endurance and strength as measured by increased METs and functional capacity ( )       Able to understand and use rate of perceived exertion (RPE) scale Yes        Intervention Provide education and explanation on how to use RPE scale       Expected Outcomes Short Term: Able to use RPE daily in rehab to express subjective intensity level;Long Term:  Able to use RPE to guide intensity level when exercising independently       Able to understand and use Dyspnea scale Yes       Intervention Provide education and explanation on how to use Dyspnea scale       Expected Outcomes Short Term: Able to use Dyspnea scale daily in rehab to express subjective sense of shortness of breath during exertion;Long Term: Able to use Dyspnea scale to guide intensity level when exercising independently       Knowledge and understanding of Target Heart Rate Range (THRR) Yes       Intervention Provide education and explanation of THRR including how the numbers were predicted and where they are located for reference       Expected Outcomes Long Term: Able to use THRR to govern intensity when exercising independently;Short Term: Able to state/look up THRR;Short Term: Able to use daily as guideline for intensity in rehab  Able to check pulse independently Yes       Intervention Provide education and demonstration on how to check pulse in carotid and radial arteries.;Review the importance of being able to check your own pulse for safety during independent exercise       Expected Outcomes Short Term: Able to explain why pulse checking is important during independent exercise;Long Term: Able to check pulse independently and accurately       Understanding of Exercise Prescription Yes       Intervention Provide education, explanation, and written materials on patient's individual exercise prescription       Expected Outcomes Short Term: Able to explain program exercise prescription;Long Term: Able to explain home exercise prescription to exercise independently                Exercise Goals Re-Evaluation :  Exercise Goals Re-Evaluation     Row Name 01/24/23 1345 01/26/23 1625  01/31/23 1441 02/07/23 1433 02/22/23 0825     Exercise Goal Re-Evaluation   Exercise Goals Review Increase Physical Activity;Able to understand and use rate of perceived exertion (RPE) scale;Knowledge and understanding of Target Heart Rate Range (THRR);Understanding of Exercise Prescription;Increase Strength and Stamina;Able to check pulse independently Increase Physical Activity;Increase Strength and Stamina;Understanding of Exercise Prescription Increase Physical Activity;Increase Strength and Stamina;Understanding of Exercise Prescription Increase Physical Activity;Increase Strength and Stamina;Understanding of Exercise Prescription Increase Physical Activity;Increase Strength and Stamina;Understanding of Exercise Prescription   Comments Reviewed RPE scale, THR and program prescription with pt today.  Pt voiced understanding and was given a copy of goals to take home. [name] is off to a good start in the program. During his first day of exercise he was able to walk on the treadmill at a speed of 2.2 mph with a 1% incline. He also did well on the XR at level 2. He tolerated using a 3 lb and a 5 lb hand weight for resistance training as well. We will continue to monitor his progress in the program [name] is already exercising at home on his off days. Reviewed home exercise with pt today.  Pt plans to walk and use elliptical at home for exercise.  Reviewed THR, pulse, RPE, sign and symptoms, pulse oximetery and when to call 911 or MD.  Also discussed weather considerations and indoor options.  Pt voiced understanding. [name] is doing well in the program. He is now walking up to 52 laps on the track and increased his overall average MET level to 3.26 METs. He also improved to level 4 on the recumbent bike and level 3 on the XR. He has continued to use 3 lb hand weights for resistance training as well. We will continue to monitor his progress in the program. [name] is doing well in rehab.  He is up to 32 watts on the  bike and consistently walking a mile in his time on the track.  We will encourage him to try heavier weights as well.  We will continue to monitor his progress.   Expected Outcomes Short: Use RPE daily to regulate intensity.  Long: Follow program prescription in THR. Short: Continue to follow current exercise prescription. Long: Continue to increase MET level and stamina. Short: Continue to exercise at home on off days Long: Continue to follow program prescription Short: Try 4 lb hand weights for resistance training, Long: Conitnue to improve strength and stamina. Short: Increase hand weights Long: Continue to improve stamina    Row Name 02/28/23 1405 03/07/23 1701 03/23/23 1123 03/28/23  1405 04/04/23 1643     Exercise Goal Re-Evaluation   Exercise Goals Review Increase Physical Activity;Increase Strength and Stamina;Understanding of Exercise Prescription Increase Physical Activity;Increase Strength and Stamina;Understanding of Exercise Prescription Increase Physical Activity;Increase Strength and Stamina;Understanding of Exercise Prescription Increase Physical Activity;Increase Strength and Stamina;Understanding of Exercise Prescription Increase Physical Activity;Increase Strength and Stamina;Understanding of Exercise Prescription   Comments [name] is doing well in rehab. He is going to clubhouse at [facility] for exercise and planning to add in swimming soon at the the pool.  He is looking forward to getting into pool to have it go easiser on his joints. [name] continues to do well in rehab. He recently improved to level 4 on the XR and level 5 on the recumbent bike. He also increased his overall average MET level to 3.56 METs. He has consistently walked above 40 laps on the track as well. We will continue to monitor his progress in the program. [name] only attended rehab twice since the last review. He continues to work at level 4 on the XR and level 5 on the recumbent bike. He also has continued to wak  above 40 laps on the track. We will continue to monitor his progress in the program. [name] continues to do well in rehab. Today is working at [employer] level of 5 on the XR (METS 4.1). [name] just got back from the beach and said he was trying to walk while on vacation. But also states he feels a "little slow" after coming back from the beach. He has been walking, using recumbent bike and weights at home 5 days a week. HAS attended 1 sesion in the past 2 weeks. maintianed his workload on the Xr and his laps on the track.  Will continue to monitor his progress and watch his attendance.   Expected Outcomes Short: Get in the pool Long: Continue to exercise independently Short: Continue to progressively increase workloads and push for more laps on the track. Long: Continue to improve strength and stamina. Short: Try level 5 on the XR. Long: Continue to increase overall MET level and stamina. Short: Maintain home exercise on off days of cardiac rehab class Long: Maintain independant exercise routine after graduation STG Attend all scheduled sessions to progress his exercise workloads to work on stamina and strangthe LTG continued exercise progression    Row Name 04/20/23 1057 04/20/23 1059 05/02/23 1444 05/02/23 1453       Exercise Goal Re-Evaluation   Exercise Goals Review Increase Physical Activity;Increase Strength and Stamina;Understanding of Exercise Prescription -- Increase Strength and Stamina Increase Physical Activity;Increase Strength and Stamina;Understanding of Exercise Prescription    Comments Christipher attended 3 of 4 sessions. He did increase his REXR to level 6, maintinaing his other workloads. His RPE is 12. He increased his handweights from Christipher attended 3 of 4 sessions. He did increase his REXR to level 6, maintaining his other workloads. His RPE is 12. He increased his handweights from 3 lb to 7 lbs. Will continue to encourage and monitor exercise  progression. and encourage attendance  every session scheduled. [name] continues to attend rehab sessions. He is using his XR machine at home at level 6 which he says is harder than level 6 at rehab. He has been using the pool as well for exercise. He feels that his strength is improving some although he still wants to improve more. Encouraged to continue exercise program and incr workloads as he is able. [name] continues to do well in rehab. He  increased his laps on the track back up to 46 laps. He also improved to level 5 on the T4 nustep and stayed consistent on the XR at level 6. We will continue to monitor his progress in the program.    Expected Outcomes -- STG Attend all scheduled sessions to progress his exercise workloads to work on stamina and strangthe LTG : Continued exercise progression as tolerated during program and after discharge. Short: Attend all scheduled sessions and try to incr workload  Long: Continue exercise progression as tolerated Short: Try level 7 on the XR. Long: Continue to increase overall METs and stamina.             Discharge Exercise Prescription (Final Exercise Prescription Changes):  Exercise Prescription Changes - 05/02/23 1400       Response to Exercise   Blood Pressure (Admit) 104/60    Blood Pressure (Exit) 128/70    Heart Rate (Admit) 108 bpm    Heart Rate (Exercise) 126 bpm    Heart Rate (Exit) 114 bpm    Rating of Perceived Exertion (Exercise) 12    Symptoms none    Duration Continue with 30 min of aerobic exercise without signs/symptoms of physical distress.    Intensity THRR unchanged      Progression   Progression Continue to progress workloads to maintain intensity without signs/symptoms of physical distress.    Average METs 3.63      Resistance Training   Training Prescription Yes    Weight 7 lb    Reps 10-15      Interval Training   Interval Training No      NuStep   Level 5    Minutes 15      REL-XR   Level 6    Minutes 15    METs 4.1      Track   Laps 46     METs 3.5      Home Exercise Plan   Plans to continue exercise at Home (comment)   walking, elliptical   Frequency Add 3 additional days to program exercise sessions.    Initial Home Exercises Provided 01/31/23      Oxygen   Maintain Oxygen Saturation 88% or higher             Nutrition:  Target Goals: Understanding of nutrition guidelines, daily intake of sodium 1500mg , cholesterol 200mg , calories 30% from fat and 7% or less from saturated fats, daily to have 5 or more servings of fruits and vegetables.  Education: All About Nutrition: -Group instruction provided by verbal, written material, interactive activities, discussions, models, and posters to present general guidelines for heart healthy nutrition including fat, fiber, MyPlate, the role of sodium in heart healthy nutrition, utilization of the nutrition label, and utilization of this knowledge for meal planning. Follow up email sent as well. Written material given at graduation.   Biometrics:  Pre Biometrics - 01/19/23 1542       Pre Biometrics   Height 5' 8.5" (1.74 m)    Weight 163 lb 12.8 oz (74.3 kg)    Waist Circumference 36.5 inches    Hip Circumference 36 inches    Waist to Hip Ratio 1.01 %    BMI (Calculated) 24.54    Single Leg Stand 8.7 seconds              Nutrition Therapy Plan and Nutrition Goals:  Nutrition Therapy & Goals - 01/19/23 1522       Intervention Plan  Intervention Prescribe, educate and counsel regarding individualized specific dietary modifications aiming towards targeted core components such as weight, hypertension, lipid management, diabetes, heart failure and other comorbidities.    Expected Outcomes Short Term Goal: Understand basic principles of dietary content, such as calories, fat, sodium, cholesterol and nutrients.;Short Term Goal: A plan has been developed with personal nutrition goals set during dietitian appointment.;Long Term Goal: Adherence to prescribed nutrition  plan.             Nutrition Assessments:  MEDIFICTS Score Key: ?70 Need to make dietary changes  40-70 Heart Healthy Diet ? 40 Therapeutic Level Cholesterol Diet  Flowsheet Row Cardiac Rehab from 01/19/2023 in [facility] Cardiac and Pulmonary Rehab  Picture Your Plate Total Score on Admission 77      Picture Your Plate Scores: <16 Unhealthy dietary pattern with much room for improvement. 41-50 Dietary pattern unlikely to meet recommendations for good health and room for improvement. 51-60 More healthful dietary pattern, with some room for improvement.  >60 Healthy dietary pattern, although there may be some specific behaviors that could be improved.    Nutrition Goals Re-Evaluation:  Nutrition Goals Re-Evaluation     Row Name 01/31/23 1436 02/28/23 1407 03/28/23 1339 05/02/23 1453       Goals   Nutrition Goal Review dietary information Short: Review heart healthy diet paperwork Long: Meet with dietitian -- --    Comment [name] is off to a good start in rehab.  He feels that he has some knowledge of a heart healthy diet, but he would like to meet with dietitian once they start.  In the mean time, he is trying to get in a good variety and monitor his sodium intake.  He was given a packet to review on heart healthy diet. [name] is doing well in rehab.  He is doing well on his diet and read through material.  He is still following [place] diet and eating more fruits and vegetables.   He feels confident with his diet currently. Pt scheduled RD appt for 7/3 Pt had to cancel 7/3 appt. Rescheduled RD appt for 8/7.    Expected Outcome Short: Review heart healthy diet paperwork Long: Meet with dietitian Short: Set up appt with dietitian Long: Continue with adding in fruits and vegetables Short: Meet with dietician Long: Maintain goals set with dietician Short: Meet with dieatician  Long: Maintain goals set with dietician.             Nutrition Goals Discharge (Final Nutrition Goals  Re-Evaluation):  Nutrition Goals Re-Evaluation - 05/02/23 1453       Goals   Comment Pt had to cancel 7/3 appt. Rescheduled RD appt for 8/7.    Expected Outcome Short: Meet with dieatician  Long: Maintain goals set with dietician.             Psychosocial: Target Goals: Acknowledge presence or absence of significant depression and/or stress, maximize coping skills, provide positive support system. Participant is able to verbalize types and ability to use techniques and skills needed for reducing stress and depression.   Education: Stress, Anxiety, and Depression - Group verbal and visual presentation to define topics covered.  Reviews how body is impacted by stress, anxiety, and depression.  Also discusses healthy ways to reduce stress and to treat/manage anxiety and depression.  Written material given at graduation. Flowsheet Row Cardiac Rehab from 04/20/2023 in [facility] Card[facility] Rehab  Date 02/16/23  Educator KW  Instruction Review Code 1- Verbalizes Understanding  Education: Sleep Hygiene -Provides group verbal and written instruction about how sleep can affect your health.  Define sleep hygiene, discuss sleep cycles and impact of sleep habits. Review good sleep hygiene tips.    Initial Review & Psychosocial Screening:  Initial Psych Review & Screening - 01/04/23 1410       Initial Review   Current issues with Current Sleep Concerns;Current Stress Concerns    Source of Stress Concerns Unable to participate in former interests or hobbies      Family Dynamics   Good Support System? Yes   wife, mother, sister, extended family     Barriers   Psychosocial barriers to participate in program There are no identifiable barriers or psychosocial needs.;The patient should benefit from training in stress management and relaxation.      Screening Interventions   Interventions Encouraged to exercise;To provide support and resources with identified psychosocial  needs;Provide feedback about the scores to participant    Expected Outcomes Short Term goal: Utilizing psychosocial counselor, staff and physician to assist with identification of specific Stressors or current issues interfering with healing process. Setting desired goal for each stressor or current issue identified.;Long Term Goal: Stressors or current issues are controlled or eliminated.;Short Term goal: Identification and review with participant of any Quality of Life or Depression concerns found by scoring the questionnaire.;Long Term goal: The participant improves quality of Life and PHQ9 Scores as seen by post scores and/or verbalization of changes             Quality of Life Scores:   Quality of Life - 01/19/23 1522       Quality of Life   Select Quality of Life      Quality of Life Scores   Health/Function Pre 16.77 %    Socioeconomic Pre 20.38 %    Psych/Spiritual Pre 20.57 %    Family Pre 17.4 %    GLOBAL Post 18.44 %            Scores of 19 and below usually indicate a poorer quality of life in these areas.  A difference of  2-3 points is a clinically meaningful difference.  A difference of 2-3 points in the total score of the Quality of Life Index has been associated with significant improvement in overall quality of life, self-image, physical symptoms, and general health in studies assessing change in quality of life.  PHQ-9: Review Flowsheet  More data exists      02/28/2023 01/19/2023 01/10/2023 12/14/2022 12/31/2021  Depression screen PHQ 2/9  Decreased Interest 0 0 0 0 0  Down, Depressed, Hopeless 0 0 0 0 0  PHQ - 2 Score 0 0 0 0 0  Altered sleeping 2 3 0 1 0  Tired, decreased energy 1 2 - - 0  Change in appetite 0 0 0 0 0  Feeling bad or failure about yourself  0 0 0 1 0  Trouble concentrating 0 0 0 0 0  Moving slowly or fidgety/restless 0 1 0 0 0  Suicidal thoughts 0 0 0 0 0  PHQ-9 Score 3 6 0 2 0  Difficult doing work/chores Very difficult Somewhat  difficult Not difficult at all Somewhat difficult -    Details           Interpretation of Total Score  Total Score Depression Severity:  1-4 = Minimal depression, 5-9 = Mild depression, 10-14 = Moderate depression, 15-19 = Moderately severe depression, 20-27 = Severe depression   Psychosocial Evaluation and  Intervention:  Psychosocial Evaluation - 01/04/23 1410       Psychosocial Evaluation & Interventions   Interventions Encouraged to exercise with the program and follow exercise prescription    Comments [name] is coming to 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 He has a history of spine and neck issues and he can't take his usual medication due to his newer cardiac meds. This is stressful for him and he is tired of being in pain. He has a history of sleep concerns, but his sleep habits have gotten worse since his MI. He wakes up around 3 and can't go back to sleep until around 7am. He states his doctors can't give him a clear answer as to why. He did has his PCP about trying CBD oil for his peripheral neuropathy and sleep and she cleared him to do so. He is hopeful the CBD and getting more active in the program will help him sleep better. He can't stand for a long period of time related to his spinal issues and is currently not working. He is hopeful the program will help him understand his cardiac health and develop an exercise routine that fits his needs.    Expected Outcomes Short: attend cardiac rehab for education and exercise. Long: develop and maintain positive self care habits.    Continue Psychosocial Services  Follow up required by staff             Psychosocial Re-Evaluation:  Psychosocial Re-Evaluation     Row Name 01/31/23 1415 02/28/23 1409 03/28/23 1413 05/02/23 1504       Psychosocial Re-Evaluation   Current issues with Current Stress Concerns;Current Depression Current Stress Concerns;Current Depression Current Depression;Current Sleep Concerns Current Anxiety/Panic     Comments [name] is doing well mentally.  He is hoping to meet with therapist soon. Still not sleeping well and has not talked with anyone yet.  He got a name from his pain management clinic to see someone that is used to dealing with more chornic diseases. The CBD oil is helping with his neuropathy some.  He is eager to get started talking to someone to see if it would help. [name] is doing well in rehab.  He is still having sleep issues.  He did talk to his pyschiatrist about sleep and exhaustion.  They felt it may be linked to his Earlyne I[place] so he is supposed to talk to his cardiologist about that.  He is still using the CBD olil to help with and pain and finds that it is helpful some.  His PHQ has improved since last month.  He is looking forward to getting back in the pool to swim and water walk. [name] is doing well in rehab. He just got back from the beach and is excited to have rejoined the pool. He has been swimming again which he is happy to report. He is also glad to report his sleep has improved. His MD increased his current Trazodone dose and this has been successful. [name] expressed significant concerns re anxiety that have worsened recently. He is currently wearing a Zio patch for 2 weeks to assess episodes of waking up at night with palpitations. He reports his sleep has improved greatly, and continues to get 8-9 hours of sleep while taking incr dose of Trazadone. He is not sure what has caused this incr in anxiety but he asked for resourses so he can see a therapist urgently. He has been calling on his own with no success. Offered emergency line to  call now and [place] declined. Mental health resources given so that he can find one to schedule, emergency line card given as well if needed. 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 We discussed other measures he could do to reduce anxiety. He already uses the "Calm" app for mindfullness and he does this daily. He takes Valium as needed, but he had cut it in half in the past,  he is now going to take his prescribed dose. [name] will let us know at next session if he needs any further help findin a therapist and if he has any further needs. He will continue exercise as this supports his mental health goals as well.    Expected Outcomes Short: Call and get appointment set up Long: Conitnue to work on improving sleep and coping with stress. Short: Talk to doctor about sleep Long: Continue to exercise for mental boost Short: Continue swimmng and continue Trazodone  Long: Continue doing what enjoys to imrpove health, maintain good sleep habits Short: Find therapist and schedule appointment  Long: Maintain healthy mental state/balance    Interventions Encouraged to attend Cardiac Rehabilitation for the exercise;Stress management education -- -- --    Continue Psychosocial Services  Follow up required by staff -- Follow up required by staff Follow up required by staff             Psychosocial Discharge (Final Psychosocial Re-Evaluation):  Psychosocial Re-Evaluation - 05/02/23 1504       Psychosocial Re-Evaluation   Current issues with Current Anxiety/Panic    Comments [name] expressed significant concerns re anxiety that have worsened recently. He is currently wearing a Zio patch for 2 weeks to assess episodes of waking up at night with palpitations. He reports his sleep has improved greatly, and continues to get 8-9 hours of sleep while taking incr dose of Trazadone. He is not sure what has caused this incr in anxiety but he asked for resourses so he can see a therapist urgently. He has been calling on his own with no success. Offered emergency line to call now and [name] declined. Mental health resources given so that he can find one to schedule, emergency line card given as well if needed. 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 We discussed other measures he could do to reduce anxiety. He already uses the "Calm" app for mindfullness and he does this daily. He takes Valium as needed, but he had  cut it in half in the past, he is now going to take his prescribed dose. [name] will let us know at next session if he needs any further help findin a therapist and if he has any further needs. He will continue exercise as this supports his mental health goals as well.    Expected Outcomes Short: Find therapist and schedule appointment  Long: Maintain healthy mental state/balance    Continue Psychosocial Services  Follow up required by staff             Vocational Rehabilitation: Provide vocational rehab assistance to qualifying candidates.   Vocational Rehab Evaluation & Intervention:  Vocational Rehab - 01/04/23 1410       Initial Vocational Rehab Evaluation & Intervention   Assessment shows need for Vocational Rehabilitation No             Education: Education Goals: Education classes will be provided on a variety of topics geared toward better understanding of heart health and risk factor modification. Participant will state understanding/return demonstration of topics presented as noted by education test scores.  Learning Barriers/Preferences:  Learning Barriers/Preferences - 01/04/23 1407       Learning Barriers/Preferences   Learning Barriers None    Learning Preferences None             General Cardiac Education Topics:  AED/CPR: - Group verbal and written instruction with the use of models to demonstrate the basic use of the AED with the basic ABC's of resuscitation.   Anatomy and Cardiac Procedures: - Group verbal and visual presentation and models provide information about basic cardiac anatomy and function. Reviews the testing methods done to diagnose heart disease and the outcomes of the test results. Describes the treatment choices: Medical Management, Angioplasty, or Coronary Bypass Surgery for treating various heart conditions including Myocardial Infarction, Angina, Valve Disease, and Cardiac Arrhythmias.  Written material given at  graduation.   Medication Safety: - Group verbal and visual instruction to review commonly prescribed medications for heart and lung disease. Reviews the medication, class of the drug, and side effects. Includes the steps to properly store meds and maintain the prescription regimen.  Written material given at graduation. Flowsheet Row Cardiac Rehab from 04/20/2023 in [facility] Cardiac and Pulmonary Rehab  Date 01/26/23  Educator SB  Instruction Review Code 1- Verbalizes Understanding       Intimacy: - Group verbal instruction through game format to discuss how heart and lung disease can affect sexual intimacy. Written material given at graduation..   Know Your Numbers and Heart Failure: - Group verbal and visual instruction to discuss disease risk factors for cardiac and pulmonary disease and treatment options.  Reviews associated critical values for Overweight/Obesity, Hypertension, Cholesterol, and Diabetes.  Discusses basics of heart failure: signs/symptoms and treatments.  Introduces Heart Failure Zone chart for action plan for heart failure.  Written material given at graduation. Flowsheet Row Cardiac Rehab from 04/20/2023 in [facility] Cardiac and Pulmonary Rehab  Date 02/02/23  Educator MS  Instruction Review Code 1- Verbalizes Understanding       Infection Prevention: - Provides verbal and written material to individual with discussion of infection control including proper hand washing and proper equipment cleaning during exercise session. Flowsheet Row Cardiac Rehab from 04/20/2023 in [facility] Cardiac and Pulmonary Rehab  Date 01/19/23  Educator NT  Instruction Review Code 1- Verbalizes Understanding       Falls Prevention: - Provides verbal and written material to individual with discussion of falls prevention and safety. Flowsheet Row Cardiac Rehab from 04/20/2023 in [facility] Cardiac and Pulmonary Rehab  Date 01/19/23  Educator NT  Instruction Review Code 1- Verbalizes Understanding        Other: -Provides group and verbal instruction on various topics (see comments)   Knowledge Questionnaire Score:  Knowledge Questionnaire Score - 01/19/23 1523       Knowledge Questionnaire Score   Pre Score 24/26             Core Components/Risk Factors/Patient Goals at Admission:  Personal Goals and Risk Factors at Admission - 01/19/23 1523       Core Components/Risk Factors/Patient Goals on Admission    Weight Management Yes;Weight Loss    Intervention Weight Management: Provide education and appropriate resources to help participant work on and attain dietary goals.;Weight Management: Develop a combined nutrition and exercise program designed to reach desired caloric intake, while maintaining appropriate intake of nutrient and fiber, sodium and fats, and appropriate energy expenditure required for the weight goal.;Weight Management/Obesity: Establish reasonable short term and long term weight goals.    Admit Weight 163 lb 12.8 oz (  74.3 kg)    Goal Weight: Short Term 158 lb (71.7 kg)    Goal Weight: Long Term 153 lb (69.4 kg)    Expected Outcomes Long Term: Adherence to nutrition and physical activity/exercise program aimed toward attainment of established weight goal;Short Term: Continue to assess and modify interventions until short term weight is achieved;Weight Loss: Understanding of general recommendations for a balanced deficit meal plan, which promotes 1-2 lb weight loss per week and includes a negative energy balance of [phone] kcal/d;Understanding recommendations for meals to include 15-35% energy as protein, 25-35% energy from fat, 35-60% energy from carbohydrates, less than 200mg  of dietary cholesterol, 20-35 gm of total fiber daily;Understanding of distribution of calorie intake throughout the day with the consumption of 4-5 meals/snacks    Hypertension Yes    Intervention Provide education on lifestyle modifcations including regular physical activity/exercise,  weight management, moderate sodium restriction and increased consumption of fresh fruit, vegetables, and low fat dairy, alcohol moderation, and smoking cessation.;Monitor prescription use compliance.    Expected Outcomes Short Term: Continued assessment and intervention until BP is < 140/52mm HG in hypertensive participants. < 130/49mm HG in hypertensive participants with diabetes, heart failure or chronic kidney disease.;Long Term: Maintenance of blood pressure at goal levels.    Lipids Yes    Intervention Provide education and support for participant on nutrition & aerobic/resistive exercise along with prescribed medications to achieve LDL 70mg , HDL >40mg .    Expected Outcomes Short Term: Participant states understanding of desired cholesterol values and is compliant with medications prescribed. Participant is following exercise prescription and nutrition guidelines.;Long Term: Cholesterol controlled with medications as prescribed, with individualized exercise RX and with personalized nutrition plan. Value goals: LDL < 70mg , HDL > 40 mg.             Education:Diabetes - Individual verbal and written instruction to review signs/symptoms of diabetes, desired ranges of glucose level fasting, after meals and with exercise. Acknowledge that pre and post exercise glucose checks will be done for 3 sessions at entry of program.   Core Components/Risk Factors/Patient Goals Review:   Goals and Risk Factor Review     Row Name 01/31/23 1417 02/28/23 1407 03/28/23 1343 05/02/23 1454       Core Components/Risk Factors/Patient Goals Review   Personal Goals Review Weight Management/Obesity;Hypertension Weight Management/Obesity;Hypertension Weight Management/Obesity;Hypertension Weight Management/Obesity    Review [name] is off to a good start in rehab.  He would like to work on losing weight and has already started on that trend.  His wife is helping to keep an eye on his pressures and they check in on  occassion at home. [name] is doing well in rehab. His pressures are doing well and his weight is up a little recently but steady for most part.  He is pleased with his progress. [name] continues to do well in rehab. His BP has remained well controlled per today at 118/62 and per pt report. His wt today 167. He does not have specific weight loss goals but does want to maintain, build strenghth and stamina. He feels his strength is improving. [name] still has concerns that his Brilinta is causing fatigue. He notices fatigue even after he sleeps well. Discussed with [name] talking to his cardiologist about possible medication changes if related to symptoms. [name] continues to do well in rehab. His goal weight is 165. He says that he feels his best at this weight. Weight today was 168 lb. [name] had questions regarding his cooking that he is  going to speak with RD about in his appt. He will work on his diet and continue to follow exercise goals to reach goal weight.    Expected Outcomes Short: Continue to work on weight loss Long: Continue to monitor risk factors Short; COnitnue to work on [employer] maintenance Long: Continue to monitor risk factors Short: Talk to cardiologist about possible s/e of Brlinta  Long: Continue to monitor risk factors Short: Meet with RD as scheduled  Long: Continue to follow exercise Rx             Core Components/Risk Factors/Patient Goals at Discharge (Final Review):   Goals and Risk Factor Review - 05/02/23 1454       Core Components/Risk Factors/Patient Goals Review   Personal Goals Review Weight Management/Obesity    Review [name] continues to do well in rehab. His goal weight is 165. He says that he feels his best at this weight. Weight today was 168 lb. [name] had questions regarding his cooking that he is going to speak with RD about in his appt. He will work on his diet and continue to follow exercise goals to reach goal weight.    Expected Outcomes Short: Meet with RD as  scheduled  Long: Continue to follow exercise Rx             ITP Comments:  ITP Comments     Row Name 01/04/23 1423 01/19/23 1520 01/24/23 1345 02/09/23 0920 03/09/23 1113   ITP Comments Initial phone call completed. Diagnosis can be found in [facility] 3/15. EP Orientation scheduled for Wednesday 4/24 at 1:30pm. Completed and gym orientation. Initial ITP created and sent for review to Dr. Bethann Punches, Medical Director. First full day of exercise!  Patient was oriented to gym and equipment including functions, settings, policies, and procedures.  [name] individual exercise prescription and treatment plan were reviewed.  All starting workloads were established based on the results of the 6 minute walk test done at initial orientation visit.  The plan for exercise progression was also introduced and progression will be customized based on patient's performance and goals. 30 Day review completed. Medical Director ITP review done, changes made as directed, and signed approval by Medical Director.   new to program 30 Day review completed. Medical Director ITP review done, changes made as directed, and signed approval by Medical Director.    Row Name 04/05/23 1446 05/04/23 1155         ITP Comments 30 Day review completed. Medical Director ITP review done, changes made as directed, and signed approval by Medical Director. 30 Day review completed. Medical Director ITP review done, changes made as directed, and signed approval by Medical Director.               Comments:

## 2023-05-04 NOTE — Progress Notes (Signed)
                Maycel Riffe, LCSW 

## 2023-05-10 DIAGNOSIS — F322 Major depressive disorder, single episode, severe without psychotic features: Secondary | ICD-10-CM | POA: Diagnosis not present

## 2023-05-11 DIAGNOSIS — M25512 Pain in left shoulder: Secondary | ICD-10-CM | POA: Diagnosis not present

## 2023-05-11 DIAGNOSIS — G5752 Tarsal tunnel syndrome, left lower limb: Secondary | ICD-10-CM | POA: Diagnosis not present

## 2023-05-11 DIAGNOSIS — M5412 Radiculopathy, cervical region: Secondary | ICD-10-CM | POA: Diagnosis not present

## 2023-05-11 DIAGNOSIS — G894 Chronic pain syndrome: Secondary | ICD-10-CM | POA: Diagnosis not present

## 2023-05-12 DIAGNOSIS — I214 Non-ST elevation (NSTEMI) myocardial infarction: Secondary | ICD-10-CM | POA: Diagnosis not present

## 2023-05-12 DIAGNOSIS — I25119 Atherosclerotic heart disease of native coronary artery with unspecified angina pectoris: Secondary | ICD-10-CM | POA: Diagnosis not present

## 2023-05-16 ENCOUNTER — Encounter: Payer: Self-pay | Admitting: *Deleted

## 2023-05-16 ENCOUNTER — Telehealth: Payer: Self-pay | Admitting: *Deleted

## 2023-05-16 NOTE — Telephone Encounter (Signed)
Pt's wife left a voicemail with rehab that [name] did not get to sleep until after 6 am and will not be at rehab today.

## 2023-05-18 ENCOUNTER — Encounter: Payer: Self-pay | Admitting: Cardiology

## 2023-05-18 ENCOUNTER — Telehealth: Payer: Self-pay | Admitting: *Deleted

## 2023-05-18 ENCOUNTER — Encounter: Payer: Self-pay | Admitting: *Deleted

## 2023-05-18 NOTE — Telephone Encounter (Signed)
Monitor was relatively reassuring.  Did not show any arrhythmias.  If he did not tolerate metoprolol, we can try diltiazem XT 120 mg nightly.  Less likely to cause fatigue  Bryan Lemma, MD

## 2023-05-18 NOTE — Telephone Encounter (Signed)
[name] called to let us know he will not be at rehab today. He is still having issues with "waking up feeling like I am having a panic attack" every morning. He completed a zio event monitor and was not given any new recc post results. [name] states he was taken off of metoprolol and he is wondering if that could be related to his "panic attacks" he is feeling every morning. Zio monitor did show 2 non sustained episodes of SVT. Advised pt notify his cardiologist to update with these symptoms. [name] does also see psychiatry for anxiety control. He feels this is possibly d/t coming off metoprolol. He plans to be here for rehab tomorrow (8/22) but will let us know of any changes in the meantime.   [name] also notes that his pain management MD does not want him lifting weights at this time d/t rotator cuff injury in the past, he has had incr neck pain. Advised pt we will make that change to his exercise Rx, he will do range of motion during resistance exercise.   Pt appreciative and has not other needs at this time.

## 2023-05-19 ENCOUNTER — Encounter: Payer: PPO | Admitting: *Deleted

## 2023-05-19 DIAGNOSIS — I214 Non-ST elevation (NSTEMI) myocardial infarction: Secondary | ICD-10-CM | POA: Diagnosis not present

## 2023-05-19 DIAGNOSIS — Z955 Presence of coronary angioplasty implant and graft: Secondary | ICD-10-CM

## 2023-05-19 NOTE — Progress Notes (Signed)
Daily Session Note  Patient Details  Name: [name] MRN: [id number] Date of Birth: June 29, 1961 Referring Provider:   Flowsheet Row Cardiac Rehab from 01/19/2023 in [facility] Cardiac and Pulmonary Rehab  Referring Provider Dr. Bryan Lemma, MD       Encounter Date: 05/19/2023  Check In:  Session Check In - 05/19/23 1410       Check-In   Supervising physician immediately available to respond to emergencies See telemetry face sheet for immediately available ER MD    Location [facility]-Cardiac & Pulmonary Rehab    Staff Present Rory Percy, MS, Exercise Physiologist;Maxon Conetta BS, , Exercise Physiologist;Lizanne Erker Katrinka Blazing, RN, ADN    Virtual Visit No    Medication changes reported     Yes    Comments incr valium dose, started dilitazem 120 mg qhs    Fall or balance concerns reported    No    Warm-up and Cool-down Performed on first and last piece of equipment    Resistance Training Performed Yes    VAD Patient? No    PAD/SET Patient? No      Pain Assessment   Currently in Pain? No/denies                Social History   Tobacco Use  Smoking Status Former  Smokeless Tobacco Never  Tobacco Comments   quit 35 years    Goals Met:  Independence with exercise equipment Exercise tolerated well No report of concerns or symptoms today Strength training completed today  Goals Unmet:  Not Applicable  Comments: Pt able to follow exercise prescription today without complaint.  Will continue to monitor for progression.    Dr. Bethann Punches is Medical Director for [facility] Cardiac Rehabilitation.  Dr. Vida Rigger is Medical Director for [facility] Pulmonary Rehabilitation.

## 2023-05-20 MED ORDER — DILTIAZEM HCL ER COATED BEADS 120 MG PO CP24: 120.0000 mg | ORAL_CAPSULE | Freq: Every evening | ORAL | 3 refills | Status: DC

## 2023-05-23 ENCOUNTER — Encounter: Payer: PPO | Admitting: *Deleted

## 2023-05-23 DIAGNOSIS — I214 Non-ST elevation (NSTEMI) myocardial infarction: Secondary | ICD-10-CM | POA: Diagnosis not present

## 2023-05-23 DIAGNOSIS — Z955 Presence of coronary angioplasty implant and graft: Secondary | ICD-10-CM

## 2023-05-23 NOTE — Progress Notes (Signed)
Daily Session Note  Patient Details  Name: [name] MRN: [id number] Date of Birth: [birth year] Referring Provider:   Flowsheet Row Cardiac Rehab from 01/19/2023 in [facility] Cardiac and Pulmonary Rehab  Referring Provider Dr. Bryan Lemma, MD       Encounter Date: 05/23/2023  Check In:  Session Check In - 05/23/23 1352       Check-In   Supervising physician immediately available to respond to emergencies See telemetry face sheet for immediately available ER MD    Location [facility]-Cardiac & Pulmonary Rehab    Staff Present Cyndia Diver, RN, BSN, Rita Ohara, RN, ADN;Joseph Hood, RCP,RRT,BSRT;Kelly Hayes, BS, ACSM CEP, Exercise Physiologist    Virtual Visit No    Medication changes reported     No    Fall or balance concerns reported    No    Tobacco Cessation No Change    Warm-up and Cool-down Performed on first and last piece of equipment    Resistance Training Performed Yes    VAD Patient? No    PAD/SET Patient? No      Pain Assessment   Currently in Pain? No/denies                Social History   Tobacco Use  Smoking Status Former  Smokeless Tobacco Never  Tobacco Comments   quit 35 years    Goals Met:  Independence with exercise equipment Exercise tolerated well No report of concerns or symptoms today  Goals Unmet:  Not Applicable  Comments: Pt able to follow exercise prescription today without complaint.  Will continue to monitor for progression.    Dr. Bethann Punches is Medical Director for [facility] Cardiac Rehabilitation.  Dr. Vida Rigger is Medical Director for [facility] Pulmonary Rehabilitation.

## 2023-05-25 ENCOUNTER — Telehealth: Payer: Self-pay | Admitting: Cardiology

## 2023-05-25 NOTE — Telephone Encounter (Signed)
Patient notified. He will stop the medication and will schedule with APP

## 2023-05-25 NOTE — Telephone Encounter (Signed)
Okay then to stop the medicine and want to figure things out.  He should just be scheduled to come in and discuss it with somebody so we can try to figure things out.   Bryan Lemma, MD

## 2023-05-25 NOTE — Telephone Encounter (Signed)
Pt c/o medication issue:  1. Name of Medication: diltiazem (CARDIZEM CD) 120 MG 24 hr capsule   2. How are you currently taking this medication (dosage and times per day)?    3. Are you having a reaction (difficulty breathing--STAT)? no  4. What is your medication issue? Patient is having a hard time breathing with medication. Calling to see what other options are there.l Please advise

## 2023-05-25 NOTE — Telephone Encounter (Signed)
Patient states having difficulty breathing since taking diltiazem this Monday night.  He states "waking up with weird things" referring to heart racing.   He states  the new med it is causing fatigue and breathing issues.  He states having to take "gulps of air".  The breathing is even at rest, states he is "gasping" like he is running out of air.  He states it started as soon as he got up Tuesday after taking the med the night before. States outside of both feet swelling noted while we talked.  Weight 167 on Monday.  Weight while on phone 166.8 Please advise on any changes ER precautions

## 2023-06-01 ENCOUNTER — Encounter: Payer: Self-pay | Admitting: *Deleted

## 2023-06-01 DIAGNOSIS — I214 Non-ST elevation (NSTEMI) myocardial infarction: Secondary | ICD-10-CM

## 2023-06-01 DIAGNOSIS — Z955 Presence of coronary angioplasty implant and graft: Secondary | ICD-10-CM

## 2023-06-01 NOTE — Progress Notes (Signed)
[name] called staff to state he needs to be put on medical hold until his cardiologist appointment on 9/17. He has some questions that need to be answered by his doctor. He also is discussing virtual cardiac rehab with his insurance this afternoon after they mentioned it to him last week. He will call the office after his appointment to decide if he will finish the program or graduate early.

## 2023-06-01 NOTE — Progress Notes (Signed)
Cardiac Individual Treatment Plan  Patient Details  Name: [name] MRN: [id number] Date of Birth: [birth year] Referring Provider:   Flowsheet Row Cardiac Rehab from 01/19/2023 in [facility] Cardiac and Pulmonary Rehab  Referring Provider Dr. Bryan Lemma, MD       Initial Encounter Date:  Flowsheet Row Cardiac Rehab from 01/19/2023 in [facility] Cardiac and Pulmonary Rehab  Date 01/19/23       Visit Diagnosis: NSTEMI (non-ST elevated myocardial infarction) [facility])  Status post coronary artery stent placement  [name] Home Medications on Admission:  Current Outpatient Medications:    acyclovir (ZOVIRAX) 800 MG tablet, Take 1 tablet (800 mg total) by mouth 2 (two) times daily. (Patient not taking: Reported on 01/17/2023), Disp: 180 tablet, Rfl: 3   amLODipine (NORVASC) 10 MG tablet, Take 1 tablet (10 mg total) by mouth every evening., Disp: 90 tablet, Rfl: 1   aspirin EC 81 MG tablet, Take 1 tablet (81 mg total) by mouth daily. Swallow whole., Disp: 30 tablet, Rfl: 12   atorvastatin (LIPITOR) 80 MG tablet, Take 1 tablet (80 mg total) by mouth daily., Disp: 90 tablet, Rfl: 2   baclofen (LIORESAL) 20 MG tablet, Take 1 tablet (20 mg total) by mouth every 8 (eight) hours as needed for muscle spasms., Disp: 60 each, Rfl: 5   cetirizine (ZYRTEC) 10 MG tablet, Take 10 mg by mouth daily. (Patient not taking: Reported on 01/17/2023), Disp: , Rfl:    diazepam (VALIUM) 10 MG tablet, Take 10 mg by mouth 2 (two) times daily., Disp: , Rfl:    diclofenac sodium (VOLTAREN) 1 % GEL, Apply 1 application  topically daily as needed (For pain). (Patient not taking: Reported on 01/17/2023), Disp: , Rfl:    diltiazem (CARDIZEM CD) 120 MG 24 hr capsule, Take 1 capsule (120 mg total) by mouth at bedtime., Disp: 90 capsule, Rfl: 3   EPINEPHrine 0.3 mg/0.3 mL IJ SOAJ injection, USE AS DIRECTED (Patient not taking: Reported on 01/17/2023), Disp: 2 each, Rfl: 1   fentaNYL (DURAGESIC - DOSED MCG/HR) 50 MCG/HR, Place 50  mcg onto the skin every other day., Disp: , Rfl:    fluticasone (FLONASE) 50 MCG/ACT nasal spray, USE TWO SPRAYS IN EACH NOSTRIL DAILY (Patient not taking: Reported on 01/17/2023), Disp: 16 g, Rfl: 11   lactulose, encephalopathy, (CHRONULAC) 10 GM/15ML SOLN, Take 20 g by mouth at bedtime., Disp: , Rfl:    metoprolol succinate (TOPROL-XL) 25 MG 24 hr tablet, Take 0.5 tablets (12.5 mg total) by mouth at bedtime for 7 days. Then stop taking medication, Disp: , Rfl: 0   nitroGLYCERIN (NITROSTAT) 0.4 MG SL tablet, Place 1 tablet (0.4 mg total) under the tongue every 5 (five) minutes as needed for chest pain., Disp: 100 tablet, Rfl: 3   oxyCODONE (OXY IR/ROXICODONE) 5 MG immediate release tablet, Take 5 mg by mouth every 6 (six) hours as needed for moderate pain. (Patient not taking: Reported on 01/17/2023), Disp: , Rfl: 0   promethazine (PHENERGAN) 25 MG tablet, Take 12.5 mg by mouth every 6 (six) hours as needed for nausea or vomiting., Disp: , Rfl:    scopolamine (TRANSDERM SCOP, 1.5 MG,) 1 MG/3DAYS, Place 1 patch (1.5 mg total) onto the skin every 3 (three) days., Disp: 10 patch, Rfl: 0   ticagrelor (BRILINTA) 90 MG TABS tablet, Take 1 tablet (90 mg total) by mouth 2 (two) times daily., Disp: 60 tablet, Rfl: 11   traZODone (DESYREL) 100 MG tablet, Take 50 mg by mouth at bedtime., Disp: ,  Rfl:    triamcinolone cream (KENALOG) 0.1 %, Apply 1 application topically 2 (two) times daily. (Patient not taking: Reported on 01/17/2023), Disp: 30 g, Rfl: 0  Past Medical History: Past Medical History:  Diagnosis Date   ADHD (attention deficit hyperactivity disorder), inattentive type    sees Dr. Deboraha Sprang in [place], [place]    Anxiety    sees Dr. Deboraha Sprang in [place], [place]    Arthritis    Benign enlargement of prostate    sees [facility]    CAD- 1 Vessel, DES PCI LCx. (NSTEMI) 12/10/2022   LHC 12/10/2022:  Minimal LM & LAD disease. Mid Cx 99% (subtotal occlusion) =>  PCI using a 2.25 x 24 mm Synergy  DES; pRCA 30% & mRCA 50%.      Carpal tunnel syndrome on left    sees Dr. Kathie Dike at [facility]    Chronic pain    sees Dr. Thyra Breed (meds) and Altamease Oiler (accupuncture)    Depression    sees Dr. Deboraha Sprang   Functional impotence    Hyperlipidemia with target LDL less than 70 07/09/2014   Hypogonadism in male    sees Carollee Herter (a [place]) at [facility]    Insomnia    Kidney stones    NSTEMI (non-ST elevated myocardial infarction) (HCC) 12/10/2022   Admitted with stuttering chest pain, troponin elevation. => 9 9% LCx=> DES stent.  Normal EF on echo.   Sinusitis, acute 07/17/2016   Urticaria due to cold     Tobacco Use: Social History   Tobacco Use  Smoking Status Former  Smokeless Tobacco Never  Tobacco Comments   quit 35 years    Labs: Review Flowsheet  More data exists      Latest Ref Rng & Units 04/03/2019 06/05/2020 11/09/2021 12/10/2022 12/11/2022  Labs for ITP Cardiac and Pulmonary Rehab  Cholestrol 0 - 200 mg/dL 161  096  045  409  -  LDL (calc) 0 - 99 mg/dL 811  914  782  956  -  HDL-C >40 mg/dL 21.30  58  86.57  61  -  Trlycerides <150 mg/dL 846.9  629  528.4  71  -  Hemoglobin A1c 4.8 - 5.6 % - 5.8  6.0  - 5.9     Details             Exercise Target Goals: Exercise Program Goal: Individual exercise prescription set using results from initial 6 min walk test and THRR while considering  patient's activity barriers and safety.   Exercise Prescription Goal: Initial exercise prescription builds to 30-45 minutes a day of aerobic activity, 2-3 days per week.  Home exercise guidelines will be given to patient during program as part of exercise prescription that the participant will acknowledge.   Education: Aerobic Exercise: - Group verbal and visual presentation on the components of exercise prescription. Introduces F.I.T.T principle from ACSM for exercise prescriptions.  Reviews F.I.T.T. principles of aerobic exercise including progression. Written  material given at graduation. Flowsheet Row Cardiac Rehab from 04/20/2023 in [facility] Cardiac and Pulmonary Rehab  Education need identified 01/19/23       Education: Resistance Exercise: - Group verbal and visual presentation on the components of exercise prescription. Introduces F.I.T.T principle from ACSM for exercise prescriptions  Reviews F.I.T.T. principles of resistance exercise including progression. Written material given at graduation.    Education: Exercise & Equipment Safety: - Individual verbal instruction and demonstration of equipment use and safety with use of the equipment.  Flowsheet Row Cardiac Rehab from 04/20/2023 in [facility] Cardiac and Pulmonary Rehab  Date 01/19/23  Educator NT  Instruction Review Code 1- Verbalizes Understanding       Education: Exercise Physiology & General Exercise Guidelines: - Group verbal and written instruction with models to review the exercise physiology of the cardiovascular system and associated critical values. Provides general exercise guidelines with specific guidelines to those with heart or lung disease.  Flowsheet Row Cardiac Rehab from 04/20/2023 in [facility] Cardiac and Pulmonary Rehab  Date 02/23/23  Educator [facility]  Instruction Review Code 1- Verbalizes Understanding       Education: Flexibility, Balance, Mind/Body Relaxation: - Group verbal and visual presentation with interactive activity on the components of exercise prescription. Introduces F.I.T.T principle from ACSM for exercise prescriptions. Reviews F.I.T.T. principles of flexibility and balance exercise training including progression. Also discusses the mind body connection.  Reviews various relaxation techniques to help reduce and manage stress (i.e. Deep breathing, progressive muscle relaxation, and visualization). Balance handout provided to take home. Written material given at graduation.   Activity Barriers & Risk Stratification:  Activity Barriers & Cardiac Risk  Stratification - 01/19/23 1536       Activity Barriers & Cardiac Risk Stratification   Activity Barriers Back Problems;Shortness of Breath;Neck/Spine Problems;Other (comment)    Comments Torn rotator cuff last year, neuropathy causes pain in feet    Cardiac Risk Stratification Moderate             6 Minute Walk:  6 Minute Walk     Row Name 01/19/23 1525         6 Minute Walk   Phase Initial     Distance 1750 feet     Walk Time 6 minutes     # of Rest Breaks 0     MPH 3.31     METS 4.52     RPE 11     Perceived Dyspnea  1     VO2 Peak 15.83     Symptoms Yes (comment)     Comments Neck Pain 3/10, Feet Pain 4/10     Resting HR 78 bpm     Resting BP 120/78     Resting Oxygen Saturation  97 %     Exercise Oxygen Saturation  during 6 min walk 95 %     Max Ex. HR 107 bpm     Max Ex. BP 134/74     2 Minute Post BP 112/68              Oxygen Initial Assessment:   Oxygen Re-Evaluation:   Oxygen Discharge (Final Oxygen Re-Evaluation):   Initial Exercise Prescription:  Initial Exercise Prescription - 01/19/23 1500       Date of Initial Exercise RX and Referring Provider   Date 01/19/23    Referring Provider Dr. Bryan Lemma, MD      Oxygen   Maintain Oxygen Saturation 88% or higher      Treadmill   MPH 2.8    Grade 1    Minutes 15    METs 3.53      Recumbant Bike   Level 3    RPM 50    Watts 59    Minutes 15    METs 4.52      NuStep   Level 4    SPM 80    Minutes 15    METs 4.52      REL-XR   Level 2    Speed 50  Minutes 15    METs 4.52      Prescription Details   Frequency (times per week) 2    Duration Progress to 30 minutes of continuous aerobic without signs/symptoms of physical distress      Intensity   THRR 40-80% of Max Heartrate 110-143    Ratings of Perceived Exertion 11-13    Perceived Dyspnea 0-4      Progression   Progression Continue to progress workloads to maintain intensity without signs/symptoms of physical  distress.      Resistance Training   Training Prescription Yes    Weight 3 lb, 5 lb    Reps 10-15             Perform Capillary Blood Glucose checks as needed.  Exercise Prescription Changes:   Exercise Prescription Changes     Row Name 01/19/23 1500 01/26/23 1600 01/31/23 1400 02/07/23 1400 02/22/23 0800     Response to Exercise   Blood Pressure (Admit) 120/78 122/64 -- 124/72 116/62   Blood Pressure (Exercise) 134/74 138/62 -- 140/68 112/60   Blood Pressure (Exit) 112/68 102/64 -- 120/78 110/60   Heart Rate (Admit) 78 bpm 75 bpm -- 70 bpm 93 bpm   Heart Rate (Exercise) 107 bpm 97 bpm -- 123 bpm 114 bpm   Heart Rate (Exit) 81 bpm 74 bpm -- 79 bpm 100 bpm   Oxygen Saturation (Admit) 97 % -- -- -- --   Oxygen Saturation (Exercise) 95 % -- -- -- --   Oxygen Saturation (Exit) 97 % -- -- -- --   Rating of Perceived Exertion (Exercise) 11 15 -- 15 13   Perceived Dyspnea (Exercise) 1 -- -- -- --   Symptoms Neck pain 3/10, Feet Pain 4/10 none -- none none   Comments Results First full day of exercise -- -- --   Duration -- Continue with 30 min of aerobic exercise without signs/symptoms of physical distress. -- Continue with 30 min of aerobic exercise without signs/symptoms of physical distress. Continue with 30 min of aerobic exercise without signs/symptoms of physical distress.   Intensity -- THRR unchanged -- THRR unchanged THRR unchanged     Progression   Progression -- Continue to progress workloads to maintain intensity without signs/symptoms of physical distress. -- Continue to progress workloads to maintain intensity without signs/symptoms of physical distress. Continue to progress workloads to maintain intensity without signs/symptoms of physical distress.   Average METs -- 2.99 -- 3.26 3.38     Resistance Training   Training Prescription -- Yes -- Yes Yes   Weight -- 3 lb, 5 lb -- 3 lb, 5 lb 3 lb   Reps -- 10-15 -- 10-15 10-15     Interval Training   Interval  Training -- No -- No No     Treadmill   MPH -- 2.2 -- 2.2 --   Grade -- 1 -- 1 --   Minutes -- 15 -- 15 --   METs -- 2.99 -- 2.99 --     Recumbant Bike   Level -- -- -- 4 4   Watts -- -- -- 30 32   Minutes -- -- -- 15 15   METs -- -- -- -- 2.62     REL-XR   Level -- 2 -- 3 3   Minutes -- 15 -- 15 15   METs -- -- -- 3.6 3.6     Track   Laps -- -- -- 52 42   Minutes -- -- --  15 15   METs -- -- -- 3.83 --     Home Exercise Plan   Plans to continue exercise at -- -- Home (comment)  walking, elliptical Home (comment)  walking, elliptical Home (comment)  walking, elliptical   Frequency -- -- Add 3 additional days to program exercise sessions. Add 3 additional days to program exercise sessions. Add 3 additional days to program exercise sessions.   Initial Home Exercises Provided -- -- 01/31/23 01/31/23 01/31/23     Oxygen   Maintain Oxygen Saturation -- 88% or higher -- 88% or higher 88% or higher    Row Name 03/07/23 1600 03/23/23 1100 04/04/23 1600 04/20/23 1000 05/02/23 1400     Response to Exercise   Blood Pressure (Admit) 102/60 100/62 118/60 132/78 104/60   Blood Pressure (Exercise) 130/80 130/80 -- -- --   Blood Pressure (Exit) 110/60 110/64 120/68 128/76 128/70   Heart Rate (Admit) 83 bpm 86 bpm 82 bpm 93 bpm 108 bpm   Heart Rate (Exercise) 111 bpm 107 bpm 106 bpm 120 bpm 126 bpm   Heart Rate (Exit) 89 bpm 85 bpm 85 bpm 99 bpm 114 bpm   Rating of Perceived Exertion (Exercise) 13 12 11 12 12    Symptoms none none none -- none   Duration Continue with 30 min of aerobic exercise without signs/symptoms of physical distress. Continue with 30 min of aerobic exercise without signs/symptoms of physical distress. Continue with 30 min of aerobic exercise without signs/symptoms of physical distress. Continue with 30 min of aerobic exercise without signs/symptoms of physical distress. Continue with 30 min of aerobic exercise without signs/symptoms of physical distress.   Intensity  THRR unchanged THRR unchanged THRR unchanged THRR unchanged THRR unchanged     Progression   Progression Continue to progress workloads to maintain intensity without signs/symptoms of physical distress. Continue to progress workloads to maintain intensity without signs/symptoms of physical distress. Continue to progress workloads to maintain intensity without signs/symptoms of physical distress. Continue to progress workloads to maintain intensity without signs/symptoms of physical distress. Continue to progress workloads to maintain intensity without signs/symptoms of physical distress.   Average METs 3.56 3.3 3.97 4.31 3.63     Resistance Training   Training Prescription Yes Yes Yes Yes Yes   Weight 3 lb 3 lb 3 lb 7 lb 7 lb   Reps 10-15 10-15 10-15 10-15 10-15     Interval Training   Interval Training No No No No No     Recumbant Bike   Level 5 5 -- 5 --   Watts 34 34 -- 40 --   Minutes 15 15 -- 15 --   METs 3.47 -- -- 3.65 --     NuStep   Level -- -- -- -- 5   Minutes -- -- -- -- 15     REL-XR   Level 4 4 4 6 6    Minutes 15 15 15 15 15    METs 4.3 3.2 3.34 4.6 4.1     Track   Laps 44 43 43 42 46   Minutes 15 15 15  -- --   METs 3.39 3.34 3.34 3.28 3.5     Home Exercise Plan   Plans to continue exercise at Home (comment)  walking, elliptical Home (comment)  walking, elliptical Home (comment)  walking, elliptical Home (comment)  walking, elliptical Home (comment)  walking, elliptical   Frequency Add 3 additional days to program exercise sessions. Add 3 additional days to program exercise sessions. Add  3 additional days to program exercise sessions. Add 3 additional days to program exercise sessions. Add 3 additional days to program exercise sessions.   Initial Home Exercises Provided 01/31/23 01/31/23 01/31/23 01/31/23 01/31/23     Oxygen   Maintain Oxygen Saturation 88% or higher 88% or higher 88% or higher 88% or higher 88% or higher    Row Name 05/16/23 1000 05/31/23 1400            Response to Exercise   Blood Pressure (Admit) 142/72 102/60      Blood Pressure (Exit) 124/62 100/62      Heart Rate (Admit) 87 bpm 86 bpm      Heart Rate (Exercise) 108 bpm 117 bpm      Heart Rate (Exit) 105 bpm 101 bpm      Oxygen Saturation (Admit) 95 % 98 %      Oxygen Saturation (Exercise) 95 % 95 %      Oxygen Saturation (Exit) 95 % 97 %      Rating of Perceived Exertion (Exercise) 11 12      Symptoms None None      Duration Continue with 30 min of aerobic exercise without signs/symptoms of physical distress. Continue with 30 min of aerobic exercise without signs/symptoms of physical distress.      Intensity THRR unchanged THRR unchanged        Progression   Progression Continue to progress workloads to maintain intensity without signs/symptoms of physical distress. Continue to progress workloads to maintain intensity without signs/symptoms of physical distress.      Average METs 3.49 2.9        Resistance Training   Training Prescription Yes Yes      Weight 7 lb 7 lb      Reps 10-15 10-15        Interval Training   Interval Training No No        Oxygen   Oxygen Continuous --        NuStep   Level 5 6      Minutes 15 15      METs 3.7 3.9        Arm Ergometer   Level -- 4      Minutes -- 15      METs -- 1        Track   Laps 42 45      METs 3.28 3.45        Home Exercise Plan   Plans to continue exercise at Home (comment)  walking, elliptical Home (comment)  walking, elliptical      Frequency Add 3 additional days to program exercise sessions. Add 3 additional days to program exercise sessions.      Initial Home Exercises Provided 01/31/23 01/31/23        Oxygen   Maintain Oxygen Saturation 88% or higher 88% or higher               Exercise Comments:   Exercise Comments     Row Name 01/24/23 1345 05/18/23 1317         Exercise Comments First full day of exercise!  Patient was oriented to gym and equipment including functions,  settings, policies, and procedures.  [name] individual exercise prescription and treatment plan were reviewed.  All starting workloads were established based on the results of the 6 minute walk test done at initial orientation visit.  The plan for exercise progression was also introduced and progression will be customized  based on patient's performance and goals. [name] called to let us know that his pain management MD does not want him lifting weights at this time d/t rotator cuff injury in the past, he has had incr neck pain. Advised pt we will make that change to his exercise Rx, he will do range of motion during resistance exercise.               Exercise Goals and Review:   Exercise Goals     Row Name 01/19/23 1540             Exercise Goals   Increase Physical Activity Yes       Intervention Provide advice, education, support and counseling about physical activity/exercise needs.;Develop an individualized exercise prescription for aerobic and resistive training based on initial evaluation findings, risk stratification, comorbidities and participant's personal goals.       Expected Outcomes Short Term: Attend rehab on a regular basis to increase amount of physical activity.;Long Term: Add in home exercise to make exercise part of routine and to increase amount of physical activity.;Long Term: Exercising regularly at least 3-5 days a week.       Increase Strength and Stamina Yes       Intervention Provide advice, education, support and counseling about physical activity/exercise needs.;Develop an individualized exercise prescription for aerobic and resistive training based on initial evaluation findings, risk stratification, comorbidities and participant's personal goals.       Expected Outcomes Short Term: Increase workloads from initial exercise prescription for resistance, speed, and METs.;Short Term: Perform resistance training exercises routinely during rehab and add in resistance  training at home;Long Term: Improve cardiorespiratory fitness, muscular endurance and strength as measured by increased METs and functional capacity ( )       Able to understand and use rate of perceived exertion (RPE) scale Yes       Intervention Provide education and explanation on how to use RPE scale       Expected Outcomes Short Term: Able to use RPE daily in rehab to express subjective intensity level;Long Term:  Able to use RPE to guide intensity level when exercising independently       Able to understand and use Dyspnea scale Yes       Intervention Provide education and explanation on how to use Dyspnea scale       Expected Outcomes Short Term: Able to use Dyspnea scale daily in rehab to express subjective sense of shortness of breath during exertion;Long Term: Able to use Dyspnea scale to guide intensity level when exercising independently       Knowledge and understanding of Target Heart Rate Range (THRR) Yes       Intervention Provide education and explanation of THRR including how the numbers were predicted and where they are located for reference       Expected Outcomes Long Term: Able to use THRR to govern intensity when exercising independently;Short Term: Able to state/look up THRR;Short Term: Able to use daily as guideline for intensity in rehab       Able to check pulse independently Yes       Intervention Provide education and demonstration on how to check pulse in carotid and radial arteries.;Review the importance of being able to check your own pulse for safety during independent exercise       Expected Outcomes Short Term: Able to explain why pulse checking is important during independent exercise;Long Term: Able to check pulse independently and accurately  Understanding of Exercise Prescription Yes       Intervention Provide education, explanation, and written materials on patient's individual exercise prescription       Expected Outcomes Short Term: Able to explain  program exercise prescription;Long Term: Able to explain home exercise prescription to exercise independently                Exercise Goals Re-Evaluation :  Exercise Goals Re-Evaluation     Row Name 01/24/23 1345 01/26/23 1625 01/31/23 1441 02/07/23 1433 02/22/23 0825     Exercise Goal Re-Evaluation   Exercise Goals Review Increase Physical Activity;Able to understand and use rate of perceived exertion (RPE) scale;Knowledge and understanding of Target Heart Rate Range (THRR);Understanding of Exercise Prescription;Increase Strength and Stamina;Able to check pulse independently Increase Physical Activity;Increase Strength and Stamina;Understanding of Exercise Prescription Increase Physical Activity;Increase Strength and Stamina;Understanding of Exercise Prescription Increase Physical Activity;Increase Strength and Stamina;Understanding of Exercise Prescription Increase Physical Activity;Increase Strength and Stamina;Understanding of Exercise Prescription   Comments Reviewed RPE scale, THR and program prescription with pt today.  Pt voiced understanding and was given a copy of goals to take home. [name] is off to a good start in the program. During his first day of exercise he was able to walk on the treadmill at a speed of 2.2 mph with a 1% incline. He also did well on the XR at level 2. He tolerated using a 3 lb and a 5 lb hand weight for resistance training as well. We will continue to monitor his progress in the program [name] is already exercising at home on his off days. Reviewed home exercise with pt today.  Pt plans to walk and use elliptical at home for exercise.  Reviewed THR, pulse, RPE, sign and symptoms, pulse oximetery and when to call 911 or MD.  Also discussed weather considerations and indoor options.  Pt voiced understanding. [name] is doing well in the program. He is now walking up to 52 laps on the track and increased his overall average MET level to 3.26 METs. He also improved to level  4 on the recumbent bike and level 3 on the XR. He has continued to use 3 lb hand weights for resistance training as well. We will continue to monitor his progress in the program. [name] is doing well in rehab.  He is up to 32 watts on the bike and consistently walking a mile in his time on the track.  We will encourage him to try heavier weights as well.  We will continue to monitor his progress.   Expected Outcomes Short: Use RPE daily to regulate intensity.  Long: Follow program prescription in THR. Short: Continue to follow current exercise prescription. Long: Continue to increase MET level and stamina. Short: Continue to exercise at home on off days Long: Continue to follow program prescription Short: Try 4 lb hand weights for resistance training, Long: Conitnue to improve strength and stamina. Short: Increase hand weights Long: Continue to improve stamina    Row Name 02/28/23 1405 03/07/23 1701 03/23/23 1123 03/28/23 1405 04/04/23 1643     Exercise Goal Re-Evaluation   Exercise Goals Review Increase Physical Activity;Increase Strength and Stamina;Understanding of Exercise Prescription Increase Physical Activity;Increase Strength and Stamina;Understanding of Exercise Prescription Increase Physical Activity;Increase Strength and Stamina;Understanding of Exercise Prescription Increase Physical Activity;Increase Strength and Stamina;Understanding of Exercise Prescription Increase Physical Activity;Increase Strength and Stamina;Understanding of Exercise Prescription   Comments [name] is doing well in rehab. He is going to clubhouse at [facility]  for exercise and planning to add in swimming soon at the the pool.  He is looking forward to getting into pool to have it go easiser on his joints. [name] continues to do well in rehab. He recently improved to level 4 on the XR and level 5 on the recumbent bike. He also increased his overall average MET level to 3.56 METs. He has consistently walked above 40 laps on  the track as well. We will continue to monitor his progress in the program. [name] only attended rehab twice since the last review. He continues to work at level 4 on the XR and level 5 on the recumbent bike. He also has continued to wak above 40 laps on the track. We will continue to monitor his progress in the program. [name] continues to do well in rehab. Today is working at [employer] level of 5 on the XR (METS 4.1). [name] just got back from the beach and said he was trying to walk while on vacation. But also states he feels a "little slow" after coming back from the beach. He has been walking, using recumbent bike and weights at home 5 days a week. HAS attended 1 sesion in the past 2 weeks. maintianed his workload on the Xr and his laps on the track.  Will continue to monitor his progress and watch his attendance.   Expected Outcomes Short: Get in the pool Long: Continue to exercise independently Short: Continue to progressively increase workloads and push for more laps on the track. Long: Continue to improve strength and stamina. Short: Try level 5 on the XR. Long: Continue to increase overall MET level and stamina. Short: Maintain home exercise on off days of cardiac rehab class Long: Maintain independant exercise routine after graduation STG Attend all scheduled sessions to progress his exercise workloads to work on stamina and strangthe LTG continued exercise progression    Row Name 04/20/23 1057 04/20/23 1059 05/02/23 1444 05/02/23 1453 05/16/23 1024     Exercise Goal Re-Evaluation   Exercise Goals Review Increase Physical Activity;Increase Strength and Stamina;Understanding of Exercise Prescription -- Increase Strength and Stamina Increase Physical Activity;Increase Strength and Stamina;Understanding of Exercise Prescription Increase Physical Activity;Increase Strength and Stamina;Understanding of Exercise Prescription   Comments [name] attended 3 of 4 sessions. He did increase his REXR to level 6,  maintinaing his other workloads. His RPE is 12. He increased his handweights from [name] attended 3 of 4 sessions. He did increase his REXR to level 6, maintaining his other workloads. His RPE is 12. He increased his handweights from 3 lb to 7 lbs. Will continue to encourage and monitor exercise  progression. and encourage attendance every session scheduled. [name] continues to attend rehab sessions. He is using his XR machine at home at level 6 which he says is harder than level 6 at rehab. He has been using the pool as well for exercise. He feels that his strength is improving some although he still wants to improve more. Encouraged to continue exercise program and incr workloads as he is able. [name] continues to do well in rehab. He increased his laps on the track back up to 46 laps. He also improved to level 5 on the T4 nustep and stayed consistent on the XR at level 6. We will continue to monitor his progress in the program. [name] continues to do well in rehab. He has only attended one session in the past two weeks. In that one visit he slightly decreased his track laps from  46 to 42, and maintained intensity at level 5 on the T4 nustep.   Expected Outcomes -- STG Attend all scheduled sessions to progress his exercise workloads to work on stamina and strangthe LTG : Continued exercise progression as tolerated during program and after discharge. Short: Attend all scheduled sessions and try to incr workload  Long: Continue exercise progression as tolerated Short: Try level 7 on the XR. Long: Continue to increase overall METs and stamina. Short: Return to program consistently. Long: Continue exercise to improve strength and stamina.    Row Name 05/31/23 1414             Exercise Goal Re-Evaluation   Exercise Goals Review Increase Physical Activity;Increase Strength and Stamina;Understanding of Exercise Prescription       Comments [name] continues to do well in rehab as he returns more regularly. He  has increased his track laps back up to 45 and increased to level 6 on the T4. We will continue to monitor his progress in the program.       Expected Outcomes Short: Continue to progressively increase track laps and T4 workload as he becomes more consistent in rehab. Long: Continue exercise to improve strength and stamina.                Discharge Exercise Prescription (Final Exercise Prescription Changes):  Exercise Prescription Changes - 05/31/23 1400       Response to Exercise   Blood Pressure (Admit) 102/60    Blood Pressure (Exit) 100/62    Heart Rate (Admit) 86 bpm    Heart Rate (Exercise) 117 bpm    Heart Rate (Exit) 101 bpm    Oxygen Saturation (Admit) 98 %    Oxygen Saturation (Exercise) 95 %    Oxygen Saturation (Exit) 97 %    Rating of Perceived Exertion (Exercise) 12    Symptoms None    Duration Continue with 30 min of aerobic exercise without signs/symptoms of physical distress.    Intensity THRR unchanged      Progression   Progression Continue to progress workloads to maintain intensity without signs/symptoms of physical distress.    Average METs 2.9      Resistance Training   Training Prescription Yes    Weight 7 lb    Reps 10-15      Interval Training   Interval Training No      NuStep   Level 6    Minutes 15    METs 3.9      Arm Ergometer   Level 4    Minutes 15    METs 1      Track   Laps 45    METs 3.45      Home Exercise Plan   Plans to continue exercise at Home (comment)   walking, elliptical   Frequency Add 3 additional days to program exercise sessions.    Initial Home Exercises Provided 01/31/23      Oxygen   Maintain Oxygen Saturation 88% or higher             Nutrition:  Target Goals: Understanding of nutrition guidelines, daily intake of sodium 1500mg , cholesterol 200mg , calories 30% from fat and 7% or less from saturated fats, daily to have 5 or more servings of fruits and vegetables.  Education: All About  Nutrition: -Group instruction provided by verbal, written material, interactive activities, discussions, models, and posters to present general guidelines for heart healthy nutrition including fat, fiber, MyPlate, the role of sodium in  heart healthy nutrition, utilization of the nutrition label, and utilization of this knowledge for meal planning. Follow up email sent as well. Written material given at graduation.   Biometrics:  Pre Biometrics - 01/19/23 1542       Pre Biometrics   Height 5' 8.5" (1.74 m)    Weight 163 lb 12.8 oz (74.3 kg)    Waist Circumference 36.5 inches    Hip Circumference 36 inches    Waist to Hip Ratio 1.01 %    BMI (Calculated) 24.54    Single Leg Stand 8.7 seconds              Nutrition Therapy Plan and Nutrition Goals:  Nutrition Therapy & Goals - 01/19/23 1522       Intervention Plan   Intervention Prescribe, educate and counsel regarding individualized specific dietary modifications aiming towards targeted core components such as weight, hypertension, lipid management, diabetes, heart failure and other comorbidities.    Expected Outcomes Short Term Goal: Understand basic principles of dietary content, such as calories, fat, sodium, cholesterol and nutrients.;Short Term Goal: A plan has been developed with personal nutrition goals set during dietitian appointment.;Long Term Goal: Adherence to prescribed nutrition plan.             Nutrition Assessments:  MEDIFICTS Score Key: ?70 Need to make dietary changes  40-70 Heart Healthy Diet ? 40 Therapeutic Level Cholesterol Diet  Flowsheet Row Cardiac Rehab from 01/19/2023 in [facility] Cardiac and Pulmonary Rehab  Picture Your Plate Total Score on Admission 77      Picture Your Plate Scores: <11 Unhealthy dietary pattern with much room for improvement. 41-50 Dietary pattern unlikely to meet recommendations for good health and room for improvement. 51-60 More healthful dietary pattern, with some room  for improvement.  >60 Healthy dietary pattern, although there may be some specific behaviors that could be improved.    Nutrition Goals Re-Evaluation:  Nutrition Goals Re-Evaluation     Row Name 01/31/23 1436 02/28/23 1407 03/28/23 1339 05/02/23 1453       Goals   Nutrition Goal Review dietary information Short: Review heart healthy diet paperwork Long: Meet with dietitian -- --    Comment [name] is off to a good start in rehab.  He feels that he has some knowledge of a heart healthy diet, but he would like to meet with dietitian once they start.  In the mean time, he is trying to get in a good variety and monitor his sodium intake.  He was given a packet to review on heart healthy diet. [name] is doing well in rehab.  He is doing well on his diet and read through material.  He is still following [place] diet and eating more fruits and vegetables.   He feels confident with his diet currently. Pt scheduled RD appt for 7/3 Pt had to cancel 7/3 appt. Rescheduled RD appt for 8/7.    Expected Outcome Short: Review heart healthy diet paperwork Long: Meet with dietitian Short: Set up appt with dietitian Long: Continue with adding in fruits and vegetables Short: Meet with dietician Long: Maintain goals set with dietician Short: Meet with dieatician  Long: Maintain goals set with dietician.             Nutrition Goals Discharge (Final Nutrition Goals Re-Evaluation):  Nutrition Goals Re-Evaluation - 05/02/23 1453       Goals   Comment Pt had to cancel 7/3 appt. Rescheduled RD appt for 8/7.    Expected Outcome Short:  Meet with dieatician  Long: Maintain goals set with dietician.             Psychosocial: Target Goals: Acknowledge presence or absence of significant depression and/or stress, maximize coping skills, provide positive support system. Participant is able to verbalize types and ability to use techniques and skills needed for reducing stress and depression.   Education: Stress,  Anxiety, and Depression - Group verbal and visual presentation to define topics covered.  Reviews how body is impacted by stress, anxiety, and depression.  Also discusses healthy ways to reduce stress and to treat/manage anxiety and depression.  Written material given at graduation. Flowsheet Row Cardiac Rehab from 04/20/2023 in [facility] Cardiac and Pulmonary Rehab  Date 02/16/23  Educator KW  Instruction Review Code 1- [employer] Understanding       Education: Sleep Hygiene -Provides group verbal and written instruction about how sleep can affect your health.  Define sleep hygiene, discuss sleep cycles and impact of sleep habits. Review good sleep hygiene tips.    Initial Review & Psychosocial Screening:  Initial Psych Review & Screening - 01/04/23 1410       Initial Review   Current issues with Current Sleep Concerns;Current Stress Concerns    Source of Stress Concerns Unable to participate in former interests or hobbies      Family Dynamics   Good Support System? Yes   wife, mother, sister, extended family     Barriers   Psychosocial barriers to participate in program There are no identifiable barriers or psychosocial needs.;The patient should benefit from training in stress management and relaxation.      Screening Interventions   Interventions Encouraged to exercise;To provide support and resources with identified psychosocial needs;Provide feedback about the scores to participant    Expected Outcomes Short Term goal: Utilizing psychosocial counselor, staff and physician to assist with identification of specific Stressors or current issues interfering with healing process. Setting desired goal for each stressor or current issue identified.;Long Term Goal: Stressors or current issues are controlled or eliminated.;Short Term goal: Identification and review with participant of any Quality of Life or Depression concerns found by scoring the questionnaire.;Long Term goal: The participant  improves quality of Life and PHQ9 Scores as seen by post scores and/or verbalization of changes             Quality of Life Scores:   Quality of Life - 01/19/23 1522       Quality of Life   Select Quality of Life      Quality of Life Scores   Health/Function Pre 16.77 %    Socioeconomic Pre 20.38 %    Psych/Spiritual Pre 20.57 %    Family Pre 17.4 %    GLOBAL Post 18.44 %            Scores of 19 and below usually indicate a poorer quality of life in these areas.  A difference of  2-3 points is a clinically meaningful difference.  A difference of 2-3 points in the total score of the Quality of Life Index has been associated with significant improvement in overall quality of life, self-image, physical symptoms, and general health in studies assessing change in quality of life.  PHQ-9: Review Flowsheet  More data exists      02/28/2023 01/19/2023 01/10/2023 12/14/2022 12/31/2021  Depression screen PHQ 2/9  Decreased Interest 0 0 0 0 0  Down, Depressed, Hopeless 0 0 0 0 0  PHQ - 2 Score 0 0 0 0  0  Altered sleeping 2 3 0 1 0  Tired, decreased energy 1 2 - - 0  Change in appetite 0 0 0 0 0  Feeling bad or failure about yourself  0 0 0 1 0  Trouble concentrating 0 0 0 0 0  Moving slowly or fidgety/restless 0 1 0 0 0  Suicidal thoughts 0 0 0 0 0  PHQ-9 Score 3 6 0 2 0  Difficult doing work/chores Very difficult Somewhat difficult Not difficult at all Somewhat difficult -    Details           Interpretation of Total Score  Total Score Depression Severity:  1-4 = Minimal depression, 5-9 = Mild depression, 10-14 = Moderate depression, 15-19 = Moderately severe depression, 20-27 = Severe depression   Psychosocial Evaluation and Intervention:  Psychosocial Evaluation - 01/04/23 1410       Psychosocial Evaluation & Interventions   Interventions Encouraged to exercise with the program and follow exercise prescription    Comments [name] is coming to cardiac rehab after his  NSTEMI. He has a history of spine and neck issues and he can't take his usual medication due to his newer cardiac meds. This is stressful for him and he is tired of being in pain. He has a history of sleep concerns, but his sleep habits have gotten worse since his MI. He wakes up around 3 and can't go back to sleep until around 7am. He states his doctors can't give him a clear answer as to why. He did has his PCP about trying CBD oil for his peripheral neuropathy and sleep and she cleared him to do so. He is hopeful the CBD and getting more active in the program will help him sleep better. He can't stand for a long period of time related to his spinal issues and is currently not working. He is hopeful the program will help him understand his cardiac health and develop an exercise routine that fits his needs.    Expected Outcomes Short: attend cardiac rehab for education and exercise. Long: develop and maintain positive self care habits.    Continue Psychosocial Services  Follow up required by staff             Psychosocial Re-Evaluation:  Psychosocial Re-Evaluation     Row Name 01/31/23 1415 02/28/23 1409 03/28/23 1413 05/02/23 1504       Psychosocial Re-Evaluation   Current issues with Current Stress Concerns;Current Depression Current Stress Concerns;Current Depression Current Depression;Current Sleep Concerns Current Anxiety/Panic    Comments [name] is doing well mentally.  He is hoping to meet with therapist soon. Still not sleeping well and has not talked with anyone yet.  He got a name from his pain management clinic to see someone that is used to dealing with more chornic diseases. The CBD oil is helping with his neuropathy some.  He is eager to get started talking to someone to see if it would help. [name] is doing well in rehab.  He is still having sleep issues.  He did talk to his pyschiatrist about sleep and exhaustion.  They felt it may be linked to his Earlyne Iba so he is supposed to talk  to his cardiologist about that.  He is still using the CBD olil to help with and pain and finds that it is helpful some.  His PHQ has improved since last month.  He is looking forward to getting back in the pool to swim and water walk. [name] is  doing well in rehab. He just got back from the beach and is excited to have rejoined the pool. He has been swimming again which he is happy to report. He is also glad to report his sleep has improved. His MD increased his current Trazodone dose and this has been successful. [name] expressed significant concerns re anxiety that have worsened recently. He is currently wearing a Zio patch for 2 weeks to assess episodes of waking up at night with palpitations. He reports his sleep has improved greatly, and continues to get 8-9 hours of sleep while taking incr dose of Trazadone. He is not sure what has caused this incr in anxiety but he asked for resourses so he can see a therapist urgently. He has been calling on his own with no success. Offered emergency line to call now and [name] declined. Mental health resources given so that he can find one to schedule, emergency line card given as well if needed. 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 We discussed other measures he could do to reduce anxiety. He already uses the "Calm" app for mindfullness and he does this daily. He takes Valium as needed, but he had cut it in half in the past, he is now going to take his prescribed dose. [name] will let us know at next session if he needs any further help findin a therapist and if he has any further needs. He will continue exercise as this supports his mental health goals as well.    Expected Outcomes Short: Call and get appointment set up Long: Conitnue to work on improving sleep and coping with stress. Short: Talk to doctor about sleep Long: Continue to exercise for mental boost Short: Continue swimmng and continue Trazodone  Long: Continue doing what enjoys to imrpove health, maintain good sleep habits  Short: Find therapist and schedule appointment  Long: Maintain healthy mental state/balance    Interventions Encouraged to attend Cardiac Rehabilitation for the exercise;Stress management education -- -- --    Continue Psychosocial Services  Follow up required by staff -- Follow up required by staff Follow up required by staff             Psychosocial Discharge (Final Psychosocial Re-Evaluation):  Psychosocial Re-Evaluation - 05/02/23 1504       Psychosocial Re-Evaluation   Current issues with Current Anxiety/Panic    Comments [name] expressed significant concerns re anxiety that have worsened recently. He is currently wearing a Zio patch for 2 weeks to assess episodes of waking up at night with palpitations. He reports his sleep has improved greatly, and continues to get 8-9 hours of sleep while taking incr dose of Trazadone. He is not sure what has caused this incr in anxiety but he asked for resourses so he can see a therapist urgently. He has been calling on his own with no success. Offered emergency line to call now and [name] declined. Mental health resources given so that he can find one to schedule, emergency line card given as well if needed. 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 We discussed other measures he could do to reduce anxiety. He already uses the "Calm" app for mindfullness and he does this daily. He takes Valium as needed, but he had cut it in half in the past, he is now going to take his prescribed dose. [name] will let us know at next session if he needs any further help findin a therapist and if he has any further needs. He will continue exercise as this supports his mental health goals  as well.    Expected Outcomes Short: Find therapist and schedule appointment  Long: Maintain healthy mental state/balance    Continue Psychosocial Services  Follow up required by staff             Vocational Rehabilitation: Provide vocational rehab assistance to qualifying candidates.   Vocational  Rehab Evaluation & Intervention:  Vocational Rehab - 01/04/23 1410       Initial Vocational Rehab Evaluation & Intervention   Assessment shows need for Vocational Rehabilitation No             Education: Education Goals: Education classes will be provided on a variety of topics geared toward better understanding of heart health and risk factor modification. Participant will state understanding/return demonstration of topics presented as noted by education test scores.  Learning Barriers/Preferences:  Learning Barriers/Preferences - 01/04/23 1407       Learning Barriers/Preferences   Learning Barriers None    Learning Preferences None             General Cardiac Education Topics:  AED/CPR: - Group verbal and written instruction with the use of models to demonstrate the basic use of the AED with the basic ABC's of resuscitation.   Anatomy and Cardiac Procedures: - Group verbal and visual presentation and models provide information about basic cardiac anatomy and function. Reviews the testing methods done to diagnose heart disease and the outcomes of the test results. Describes the treatment choices: Medical Management, Angioplasty, or Coronary Bypass Surgery for treating various heart conditions including Myocardial Infarction, Angina, Valve Disease, and Cardiac Arrhythmias.  Written material given at graduation.   Medication Safety: - Group verbal and visual instruction to review commonly prescribed medications for heart and lung disease. Reviews the medication, class of the drug, and side effects. Includes the steps to properly store meds and maintain the prescription regimen.  Written material given at graduation. Flowsheet Row Cardiac Rehab from 04/20/2023 in [facility] Cardiac and Pulmonary Rehab  Date 01/26/23  Educator SB  Instruction Review Code 1- Verbalizes Understanding       Intimacy: - Group verbal instruction through game format to discuss how heart and lung  disease can affect sexual intimacy. Written material given at graduation..   Know Your Numbers and Heart Failure: - Group verbal and visual instruction to discuss disease risk factors for cardiac and pulmonary disease and treatment options.  Reviews associated critical values for Overweight/Obesity, Hypertension, Cholesterol, and Diabetes.  Discusses basics of heart failure: signs/symptoms and treatments.  Introduces Heart Failure Zone chart for action plan for heart failure.  Written material given at graduation. Flowsheet Row Cardiac Rehab from 04/20/2023 in [facility] Cardiac and Pulmonary Rehab  Date 02/02/23  Educator MS  Instruction Review Code 1- Verbalizes Understanding       Infection Prevention: - Provides verbal and written material to individual with discussion of infection control including proper hand washing and proper equipment cleaning during exercise session. Flowsheet Row Cardiac Rehab from 04/20/2023 in [facility] Cardiac and Pulmonary Rehab  Date 01/19/23  Educator NT  Instruction Review Code 1- Verbalizes Understanding       Falls Prevention: - Provides verbal and written material to individual with discussion of falls prevention and safety. Flowsheet Row Cardiac Rehab from 04/20/2023 in [facility] Cardiac and Pulmonary Rehab  Date 01/19/23  Educator NT  Instruction Review Code 1- Verbalizes Understanding       Other: -Provides group and verbal instruction on various topics (see comments)   Knowledge Questionnaire Score:  Knowledge  Questionnaire Score - 01/19/23 1523       Knowledge Questionnaire Score   Pre Score 24/26             Core Components/Risk Factors/Patient Goals at Admission:  Personal Goals and Risk Factors at Admission - 01/19/23 1523       Core Components/Risk Factors/Patient Goals on Admission    Weight Management Yes;Weight Loss    Intervention Weight Management: Provide education and appropriate resources to help participant work on and  attain dietary goals.;Weight Management: Develop a combined nutrition and exercise program designed to reach desired caloric intake, while maintaining appropriate intake of nutrient and fiber, sodium and fats, and appropriate energy expenditure required for the weight goal.;Weight Management/Obesity: Establish reasonable short term and long term weight goals.    Admit Weight 163 lb 12.8 oz (74.3 kg)    Goal Weight: Short Term 158 lb (71.7 kg)    Goal Weight: Long Term 153 lb (69.4 kg)    Expected Outcomes Long Term: Adherence to nutrition and physical activity/exercise program aimed toward attainment of established weight goal;Short Term: Continue to assess and modify interventions until short term weight is achieved;Weight Loss: Understanding of general recommendations for a balanced deficit meal plan, which promotes 1-2 lb weight loss per week and includes a negative energy balance of [id number] kcal/d;Understanding recommendations for meals to include 15-35% energy as protein, 25-35% energy from fat, 35-60% energy from carbohydrates, less than 200mg  of dietary cholesterol, 20-35 gm of total fiber daily;Understanding of distribution of calorie intake throughout the day with the consumption of 4-5 meals/snacks    Hypertension Yes    Intervention Provide education on lifestyle modifcations including regular physical activity/exercise, weight management, moderate sodium restriction and increased consumption of fresh fruit, vegetables, and low fat dairy, alcohol moderation, and smoking cessation.;Monitor prescription use compliance.    Expected Outcomes Short Term: Continued assessment and intervention until BP is < 140/20mm HG in hypertensive participants. < 130/23mm HG in hypertensive participants with diabetes, heart failure or chronic kidney disease.;Long Term: Maintenance of blood pressure at goal levels.    Lipids Yes    Intervention Provide education and support for participant on nutrition &  aerobic/resistive exercise along with prescribed medications to achieve LDL 70mg , HDL >40mg .    Expected Outcomes Short Term: Participant states understanding of desired cholesterol values and is compliant with medications prescribed. Participant is following exercise prescription and nutrition guidelines.;Long Term: Cholesterol controlled with medications as prescribed, with individualized exercise RX and with personalized nutrition plan. Value goals: LDL < 70mg , HDL > 40 mg.             Education:Diabetes - Individual verbal and written instruction to review signs/symptoms of diabetes, desired ranges of glucose level fasting, after meals and with exercise. Acknowledge that pre and post exercise glucose checks will be done for 3 sessions at entry of program.   Core Components/Risk Factors/Patient Goals Review:   Goals and Risk Factor Review     Row Name 01/31/23 1417 02/28/23 1407 03/28/23 1343 05/02/23 1454       Core Components/Risk Factors/Patient Goals Review   Personal Goals Review Weight Management/Obesity;Hypertension Weight Management/Obesity;Hypertension Weight Management/Obesity;Hypertension Weight Management/Obesity    Review [name] is off to a good start in rehab.  He would like to work on losing weight and has already started on that trend.  His wife is helping to keep an eye on his pressures and they check in on occassion at home. [name] is doing well in rehab. His pressures  are doing well and his weight is up a little recently but steady for most part.  He is pleased with his progress. [name] continues to do well in rehab. His BP has remained well controlled per today at 118/62 and per pt report. His wt today 167. He does not have specific weight loss goals but does want to maintain, build strenghth and stamina. He feels his strength is improving. [name] still has concerns that his Brilinta is causing fatigue. He notices fatigue even after he sleeps well. Discussed with [name]  talking to his cardiologist about possible medication changes if related to symptoms. [name] continues to do well in rehab. His goal weight is 165. He says that he feels his best at this weight. Weight today was 168 lb. [name] had questions regarding his cooking that he is going to speak with RD about in his appt. He will work on his diet and continue to follow exercise goals to reach goal weight.    Expected Outcomes Short: Continue to work on weight loss Long: Continue to monitor risk factors Short; COnitnue to work on [employer] maintenance Long: Continue to monitor risk factors Short: Talk to cardiologist about possible s/e of Brlinta  Long: Continue to monitor risk factors Short: Meet with RD as scheduled  Long: Continue to follow exercise Rx             Core Components/Risk Factors/Patient Goals at Discharge (Final Review):   Goals and Risk Factor Review - 05/02/23 1454       Core Components/Risk Factors/Patient Goals Review   Personal Goals Review Weight Management/Obesity    Review [name] continues to do well in rehab. His goal weight is 165. He says that he feels his best at this weight. Weight today was 168 lb. [name] had questions regarding his cooking that he is going to speak with RD about in his appt. He will work on his diet and continue to follow exercise goals to reach goal weight.    Expected Outcomes Short: Meet with RD as scheduled  Long: Continue to follow exercise Rx             ITP Comments:  ITP Comments     Row Name 01/04/23 1423 01/19/23 1520 01/24/23 1345 02/09/23 0920 03/09/23 1113   ITP Comments Initial phone call completed. Diagnosis can be found in [facility] 3/15. EP Orientation scheduled for Wednesday 4/24 at 1:30pm. Completed and gym orientation. Initial ITP created and sent for review to Dr. Bethann Punches, Medical Director. First full day of exercise!  Patient was oriented to gym and equipment including functions, settings, policies, and procedures.  [name]  individual exercise prescription and treatment plan were reviewed.  All starting workloads were established based on the results of the 6 minute walk test done at initial orientation visit.  The plan for exercise progression was also introduced and progression will be customized based on patient's performance and goals. 30 Day review completed. Medical Director ITP review done, changes made as directed, and signed approval by Medical Director.   new to program 30 Day review completed. Medical Director ITP review done, changes made as directed, and signed approval by Medical Director.    Row Name 04/05/23 1446 05/04/23 1155 05/16/23 1659 05/18/23 1316 06/01/23 1103   ITP Comments 30 Day review completed. Medical Director ITP review done, changes made as directed, and signed approval by Medical Director. 30 Day review completed. Medical Director ITP review done, changes made as directed, and signed approval by  Medical Director. Pt's wife left a voicemail with rehab that [name] did not get to sleep until after 6 am and will not be at rehab today. [name] called to let us know he will not be at rehab today. He is still having issues with "waking up feeling like I am having a panic attack" every morning. He completed a zio event monitor and was not given any new recc post results. [name] states he was taken off of metoprolol and he is wondering if that could be related to his "panic attacks" he is feeling every morning. Zio monitor did show 2 non sustained episodes of SVT. Advised pt notify his cardiologist to update with these symptoms. [name] does also see psychiatry for anxiety control. He feels this is possibly d/t coming off metoprolol. He plans to be here for rehab tomorrow (8/22) but will let us know of any changes in the meantime.     [name] also notes that his pain management MD does not want him lifting weights at this time d/t rotator cuff injury in the past, he has had incr neck pain. Advised pt we will make  that change to his exercise Rx, he will do range of motion during resistance exercise. 30 Day review completed. Medical Director ITP review done, changes made as directed, and signed approval by Medical Director.            Comments:

## 2023-06-03 ENCOUNTER — Ambulatory Visit (INDEPENDENT_AMBULATORY_CARE_PROVIDER_SITE_OTHER): Payer: PPO | Admitting: Clinical

## 2023-06-03 DIAGNOSIS — F419 Anxiety disorder, unspecified: Secondary | ICD-10-CM | POA: Diagnosis not present

## 2023-06-03 NOTE — Progress Notes (Signed)
[facility] Counselor/Therapist Progress Note  Patient ID: [name], MRN: [id number]    Date: 06/03/23  Time Spent: 1:32  pm - 2:17 pm : 45 Minutes  Treatment Type: Individual Therapy.  Reported Symptoms: Patient reported recent symptoms of difficulty breathing and reported feeling anxious.   Mental Status Exam: Appearance:  Neat and Well Groomed     Behavior: Appropriate  Motor: Normal  Speech/Language:  Clear and Coherent  Affect: Appropriate  Mood: anxious  Thought process: normal  Thought content:   WNL  Sensory/Perceptual disturbances:   WNL  Orientation: oriented to person, place, and situation  Attention: Good  Concentration: Good  Memory: WNL  Fund of knowledge:  Good  Insight:   Good  Judgment:  Good  Impulse Control: Good   Risk Assessment: Danger to Self:  No Patient denied current suicidal ideation  Self-injurious Behavior: No Danger to Others: No Patient denied current homicidal ideation Duty to Warn:no Physical Aggression / Violence:No  Access to Firearms a concern: No  Gang Involvement:No   Subjective:  Patient stated, "not good" in response to events since last session.  Patient reported he is not able to obtain an appointment with his cardiologist until the middle of September. Patient reported he recently experienced difficulty breathing. Patient reported he felt anxious when he experienced difficulty breathing. Patient stated, "I have not felt like myself since my heart attack". Patient stated, "not a lot of energy, tired".  Patient reported he is scheduled for a follow up appointment with his psychiatrist in approximately October/November. Patient reported historical diagnoses of ADHD and anxiety. Patient stated, "I use to" in response to worry. Patient reported he meditates using an app on his phone and stated, "I have found it to be helpful". Patient reported his participation in the [facility] board continues to be a stressor. Patient  reported he is open to participation in therapy. Patient stated, "id have to think about that" in response to goals for therapy.   Interventions: Motivational Interviewing. Clinician conducted session via caregility video from clinician's home office. Patient provided verbal consent to proceed with telehealth session and is aware of limitations of telephone or video visits. Patient participated in session from patient's home. Reviewed events since last session and current stressors. Clinician reviewed diagnosis and treatment recommendations. Clinician utilized clarification to better understand patient's experience/symptoms as it relates to anxiety. Provided psycho education related to diagnosis and treatment. Clinician utilized motivational interviewing to explore potential goals for therapy.   Collaboration of Care: Other not required at this time  Diagnosis:  Anxiety disorder, unspecified type   Plan: Goals to be completed during follow up appointment on 06/27/23.     Doree Barthel, LCSW

## 2023-06-14 ENCOUNTER — Ambulatory Visit: Payer: PPO | Attending: Physician Assistant | Admitting: Physician Assistant

## 2023-06-14 ENCOUNTER — Encounter: Payer: Self-pay | Admitting: Physician Assistant

## 2023-06-14 VITALS — BP 132/90 | HR 67 | Ht 69.0 in | Wt 169.8 lb

## 2023-06-14 DIAGNOSIS — I2510 Atherosclerotic heart disease of native coronary artery without angina pectoris: Secondary | ICD-10-CM

## 2023-06-14 DIAGNOSIS — R002 Palpitations: Secondary | ICD-10-CM | POA: Diagnosis not present

## 2023-06-14 DIAGNOSIS — E785 Hyperlipidemia, unspecified: Secondary | ICD-10-CM

## 2023-06-14 NOTE — Patient Instructions (Signed)
Medication Instructions:  NO CHANGES *If you need a refill on your cardiac medications before your next appointment, please call your pharmacy*   Lab Work: NO LABS If you have labs (blood work) drawn today and your tests are completely normal, you will receive your results only by: MyChart Message (if you have MyChart) OR A paper copy in the mail If you have any lab test that is abnormal or we need to change your treatment, we will call you to review the results.   Testing/Procedures: NO TESTING   Follow-Up: At [facility], you and your health needs are our priority.  As part of our continuing mission to provide you with exceptional heart care, we have created designated Provider Care Teams.  These Care Teams include your primary Cardiologist (physician) and Advanced Practice Providers (APPs -  Physician Assistants and Nurse Practitioners) who all work together to provide you with the care you need, when you need it.   Your next appointment:   KEEP UPCOMING APPOINTMENT IN OCTOBER 2024  Provider:   Joni Reining, NP

## 2023-06-14 NOTE — Progress Notes (Unsigned)
The lesion is eccentric.  Intervention  Mid Cx lesion Stent CATH LAUNCHER 6FR EBU3.5 guide catheter was inserted. Lesion crossed with guidewire using a WIRE COUGAR XT STRL 190CM. Pre-stent angioplasty was performed using a BALLN SAPPHIRE 2.0X12. A drug-eluting stent was successfully  placed using a SYNERGY XD 2.25X24. Post-stent angioplasty was performed using a BALL SAPPHIRE NC24 2.50X15. Maximum pressure:  20 atm. Post-Intervention Lesion Assessment The intervention was successful. Pre-interventional TIMI flow is 3. Post-intervention TIMI flow is 3. No complications occurred at this lesion. There is a 0% residual stenosis post intervention.     ECHOCARDIOGRAM  ECHOCARDIOGRAM COMPLETE 12/11/2022  Narrative ECHOCARDIOGRAM REPORT    Patient Name:   [name] Date of Exam: 12/11/2022 Medical Rec #:  [id number]            Height:       69.0 in Accession #:    [id number]           Weight:       165.0 lb Date of Birth:  29-Jun-1961            BSA:          1.904 m Patient Age:    61 years             BP:           119/91 mmHg Patient Gender: M                    HR:           81 bpm. Exam Location:  Inpatient  Procedure: 2D Echo, Cardiac Doppler and Color Doppler  Indications:    Chest Pain  History:        Patient has no prior history of Echocardiogram examinations. Signs/Symptoms:Chest Pain; Risk Factors:Hypertension.  Sonographer:    Lucy Antigua Referring Phys: Clydie Braun  IMPRESSIONS   1. Left ventricular ejection fraction, by estimation, is 60 to 65%. The left ventricle has normal function. The left ventricle has no regional wall motion abnormalities. Left ventricular diastolic parameters are consistent with Grade I diastolic dysfunction (impaired relaxation). 2. Right ventricular systolic function is normal. The right ventricular size is normal. There is normal pulmonary artery systolic pressure. 3. No evidence of mitral valve regurgitation. 4. Aortic valve regurgitation is not visualized. 5. The inferior vena cava is normal in size with greater than 50% respiratory variability, suggesting right atrial pressure of 3 mmHg.  Comparison(s): No prior Echocardiogram.  Conclusion(s)/Recommendation(s): Normal biventricular function without  evidence of hemodynamically significant valvular heart disease.  FINDINGS Left Ventricle: Left ventricular ejection fraction, by estimation, is 60 to 65%. The left ventricle has normal function. The left ventricle has no regional wall motion abnormalities. The left ventricular internal cavity size was normal in size. There is no left ventricular hypertrophy. Left ventricular diastolic parameters are consistent with Grade I diastolic dysfunction (impaired relaxation).  Right Ventricle: The right ventricular size is normal. Right ventricular systolic function is normal. There is normal pulmonary artery systolic pressure. The tricuspid regurgitant velocity is 1.96 m/s, and with an assumed right atrial pressure of 3 mmHg, the estimated right ventricular systolic pressure is 18.4 mmHg.  Left Atrium: Left atrial size was normal in size.  Right Atrium: Right atrial size was normal in size.  Pericardium: There is no evidence of pericardial effusion.  Mitral Valve: No evidence of mitral valve regurgitation.  Tricuspid Valve: Tricuspid valve regurgitation is not demonstrated.  Aortic Valve: Aortic valve regurgitation is  Cardiology Office Note:  .   Date:  06/16/2023  ID:  Violet Baldy, DOB [birth year], MRN [id number] PCP: Nelwyn Salisbury, MD  [facility] Providers Cardiologist:  Bryan Lemma, MD     History of Present Illness: .   [name] is a 62 y.o. male with PMH of CAD, anxiety/depression, chronic pain syndrome, ADHD, tobacco abuse and hyperlipidemia.  He has multiple allergies.  He was admitted in March 2024 with NSTEMI.  Subsequent cardiac catheterization revealed patent left main and LAD, eccentric nonobstructive mid RCA stenosis with diffuse plaquing, severe subtotal 99% stenosis in mid left circumflex artery with sequential lesion into the branching OM, this was treated with 2.25 x 24 mm Synergy DES.  EF was 55%.  Echocardiogram obtained on 12/11/2022 showed EF 60 to 65%, grade 1 DD, no regional wall motion abnormality, no significant valve issue.  Postprocedure, patient was placed on aspirin and Brilinta.  He was last seen by Dr. Herbie Baltimore in April at which time he had a lot of atypical symptoms such as fatigue and chest discomfort.  It was suspected some of his symptoms may be related to stress and anxiety.  He has significant fatigue on the metoprolol, metoprolol was eventually discontinued in August.  Heart monitor in August shows no significant arrhythmia despite his complaint of palpitation.  He was started on diltiazem which he could not tolerate due to leg swelling.  Patient presents today for follow-up.  He has multiple complaints.  He says he could not tolerate metoprolol or diltiazem due to side effect.  He continued to have morning palpitation that wakes him up.  I suspect the morning palpitation was due to adrenaline rush and release of morning cortisol.  Recent heart monitor showed no significant arrhythmia.  He also complained of occasional spontaneous dyspnea, more noticeable when the weather is humid and hot.  Some the symptoms may be more related to Brilinta, comparing  Plavix versus Brilinta, we ultimately decided to leave him on the Brilinta.  Overall, he has been doing well.  He is on elliptical 3-4 times a week and has been able to exercise for 20+ minutes without exertional chest discomfort.  He can follow-up next month as previously scheduled.   ROS:   Patient complains of intermittent shortness of breath and morning palpitation but no chest pain or dizziness.  Studies Reviewed: .        Cardiac Studies & Procedures   CARDIAC CATHETERIZATION  CARDIAC CATHETERIZATION 12/10/2022  Narrative 1.  Patent left main and LAD with mild nonobstructive plaquing but no significant stenosis 2.  Moderate eccentric nonobstructive mid RCA stenosis with diffuse plaquing throughout the large dominant RCA 3.  Severe subtotal 99% stenosis of the mid circumflex with a sequential lesion into the branching obtuse marginal, treated successfully with PCI using a 2.25 x 24 mm Synergy DES 4.  Mild segmental LV contraction abnormality with mild hypokinesis of the anterolateral wall and preserved overall LVEF of 55%  Recommendations: Aggressive medical therapy, DAPT with aspirin and ticagrelor at least 12 months without an interruption.  As long as no complications arise, hospital discharge tomorrow.  Findings Coronary Findings Diagnostic  Dominance: Right  Left Main The vessel exhibits minimal luminal irregularities.  Left Anterior Descending There is mild diffuse disease throughout the vessel.  Left Circumflex Mid Cx lesion is 99% stenosed.  Right Coronary Artery There is mild diffuse disease throughout the vessel. Prox RCA lesion is 30% stenosed. Mid RCA lesion is 50% stenosed.  The lesion is eccentric.  Intervention  Mid Cx lesion Stent CATH LAUNCHER 6FR EBU3.5 guide catheter was inserted. Lesion crossed with guidewire using a WIRE COUGAR XT STRL 190CM. Pre-stent angioplasty was performed using a BALLN SAPPHIRE 2.0X12. A drug-eluting stent was successfully  placed using a SYNERGY XD 2.25X24. Post-stent angioplasty was performed using a BALL SAPPHIRE NC24 2.50X15. Maximum pressure:  20 atm. Post-Intervention Lesion Assessment The intervention was successful. Pre-interventional TIMI flow is 3. Post-intervention TIMI flow is 3. No complications occurred at this lesion. There is a 0% residual stenosis post intervention.     ECHOCARDIOGRAM  ECHOCARDIOGRAM COMPLETE 12/11/2022  Narrative ECHOCARDIOGRAM REPORT    Patient Name:   [name] Date of Exam: 12/11/2022 Medical Rec #:  [id number]            Height:       69.0 in Accession #:    [id number]           Weight:       165.0 lb Date of Birth:  29-Jun-1961            BSA:          1.904 m Patient Age:    61 years             BP:           119/91 mmHg Patient Gender: M                    HR:           81 bpm. Exam Location:  Inpatient  Procedure: 2D Echo, Cardiac Doppler and Color Doppler  Indications:    Chest Pain  History:        Patient has no prior history of Echocardiogram examinations. Signs/Symptoms:Chest Pain; Risk Factors:Hypertension.  Sonographer:    Lucy Antigua Referring Phys: Clydie Braun  IMPRESSIONS   1. Left ventricular ejection fraction, by estimation, is 60 to 65%. The left ventricle has normal function. The left ventricle has no regional wall motion abnormalities. Left ventricular diastolic parameters are consistent with Grade I diastolic dysfunction (impaired relaxation). 2. Right ventricular systolic function is normal. The right ventricular size is normal. There is normal pulmonary artery systolic pressure. 3. No evidence of mitral valve regurgitation. 4. Aortic valve regurgitation is not visualized. 5. The inferior vena cava is normal in size with greater than 50% respiratory variability, suggesting right atrial pressure of 3 mmHg.  Comparison(s): No prior Echocardiogram.  Conclusion(s)/Recommendation(s): Normal biventricular function without  evidence of hemodynamically significant valvular heart disease.  FINDINGS Left Ventricle: Left ventricular ejection fraction, by estimation, is 60 to 65%. The left ventricle has normal function. The left ventricle has no regional wall motion abnormalities. The left ventricular internal cavity size was normal in size. There is no left ventricular hypertrophy. Left ventricular diastolic parameters are consistent with Grade I diastolic dysfunction (impaired relaxation).  Right Ventricle: The right ventricular size is normal. Right ventricular systolic function is normal. There is normal pulmonary artery systolic pressure. The tricuspid regurgitant velocity is 1.96 m/s, and with an assumed right atrial pressure of 3 mmHg, the estimated right ventricular systolic pressure is 18.4 mmHg.  Left Atrium: Left atrial size was normal in size.  Right Atrium: Right atrial size was normal in size.  Pericardium: There is no evidence of pericardial effusion.  Mitral Valve: No evidence of mitral valve regurgitation.  Tricuspid Valve: Tricuspid valve regurgitation is not demonstrated.  Aortic Valve: Aortic valve regurgitation is  The lesion is eccentric.  Intervention  Mid Cx lesion Stent CATH LAUNCHER 6FR EBU3.5 guide catheter was inserted. Lesion crossed with guidewire using a WIRE COUGAR XT STRL 190CM. Pre-stent angioplasty was performed using a BALLN SAPPHIRE 2.0X12. A drug-eluting stent was successfully  placed using a SYNERGY XD 2.25X24. Post-stent angioplasty was performed using a BALL SAPPHIRE NC24 2.50X15. Maximum pressure:  20 atm. Post-Intervention Lesion Assessment The intervention was successful. Pre-interventional TIMI flow is 3. Post-intervention TIMI flow is 3. No complications occurred at this lesion. There is a 0% residual stenosis post intervention.     ECHOCARDIOGRAM  ECHOCARDIOGRAM COMPLETE 12/11/2022  Narrative ECHOCARDIOGRAM REPORT    Patient Name:   [name] Date of Exam: 12/11/2022 Medical Rec #:  [id number]            Height:       69.0 in Accession #:    [id number]           Weight:       165.0 lb Date of Birth:  29-Jun-1961            BSA:          1.904 m Patient Age:    61 years             BP:           119/91 mmHg Patient Gender: M                    HR:           81 bpm. Exam Location:  Inpatient  Procedure: 2D Echo, Cardiac Doppler and Color Doppler  Indications:    Chest Pain  History:        Patient has no prior history of Echocardiogram examinations. Signs/Symptoms:Chest Pain; Risk Factors:Hypertension.  Sonographer:    Lucy Antigua Referring Phys: Clydie Braun  IMPRESSIONS   1. Left ventricular ejection fraction, by estimation, is 60 to 65%. The left ventricle has normal function. The left ventricle has no regional wall motion abnormalities. Left ventricular diastolic parameters are consistent with Grade I diastolic dysfunction (impaired relaxation). 2. Right ventricular systolic function is normal. The right ventricular size is normal. There is normal pulmonary artery systolic pressure. 3. No evidence of mitral valve regurgitation. 4. Aortic valve regurgitation is not visualized. 5. The inferior vena cava is normal in size with greater than 50% respiratory variability, suggesting right atrial pressure of 3 mmHg.  Comparison(s): No prior Echocardiogram.  Conclusion(s)/Recommendation(s): Normal biventricular function without  evidence of hemodynamically significant valvular heart disease.  FINDINGS Left Ventricle: Left ventricular ejection fraction, by estimation, is 60 to 65%. The left ventricle has normal function. The left ventricle has no regional wall motion abnormalities. The left ventricular internal cavity size was normal in size. There is no left ventricular hypertrophy. Left ventricular diastolic parameters are consistent with Grade I diastolic dysfunction (impaired relaxation).  Right Ventricle: The right ventricular size is normal. Right ventricular systolic function is normal. There is normal pulmonary artery systolic pressure. The tricuspid regurgitant velocity is 1.96 m/s, and with an assumed right atrial pressure of 3 mmHg, the estimated right ventricular systolic pressure is 18.4 mmHg.  Left Atrium: Left atrial size was normal in size.  Right Atrium: Right atrial size was normal in size.  Pericardium: There is no evidence of pericardial effusion.  Mitral Valve: No evidence of mitral valve regurgitation.  Tricuspid Valve: Tricuspid valve regurgitation is not demonstrated.  Aortic Valve: Aortic valve regurgitation is

## 2023-06-15 ENCOUNTER — Telehealth: Payer: Self-pay | Admitting: *Deleted

## 2023-06-15 NOTE — Telephone Encounter (Addendum)
Pt left detailed message on rehab's voicemail stating after seeing cardiology, he can continue rehab at home and to cancel all his appointments. He asked for return call after 1030 today. Attempted to call pt to discuss to verify that he does want to discharge from the program. No answer at this time. Lmtcb.

## 2023-06-16 ENCOUNTER — Encounter: Payer: Self-pay | Admitting: *Deleted

## 2023-06-16 DIAGNOSIS — Z955 Presence of coronary angioplasty implant and graft: Secondary | ICD-10-CM

## 2023-06-16 DIAGNOSIS — I214 Non-ST elevation (NSTEMI) myocardial infarction: Secondary | ICD-10-CM

## 2023-06-16 NOTE — Progress Notes (Signed)
Cardiac Individual Treatment Plan  Patient Details  Name: [name] MRN: [id number] Date of Birth: 10-02-1960 Referring Provider:   Flowsheet Row Cardiac Rehab from 01/19/2023 in [facility] Cardiac and Pulmonary Rehab  Referring Provider Dr. Bryan Lemma, MD       Initial Encounter Date:  Flowsheet Row Cardiac Rehab from 01/19/2023 in [facility] Cardiac and Pulmonary Rehab  Date 01/19/23       Visit Diagnosis: NSTEMI (non-ST elevated myocardial infarction) [facility])  Status post coronary artery stent placement  [name] Home Medications on Admission:  Current Outpatient Medications:    acyclovir (ZOVIRAX) 800 MG tablet, Take 1 tablet (800 mg total) by mouth 2 (two) times daily., Disp: 180 tablet, Rfl: 3   amLODipine (NORVASC) 10 MG tablet, Take 1 tablet (10 mg total) by mouth every evening., Disp: 90 tablet, Rfl: 1   aspirin EC 81 MG tablet, Take 1 tablet (81 mg total) by mouth daily. Swallow whole., Disp: 30 tablet, Rfl: 12   atorvastatin (LIPITOR) 80 MG tablet, Take 1 tablet (80 mg total) by mouth daily., Disp: 90 tablet, Rfl: 2   baclofen (LIORESAL) 20 MG tablet, Take 1 tablet (20 mg total) by mouth every 8 (eight) hours as needed for muscle spasms. (Patient not taking: Reported on 06/14/2023), Disp: 60 each, Rfl: 5   cetirizine (ZYRTEC) 10 MG tablet, Take 10 mg by mouth daily., Disp: , Rfl:    diazepam (VALIUM) 10 MG tablet, Take 10 mg by mouth 3 (three) times daily., Disp: , Rfl:    diclofenac sodium (VOLTAREN) 1 % GEL, Apply 1 application  topically daily as needed (For pain)., Disp: , Rfl:    EPINEPHrine 0.3 mg/0.3 mL IJ SOAJ injection, USE AS DIRECTED, Disp: 2 each, Rfl: 1   fentaNYL (DURAGESIC - DOSED MCG/HR) 50 MCG/HR, Place 50 mcg onto the skin every other day., Disp: , Rfl:    fluticasone (FLONASE) 50 MCG/ACT nasal spray, USE TWO SPRAYS IN EACH NOSTRIL DAILY, Disp: 16 g, Rfl: 11   lactulose, encephalopathy, (CHRONULAC) 10 GM/15ML SOLN, Take 20 g by mouth at bedtime., Disp: ,  Rfl:    nitroGLYCERIN (NITROSTAT) 0.4 MG SL tablet, Place 1 tablet (0.4 mg total) under the tongue every 5 (five) minutes as needed for chest pain., Disp: 100 tablet, Rfl: 3   oxyCODONE (OXY IR/ROXICODONE) 5 MG immediate release tablet, Take 5 mg by mouth every 6 (six) hours as needed for moderate pain., Disp: , Rfl: 0   promethazine (PHENERGAN) 25 MG tablet, Take 12.5 mg by mouth every 6 (six) hours as needed for nausea or vomiting., Disp: , Rfl:    scopolamine (TRANSDERM SCOP, 1.5 MG,) 1 MG/3DAYS, Place 1 patch (1.5 mg total) onto the skin every 3 (three) days. (Patient not taking: Reported on 06/14/2023), Disp: 10 patch, Rfl: 0   ticagrelor (BRILINTA) 90 MG TABS tablet, Take 1 tablet (90 mg total) by mouth 2 (two) times daily., Disp: 60 tablet, Rfl: 11   traZODone (DESYREL) 100 MG tablet, Take 50 mg by mouth at bedtime., Disp: , Rfl:    triamcinolone cream (KENALOG) 0.1 %, Apply 1 application topically 2 (two) times daily., Disp: 30 g, Rfl: 0  Past Medical History: Past Medical History:  Diagnosis Date   ADHD (attention deficit hyperactivity disorder), inattentive type    sees Dr. Deboraha Sprang in [place], [place]    Anxiety    sees Dr. Deboraha Sprang in [place], [place]    Arthritis    Benign enlargement of prostate    sees  [facility]    CAD- 1 Vessel, DES PCI LCx. (NSTEMI) 12/10/2022   LHC 12/10/2022:  Minimal LM & LAD disease. Mid Cx 99% (subtotal occlusion) =>  PCI using a 2.25 x 24 mm Synergy DES; pRCA 30% & mRCA 50%.      Carpal tunnel syndrome on left    sees Dr. Kathie Dike at [facility]    Chronic pain    sees Dr. Thyra Breed (meds) and Altamease Oiler (accupuncture)    Depression    sees Dr. Deboraha Sprang   Functional impotence    Hyperlipidemia with target LDL less than 70 07/09/2014   Hypogonadism in male    sees Carollee Herter (a [place]) at [facility]    Insomnia    Kidney stones    NSTEMI (non-ST elevated myocardial infarction) (HCC) 12/10/2022   Admitted with stuttering  chest pain, troponin elevation. => 9 9% LCx=> DES stent.  Normal EF on echo.   Sinusitis, acute 07/17/2016   Urticaria due to cold     Tobacco Use: Social History   Tobacco Use  Smoking Status Former  Smokeless Tobacco Never  Tobacco Comments   quit 35 years    Labs: Review Flowsheet  More data exists      Latest Ref Rng & Units 04/03/2019 06/05/2020 11/09/2021 12/10/2022 12/11/2022  Labs for ITP Cardiac and Pulmonary Rehab  Cholestrol 0 - 200 mg/dL 213  086  578  469  -  LDL (calc) 0 - 99 mg/dL 629  528  413  244  -  HDL-C >40 mg/dL 01.02  58  72.53  61  -  Trlycerides <150 mg/dL 664.4  034  742.5  71  -  Hemoglobin A1c 4.8 - 5.6 % - 5.8  6.0  - 5.9     Details             Exercise Target Goals: Exercise Program Goal: Individual exercise prescription set using results from initial 6 min walk test and THRR while considering  patient's activity barriers and safety.   Exercise Prescription Goal: Initial exercise prescription builds to 30-45 minutes a day of aerobic activity, 2-3 days per week.  Home exercise guidelines will be given to patient during program as part of exercise prescription that the participant will acknowledge.   Education: Aerobic Exercise: - Group verbal and visual presentation on the components of exercise prescription. Introduces F.I.T.T principle from ACSM for exercise prescriptions.  Reviews F.I.T.T. principles of aerobic exercise including progression. Written material given at graduation. Flowsheet Row Cardiac Rehab from 04/20/2023 in [facility] Cardiac and Pulmonary Rehab  Education need identified 01/19/23       Education: Resistance Exercise: - Group verbal and visual presentation on the components of exercise prescription. Introduces F.I.T.T principle from ACSM for exercise prescriptions  Reviews F.I.T.T. principles of resistance exercise including progression. Written material given at graduation.    Education: Exercise & Equipment Safety: -  Individual verbal instruction and demonstration of equipment use and safety with use of the equipment. Flowsheet Row Cardiac Rehab from 04/20/2023 in [facility] Cardiac and Pulmonary Rehab  Date 01/19/23  Educator NT  Instruction Review Code 1- Verbalizes Understanding       Education: Exercise Physiology & General Exercise Guidelines: - Group verbal and written instruction with models to review the exercise physiology of the cardiovascular system and associated critical values. Provides general exercise guidelines with specific guidelines to those with heart or lung disease.  Flowsheet Row Cardiac Rehab from 04/20/2023 in [facility] Cardiac and Pulmonary Rehab  Date 02/23/23  Educator [facility]  Instruction Review Code 1- [employer] Understanding       Education: Flexibility, Balance, Mind/Body Relaxation: - Group verbal and visual presentation with interactive activity on the components of exercise prescription. Introduces F.I.T.T principle from ACSM for exercise prescriptions. Reviews F.I.T.T. principles of flexibility and balance exercise training including progression. Also discusses the mind body connection.  Reviews various relaxation techniques to help reduce and manage stress (i.e. Deep breathing, progressive muscle relaxation, and visualization). Balance handout provided to take home. Written material given at graduation.   Activity Barriers & Risk Stratification:  Activity Barriers & Cardiac Risk Stratification - 01/19/23 1536       Activity Barriers & Cardiac Risk Stratification   Activity Barriers Back Problems;Shortness of Breath;Neck/Spine Problems;Other (comment)    Comments Torn rotator cuff last year, neuropathy causes pain in feet    Cardiac Risk Stratification Moderate             6 Minute Walk:  6 Minute Walk     Row Name 01/19/23 1525         6 Minute Walk   Phase Initial     Distance 1750 feet     Walk Time 6 minutes     # of Rest Breaks 0     MPH 3.31     METS  4.52     RPE 11     Perceived Dyspnea  1     VO2 Peak 15.83     Symptoms Yes (comment)     Comments Neck Pain 3/10, Feet Pain 4/10     Resting HR 78 bpm     Resting BP 120/78     Resting Oxygen Saturation  97 %     Exercise Oxygen Saturation  during 6 min walk 95 %     Max Ex. HR 107 bpm     Max Ex. BP 134/74     2 Minute Post BP 112/68              Oxygen Initial Assessment:   Oxygen Re-Evaluation:   Oxygen Discharge (Final Oxygen Re-Evaluation):   Initial Exercise Prescription:  Initial Exercise Prescription - 01/19/23 1500       Date of Initial Exercise RX and Referring Provider   Date 01/19/23    Referring Provider Dr. Bryan Lemma, MD      Oxygen   Maintain Oxygen Saturation 88% or higher      Treadmill   MPH 2.8    Grade 1    Minutes 15    METs 3.53      Recumbant Bike   Level 3    RPM 50    Watts 59    Minutes 15    METs 4.52      NuStep   Level 4    SPM 80    Minutes 15    METs 4.52      REL-XR   Level 2    Speed 50    Minutes 15    METs 4.52      Prescription Details   Frequency (times per week) 2    Duration Progress to 30 minutes of continuous aerobic without signs/symptoms of physical distress      Intensity   THRR 40-80% of Max Heartrate 110-143    Ratings of Perceived Exertion 11-13    Perceived Dyspnea 0-4      Progression   Progression Continue to progress workloads to maintain intensity without signs/symptoms of physical  distress.      Resistance Training   Training Prescription Yes    Weight 3 lb, 5 lb    Reps 10-15             Perform Capillary Blood Glucose checks as needed.  Exercise Prescription Changes:   Exercise Prescription Changes     Row Name 01/19/23 1500 01/26/23 1600 01/31/23 1400 02/07/23 1400 02/22/23 0800     Response to Exercise   Blood Pressure (Admit) 120/78 122/64 -- 124/72 116/62   Blood Pressure (Exercise) 134/74 138/62 -- 140/68 112/60   Blood Pressure (Exit) 112/68 102/64 --  120/78 110/60   Heart Rate (Admit) 78 bpm 75 bpm -- 70 bpm 93 bpm   Heart Rate (Exercise) 107 bpm 97 bpm -- 123 bpm 114 bpm   Heart Rate (Exit) 81 bpm 74 bpm -- 79 bpm 100 bpm   Oxygen Saturation (Admit) 97 % -- -- -- --   Oxygen Saturation (Exercise) 95 % -- -- -- --   Oxygen Saturation (Exit) 97 % -- -- -- --   Rating of Perceived Exertion (Exercise) 11 15 -- 15 13   Perceived Dyspnea (Exercise) 1 -- -- -- --   Symptoms Neck pain 3/10, Feet Pain 4/10 none -- none none   Comments Results First full day of exercise -- -- --   Duration -- Continue with 30 min of aerobic exercise without signs/symptoms of physical distress. -- Continue with 30 min of aerobic exercise without signs/symptoms of physical distress. Continue with 30 min of aerobic exercise without signs/symptoms of physical distress.   Intensity -- THRR unchanged -- THRR unchanged THRR unchanged     Progression   Progression -- Continue to progress workloads to maintain intensity without signs/symptoms of physical distress. -- Continue to progress workloads to maintain intensity without signs/symptoms of physical distress. Continue to progress workloads to maintain intensity without signs/symptoms of physical distress.   Average METs -- 2.99 -- 3.26 3.38     Resistance Training   Training Prescription -- Yes -- Yes Yes   Weight -- 3 lb, 5 lb -- 3 lb, 5 lb 3 lb   Reps -- 10-15 -- 10-15 10-15     Interval Training   Interval Training -- No -- No No     Treadmill   MPH -- 2.2 -- 2.2 --   Grade -- 1 -- 1 --   Minutes -- 15 -- 15 --   METs -- 2.99 -- 2.99 --     Recumbant Bike   Level -- -- -- 4 4   Watts -- -- -- 30 32   Minutes -- -- -- 15 15   METs -- -- -- -- 2.62     REL-XR   Level -- 2 -- 3 3   Minutes -- 15 -- 15 15   METs -- -- -- 3.6 3.6     Track   Laps -- -- -- 52 42   Minutes -- -- -- 15 15   METs -- -- -- 3.83 --     Home Exercise Plan   Plans to continue exercise at -- -- Home (comment)   walking, elliptical Home (comment)  walking, elliptical Home (comment)  walking, elliptical   Frequency -- -- Add 3 additional days to program exercise sessions. Add 3 additional days to program exercise sessions. Add 3 additional days to program exercise sessions.   Initial Home Exercises Provided -- -- 01/31/23 01/31/23 01/31/23  Oxygen   Maintain Oxygen Saturation -- 88% or higher -- 88% or higher 88% or higher    Row Name 03/07/23 1600 03/23/23 1100 04/04/23 1600 04/20/23 1000 05/02/23 1400     Response to Exercise   Blood Pressure (Admit) 102/60 100/62 118/60 132/78 104/60   Blood Pressure (Exercise) 130/80 130/80 -- -- --   Blood Pressure (Exit) 110/60 110/64 120/68 128/76 128/70   Heart Rate (Admit) 83 bpm 86 bpm 82 bpm 93 bpm 108 bpm   Heart Rate (Exercise) 111 bpm 107 bpm 106 bpm 120 bpm 126 bpm   Heart Rate (Exit) 89 bpm 85 bpm 85 bpm 99 bpm 114 bpm   Rating of Perceived Exertion (Exercise) 13 12 11 12 12    Symptoms none none none -- none   Duration Continue with 30 min of aerobic exercise without signs/symptoms of physical distress. Continue with 30 min of aerobic exercise without signs/symptoms of physical distress. Continue with 30 min of aerobic exercise without signs/symptoms of physical distress. Continue with 30 min of aerobic exercise without signs/symptoms of physical distress. Continue with 30 min of aerobic exercise without signs/symptoms of physical distress.   Intensity THRR unchanged THRR unchanged THRR unchanged THRR unchanged THRR unchanged     Progression   Progression Continue to progress workloads to maintain intensity without signs/symptoms of physical distress. Continue to progress workloads to maintain intensity without signs/symptoms of physical distress. Continue to progress workloads to maintain intensity without signs/symptoms of physical distress. Continue to progress workloads to maintain intensity without signs/symptoms of physical distress. Continue to  progress workloads to maintain intensity without signs/symptoms of physical distress.   Average METs 3.56 3.3 3.97 4.31 3.63     Resistance Training   Training Prescription Yes Yes Yes Yes Yes   Weight 3 lb 3 lb 3 lb 7 lb 7 lb   Reps 10-15 10-15 10-15 10-15 10-15     Interval Training   Interval Training No No No No No     Recumbant Bike   Level 5 5 -- 5 --   Watts 34 34 -- 40 --   Minutes 15 15 -- 15 --   METs 3.47 -- -- 3.65 --     NuStep   Level -- -- -- -- 5   Minutes -- -- -- -- 15     REL-XR   Level 4 4 4 6 6    Minutes 15 15 15 15 15    METs 4.3 3.2 3.34 4.6 4.1     Track   Laps 44 43 43 42 46   Minutes 15 15 15  -- --   METs 3.39 3.34 3.34 3.28 3.5     Home Exercise Plan   Plans to continue exercise at Home (comment)  walking, elliptical Home (comment)  walking, elliptical Home (comment)  walking, elliptical Home (comment)  walking, elliptical Home (comment)  walking, elliptical   Frequency Add 3 additional days to program exercise sessions. Add 3 additional days to program exercise sessions. Add 3 additional days to program exercise sessions. Add 3 additional days to program exercise sessions. Add 3 additional days to program exercise sessions.   Initial Home Exercises Provided 01/31/23 01/31/23 01/31/23 01/31/23 01/31/23     Oxygen   Maintain Oxygen Saturation 88% or higher 88% or higher 88% or higher 88% or higher 88% or higher    Row Name 05/16/23 1000 05/31/23 1400           Response to Exercise   Blood Pressure (  Admit) 142/72 102/60      Blood Pressure (Exit) 124/62 100/62      Heart Rate (Admit) 87 bpm 86 bpm      Heart Rate (Exercise) 108 bpm 117 bpm      Heart Rate (Exit) 105 bpm 101 bpm      Oxygen Saturation (Admit) 95 % 98 %      Oxygen Saturation (Exercise) 95 % 95 %      Oxygen Saturation (Exit) 95 % 97 %      Rating of Perceived Exertion (Exercise) 11 12      Symptoms None None      Duration Continue with 30 min of aerobic exercise without  signs/symptoms of physical distress. Continue with 30 min of aerobic exercise without signs/symptoms of physical distress.      Intensity THRR unchanged THRR unchanged        Progression   Progression Continue to progress workloads to maintain intensity without signs/symptoms of physical distress. Continue to progress workloads to maintain intensity without signs/symptoms of physical distress.      Average METs 3.49 2.9        Resistance Training   Training Prescription Yes Yes      Weight 7 lb 7 lb      Reps 10-15 10-15        Interval Training   Interval Training No No        Oxygen   Oxygen Continuous --        NuStep   Level 5 6      Minutes 15 15      METs 3.7 3.9        Arm Ergometer   Level -- 4      Minutes -- 15      METs -- 1        Track   Laps 42 45      METs 3.28 3.45        Home Exercise Plan   Plans to continue exercise at Home (comment)  walking, elliptical Home (comment)  walking, elliptical      Frequency Add 3 additional days to program exercise sessions. Add 3 additional days to program exercise sessions.      Initial Home Exercises Provided 01/31/23 01/31/23        Oxygen   Maintain Oxygen Saturation 88% or higher 88% or higher               Exercise Comments:   Exercise Comments     Row Name 01/24/23 1345 05/18/23 1317         Exercise Comments First full day of exercise!  Patient was oriented to gym and equipment including functions, settings, policies, and procedures.  [name] individual exercise prescription and treatment plan were reviewed.  All starting workloads were established based on the results of the 6 minute walk test done at initial orientation visit.  The plan for exercise progression was also introduced and progression will be customized based on patient's performance and goals. [name] called to let us know that his pain management MD does not want him lifting weights at this time d/t rotator cuff injury in the past, he has  had incr neck pain. Advised pt we will make that change to his exercise Rx, he will do range of motion during resistance exercise.               Exercise Goals and Review:   Exercise Goals  Row Name 01/19/23 1540             Exercise Goals   Increase Physical Activity Yes       Intervention Provide advice, education, support and counseling about physical activity/exercise needs.;Develop an individualized exercise prescription for aerobic and resistive training based on initial evaluation findings, risk stratification, comorbidities and participant's personal goals.       Expected Outcomes Short Term: Attend rehab on a regular basis to increase amount of physical activity.;Long Term: Add in home exercise to make exercise part of routine and to increase amount of physical activity.;Long Term: Exercising regularly at least 3-5 days a week.       Increase Strength and Stamina Yes       Intervention Provide advice, education, support and counseling about physical activity/exercise needs.;Develop an individualized exercise prescription for aerobic and resistive training based on initial evaluation findings, risk stratification, comorbidities and participant's personal goals.       Expected Outcomes Short Term: Increase workloads from initial exercise prescription for resistance, speed, and METs.;Short Term: Perform resistance training exercises routinely during rehab and add in resistance training at home;Long Term: Improve cardiorespiratory fitness, muscular endurance and strength as measured by increased METs and functional capacity ( )       Able to understand and use rate of perceived exertion (RPE) scale Yes       Intervention Provide education and explanation on how to use RPE scale       Expected Outcomes Short Term: Able to use RPE daily in rehab to express subjective intensity level;Long Term:  Able to use RPE to guide intensity level when exercising independently       Able to  understand and use Dyspnea scale Yes       Intervention Provide education and explanation on how to use Dyspnea scale       Expected Outcomes Short Term: Able to use Dyspnea scale daily in rehab to express subjective sense of shortness of breath during exertion;Long Term: Able to use Dyspnea scale to guide intensity level when exercising independently       Knowledge and understanding of Target Heart Rate Range (THRR) Yes       Intervention Provide education and explanation of THRR including how the numbers were predicted and where they are located for reference       Expected Outcomes Long Term: Able to use THRR to govern intensity when exercising independently;Short Term: Able to state/look up THRR;Short Term: Able to use daily as guideline for intensity in rehab       Able to check pulse independently Yes       Intervention Provide education and demonstration on how to check pulse in carotid and radial arteries.;Review the importance of being able to check your own pulse for safety during independent exercise       Expected Outcomes Short Term: Able to explain why pulse checking is important during independent exercise;Long Term: Able to check pulse independently and accurately       Understanding of Exercise Prescription Yes       Intervention Provide education, explanation, and written materials on patient's individual exercise prescription       Expected Outcomes Short Term: Able to explain program exercise prescription;Long Term: Able to explain home exercise prescription to exercise independently                Exercise Goals Re-Evaluation :  Exercise Goals Re-Evaluation     Row Name 01/24/23 1345 01/26/23 1625  01/31/23 1441 02/07/23 1433 02/22/23 0825     Exercise Goal Re-Evaluation   Exercise Goals Review Increase Physical Activity;Able to understand and use rate of perceived exertion (RPE) scale;Knowledge and understanding of Target Heart Rate Range (THRR);Understanding of  Exercise Prescription;Increase Strength and Stamina;Able to check pulse independently Increase Physical Activity;Increase Strength and Stamina;Understanding of Exercise Prescription Increase Physical Activity;Increase Strength and Stamina;Understanding of Exercise Prescription Increase Physical Activity;Increase Strength and Stamina;Understanding of Exercise Prescription Increase Physical Activity;Increase Strength and Stamina;Understanding of Exercise Prescription   Comments Reviewed RPE scale, THR and program prescription with pt today.  Pt voiced understanding and was given a copy of goals to take home. [name] is off to a good start in the program. During his first day of exercise he was able to walk on the treadmill at a speed of 2.2 mph with a 1% incline. He also did well on the XR at level 2. He tolerated using a 3 lb and a 5 lb hand weight for resistance training as well. We will continue to monitor his progress in the program [name] is already exercising at home on his off days. Reviewed home exercise with pt today.  Pt plans to walk and use elliptical at home for exercise.  Reviewed THR, pulse, RPE, sign and symptoms, pulse oximetery and when to call 911 or MD.  Also discussed weather considerations and indoor options.  Pt voiced understanding. [name] is doing well in the program. He is now walking up to 52 laps on the track and increased his overall average MET level to 3.26 METs. He also improved to level 4 on the recumbent bike and level 3 on the XR. He has continued to use 3 lb hand weights for resistance training as well. We will continue to monitor his progress in the program. [name] is doing well in rehab.  He is up to 32 watts on the bike and consistently walking a mile in his time on the track.  We will encourage him to try heavier weights as well.  We will continue to monitor his progress.   Expected Outcomes Short: Use RPE daily to regulate intensity.  Long: Follow program prescription in THR.  Short: Continue to follow current exercise prescription. Long: Continue to increase MET level and stamina. Short: Continue to exercise at home on off days Long: Continue to follow program prescription Short: Try 4 lb hand weights for resistance training, Long: Conitnue to improve strength and stamina. Short: Increase hand weights Long: Continue to improve stamina    Row Name 02/28/23 1405 03/07/23 1701 03/23/23 1123 03/28/23 1405 04/04/23 1643     Exercise Goal Re-Evaluation   Exercise Goals Review Increase Physical Activity;Increase Strength and Stamina;Understanding of Exercise Prescription Increase Physical Activity;Increase Strength and Stamina;Understanding of Exercise Prescription Increase Physical Activity;Increase Strength and Stamina;Understanding of Exercise Prescription Increase Physical Activity;Increase Strength and Stamina;Understanding of Exercise Prescription Increase Physical Activity;Increase Strength and Stamina;Understanding of Exercise Prescription   Comments [name] is doing well in rehab. He is going to clubhouse at [facility] for exercise and planning to add in swimming soon at the the pool.  He is looking forward to getting into pool to have it go easiser on his joints. [name] continues to do well in rehab. He recently improved to level 4 on the XR and level 5 on the recumbent bike. He also increased his overall average MET level to 3.56 METs. He has consistently walked above 40 laps on the track as well. We will continue to monitor his  progress in the program. [name] only attended rehab twice since the last review. He continues to work at level 4 on the XR and level 5 on the recumbent bike. He also has continued to wak above 40 laps on the track. We will continue to monitor his progress in the program. [name] continues to do well in rehab. Today is working at [employer] level of 5 on the XR (METS 4.1). [name] just got back from the beach and said he was trying to walk while on vacation. But  also states he feels a "little slow" after coming back from the beach. He has been walking, using recumbent bike and weights at home 5 days a week. HAS attended 1 sesion in the past 2 weeks. maintianed his workload on the Xr and his laps on the track.  Will continue to monitor his progress and watch his attendance.   Expected Outcomes Short: Get in the pool Long: Continue to exercise independently Short: Continue to progressively increase workloads and push for more laps on the track. Long: Continue to improve strength and stamina. Short: Try level 5 on the XR. Long: Continue to increase overall MET level and stamina. Short: Maintain home exercise on off days of cardiac rehab class Long: Maintain independant exercise routine after graduation STG Attend all scheduled sessions to progress his exercise workloads to work on stamina and strangthe LTG continued exercise progression    Row Name 04/20/23 1057 04/20/23 1059 05/02/23 1444 05/02/23 1453 05/16/23 1024     Exercise Goal Re-Evaluation   Exercise Goals Review Increase Physical Activity;Increase Strength and Stamina;Understanding of Exercise Prescription -- Increase Strength and Stamina Increase Physical Activity;Increase Strength and Stamina;Understanding of Exercise Prescription Increase Physical Activity;Increase Strength and Stamina;Understanding of Exercise Prescription   Comments [name] attended 3 of 4 sessions. He did increase his REXR to level 6, maintinaing his other workloads. His RPE is 12. He increased his handweights from [name] attended 3 of 4 sessions. He did increase his REXR to level 6, maintaining his other workloads. His RPE is 12. He increased his handweights from 3 lb to 7 lbs. Will continue to encourage and monitor exercise  progression. and encourage attendance every session scheduled. [name] continues to attend rehab sessions. He is using his XR machine at home at level 6 which he says is harder than level 6 at rehab. He has  been using the pool as well for exercise. He feels that his strength is improving some although he still wants to improve more. Encouraged to continue exercise program and incr workloads as he is able. [name] continues to do well in rehab. He increased his laps on the track back up to 46 laps. He also improved to level 5 on the T4 nustep and stayed consistent on the XR at level 6. We will continue to monitor his progress in the program. [name] continues to do well in rehab. He has only attended one session in the past two weeks. In that one visit he slightly decreased his track laps from 46 to 42, and maintained intensity at level 5 on the T4 nustep.   Expected Outcomes -- STG Attend all scheduled sessions to progress his exercise workloads to work on stamina and strangthe LTG : Continued exercise progression as tolerated during program and after discharge. Short: Attend all scheduled sessions and try to incr workload  Long: Continue exercise progression as tolerated Short: Try level 7 on the XR. Long: Continue to increase overall METs and stamina. Short: Return to program  consistently. Long: Continue exercise to improve strength and stamina.    Row Name 05/31/23 1414 06/14/23 1556           Exercise Goal Re-Evaluation   Exercise Goals Review Increase Physical Activity;Increase Strength and Stamina;Understanding of Exercise Prescription Increase Physical Activity;Increase Strength and Stamina;Understanding of Exercise Prescription      Comments [name] continues to do well in rehab as he returns more regularly. He has increased his track laps back up to 45 and increased to level 6 on the T4. We will continue to monitor his progress in the program. [name] has not attended rehab during since the last review. He has let us know that he is to be placed on medical hold until 9/18. We will continue to monitor his progress upon return to the program.      Expected Outcomes Short: Continue to progressively increase  track laps and T4 workload as he becomes more consistent in rehab. Long: Continue exercise to improve strength and stamina. Short: Return to the program. Long: Continue to exercise to improve strength and stamina.               Discharge Exercise Prescription (Final Exercise Prescription Changes):  Exercise Prescription Changes - 05/31/23 1400       Response to Exercise   Blood Pressure (Admit) 102/60    Blood Pressure (Exit) 100/62    Heart Rate (Admit) 86 bpm    Heart Rate (Exercise) 117 bpm    Heart Rate (Exit) 101 bpm    Oxygen Saturation (Admit) 98 %    Oxygen Saturation (Exercise) 95 %    Oxygen Saturation (Exit) 97 %    Rating of Perceived Exertion (Exercise) 12    Symptoms None    Duration Continue with 30 min of aerobic exercise without signs/symptoms of physical distress.    Intensity THRR unchanged      Progression   Progression Continue to progress workloads to maintain intensity without signs/symptoms of physical distress.    Average METs 2.9      Resistance Training   Training Prescription Yes    Weight 7 lb    Reps 10-15      Interval Training   Interval Training No      NuStep   Level 6    Minutes 15    METs 3.9      Arm Ergometer   Level 4    Minutes 15    METs 1      Track   Laps 45    METs 3.45      Home Exercise Plan   Plans to continue exercise at Home (comment)   walking, elliptical   Frequency Add 3 additional days to program exercise sessions.    Initial Home Exercises Provided 01/31/23      Oxygen   Maintain Oxygen Saturation 88% or higher             Nutrition:  Target Goals: Understanding of nutrition guidelines, daily intake of sodium 1500mg , cholesterol 200mg , calories 30% from fat and 7% or less from saturated fats, daily to have 5 or more servings of fruits and vegetables.  Education: All About Nutrition: -Group instruction provided by verbal, written material, interactive activities, discussions, models, and  posters to present general guidelines for heart healthy nutrition including fat, fiber, MyPlate, the role of sodium in heart healthy nutrition, utilization of the nutrition label, and utilization of this knowledge for meal planning. Follow up email sent as well. Written material  given at graduation.   Biometrics:  Pre Biometrics - 01/19/23 1542       Pre Biometrics   Height 5' 8.5" (1.74 m)    Weight 163 lb 12.8 oz (74.3 kg)    Waist Circumference 36.5 inches    Hip Circumference 36 inches    Waist to Hip Ratio 1.01 %    BMI (Calculated) 24.54    Single Leg Stand 8.7 seconds              Nutrition Therapy Plan and Nutrition Goals:  Nutrition Therapy & Goals - 01/19/23 1522       Intervention Plan   Intervention Prescribe, educate and counsel regarding individualized specific dietary modifications aiming towards targeted core components such as weight, hypertension, lipid management, diabetes, heart failure and other comorbidities.    Expected Outcomes Short Term Goal: Understand basic principles of dietary content, such as calories, fat, sodium, cholesterol and nutrients.;Short Term Goal: A plan has been developed with personal nutrition goals set during dietitian appointment.;Long Term Goal: Adherence to prescribed nutrition plan.             Nutrition Assessments:  MEDIFICTS Score Key: >=70 Need to make dietary changes  40-70 Heart Healthy Diet <= 40 Therapeutic Level Cholesterol Diet  Flowsheet Row Cardiac Rehab from 01/19/2023 in [facility] Cardiac and Pulmonary Rehab  Picture Your Plate Total Score on Admission 77      Picture Your Plate Scores: <16 Unhealthy dietary pattern with much room for improvement. 41-50 Dietary pattern unlikely to meet recommendations for good health and room for improvement. 51-60 More healthful dietary pattern, with some room for improvement.  >60 Healthy dietary pattern, although there may be some specific behaviors that could be  improved.    Nutrition Goals Re-Evaluation:  Nutrition Goals Re-Evaluation     Row Name 01/31/23 1436 02/28/23 1407 03/28/23 1339 05/02/23 1453       Goals   Nutrition Goal Review dietary information Short: Review heart healthy diet paperwork Long: Meet with dietitian -- --    Comment [name] is off to a good start in rehab.  He feels that he has some knowledge of a heart healthy diet, but he would like to meet with dietitian once they start.  In the mean time, he is trying to get in a good variety and monitor his sodium intake.  He was given a packet to review on heart healthy diet. [name] is doing well in rehab.  He is doing well on his diet and read through material.  He is still following [place] diet and eating more fruits and vegetables.   He feels confident with his diet currently. Pt scheduled RD appt for 7/3 Pt had to cancel 7/3 appt. Rescheduled RD appt for 8/7.    Expected Outcome Short: Review heart healthy diet paperwork Long: Meet with dietitian Short: Set up appt with dietitian Long: Continue with adding in fruits and vegetables Short: Meet with dietician Long: Maintain goals set with dietician Short: Meet with dieatician  Long: Maintain goals set with dietician.             Nutrition Goals Discharge (Final Nutrition Goals Re-Evaluation):  Nutrition Goals Re-Evaluation - 05/02/23 1453       Goals   Comment Pt had to cancel 7/3 appt. Rescheduled RD appt for 8/7.    Expected Outcome Short: Meet with dieatician  Long: Maintain goals set with dietician.             Psychosocial: Target  Goals: Acknowledge presence or absence of significant depression and/or stress, maximize coping skills, provide positive support system. Participant is able to verbalize types and ability to use techniques and skills needed for reducing stress and depression.   Education: Stress, Anxiety, and Depression - Group verbal and visual presentation to define topics covered.  Reviews how body  is impacted by stress, anxiety, and depression.  Also discusses healthy ways to reduce stress and to treat/manage anxiety and depression.  Written material given at graduation. Flowsheet Row Cardiac Rehab from 04/20/2023 in [facility] Cardiac and Pulmonary Rehab  Date 02/16/23  Educator KW  Instruction Review Code 1- [employer] Understanding       Education: Sleep Hygiene -Provides group verbal and written instruction about how sleep can affect your health.  Define sleep hygiene, discuss sleep cycles and impact of sleep habits. Review good sleep hygiene tips.    Initial Review & Psychosocial Screening:  Initial Psych Review & Screening - 01/04/23 1410       Initial Review   Current issues with Current Sleep Concerns;Current Stress Concerns    Source of Stress Concerns Unable to participate in former interests or hobbies      Family Dynamics   Good Support System? Yes   wife, mother, sister, extended family     Barriers   Psychosocial barriers to participate in program There are no identifiable barriers or psychosocial needs.;The patient should benefit from training in stress management and relaxation.      Screening Interventions   Interventions Encouraged to exercise;To provide support and resources with identified psychosocial needs;Provide feedback about the scores to participant    Expected Outcomes Short Term goal: Utilizing psychosocial counselor, staff and physician to assist with identification of specific Stressors or current issues interfering with healing process. Setting desired goal for each stressor or current issue identified.;Long Term Goal: Stressors or current issues are controlled or eliminated.;Short Term goal: Identification and review with participant of any Quality of Life or Depression concerns found by scoring the questionnaire.;Long Term goal: The participant improves quality of Life and PHQ9 Scores as seen by post scores and/or verbalization of changes              Quality of Life Scores:   Quality of Life - 01/19/23 1522       Quality of Life   Select Quality of Life      Quality of Life Scores   Health/Function Pre 16.77 %    Socioeconomic Pre 20.38 %    Psych/Spiritual Pre 20.57 %    Family Pre 17.4 %    GLOBAL Post 18.44 %            Scores of 19 and below usually indicate a poorer quality of life in these areas.  A difference of  2-3 points is a clinically meaningful difference.  A difference of 2-3 points in the total score of the Quality of Life Index has been associated with significant improvement in overall quality of life, self-image, physical symptoms, and general health in studies assessing change in quality of life.  PHQ-9: Review Flowsheet  More data exists      02/28/2023 01/19/2023 01/10/2023 12/14/2022 12/31/2021  Depression screen PHQ 2/9  Decreased Interest 0 0 0 0 0  Down, Depressed, Hopeless 0 0 0 0 0  PHQ - 2 Score 0 0 0 0 0  Altered sleeping 2 3 0 1 0  Tired, decreased energy 1 2 - - 0  Change in appetite 0 0  0 0 0  Feeling bad or failure about yourself  0 0 0 1 0  Trouble concentrating 0 0 0 0 0  Moving slowly or fidgety/restless 0 1 0 0 0  Suicidal thoughts 0 0 0 0 0  PHQ-9 Score 3 6 0 2 0  Difficult doing work/chores Very difficult Somewhat difficult Not difficult at all Somewhat difficult -    Details           Interpretation of Total Score  Total Score Depression Severity:  1-4 = Minimal depression, 5-9 = Mild depression, 10-14 = Moderate depression, 15-19 = Moderately severe depression, 20-27 = Severe depression   Psychosocial Evaluation and Intervention:  Psychosocial Evaluation - 01/04/23 1410       Psychosocial Evaluation & Interventions   Interventions Encouraged to exercise with the program and follow exercise prescription    Comments [name] is coming to 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 He has a history of spine and neck issues and he can't take his usual medication due to his newer cardiac  meds. This is stressful for him and he is tired of being in pain. He has a history of sleep concerns, but his sleep habits have gotten worse since his MI. He wakes up around 3 and can't go back to sleep until around 7am. He states his doctors can't give him a clear answer as to why. He did has his PCP about trying CBD oil for his peripheral neuropathy and sleep and she cleared him to do so. He is hopeful the CBD and getting more active in the program will help him sleep better. He can't stand for a long period of time related to his spinal issues and is currently not working. He is hopeful the program will help him understand his cardiac health and develop an exercise routine that fits his needs.    Expected Outcomes Short: attend cardiac rehab for education and exercise. Long: develop and maintain positive self care habits.    Continue Psychosocial Services  Follow up required by staff             Psychosocial Re-Evaluation:  Psychosocial Re-Evaluation     Row Name 01/31/23 1415 02/28/23 1409 03/28/23 1413 05/02/23 1504       Psychosocial Re-Evaluation   Current issues with Current Stress Concerns;Current Depression Current Stress Concerns;Current Depression Current Depression;Current Sleep Concerns Current Anxiety/Panic    Comments [name] is doing well mentally.  He is hoping to meet with therapist soon. Still not sleeping well and has not talked with anyone yet.  He got a name from his pain management clinic to see someone that is used to dealing with more chornic diseases. The CBD oil is helping with his neuropathy some.  He is eager to get started talking to someone to see if it would help. [name] is doing well in rehab.  He is still having sleep issues.  He did talk to his pyschiatrist about sleep and exhaustion.  They felt it may be linked to his Earlyne Iba so he is supposed to talk to his cardiologist about that.  He is still using the CBD olil to help with and pain and finds that it is  helpful some.  His PHQ has improved since last month.  He is looking forward to getting back in the pool to swim and water walk. [name] is doing well in rehab. He just got back from the beach and is excited to have rejoined the pool. He has been swimming again  which he is happy to report. He is also glad to report his sleep has improved. His MD increased his current Trazodone dose and this has been successful. [name] expressed significant concerns re anxiety that have worsened recently. He is currently wearing a Zio patch for 2 weeks to assess episodes of waking up at night with palpitations. He reports his sleep has improved greatly, and continues to get 8-9 hours of sleep while taking incr dose of Trazadone. He is not sure what has caused this incr in anxiety but he asked for resourses so he can see a therapist urgently. He has been calling on his own with no success. Offered emergency line to call now and [name] declined. Mental health resources given so that he can find one to schedule, emergency line card given as well if needed. 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 We discussed other measures he could do to reduce anxiety. He already uses the "Calm" app for mindfullness and he does this daily. He takes Valium as needed, but he had cut it in half in the past, he is now going to take his prescribed dose. [name] will let us know at next session if he needs any further help findin a therapist and if he has any further needs. He will continue exercise as this supports his mental health goals as well.    Expected Outcomes Short: Call and get appointment set up Long: Conitnue to work on improving sleep and coping with stress. Short: Talk to doctor about sleep Long: Continue to exercise for mental boost Short: Continue swimmng and continue Trazodone  Long: Continue doing what enjoys to imrpove health, maintain good sleep habits Short: Find therapist and schedule appointment  Long: Maintain healthy mental state/balance     Interventions Encouraged to attend Cardiac Rehabilitation for the exercise;Stress management education -- -- --    Continue Psychosocial Services  Follow up required by staff -- Follow up required by staff Follow up required by staff             Psychosocial Discharge (Final Psychosocial Re-Evaluation):  Psychosocial Re-Evaluation - 05/02/23 1504       Psychosocial Re-Evaluation   Current issues with Current Anxiety/Panic    Comments [name] expressed significant concerns re anxiety that have worsened recently. He is currently wearing a Zio patch for 2 weeks to assess episodes of waking up at night with palpitations. He reports his sleep has improved greatly, and continues to get 8-9 hours of sleep while taking incr dose of Trazadone. He is not sure what has caused this incr in anxiety but he asked for resourses so he can see a therapist urgently. He has been calling on his own with no success. Offered emergency line to call now and [name] declined. Mental health resources given so that he can find one to schedule, emergency line card given as well if needed. 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 We discussed other measures he could do to reduce anxiety. He already uses the "Calm" app for mindfullness and he does this daily. He takes Valium as needed, but he had cut it in half in the past, he is now going to take his prescribed dose. [name] will let us know at next session if he needs any further help findin a therapist and if he has any further needs. He will continue exercise as this supports his mental health goals as well.    Expected Outcomes Short: Find therapist and schedule appointment  Long: Maintain healthy mental state/balance    Continue Psychosocial  Services  Follow up required by staff             Vocational Rehabilitation: Provide vocational rehab assistance to qualifying candidates.   Vocational Rehab Evaluation & Intervention:  Vocational Rehab - 01/04/23 1410       Initial Vocational  Rehab Evaluation & Intervention   Assessment shows need for Vocational Rehabilitation No             Education: Education Goals: Education classes will be provided on a variety of topics geared toward better understanding of heart health and risk factor modification. Participant will state understanding/return demonstration of topics presented as noted by education test scores.  Learning Barriers/Preferences:  Learning Barriers/Preferences - 01/04/23 1407       Learning Barriers/Preferences   Learning Barriers None    Learning Preferences None             General Cardiac Education Topics:  AED/CPR: - Group verbal and written instruction with the use of models to demonstrate the basic use of the AED with the basic ABC's of resuscitation.   Anatomy and Cardiac Procedures: - Group verbal and visual presentation and models provide information about basic cardiac anatomy and function. Reviews the testing methods done to diagnose heart disease and the outcomes of the test results. Describes the treatment choices: Medical Management, Angioplasty, or Coronary Bypass Surgery for treating various heart conditions including Myocardial Infarction, Angina, Valve Disease, and Cardiac Arrhythmias.  Written material given at graduation.   Medication Safety: - Group verbal and visual instruction to review commonly prescribed medications for heart and lung disease. Reviews the medication, class of the drug, and side effects. Includes the steps to properly store meds and maintain the prescription regimen.  Written material given at graduation. Flowsheet Row Cardiac Rehab from 04/20/2023 in [facility] Cardiac and Pulmonary Rehab  Date 01/26/23  Educator SB  Instruction Review Code 1- Verbalizes Understanding       Intimacy: - Group verbal instruction through game format to discuss how heart and lung disease can affect sexual intimacy. Written material given at graduation..   Know Your Numbers  and Heart Failure: - Group verbal and visual instruction to discuss disease risk factors for cardiac and pulmonary disease and treatment options.  Reviews associated critical values for Overweight/Obesity, Hypertension, Cholesterol, and Diabetes.  Discusses basics of heart failure: signs/symptoms and treatments.  Introduces Heart Failure Zone chart for action plan for heart failure.  Written material given at graduation. Flowsheet Row Cardiac Rehab from 04/20/2023 in [facility] Cardiac and Pulmonary Rehab  Date 02/02/23  Educator MS  Instruction Review Code 1- Verbalizes Understanding       Infection Prevention: - Provides verbal and written material to individual with discussion of infection control including proper hand washing and proper equipment cleaning during exercise session. Flowsheet Row Cardiac Rehab from 04/20/2023 in [facility] Cardiac and Pulmonary Rehab  Date 01/19/23  Educator NT  Instruction Review Code 1- Verbalizes Understanding       Falls Prevention: - Provides verbal and written material to individual with discussion of falls prevention and safety. Flowsheet Row Cardiac Rehab from 04/20/2023 in [facility] Cardiac and Pulmonary Rehab  Date 01/19/23  Educator NT  Instruction Review Code 1- Verbalizes Understanding       Other: -Provides group and verbal instruction on various topics (see comments)   Knowledge Questionnaire Score:  Knowledge Questionnaire Score - 01/19/23 1523       Knowledge Questionnaire Score   Pre Score 24/26  Core Components/Risk Factors/Patient Goals at Admission:  Personal Goals and Risk Factors at Admission - 01/19/23 1523       Core Components/Risk Factors/Patient Goals on Admission    Weight Management Yes;Weight Loss    Intervention Weight Management: Provide education and appropriate resources to help participant work on and attain dietary goals.;Weight Management: Develop a combined nutrition and exercise program designed to  reach desired caloric intake, while maintaining appropriate intake of nutrient and fiber, sodium and fats, and appropriate energy expenditure required for the weight goal.;Weight Management/Obesity: Establish reasonable short term and long term weight goals.    Admit Weight 163 lb 12.8 oz (74.3 kg)    Goal Weight: Short Term 158 lb (71.7 kg)    Goal Weight: Long Term 153 lb (69.4 kg)    Expected Outcomes Long Term: Adherence to nutrition and physical activity/exercise program aimed toward attainment of established weight goal;Short Term: Continue to assess and modify interventions until short term weight is achieved;Weight Loss: Understanding of general recommendations for a balanced deficit meal plan, which promotes 1-2 lb weight loss per week and includes a negative energy balance of [phone] kcal/d;Understanding recommendations for meals to include 15-35% energy as protein, 25-35% energy from fat, 35-60% energy from carbohydrates, less than 200mg  of dietary cholesterol, 20-35 gm of total fiber daily;Understanding of distribution of calorie intake throughout the day with the consumption of 4-5 meals/snacks    Hypertension Yes    Intervention Provide education on lifestyle modifcations including regular physical activity/exercise, weight management, moderate sodium restriction and increased consumption of fresh fruit, vegetables, and low fat dairy, alcohol moderation, and smoking cessation.;Monitor prescription use compliance.    Expected Outcomes Short Term: Continued assessment and intervention until BP is < 140/29mm HG in hypertensive participants. < 130/73mm HG in hypertensive participants with diabetes, heart failure or chronic kidney disease.;Long Term: Maintenance of blood pressure at goal levels.    Lipids Yes    Intervention Provide education and support for participant on nutrition & aerobic/resistive exercise along with prescribed medications to achieve LDL 70mg , HDL >40mg .    Expected  Outcomes Short Term: Participant states understanding of desired cholesterol values and is compliant with medications prescribed. Participant is following exercise prescription and nutrition guidelines.;Long Term: Cholesterol controlled with medications as prescribed, with individualized exercise RX and with personalized nutrition plan. Value goals: LDL < 70mg , HDL > 40 mg.             Education:Diabetes - Individual verbal and written instruction to review signs/symptoms of diabetes, desired ranges of glucose level fasting, after meals and with exercise. Acknowledge that pre and post exercise glucose checks will be done for 3 sessions at entry of program.   Core Components/Risk Factors/Patient Goals Review:   Goals and Risk Factor Review     Row Name 01/31/23 1417 02/28/23 1407 03/28/23 1343 05/02/23 1454       Core Components/Risk Factors/Patient Goals Review   Personal Goals Review Weight Management/Obesity;Hypertension Weight Management/Obesity;Hypertension Weight Management/Obesity;Hypertension Weight Management/Obesity    Review [name] is off to a good start in rehab.  He would like to work on losing weight and has already started on that trend.  His wife is helping to keep an eye on his pressures and they check in on occassion at home. [name] is doing well in rehab. His pressures are doing well and his weight is up a little recently but steady for most part.  He is pleased with his progress. [name] continues to do well in rehab. His  BP has remained well controlled per today at 118/62 and per pt report. His wt today 167. He does not have specific weight loss goals but does want to maintain, build strenghth and stamina. He feels his strength is improving. [name] still has concerns that his Brilinta is causing fatigue. He notices fatigue even after he sleeps well. Discussed with [name] talking to his cardiologist about possible medication changes if related to symptoms. [name] continues to do well  in rehab. His goal weight is 165. He says that he feels his best at this weight. Weight today was 168 lb. [name] had questions regarding his cooking that he is going to speak with RD about in his appt. He will work on his diet and continue to follow exercise goals to reach goal weight.    Expected Outcomes Short: Continue to work on weight loss Long: Continue to monitor risk factors Short; COnitnue to work on [employer] maintenance Long: Continue to monitor risk factors Short: Talk to cardiologist about possible s/e of Brlinta  Long: Continue to monitor risk factors Short: Meet with RD as scheduled  Long: Continue to follow exercise Rx             Core Components/Risk Factors/Patient Goals at Discharge (Final Review):   Goals and Risk Factor Review - 05/02/23 1454       Core Components/Risk Factors/Patient Goals Review   Personal Goals Review Weight Management/Obesity    Review [name] continues to do well in rehab. His goal weight is 165. He says that he feels his best at this weight. Weight today was 168 lb. [name] had questions regarding his cooking that he is going to speak with RD about in his appt. He will work on his diet and continue to follow exercise goals to reach goal weight.    Expected Outcomes Short: Meet with RD as scheduled  Long: Continue to follow exercise Rx             ITP Comments:  ITP Comments     Row Name 01/04/23 1423 01/19/23 1520 01/24/23 1345 02/09/23 0920 03/09/23 1113   ITP Comments Initial phone call completed. Diagnosis can be found in [facility] 3/15. EP Orientation scheduled for Wednesday 4/24 at 1:30pm. Completed and gym orientation. Initial ITP created and sent for review to Dr. Bethann Punches, Medical Director. First full day of exercise!  Patient was oriented to gym and equipment including functions, settings, policies, and procedures.  [name] individual exercise prescription and treatment plan were reviewed.  All starting workloads were established based on  the results of the 6 minute walk test done at initial orientation visit.  The plan for exercise progression was also introduced and progression will be customized based on patient's performance and goals. 30 Day review completed. Medical Director ITP review done, changes made as directed, and signed approval by Medical Director.   new to program 30 Day review completed. Medical Director ITP review done, changes made as directed, and signed approval by Medical Director.    Row Name 04/05/23 1446 05/04/23 1155 05/16/23 1659 05/18/23 1316 06/01/23 1103   ITP Comments 30 Day review completed. Medical Director ITP review done, changes made as directed, and signed approval by Medical Director. 30 Day review completed. Medical Director ITP review done, changes made as directed, and signed approval by Medical Director. Pt's wife left a voicemail with rehab that [name] did not get to sleep until after 6 am and will not be at rehab today. [name] called to let  us know he will not be at rehab today. He is still having issues with "waking up feeling like I am having a panic attack" every morning. He completed a zio event monitor and was not given any new recc post results. [name] states he was taken off of metoprolol and he is wondering if that could be related to his "panic attacks" he is feeling every morning. Zio monitor did show 2 non sustained episodes of SVT. Advised pt notify his cardiologist to update with these symptoms. [name] does also see psychiatry for anxiety control. He feels this is possibly d/t coming off metoprolol. He plans to be here for rehab tomorrow (8/22) but will let us know of any changes in the meantime.     [name] also notes that his pain management MD does not want him lifting weights at this time d/t rotator cuff injury in the past, he has had incr neck pain. Advised pt we will make that change to his exercise Rx, he will do range of motion during resistance exercise. 30 Day review completed. Medical  Director ITP review done, changes made as directed, and signed approval by Medical Director.    Row Name 06/01/23 1113 06/16/23 1301         ITP Comments [name] called staff to state he needs to be put on medical hold until his cardiologist appointment on 9/17. He has some questions that need to be answered by his doctor. He also is discussing virtual cardiac rehab with his insurance this afternoon after they mentioned it to him last week. He will call the office after his appointment to decide if he will finish the program or graduate early. [name] called rehab to let us know that he would like to discharge early. He will continue exercise at home and says he feels he has been taught what he needs to know to continue. He has a "lot going on family-wise" and wishes to discharge now. He is appreciative of the program and understands if he would return a new referral would be required.               Comments: Discharge ITP

## 2023-06-16 NOTE — Telephone Encounter (Signed)
[name] returned call to rehab and states that he would like to discharge early from cardiac rehab. He will continue exercise at home and says he feels he has been taught what he needs to know to continue. He has a "lot going on family-wise" and wishes to discharge now. He is appreciative of the program and understands if he would return a new referral would be required.

## 2023-06-16 NOTE — Progress Notes (Signed)
Discharge Summary:  [name]  (DOB: 29-Nov-1960)  [name] decided to discharge early from cardiac rehab due to personal reasons. He will continue exercise at home as discussed in home exercise with walking and the elliptical. [name] completed 24/36 sessions.    6 Minute Walk     Row Name 01/19/23 1525         6 Minute Walk   Phase Initial     Distance 1750 feet     Walk Time 6 minutes     # of Rest Breaks 0     MPH 3.31     METS 4.52     RPE 11     Perceived Dyspnea  1     VO2 Peak 15.83     Symptoms Yes (comment)     Comments Neck Pain 3/10, Feet Pain 4/10     Resting HR 78 bpm     Resting BP 120/78     Resting Oxygen Saturation  97 %     Exercise Oxygen Saturation  during 6 min walk 95 %     Max Ex. HR 107 bpm     Max Ex. BP 134/74     2 Minute Post BP 112/68

## 2023-06-27 ENCOUNTER — Ambulatory Visit: Payer: PPO | Admitting: Clinical

## 2023-06-27 DIAGNOSIS — F419 Anxiety disorder, unspecified: Secondary | ICD-10-CM

## 2023-06-27 NOTE — Progress Notes (Signed)
[facility] Counselor/Therapist Progress Note  Patient ID: [name], MRN: [id number]    Date: 06/27/23  Time Spent: 1:33  pm - 2:25 pm : 52 Minutes  Treatment Type: Individual Therapy.  Reported Symptoms: Patient reported he experiences chronic pain and is experiencing pain today  Mental Status Exam: Appearance:  Neat and Well Groomed     Behavior: Appropriate  Motor: Normal  Speech/Language:  Clear and Coherent  Affect: Appropriate  Mood: normal  Thought process: normal  Thought content:   WNL  Sensory/Perceptual disturbances:   Patient reported experiencing pain during today's session  Orientation: oriented to person, place, and situation  Attention: Good  Concentration: Good  Memory: WNL  Fund of knowledge:  Good  Insight:   Good  Judgment:  Good  Impulse Control: Good   Risk Assessment: Danger to Self:  No Patient denied current suicidal ideation  Self-injurious Behavior: No Danger to Others: No Patient denied current homicidal ideation Duty to Warn:no Physical Aggression / Violence:No  Access to Firearms a concern: No  Gang Involvement:No   Subjective:  Patient reported two of his family members and one friend is in the hospital. 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 Patient reported he has been experiencing pain for 30 years. Patient reported he experiences challenges with his family members understanding the impact pain has on patient's ability to participate in activities and patient's schedule. Patient reported he is exploring local support resources for chronic pain. Patient reported he was informed by others twice this week that patient utilizes a "demeaning tone" when communicating with others. Patient reported a goal "to be happy". Patient reported he does not feel like himself at times. Patient stated, "I want to accomplish something new" in response to patient's  goals. Patient stated, he has considered "what can I do from my house to help people". Patient stated, "I feel like myself today" in response to patient's current mood.   Interventions: Motivational Interviewing. Clinician conducted session via caregility video from clinician's home office. Patient provided verbal consent to proceed with telehealth session and is aware of limitations of telephone or video visits. Patient participated in session from patient's home. Clinician utilized motivational interviewing to explore potential goals for therapy. Clinician utilized a task centered approach in collaboration with patient to begin to develop goals for therapy. Patient participated in development of the following goals and agreed to the following goals for therapy. Goals will be finalized during patient's follow up appointment on 07/13/23. Clinician requested for homework patient clarify patient's perception of patient's goal "to be happy".   Collaboration of Care: Other not required at this time  Diagnosis:  Anxiety disorder, unspecified type   Plan: Patient is to utilize [employer] Therapy, thought re-framing, relaxation techniques, opposite action, mindfulness and coping strategies to decrease symptoms associated with their diagnosis. Frequency: bi-weekly  Modality: individual     Long-term goal:   Reduce overall level, frequency, and intensity of the feelings of anxiety as evidenced by decreased increased heart rate, shortness of breath, decreased energy, loss of interest from 7 days/week to 0 to 1 days/week per patient report for at least 3 consecutive months. Target Date: 06/26/24  Progress: 0   Short-term goal:  Increase patient's awareness of patient's verbal and nonverbal communication with others and how patient's communication is perceived by others Target Date: 12/25/23  Progress: 0   Develop and implement healthy communication strategies for patient to utilize when  expressing his thoughts and feelings to others   Target Date: 12/25/23  Progress: 0   Develop and implement coping strategies for patient to utilize to decrease the negative impact chronic pain has on patient's ability to participate in family activities Target Date: 12/25/23  Progress: 0   Maintain physical activity 3-5 days per week  Target Date: 12/25/23  Progress: 0   Increase patient's participation in activities patient enjoys   Target Date: 12/25/23  Progress: 0                Doree Barthel, LCSW

## 2023-06-28 ENCOUNTER — Encounter: Payer: Self-pay | Admitting: *Deleted

## 2023-06-28 DIAGNOSIS — I214 Non-ST elevation (NSTEMI) myocardial infarction: Secondary | ICD-10-CM

## 2023-06-28 DIAGNOSIS — Z955 Presence of coronary angioplasty implant and graft: Secondary | ICD-10-CM

## 2023-06-28 NOTE — Progress Notes (Signed)
Cardiac Individual Treatment Plan  Patient Details  Name: [name] MRN: [id number] Date of Birth: 1960/12/04 Referring Provider:   Flowsheet Row Cardiac Rehab from 01/19/2023 in [facility] Cardiac and Pulmonary Rehab  Referring Provider Dr. Bryan Lemma, MD       Initial Encounter Date:  Flowsheet Row Cardiac Rehab from 01/19/2023 in [facility] Cardiac and Pulmonary Rehab  Date 01/19/23       Visit Diagnosis: NSTEMI (non-ST elevated myocardial infarction) [facility])  Status post coronary artery stent placement  [name] Home Medications on Admission:  Current Outpatient Medications:    acyclovir (ZOVIRAX) 800 MG tablet, Take 1 tablet (800 mg total) by mouth 2 (two) times daily., Disp: 180 tablet, Rfl: 3   amLODipine (NORVASC) 10 MG tablet, Take 1 tablet (10 mg total) by mouth every evening., Disp: 90 tablet, Rfl: 1   aspirin EC 81 MG tablet, Take 1 tablet (81 mg total) by mouth daily. Swallow whole., Disp: 30 tablet, Rfl: 12   atorvastatin (LIPITOR) 80 MG tablet, Take 1 tablet (80 mg total) by mouth daily., Disp: 90 tablet, Rfl: 2   baclofen (LIORESAL) 20 MG tablet, Take 1 tablet (20 mg total) by mouth every 8 (eight) hours as needed for muscle spasms. (Patient not taking: Reported on 06/14/2023), Disp: 60 each, Rfl: 5   cetirizine (ZYRTEC) 10 MG tablet, Take 10 mg by mouth daily., Disp: , Rfl:    diazepam (VALIUM) 10 MG tablet, Take 10 mg by mouth 3 (three) times daily., Disp: , Rfl:    diclofenac sodium (VOLTAREN) 1 % GEL, Apply 1 application  topically daily as needed (For pain)., Disp: , Rfl:    EPINEPHrine 0.3 mg/0.3 mL IJ SOAJ injection, USE AS DIRECTED, Disp: 2 each, Rfl: 1   fentaNYL (DURAGESIC - DOSED MCG/HR) 50 MCG/HR, Place 50 mcg onto the skin every other day., Disp: , Rfl:    fluticasone (FLONASE) 50 MCG/ACT nasal spray, USE TWO SPRAYS IN EACH NOSTRIL DAILY, Disp: 16 g, Rfl: 11   lactulose, encephalopathy, (CHRONULAC) 10 GM/15ML SOLN, Take 20 g by mouth at bedtime., Disp: ,  Rfl:    nitroGLYCERIN (NITROSTAT) 0.4 MG SL tablet, Place 1 tablet (0.4 mg total) under the tongue every 5 (five) minutes as needed for chest pain., Disp: 100 tablet, Rfl: 3   oxyCODONE (OXY IR/ROXICODONE) 5 MG immediate release tablet, Take 5 mg by mouth every 6 (six) hours as needed for moderate pain., Disp: , Rfl: 0   promethazine (PHENERGAN) 25 MG tablet, Take 12.5 mg by mouth every 6 (six) hours as needed for nausea or vomiting., Disp: , Rfl:    scopolamine (TRANSDERM SCOP, 1.5 MG,) 1 MG/3DAYS, Place 1 patch (1.5 mg total) onto the skin every 3 (three) days. (Patient not taking: Reported on 06/14/2023), Disp: 10 patch, Rfl: 0   ticagrelor (BRILINTA) 90 MG TABS tablet, Take 1 tablet (90 mg total) by mouth 2 (two) times daily., Disp: 60 tablet, Rfl: 11   traZODone (DESYREL) 100 MG tablet, Take 50 mg by mouth at bedtime., Disp: , Rfl:    triamcinolone cream (KENALOG) 0.1 %, Apply 1 application topically 2 (two) times daily., Disp: 30 g, Rfl: 0  Past Medical History: Past Medical History:  Diagnosis Date   ADHD (attention deficit hyperactivity disorder), inattentive type    sees Dr. Deboraha Sprang in [place], [place]    Anxiety    sees Dr. Deboraha Sprang in [place], [place]    Arthritis    Benign enlargement of prostate    sees  [facility]    CAD- 1 Vessel, DES PCI LCx. (NSTEMI) 12/10/2022   LHC 12/10/2022:  Minimal LM & LAD disease. Mid Cx 99% (subtotal occlusion) =>  PCI using a 2.25 x 24 mm Synergy DES; pRCA 30% & mRCA 50%.      Carpal tunnel syndrome on left    sees Dr. Kathie Dike at [facility]    Chronic pain    sees Dr. Thyra Breed (meds) and Altamease Oiler (accupuncture)    Depression    sees Dr. Deboraha Sprang   Functional impotence    Hyperlipidemia with target LDL less than 70 07/09/2014   Hypogonadism in male    sees Carollee Herter (a [place]) at [facility]    Insomnia    Kidney stones    NSTEMI (non-ST elevated myocardial infarction) (HCC) 12/10/2022   Admitted with stuttering  chest pain, troponin elevation. => 9 9% LCx=> DES stent.  Normal EF on echo.   Sinusitis, acute 07/17/2016   Urticaria due to cold     Tobacco Use: Social History   Tobacco Use  Smoking Status Former  Smokeless Tobacco Never  Tobacco Comments   quit 35 years    Labs: Review Flowsheet  More data exists      Latest Ref Rng & Units 04/03/2019 06/05/2020 11/09/2021 12/10/2022 12/11/2022  Labs for ITP Cardiac and Pulmonary Rehab  Cholestrol 0 - 200 mg/dL 528  413  244  010  -  LDL (calc) 0 - 99 mg/dL 272  536  644  034  -  HDL-C >40 mg/dL 74.25  58  95.63  61  -  Trlycerides <150 mg/dL 875.6  433  295.1  71  -  Hemoglobin A1c 4.8 - 5.6 % - 5.8  6.0  - 5.9     Details             Exercise Target Goals: Exercise Program Goal: Individual exercise prescription set using results from initial 6 min walk test and THRR while considering  patient's activity barriers and safety.   Exercise Prescription Goal: Initial exercise prescription builds to 30-45 minutes a day of aerobic activity, 2-3 days per week.  Home exercise guidelines will be given to patient during program as part of exercise prescription that the participant will acknowledge.   Education: Aerobic Exercise: - Group verbal and visual presentation on the components of exercise prescription. Introduces F.I.T.T principle from ACSM for exercise prescriptions.  Reviews F.I.T.T. principles of aerobic exercise including progression. Written material given at graduation. Flowsheet Row Cardiac Rehab from 04/20/2023 in [facility] Cardiac and Pulmonary Rehab  Education need identified 01/19/23       Education: Resistance Exercise: - Group verbal and visual presentation on the components of exercise prescription. Introduces F.I.T.T principle from ACSM for exercise prescriptions  Reviews F.I.T.T. principles of resistance exercise including progression. Written material given at graduation.    Education: Exercise & Equipment Safety: -  Individual verbal instruction and demonstration of equipment use and safety with use of the equipment. Flowsheet Row Cardiac Rehab from 04/20/2023 in [facility] Cardiac and Pulmonary Rehab  Date 01/19/23  Educator NT  Instruction Review Code 1- Verbalizes Understanding       Education: Exercise Physiology & General Exercise Guidelines: - Group verbal and written instruction with models to review the exercise physiology of the cardiovascular system and associated critical values. Provides general exercise guidelines with specific guidelines to those with heart or lung disease.  Flowsheet Row Cardiac Rehab from 04/20/2023 in [facility] Cardiac and Pulmonary Rehab  Date 02/23/23  Educator [facility]  Instruction Review Code 1- [employer] Understanding       Education: Flexibility, Balance, Mind/Body Relaxation: - Group verbal and visual presentation with interactive activity on the components of exercise prescription. Introduces F.I.T.T principle from ACSM for exercise prescriptions. Reviews F.I.T.T. principles of flexibility and balance exercise training including progression. Also discusses the mind body connection.  Reviews various relaxation techniques to help reduce and manage stress (i.e. Deep breathing, progressive muscle relaxation, and visualization). Balance handout provided to take home. Written material given at graduation.   Activity Barriers & Risk Stratification:   6 Minute Walk:   Oxygen Initial Assessment:   Oxygen Re-Evaluation:   Oxygen Discharge (Final Oxygen Re-Evaluation):   Initial Exercise Prescription:   Perform Capillary Blood Glucose checks as needed.  Exercise Prescription Changes:   Exercise Prescription Changes     Row Name 05/16/23 1000 05/31/23 1400           Response to Exercise   Blood Pressure (Admit) 142/72 102/60      Blood Pressure (Exit) 124/62 100/62      Heart Rate (Admit) 87 bpm 86 bpm      Heart Rate (Exercise) 108 bpm 117 bpm      Heart Rate  (Exit) 105 bpm 101 bpm      Oxygen Saturation (Admit) 95 % 98 %      Oxygen Saturation (Exercise) 95 % 95 %      Oxygen Saturation (Exit) 95 % 97 %      Rating of Perceived Exertion (Exercise) 11 12      Symptoms None None      Duration Continue with 30 min of aerobic exercise without signs/symptoms of physical distress. Continue with 30 min of aerobic exercise without signs/symptoms of physical distress.      Intensity THRR unchanged THRR unchanged        Progression   Progression Continue to progress workloads to maintain intensity without signs/symptoms of physical distress. Continue to progress workloads to maintain intensity without signs/symptoms of physical distress.      Average METs 3.49 2.9        Resistance Training   Training Prescription Yes Yes      Weight 7 lb 7 lb      Reps 10-15 10-15        Interval Training   Interval Training No No        Oxygen   Oxygen Continuous --        NuStep   Level 5 6      Minutes 15 15      METs 3.7 3.9        Arm Ergometer   Level -- 4      Minutes -- 15      METs -- 1        Track   Laps 42 45      METs 3.28 3.45        Home Exercise Plan   Plans to continue exercise at Home (comment)  walking, elliptical Home (comment)  walking, elliptical      Frequency Add 3 additional days to program exercise sessions. Add 3 additional days to program exercise sessions.      Initial Home Exercises Provided 01/31/23 01/31/23        Oxygen   Maintain Oxygen Saturation 88% or higher 88% or higher               Exercise Comments:   Exercise  Comments     Row Name 05/18/23 1317           Exercise Comments [name] called to let us know that his pain management MD does not want him lifting weights at this time d/t rotator cuff injury in the past, he has had incr neck pain. Advised pt we will make that change to his exercise Rx, he will do range of motion during resistance exercise.                Exercise Goals and  Review:   Exercise Goals Re-Evaluation :  Exercise Goals Re-Evaluation     Row Name 05/02/23 1444 05/16/23 1024 05/31/23 1414 06/14/23 1556       Exercise Goal Re-Evaluation   Exercise Goals Review Increase Strength and Stamina Increase Physical Activity;Increase Strength and Stamina;Understanding of Exercise Prescription Increase Physical Activity;Increase Strength and Stamina;Understanding of Exercise Prescription Increase Physical Activity;Increase Strength and Stamina;Understanding of Exercise Prescription    Comments [name] continues to attend rehab sessions. He is using his XR machine at home at level 6 which he says is harder than level 6 at rehab. He has been using the pool as well for exercise. He feels that his strength is improving some although he still wants to improve more. Encouraged to continue exercise program and incr workloads as he is able. [name] continues to do well in rehab. He has only attended one session in the past two weeks. In that one visit he slightly decreased his track laps from 46 to 42, and maintained intensity at level 5 on the T4 nustep. [name] continues to do well in rehab as he returns more regularly. He has increased his track laps back up to 45 and increased to level 6 on the T4. We will continue to monitor his progress in the program. [name] has not attended rehab during since the last review. He has let us know that he is to be placed on medical hold until 9/18. We will continue to monitor his progress upon return to the program.    Expected Outcomes Short: Attend all scheduled sessions and try to incr workload  Long: Continue exercise progression as tolerated Short: Return to program consistently. Long: Continue exercise to improve strength and stamina. Short: Continue to progressively increase track laps and T4 workload as he becomes more consistent in rehab. Long: Continue exercise to improve strength and stamina. Short: Return to the program. Long: Continue to  exercise to improve strength and stamina.             Discharge Exercise Prescription (Final Exercise Prescription Changes):  Exercise Prescription Changes - 05/31/23 1400       Response to Exercise   Blood Pressure (Admit) 102/60    Blood Pressure (Exit) 100/62    Heart Rate (Admit) 86 bpm    Heart Rate (Exercise) 117 bpm    Heart Rate (Exit) 101 bpm    Oxygen Saturation (Admit) 98 %    Oxygen Saturation (Exercise) 95 %    Oxygen Saturation (Exit) 97 %    Rating of Perceived Exertion (Exercise) 12    Symptoms None    Duration Continue with 30 min of aerobic exercise without signs/symptoms of physical distress.    Intensity THRR unchanged      Progression   Progression Continue to progress workloads to maintain intensity without signs/symptoms of physical distress.    Average METs 2.9      Resistance Training   Training Prescription Yes  Weight 7 lb    Reps 10-15      Interval Training   Interval Training No      NuStep   Level 6    Minutes 15    METs 3.9      Arm Ergometer   Level 4    Minutes 15    METs 1      Track   Laps 45    METs 3.45      Home Exercise Plan   Plans to continue exercise at Home (comment)   walking, elliptical   Frequency Add 3 additional days to program exercise sessions.    Initial Home Exercises Provided 01/31/23      Oxygen   Maintain Oxygen Saturation 88% or higher             Nutrition:  Target Goals: Understanding of nutrition guidelines, daily intake of sodium 1500mg , cholesterol 200mg , calories 30% from fat and 7% or less from saturated fats, daily to have 5 or more servings of fruits and vegetables.  Education: All About Nutrition: -Group instruction provided by verbal, written material, interactive activities, discussions, models, and posters to present general guidelines for heart healthy nutrition including fat, fiber, MyPlate, the role of sodium in heart healthy nutrition, utilization of the nutrition label,  and utilization of this knowledge for meal planning. Follow up email sent as well. Written material given at graduation.   Biometrics:    Nutrition Therapy Plan and Nutrition Goals:   Nutrition Assessments:  MEDIFICTS Score Key: >=70 Need to make dietary changes  40-70 Heart Healthy Diet <= 40 Therapeutic Level Cholesterol Diet  Flowsheet Row Cardiac Rehab from 01/19/2023 in [facility] Cardiac and Pulmonary Rehab  Picture Your Plate Total Score on Admission 77      Picture Your Plate Scores: <40 Unhealthy dietary pattern with much room for improvement. 41-50 Dietary pattern unlikely to meet recommendations for good health and room for improvement. 51-60 More healthful dietary pattern, with some room for improvement.  >60 Healthy dietary pattern, although there may be some specific behaviors that could be improved.    Nutrition Goals Re-Evaluation:  Nutrition Goals Re-Evaluation     Row Name 05/02/23 1453             Goals   Comment Pt had to cancel 7/3 appt. Rescheduled RD appt for 8/7.       Expected Outcome Short: Meet with dieatician  Long: Maintain goals set with dietician.                Nutrition Goals Discharge (Final Nutrition Goals Re-Evaluation):  Nutrition Goals Re-Evaluation - 05/02/23 1453       Goals   Comment Pt had to cancel 7/3 appt. Rescheduled RD appt for 8/7.    Expected Outcome Short: Meet with dieatician  Long: Maintain goals set with dietician.             Psychosocial: Target Goals: Acknowledge presence or absence of significant depression and/or stress, maximize coping skills, provide positive support system. Participant is able to verbalize types and ability to use techniques and skills needed for reducing stress and depression.   Education: Stress, Anxiety, and Depression - Group verbal and visual presentation to define topics covered.  Reviews how body is impacted by stress, anxiety, and depression.  Also discusses healthy ways to  reduce stress and to treat/manage anxiety and depression.  Written material given at graduation. Flowsheet Row Cardiac Rehab from 04/20/2023 in [facility] Cardiac and Pulmonary Rehab  Date 02/16/23  Educator KW  Instruction Review Code 1- [employer] Understanding       Education: Sleep Hygiene -Provides group verbal and written instruction about how sleep can affect your health.  Define sleep hygiene, discuss sleep cycles and impact of sleep habits. Review good sleep hygiene tips.    Initial Review & Psychosocial Screening:   Quality of Life Scores:   Scores of 19 and below usually indicate a poorer quality of life in these areas.  A difference of  2-3 points is a clinically meaningful difference.  A difference of 2-3 points in the total score of the Quality of Life Index has been associated with significant improvement in overall quality of life, self-image, physical symptoms, and general health in studies assessing change in quality of life.  PHQ-9: Review Flowsheet  More data exists      02/28/2023 01/19/2023 01/10/2023 12/14/2022 12/31/2021  Depression screen PHQ 2/9  Decreased Interest 0 0 0 0 0  Down, Depressed, Hopeless 0 0 0 0 0  PHQ - 2 Score 0 0 0 0 0  Altered sleeping 2 3 0 1 0  Tired, decreased energy 1 2 - - 0  Change in appetite 0 0 0 0 0  Feeling bad or failure about yourself  0 0 0 1 0  Trouble concentrating 0 0 0 0 0  Moving slowly or fidgety/restless 0 1 0 0 0  Suicidal thoughts 0 0 0 0 0  PHQ-9 Score 3 6 0 2 0  Difficult doing work/chores Very difficult Somewhat difficult Not difficult at all Somewhat difficult -    Details           Interpretation of Total Score  Total Score Depression Severity:  1-4 = Minimal depression, 5-9 = Mild depression, 10-14 = Moderate depression, 15-19 = Moderately severe depression, 20-27 = Severe depression   Psychosocial Evaluation and Intervention:   Psychosocial Re-Evaluation:  Psychosocial Re-Evaluation     Row Name  05/02/23 1504             Psychosocial Re-Evaluation   Current issues with Current Anxiety/Panic       Comments [name] expressed significant concerns re anxiety that have worsened recently. He is currently wearing a Zio patch for 2 weeks to assess episodes of waking up at night with palpitations. He reports his sleep has improved greatly, and continues to get 8-9 hours of sleep while taking incr dose of Trazadone. He is not sure what has caused this incr in anxiety but he asked for resourses so he can see a therapist urgently. He has been calling on his own with no success. Offered emergency line to call now and [name] declined. Mental health resources given so that he can find one to schedule, emergency line card given as well if needed. 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 We discussed other measures he could do to reduce anxiety. He already uses the "Calm" app for mindfullness and he does this daily. He takes Valium as needed, but he had cut it in half in the past, he is now going to take his prescribed dose. [name] will let us know at next session if he needs any further help findin a therapist and if he has any further needs. He will continue exercise as this supports his mental health goals as well.       Expected Outcomes Short: Find therapist and schedule appointment  Long: Maintain healthy mental state/balance       Continue Psychosocial Services  Follow up  required by staff                Psychosocial Discharge (Final Psychosocial Re-Evaluation):  Psychosocial Re-Evaluation - 05/02/23 1504       Psychosocial Re-Evaluation   Current issues with Current Anxiety/Panic    Comments [name] expressed significant concerns re anxiety that have worsened recently. He is currently wearing a Zio patch for 2 weeks to assess episodes of waking up at night with palpitations. He reports his sleep has improved greatly, and continues to get 8-9 hours of sleep while taking incr dose of Trazadone. He is not sure what  has caused this incr in anxiety but he asked for resourses so he can see a therapist urgently. He has been calling on his own with no success. Offered emergency line to call now and [name] declined. Mental health resources given so that he can find one to schedule, emergency line card given as well if needed. 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 We discussed other measures he could do to reduce anxiety. He already uses the "Calm" app for mindfullness and he does this daily. He takes Valium as needed, but he had cut it in half in the past, he is now going to take his prescribed dose. [name] will let us know at next session if he needs any further help findin a therapist and if he has any further needs. He will continue exercise as this supports his mental health goals as well.    Expected Outcomes Short: Find therapist and schedule appointment  Long: Maintain healthy mental state/balance    Continue Psychosocial Services  Follow up required by staff             Vocational Rehabilitation: Provide vocational rehab assistance to qualifying candidates.   Vocational Rehab Evaluation & Intervention:   Education: Education Goals: Education classes will be provided on a variety of topics geared toward better understanding of heart health and risk factor modification. Participant will state understanding/return demonstration of topics presented as noted by education test scores.  Learning Barriers/Preferences:   General Cardiac Education Topics:  AED/CPR: - Group verbal and written instruction with the use of models to demonstrate the basic use of the AED with the basic ABC's of resuscitation.   Anatomy and Cardiac Procedures: - Group verbal and visual presentation and models provide information about basic cardiac anatomy and function. Reviews the testing methods done to diagnose heart disease and the outcomes of the test results. Describes the treatment choices: Medical Management, Angioplasty, or Coronary  Bypass Surgery for treating various heart conditions including Myocardial Infarction, Angina, Valve Disease, and Cardiac Arrhythmias.  Written material given at graduation.   Medication Safety: - Group verbal and visual instruction to review commonly prescribed medications for heart and lung disease. Reviews the medication, class of the drug, and side effects. Includes the steps to properly store meds and maintain the prescription regimen.  Written material given at graduation. Flowsheet Row Cardiac Rehab from 04/20/2023 in [facility] Cardiac and Pulmonary Rehab  Date 01/26/23  Educator SB  Instruction Review Code 1- Verbalizes Understanding       Intimacy: - Group verbal instruction through game format to discuss how heart and lung disease can affect sexual intimacy. Written material given at graduation..   Know Your Numbers and Heart Failure: - Group verbal and visual instruction to discuss disease risk factors for cardiac and pulmonary disease and treatment options.  Reviews associated critical values for Overweight/Obesity, Hypertension, Cholesterol, and Diabetes.  Discusses basics of heart failure: signs/symptoms  and treatments.  Introduces Heart Failure Zone chart for action plan for heart failure.  Written material given at graduation. Flowsheet Row Cardiac Rehab from 04/20/2023 in [facility] Cardiac and Pulmonary Rehab  Date 02/02/23  Educator MS  Instruction Review Code 1- Verbalizes Understanding       Infection Prevention: - Provides verbal and written material to individual with discussion of infection control including proper hand washing and proper equipment cleaning during exercise session. Flowsheet Row Cardiac Rehab from 04/20/2023 in [facility] Cardiac and Pulmonary Rehab  Date 01/19/23  Educator NT  Instruction Review Code 1- Verbalizes Understanding       Falls Prevention: - Provides verbal and written material to individual with discussion of falls prevention and  safety. Flowsheet Row Cardiac Rehab from 04/20/2023 in [facility] Cardiac and Pulmonary Rehab  Date 01/19/23  Educator NT  Instruction Review Code 1- Verbalizes Understanding       Other: -Provides group and verbal instruction on various topics (see comments)   Knowledge Questionnaire Score:   Core Components/Risk Factors/Patient Goals at Admission:   Education:Diabetes - Individual verbal and written instruction to review signs/symptoms of diabetes, desired ranges of glucose level fasting, after meals and with exercise. Acknowledge that pre and post exercise glucose checks will be done for 3 sessions at entry of program.   Core Components/Risk Factors/Patient Goals Review:   Goals and Risk Factor Review     Row Name 05/02/23 1454             Core Components/Risk Factors/Patient Goals Review   Personal Goals Review Weight Management/Obesity       Review [name] continues to do well in rehab. His goal weight is 165. He says that he feels his best at this weight. Weight today was 168 lb. [name] had questions regarding his cooking that he is going to speak with RD about in his appt. He will work on his diet and continue to follow exercise goals to reach goal weight.       Expected Outcomes Short: Meet with RD as scheduled  Long: Continue to follow exercise Rx                Core Components/Risk Factors/Patient Goals at Discharge (Final Review):   Goals and Risk Factor Review - 05/02/23 1454       Core Components/Risk Factors/Patient Goals Review   Personal Goals Review Weight Management/Obesity    Review [name] continues to do well in rehab. His goal weight is 165. He says that he feels his best at this weight. Weight today was 168 lb. [name] had questions regarding his cooking that he is going to speak with RD about in his appt. He will work on his diet and continue to follow exercise goals to reach goal weight.    Expected Outcomes Short: Meet with RD as scheduled  Long: Continue  to follow exercise Rx             ITP Comments:  ITP Comments     Row Name 05/04/23 1155 05/16/23 1659 05/18/23 1316 06/01/23 1103 06/01/23 1113   ITP Comments 30 Day review completed. Medical Director ITP review done, changes made as directed, and signed approval by Medical Director. Pt's wife left a voicemail with rehab that [name] did not get to sleep until after 6 am and will not be at rehab today. [name] called to let us know he will not be at rehab today. He is still having issues with "waking up feeling like I am  having a panic attack" every morning. He completed a zio event monitor and was not given any new recc post results. [name] states he was taken off of metoprolol and he is wondering if that could be related to his "panic attacks" he is feeling every morning. Zio monitor did show 2 non sustained episodes of SVT. Advised pt notify his cardiologist to update with these symptoms. [name] does also see psychiatry for anxiety control. He feels this is possibly d/t coming off metoprolol. He plans to be here for rehab tomorrow (8/22) but will let us know of any changes in the meantime.     [name] also notes that his pain management MD does not want him lifting weights at this time d/t rotator cuff injury in the past, he has had incr neck pain. Advised pt we will make that change to his exercise Rx, he will do range of motion during resistance exercise. 30 Day review completed. Medical Director ITP review done, changes made as directed, and signed approval by Medical Director. [name] called staff to state he needs to be put on medical hold until his cardiologist appointment on 9/17. He has some questions that need to be answered by his doctor. He also is discussing virtual cardiac rehab with his insurance this afternoon after they mentioned it to him last week. He will call the office after his appointment to decide if he will finish the program or graduate early.    Row Name 06/16/23 1301 06/28/23 0833          ITP Comments [name] called rehab to let us know that he would like to discharge early. He will continue exercise at home and says he feels he has been taught what he needs to know to continue. He has a "lot going on family-wise" and wishes to discharge now. He is appreciative of the program and understands if he would return a new referral would be required. Discharged               Comments: DISCHARGE ITP

## 2023-07-04 ENCOUNTER — Encounter: Payer: Self-pay | Admitting: Cardiology

## 2023-07-06 DIAGNOSIS — M5412 Radiculopathy, cervical region: Secondary | ICD-10-CM | POA: Diagnosis not present

## 2023-07-06 DIAGNOSIS — G894 Chronic pain syndrome: Secondary | ICD-10-CM | POA: Diagnosis not present

## 2023-07-06 DIAGNOSIS — G5752 Tarsal tunnel syndrome, left lower limb: Secondary | ICD-10-CM | POA: Diagnosis not present

## 2023-07-06 DIAGNOSIS — Z79891 Long term (current) use of opiate analgesic: Secondary | ICD-10-CM | POA: Diagnosis not present

## 2023-07-10 NOTE — Progress Notes (Unsigned)
Cardiology Clinic Note   Patient Name: [name] Date of Encounter: 07/10/2023  Primary Care Provider:  Nelwyn Salisbury, MD Primary Cardiologist:  Bryan Lemma, MD  Patient Profile     62 y.o. male with PMH of CAD, anxiety/depression, chronic pain syndrome, ADHD, tobacco abuse and hyperlipidemia.  He has multiple allergies.  He was admitted in March 2024 with NSTEMI.  Subsequent cardiac catheterization revealed patent left main and LAD, eccentric nonobstructive mid RCA stenosis with diffuse plaquing, severe subtotal 99% stenosis in mid left circumflex artery with sequential lesion into the branching OM, this was treated with 2.25 x 24 mm Synergy DES.  EF was 55%.  Echocardiogram obtained on 12/11/2022 showed EF 60 to 65%, grade 1 DD, no regional wall motion abnormality, no significant valve issue.  Postprocedure, patient was placed on aspirin and Brilinta.  Last seen in the office by Azalee Course, PA on 06/14/2023.  No medication changes were made.  Past Medical History    Past Medical History:  Diagnosis Date   ADHD (attention deficit hyperactivity disorder), inattentive type    sees Dr. Deboraha Sprang in [place], [place]    Anxiety    sees Dr. Deboraha Sprang in [place], [place]    Arthritis    Benign enlargement of prostate    sees [facility]    CAD- 1 Vessel, DES PCI LCx. (NSTEMI) 12/10/2022   LHC 12/10/2022:  Minimal LM & LAD disease. Mid Cx 99% (subtotal occlusion) =>  PCI using a 2.25 x 24 mm Synergy DES; pRCA 30% & mRCA 50%.      Carpal tunnel syndrome on left    sees Dr. Kathie Dike at [facility]    Chronic pain    sees Dr. Thyra Breed (meds) and Altamease Oiler (accupuncture)    Depression    sees Dr. Deboraha Sprang   Functional impotence    Hyperlipidemia with target LDL less than 70 07/09/2014   Hypogonadism in male    sees Carollee Herter (a [place]) at [facility]    Insomnia    Kidney stones    NSTEMI (non-ST elevated myocardial infarction) (HCC) 12/10/2022   Admitted  with stuttering chest pain, troponin elevation. => 9 9% LCx=> DES stent.  Normal EF on echo.   Sinusitis, acute 07/17/2016   Urticaria due to cold    Past Surgical History:  Procedure Laterality Date   APPENDECTOMY     CERVICAL FUSION  2008   C6-7 per Dr. Sharolyn Douglas   COLONOSCOPY  11/06/2009   per Dr. Dorena Cookey, no polyps, repeat in 10 yrs    CORONARY STENT INTERVENTION N/A 12/10/2022   Procedure: CORONARY STENT INTERVENTION;  Surgeon: Tonny Bollman, MD;  Location: [facility] INVASIVE CV LAB;  Service: Cardiovascular; Mid LCx 99 %=> 0%: 2.25 x 24 Synergy DES.   FOOT SURGERY Right    x 2   LEFT HEART CATH AND CORONARY ANGIOGRAPHY N/A 12/10/2022   Procedure: LEFT HEART CATH AND CORONARY ANGIOGRAPHY;  Surgeon: Tonny Bollman, MD;  Location: [facility] INVASIVE CV LAB;  Service: CV:: NSTEMI: Minimal LM & LAD disease. Mid Cx 99% (subtotal occlusion) => DES PCI; pRCA 30% & mRCA 50%.   LUMBAR DISC SURGERY     L5-S1, C 6-6   TRANSTHORACIC ECHOCARDIOGRAM  12/11/2022   ( non-STEMI) normal LV size and function.  EF 60 to 65%.  No RWMA.  GR 1 DD.  Normal RV size and function.  Normal PAP and RAP.  Normal valves.--Normal echo.    Allergies  Allergies  Allergen Reactions   Fire Ant Anaphylaxis    Has Epi pen Has Epi pen    Metoprolol Succinate [Metoprolol] Shortness Of Breath    Fatigue and constipation   Codeine     REACTION: ITCHING   Cyclobenzaprine Other (See Comments)    Nightmares   Docusate Sodium     swelling   Dulcolax Stool Softener [Dss]     swelling   Gabapentin     Migraine  06/14/23 Patient states medication causes confusion as well   Ketorolac     Other reaction(s): Vomiting (intolerance)   Ketorolac Tromethamine Other (See Comments)    REACTION: SEVERE VOMMITING   Lisinopril     Cough & chest tightness   Methadone Other (See Comments)    Loss of appetite   Pregabalin     headaches Other reaction(s): Confusion (intolerance)    History of Present Illness    Mr. Altizer is  here for ongoing assessment and management of coronary artery disease, hyperlipidemia, history of tobacco abuse, and tachycardia.  The patient is complaining of waking up with heart racing around 7 AM in the morning every morning.  He states that is running up to 102 bpm.  The patient states that this is anxiety producing for him.  He also states that it can happen sometimes during the day at rest after he has done some work in the yard or something exertional.  He does use a elliptical trainer bicycle at home and lifts weights, and does not have any rapid heart rhythm out of range for normal exercise.  Heart rate comes down normally at rest.  Home Medications    Current Outpatient Medications  Medication Sig Dispense Refill   acyclovir (ZOVIRAX) 800 MG tablet Take 1 tablet (800 mg total) by mouth 2 (two) times daily. 180 tablet 3   amLODipine (NORVASC) 10 MG tablet Take 1 tablet (10 mg total) by mouth every evening. 90 tablet 1   aspirin EC 81 MG tablet Take 1 tablet (81 mg total) by mouth daily. Swallow whole. 30 tablet 12   atorvastatin (LIPITOR) 80 MG tablet Take 1 tablet (80 mg total) by mouth daily. 90 tablet 2   baclofen (LIORESAL) 20 MG tablet Take 1 tablet (20 mg total) by mouth every 8 (eight) hours as needed for muscle spasms. (Patient not taking: Reported on 06/14/2023) 60 each 5   cetirizine (ZYRTEC) 10 MG tablet Take 10 mg by mouth daily.     diazepam (VALIUM) 10 MG tablet Take 10 mg by mouth 3 (three) times daily.     diclofenac sodium (VOLTAREN) 1 % GEL Apply 1 application  topically daily as needed (For pain).     EPINEPHrine 0.3 mg/0.3 mL IJ SOAJ injection USE AS DIRECTED 2 each 1   fentaNYL (DURAGESIC - DOSED MCG/HR) 50 MCG/HR Place 50 mcg onto the skin every other day.     fluticasone (FLONASE) 50 MCG/ACT nasal spray USE TWO SPRAYS IN EACH NOSTRIL DAILY 16 g 11   lactulose, encephalopathy, (CHRONULAC) 10 GM/15ML SOLN Take 20 g by mouth at bedtime.     nitroGLYCERIN (NITROSTAT)  0.4 MG SL tablet Place 1 tablet (0.4 mg total) under the tongue every 5 (five) minutes as needed for chest pain. 100 tablet 3   oxyCODONE (OXY IR/ROXICODONE) 5 MG immediate release tablet Take 5 mg by mouth every 6 (six) hours as needed for moderate pain.  0   promethazine (PHENERGAN) 25 MG tablet Take 12.5 mg by mouth every 6 (  six) hours as needed for nausea or vomiting.     scopolamine (TRANSDERM SCOP, 1.5 MG,) 1 MG/3DAYS Place 1 patch (1.5 mg total) onto the skin every 3 (three) days. (Patient not taking: Reported on 06/14/2023) 10 patch 0   ticagrelor (BRILINTA) 90 MG TABS tablet Take 1 tablet (90 mg total) by mouth 2 (two) times daily. 60 tablet 11   traZODone (DESYREL) 100 MG tablet Take 50 mg by mouth at bedtime.     triamcinolone cream (KENALOG) 0.1 % Apply 1 application topically 2 (two) times daily. 30 g 0   No current facility-administered medications for this visit.     Family History    Family History  Problem Relation Age of Onset   Heart disease Other    Diabetes Other    Hyperlipidemia Other    Hypertension Sister    Breast cancer Sister    Heart disease Father    Diabetes Father    Heart attack Father        x 2   Prostate cancer Neg Hx    Kidney disease Neg Hx    He indicated that his mother is alive. He indicated that his father is alive. He indicated that his sister is alive. He indicated that his maternal grandmother is deceased. He indicated that his maternal grandfather is deceased. He indicated that his paternal grandmother is deceased. He indicated that his paternal grandfather is deceased. He indicated that the status of his neg hx is unknown. He indicated that the status of his other is unknown.  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  Tobacco Use   Smoking status: Former   Smokeless tobacco: Never   Tobacco comments:    quit 35 years  Substance and Sexual Activity   Alcohol use: Yes    Alcohol/week: 0.0 standard drinks of alcohol    Comment: occasional   Drug use: No   Sexual activity: Yes    Birth control/protection: Condom  Other Topics Concern   Not on file  Social History Narrative   Lives with wife   Caffeine- coffee 1-2 cups, rarely a soda   Social Determinants of Health   Financial Resource Strain: Low Risk  (01/10/2023)   Overall Financial Resource Strain (CARDIA)    Difficulty of Paying Living Expenses: Not hard at all  Food Insecurity: No Food Insecurity (01/10/2023)   Hunger Vital Sign    Worried About Running Out of Food in the Last Year: Never true    Ran Out of Food in the Last Year: Never true  Transportation Needs: No Transportation Needs (01/10/2023)   PRAPARE - Administrator, Civil Service (Medical): No    Lack of Transportation (Non-Medical): No  Physical Activity: Insufficiently Active (01/10/2023)   Exercise Vital Sign    Days of Exercise per Week: 7 days    Minutes of Exercise per Session: 10 min  Stress: No Stress Concern Present (01/10/2023)   [employer] of Occupational Health - Occupational Stress Questionnaire    Feeling of Stress : Not at all  Recent Concern: Stress - Stress Concern Present (12/22/2022)   [employer] of Occupational Health - Occupational Stress  Questionnaire    Feeling of Stress : Rather much  Social Connections: Moderately Isolated (01/10/2023)   Social Connection and Isolation Panel [NHANES]    Frequency of Communication with Friends and Family: More than three times a week    Frequency of Social Gatherings with Friends and Family: More than three times a week    Attends Religious Services: Never    Database administrator or Organizations: No    Attends Tax inspector Meetings: Never    Marital Status: Married  Catering manager Violence: Not At Risk (01/10/2023)   Humiliation, Afraid, Rape, and Kick questionnaire    Fear of Current or Ex-Partner: No    Emotionally Abused: No    Physically Abused: No    Sexually Abused: No     Review of Systems    General:  No chills, fever, night sweats or weight changes.  Cardiovascular:  No chest pain, dyspnea on exertion, edema, orthopnea, palpitations, paroxysmal nocturnal dyspnea. Dermatological: No rash, lesions/masses Respiratory: No cough, dyspnea Urologic: No hematuria, dysuria Abdominal:   No nausea, vomiting, diarrhea, bright red blood per rectum, melena, or hematemesis Neurologic:  No visual changes, wkns, changes in mental status. All other systems reviewed and are otherwise negative except as noted above.       Physical Exam    VS:  There were no vitals taken for this visit. , BMI There is no height or weight on file to calculate BMI.     GEN: Well nourished, well developed, in no acute distress. HEENT: normal. Neck: Supple, no JVD, carotid bruits, or masses. Cardiac: RRR, no murmurs, rubs, or gallops. No clubbing, cyanosis, edema.  Radials/DP/PT 2+ and equal bilaterally.  Respiratory:  Respirations regular and unlabored, clear to auscultation bilaterally. GI: Soft, nontender, nondistended, BS + x 4. MS: no deformity or atrophy. Skin: warm and dry, no rash. Neuro:  Strength and sensation are intact. Psych: Normal affect.      Lab Results  Component Value Date   WBC 9.4 12/11/2022   HGB 15.9 12/11/2022   HCT 47.5 12/11/2022   MCV 90.5 12/11/2022   PLT 195 12/11/2022   Lab Results  Component Value Date   CREATININE 0.97 12/11/2022   BUN 10 12/11/2022   NA 133 (L) 12/11/2022   K 3.5 12/11/2022   CL 100 12/11/2022   CO2 23 12/11/2022   Lab Results  Component Value Date   ALT 65 (H) 11/09/2021   AST 44 (H) 11/09/2021   ALKPHOS 68 11/09/2021   BILITOT 0.6 11/09/2021    Lab Results  Component Value Date   CHOL 238 (H) 12/10/2022   HDL 61 12/10/2022   LDLCALC 163 (H) 12/10/2022   LDLDIRECT 179.9 12/18/2012   TRIG 71 12/10/2022   CHOLHDL 3.9 12/10/2022    Lab Results  Component Value Date   HGBA1C 5.9 (H) 12/11/2022     Review of Prior Studies  Zio Monitor 05/12/2023 The dominant rhythm is normal sinus rhythm with normal circadian variation.   There are rare premature atrial contractions and only 2 very brief episodes of nonsustained atrial tachycardia (4-5 beats long).  There is no evidence of atrial fibrillation.   There are rare premature ventricular contractions and there is no evidence of complex ventricular arrhythmia.   There are no episodes of severe bradycardia, pauses or high-grade atrioventricular block.   Several symptom triggered recordings are submitted for analysis.  These all showed normal sinus rhythm or mild sinus tachycardia.    LHC  12/10/2022 1.  Patent left main and LAD with mild nonobstructive plaquing but no significant stenosis 2.  Moderate eccentric nonobstructive mid RCA stenosis with diffuse plaquing throughout the large dominant RCA 3.  Severe subtotal 99% stenosis of the mid circumflex with a sequential lesion into the branching obtuse marginal, treated successfully with PCI using a 2.25 x 24 mm Synergy DES 4.  Mild segmental LV contraction abnormality with mild hypokinesis of the anterolateral wall and preserved overall LVEF of 55%  Assessment & Plan   1.  Coronary artery disease: Doing well from the standpoint.  Denies any significant chest pain, dyspnea on exertion, issues with bleeding on Brilinta, decreased energy.  He will continue on DAPT until March 2025 at which time we may consider having him just begin taking aspirin 81 mg only.  He will continue secondary management with purposeful exercise, low-cholesterol diet, weight management, and blood pressure control.  2.  Early morning tachycardia: Patient states he  awakens with his heart racing early in the morning.  He says he gets after about 102 bpm.  He has been intolerant of beta-blockers in the past.  I am going to start him on a very low-dose dose of bisoprolol which she can take at at bedtime, 5 mg, to evaluate whether this helps him with early morning catecholamine surge and tachycardia.  He is advised to let me know how he is feeling after taking this medication for about a a week to see if this has been helpful to him.  We may need to titrate up to 10 mg but will start him at the very low dose due to beta-blocker intolerance causing him to feel fatigued and short of breath.  3.  Hypercholesterolemia: Fasting lab work has been ordered he is yet to go by [facility] to have this completed.  Goal of LDL less than 70.  Continue atorvastatin 80 mg daily as directed.       Signed, Bettey Mare. Liborio Nixon, ANP, AACC   07/10/2023 2:39 PM      Office [phone] Fax [phone]  Notice: This dictation was prepared with Dragon dictation along with smaller phrase technology. Any transcriptional errors that result from this process are unintentional and may not be corrected upon review.  What I have is it does not wake me up as much now as soon as I wake up it hits family Thursday 21st he had this when he was to put him

## 2023-07-12 ENCOUNTER — Encounter: Payer: Self-pay | Admitting: Adult Health

## 2023-07-12 ENCOUNTER — Ambulatory Visit: Payer: PPO | Attending: Adult Health | Admitting: Adult Health

## 2023-07-12 VITALS — BP 114/80 | HR 95 | Ht 69.0 in | Wt 171.2 lb

## 2023-07-12 DIAGNOSIS — R002 Palpitations: Secondary | ICD-10-CM | POA: Diagnosis not present

## 2023-07-12 DIAGNOSIS — I4719 Other supraventricular tachycardia: Secondary | ICD-10-CM

## 2023-07-12 DIAGNOSIS — E7800 Pure hypercholesterolemia, unspecified: Secondary | ICD-10-CM

## 2023-07-12 MED ORDER — BISOPROLOL FUMARATE 5 MG PO TABS: 5.0000 mg | ORAL_TABLET | Freq: Every evening | ORAL | 1 refills | Status: DC

## 2023-07-12 MED ORDER — BISOPROLOL FUMARATE 5 MG PO TABS
5.0000 mg | ORAL_TABLET | Freq: Every day | ORAL | Status: DC
Start: 2023-07-12 — End: 2023-07-12

## 2023-07-12 MED ORDER — NITROGLYCERIN 0.4 MG SL SUBL: 0.4000 mg | SUBLINGUAL_TABLET | SUBLINGUAL | 3 refills | Status: AC | PRN

## 2023-07-12 NOTE — Patient Instructions (Signed)
Medication Instructions:  Start Bisoprolol 5 mg ( Take 1 Tablet At Bedtime). *If you need a refill on your cardiac medications before your next appointment, please call your pharmacy*   Lab Work: NoLabs If you have labs (blood work) drawn today and your tests are completely normal, you will receive your results only by: MyChart Message (if you have MyChart) OR A paper copy in the mail If you have any lab test that is abnormal or we need to change your treatment, we will call you to review the results.   Testing/Procedures: No Testing   Follow-Up: At [facility], you and your health needs are our priority.  As part of our continuing mission to provide you with exceptional heart care, we have created designated Provider Care Teams.  These Care Teams include your primary Cardiologist (physician) and Advanced Practice Providers (APPs -  Physician Assistants and Nurse Practitioners) who all work together to provide you with the care you need, when you need it.  We recommend signing up for the patient portal called "MyChart".  Sign up information is provided on this After Visit Summary.  MyChart is used to connect with patients for Virtual Visits (Telemedicine).  Patients are able to view lab/test results, encounter notes, upcoming appointments, etc.  Non-urgent messages can be sent to your provider as well.   To learn more about what you can do with MyChart, go to [email or web address].    Your next appointment:   6 month(s)  Provider:   Bryan Lemma, MD

## 2023-07-13 ENCOUNTER — Ambulatory Visit (INDEPENDENT_AMBULATORY_CARE_PROVIDER_SITE_OTHER): Payer: PPO | Admitting: Clinical

## 2023-07-13 DIAGNOSIS — F419 Anxiety disorder, unspecified: Secondary | ICD-10-CM

## 2023-07-13 NOTE — Progress Notes (Signed)
[facility] Counselor/Therapist Progress Note  Patient ID: [name], MRN: [id number],    Date: 07/13/2023  Time Spent: 1:34pm - 2:22pm : 48 minutes   Treatment Type: Individual Therapy  Reported Symptoms: Patient reported a decline in mood and reported he wakes up with his heart racing  Mental Status Exam: Appearance:  Neat and Well Groomed     Behavior: Appropriate  Motor: Normal  Speech/Language:  Clear and Coherent  Affect: Appropriate  Mood: frustrated  Thought process: normal  Thought content:   WNL  Sensory/Perceptual disturbances:   WNL  Orientation: oriented to person, place, and situation  Attention: Good  Concentration: Good  Memory: WNL  Fund of knowledge:  Good  Insight:   Good  Judgment:  Good  Impulse Control: Good   Risk Assessment: Danger to Self:  No Patient denied current suicidal ideation  Self-injurious Behavior: No Danger to Others: No Patient denied current homicidal ideation Duty to Warn:no Physical Aggression / Violence:No  Access to Firearms a concern: No  Gang Involvement:No   Subjective: Patient stated, "not good" in response to events since last session. Patient reported appointments with his pain management provider and cardiologist since last session. Patient reported he wakes up feeling his heart is racing and reported his cardiologist recently changed patient's medication as a result. Patient reported his cardiologist and pain management provider recommended patient discontinue patient's participation on the homeowner's association board. Patient reported he plans to discontinue his participation on the board after the annual meeting in November 2024. Patient stated, "not good" in response to patient's mood since last session. Patient reported pain and cardiac symptoms have contributed to patient's mood. Patient reported he recently completed a master class on happiness through the app, calm. Patient stated, "I would  love to go back to work", enjoys teaching others, would like to pay off his home, and enjoys [name]. Patient stated, "I love helping people". Patient stated, "just being able to spend time with my friends and family, right now that's limited" in response to patient's perception of happiness.   Interventions: Cognitive Behavioral Therapy and Motivational Interviewing. Clinician conducted session via caregility video from clinician's home office. Patient provided verbal consent to proceed with telehealth session and is aware of limitations of telephone or video visits. Patient participated in session from patient's home. Reviewed events since last session. Assessed patient's mood since last session and assessed current mood. Explored and identified triggers for recent decline in mood. Clinician utilized motivational interviewing to explore additional goals for therapy. Clinician utilized a task centered approach in collaboration with patient to finalize goals for therapy. Patient participated in development of the following goals and agreed to the following goals for therapy. Clinician requested patient complete thought record for homework.    Collaboration of Care: Other not required at this time   Diagnosis:  Anxiety disorder, unspecified type     Plan: Patient is to utilize [employer] Therapy, thought re-framing, relaxation techniques, opposite action, mindfulness and coping strategies to decrease symptoms associated with their diagnosis. Frequency: bi-weekly  Modality: individual      Long-term goal:   Reduce overall level, frequency, and intensity of the feelings of anxiety as evidenced by decreased increased heart rate, shortness of breath, decreased energy, loss of interest from 7 days/week to 0 to 1 days/week per patient report for at least 3 consecutive months. Target Date: 06/26/24  Progress: progressing    Short-term goal:  Increase patient's awareness of patient's verbal and  nonverbal  communication with others and how patient's communication is perceived by others Target Date: 12/25/23  Progress: progressing    Develop and implement healthy communication strategies for patient to utilize when expressing his thoughts and feelings to others   Target Date: 12/25/23  Progress: progressing    Develop and implement coping strategies for patient to utilize to decrease the negative impact chronic pain has on patient's ability to participate in family activities Target Date: 12/25/23  Progress: progressing    Maintain physical activity 3-5 days per week  Target Date: 12/25/23  Progress: progressing    Increase patient's participation in activities patient enjoys, such as, Ronald Chi, teaching opportunities, volunteer opportunities,  Target Date: 12/25/23  Progress: progressing                     Doree Barthel, LCSW

## 2023-07-13 NOTE — Progress Notes (Signed)
                Dezi Schaner, LCSW 

## 2023-07-15 NOTE — Telephone Encounter (Signed)
Spoke to patient he stated he cannot sleep taking Bisoprolol 5 mg.He took 2.5 mg at 6:00 pm yesterday and still could not sleep.Advised I will send message to Joni Reining DNP for advice.

## 2023-07-22 ENCOUNTER — Telehealth: Payer: Self-pay | Admitting: *Deleted

## 2023-07-22 MED ORDER — TICAGRELOR 90 MG PO TABS: 90.0000 mg | ORAL_TABLET | Freq: Two times a day (BID) | ORAL | 3 refills | Status: DC

## 2023-07-22 NOTE — Telephone Encounter (Signed)
Received fax from [facility] Needing a current prescription for Brilinta 90 mg  on file for the patient to continue to process patient order.    Prescription e- sent.

## 2023-07-26 ENCOUNTER — Ambulatory Visit (INDEPENDENT_AMBULATORY_CARE_PROVIDER_SITE_OTHER): Payer: PPO | Admitting: Clinical

## 2023-07-26 DIAGNOSIS — F419 Anxiety disorder, unspecified: Secondary | ICD-10-CM | POA: Diagnosis not present

## 2023-07-26 NOTE — Progress Notes (Unsigned)
                Dezi Schaner, LCSW 

## 2023-07-26 NOTE — Progress Notes (Unsigned)
[facility] Counselor/Therapist Progress Note  Patient ID: [name], MRN: [id number],    Date: 07/26/2023  Time Spent: 1:35pm - 2:27pm : 52 minutes   Treatment Type: Individual Therapy  Reported Symptoms: Patinet reported recenlty expeirencign insomnia and increased anxiety.   Mental Status Exam: Appearance:  Neat and Well Groomed     Behavior: Appropriate  Motor: Normal  Speech/Language:  Clear and Coherent  Affect: Appropriate  Mood: anxious  Thought process: normal  Thought content:   WNL  Sensory/Perceptual disturbances:   In pain during session  Orientation: oriented to person, place, and situation  Attention: Good  Concentration: Good  Memory: WNL  Fund of knowledge:  Good  Insight:   Good  Judgment:  Good  Impulse Control: Good   Risk Assessment: Danger to Self:  No Patient denied current suicidal ideation  Self-injurious Behavior: No Danger to Others: No Patient denied current homicidal ideation Duty to Warn:no Physical Aggression / Violence:No  Access to Firearms a concern: No  Gang Involvement:No   Subjective: Patietn stated, "things have progressively gotten worse" since last session. Patient reported he was prescribed a new medication and reported side effects are anxieyt and insomnia. Patient reported he started experiencing insomnia to the new medication and asked a pharmacy about the side effects. Patient stated, "everything increased" after patient started new medication. Patient reported he recenlty pulled a muscle and stated, "I have been miserable ever since". Patient reproted he notifed his cardiologist of side effects from new medication and plans to discuss with cardiologist further. Patient stated, "probably about the same" in response to mood. Patient reported his psychiatrist is out of network and patient stated, "I can't afford the co-pay". Patient inquired about optoins for a local psychiatrist. Patient reported he spoke  with his psychiatrist about changing psychiatrist due to financial barriers. Patient stated, "I dont like being inactive". Patient reported he noticed frequent frustration on his thought record. Waking up with heart racing - and patient reported experiencing the thought of "Im tired of it" in response. Patient reported he noted recent HOA meeting on his thought record and reported feeling frustrated, angry, and "real bad anxiety" in response.  Difficulty letting go of thoughts associated with HOA board. Patietn reported he expeirences constant pain and is limited in patient's activities.    Interventions: Cognitive Behavioral Therapy. Clinician conducted session via caregility video from clinician's office at [facility]. Patient provided verbal consent to proceed with telehealth session and is aware of limitations of telephone or video visits. Patient participated in session from patient's home. Reviewed patient's thougth record entries and assisted patient in exploring patient's thoughts/feelings in response to entries in patient's thought record. Discussed patient reaching out to his PCP to discuss patient's concern related to potentional pulled muscle. Discussed patient's inquiry about local psychiatrists and barriers to patient continuing treatment with current psyciatrist. Clinician requested patient continue thought record for homework and identify activities patient can perform when expeirncing pain.   Collaboration of Care: Other not required at this time   Diagnosis:  Anxiety disorder, unspecified type     Plan: Patient is to utilize [employer] Therapy, thought re-framing, relaxation techniques, opposite action, mindfulness and coping strategies to decrease symptoms associated with their diagnosis. Frequency: bi-weekly  Modality: individual      Long-term goal:   Reduce overall level, frequency, and intensity of the feelings of anxiety as evidenced by decreased increased  heart rate, shortness of breath, decreased energy, loss of interest from 7  days/week to 0 to 1 days/week per patient report for at least 3 consecutive months. Target Date: 06/26/24  Progress: progressing    Short-term goal:  Increase patient's awareness of patient's verbal and nonverbal communication with others and how patient's communication is perceived by others Target Date: 12/25/23  Progress: progressing    Develop and implement healthy communication strategies for patient to utilize when expressing his thoughts and feelings to others   Target Date: 12/25/23  Progress: progressing    Develop and implement coping strategies for patient to utilize to decrease the negative impact chronic pain has on patient's ability to participate in family activities Target Date: 12/25/23  Progress: progressing    Maintain physical activity 3-5 days per week  Target Date: 12/25/23  Progress: progressing    Increase patient's participation in activities patient enjoys, such as, Tai Chi, teaching opportunities, volunteer opportunities,  Target Date: 12/25/23  Progress: progressing                 Doree Barthel, LCSW

## 2023-08-01 ENCOUNTER — Telehealth (INDEPENDENT_AMBULATORY_CARE_PROVIDER_SITE_OTHER): Payer: PPO | Admitting: Family Medicine

## 2023-08-01 ENCOUNTER — Telehealth: Payer: Self-pay | Admitting: Cardiology

## 2023-08-01 ENCOUNTER — Encounter: Payer: Self-pay | Admitting: Family Medicine

## 2023-08-01 DIAGNOSIS — S76211A Strain of adductor muscle, fascia and tendon of right thigh, initial encounter: Secondary | ICD-10-CM | POA: Diagnosis not present

## 2023-08-01 NOTE — Progress Notes (Signed)
Subjective:    Patient ID: [name], male    DOB: 1960/10/27, 62 y.o.   MRN: [id number]  HPI Virtual Visit via Video Note  I connected with the patient on 08/01/23 at  2:30 PM EST by a video enabled telemedicine application and verified that I am speaking with the correct person using two identifiers.  Location patient: home Location provider:work or home office Persons participating in the virtual visit: patient, provider  I discussed the limitations of evaluation and management by telemedicine and the availability of in person appointments. The patient expressed understanding and agreed to proceed.   HPI: Here for what he thinks is a muscle strain in the right groin. This began suddenly one week ago several hours after he was doing some tai chi exercises. He did one where he stood on one leg and moved the other leg up in the air, and this was very uncomfortable. Since then the pain has very slowly improved. He cannot feel any lumps in the area. He has taken Tylenol and applied ice.    ROS: See pertinent positives and negatives per HPI.  Past Medical History:  Diagnosis Date   ADHD (attention deficit hyperactivity disorder), inattentive type    sees Dr. Deboraha Sprang in [place], [place]    Anxiety    sees Dr. Deboraha Sprang in [place], [place]    Arthritis    Benign enlargement of prostate    sees [facility]    CAD- 1 Vessel, DES PCI LCx. (NSTEMI) 12/10/2022   LHC 12/10/2022:  Minimal LM & LAD disease. Mid Cx 99% (subtotal occlusion) =>  PCI using a 2.25 x 24 mm Synergy DES; pRCA 30% & mRCA 50%.      Carpal tunnel syndrome on left    sees Dr. Kathie Dike at [facility]    Chronic pain    sees Dr. Thyra Breed (meds) and Altamease Oiler (accupuncture)    Depression    sees Dr. Deboraha Sprang   Functional impotence    Hyperlipidemia with target LDL less than 70 07/09/2014   Hypogonadism in male    sees Carollee Herter (a [place]) at [facility]    Insomnia    Kidney stones     NSTEMI (non-ST elevated myocardial infarction) (HCC) 12/10/2022   Admitted with stuttering chest pain, troponin elevation. => 9 9% LCx=> DES stent.  Normal EF on echo.   Sinusitis, acute 07/17/2016   Urticaria due to cold     Past Surgical History:  Procedure Laterality Date   APPENDECTOMY     CERVICAL FUSION  2008   C6-7 per Dr. Sharolyn Douglas   COLONOSCOPY  11/06/2009   per Dr. Dorena Cookey, no polyps, repeat in 10 yrs    CORONARY STENT INTERVENTION N/A 12/10/2022   Procedure: CORONARY STENT INTERVENTION;  Surgeon: Tonny Bollman, MD;  Location: [facility] INVASIVE CV LAB;  Service: Cardiovascular; Mid LCx 99 %=> 0%: 2.25 x 24 Synergy DES.   FOOT SURGERY Right    x 2   LEFT HEART CATH AND CORONARY ANGIOGRAPHY N/A 12/10/2022   Procedure: LEFT HEART CATH AND CORONARY ANGIOGRAPHY;  Surgeon: Tonny Bollman, MD;  Location: [facility] INVASIVE CV LAB;  Service: CV:: NSTEMI: Minimal LM & LAD disease. Mid Cx 99% (subtotal occlusion) => DES PCI; pRCA 30% & mRCA 50%.   LUMBAR DISC SURGERY     L5-S1, C 6-6   TRANSTHORACIC ECHOCARDIOGRAM  12/11/2022   ( non-STEMI) normal LV size and function.  EF 60 to 65%.  No RWMA.  GR 1 DD.  Normal RV size and function.  Normal PAP and RAP.  Normal valves.--Normal echo.    Family History  Problem Relation Age of Onset   Heart disease Other    Diabetes Other    Hyperlipidemia Other    Hypertension Sister    Breast cancer Sister    Heart disease Father    Diabetes Father    Heart attack Father        x 2   Prostate cancer Neg Hx    Kidney disease Neg Hx      Current Outpatient Medications:    acyclovir (ZOVIRAX) 800 MG tablet, Take 1 tablet (800 mg total) by mouth 2 (two) times daily., Disp: 180 tablet, Rfl: 3   amLODipine (NORVASC) 10 MG tablet, Take 1 tablet (10 mg total) by mouth every evening., Disp: 90 tablet, Rfl: 1   aspirin EC 81 MG tablet, Take 1 tablet (81 mg total) by mouth daily. Swallow whole., Disp: 30 tablet, Rfl: 12   atorvastatin (LIPITOR) 80 MG  tablet, Take 1 tablet (80 mg total) by mouth daily., Disp: 90 tablet, Rfl: 2   baclofen (LIORESAL) 20 MG tablet, Take 1 tablet (20 mg total) by mouth every 8 (eight) hours as needed for muscle spasms., Disp: 60 each, Rfl: 5   bisoprolol (ZEBETA) 5 MG tablet, Take 1 tablet (5 mg total) by mouth at bedtime., Disp: 90 tablet, Rfl: 1   cetirizine (ZYRTEC) 10 MG tablet, Take 10 mg by mouth daily., Disp: , Rfl:    diazepam (VALIUM) 10 MG tablet, Take 10 mg by mouth 3 (three) times daily., Disp: , Rfl:    diclofenac sodium (VOLTAREN) 1 % GEL, Apply 1 application  topically daily as needed (For pain)., Disp: , Rfl:    EPINEPHrine 0.3 mg/0.3 mL IJ SOAJ injection, USE AS DIRECTED, Disp: 2 each, Rfl: 1   fentaNYL (DURAGESIC - DOSED MCG/HR) 50 MCG/HR, Place 50 mcg onto the skin every other day., Disp: , Rfl:    fluticasone (FLONASE) 50 MCG/ACT nasal spray, USE TWO SPRAYS IN EACH NOSTRIL DAILY, Disp: 16 g, Rfl: 11   lactulose, encephalopathy, (CHRONULAC) 10 GM/15ML SOLN, Take 20 g by mouth at bedtime., Disp: , Rfl:    METHOCARBAMOL PO, Take by mouth as needed., Disp: , Rfl:    nitroGLYCERIN (NITROSTAT) 0.4 MG SL tablet, Place 1 tablet (0.4 mg total) under the tongue every 5 (five) minutes as needed for chest pain., Disp: 25 tablet, Rfl: 3   oxyCODONE (OXY IR/ROXICODONE) 5 MG immediate release tablet, Take 5 mg by mouth every 6 (six) hours as needed for moderate pain., Disp: , Rfl: 0   promethazine (PHENERGAN) 25 MG tablet, Take 12.5 mg by mouth every 6 (six) hours as needed for nausea or vomiting., Disp: , Rfl:    scopolamine (TRANSDERM SCOP, 1.5 MG,) 1 MG/3DAYS, Place 1 patch (1.5 mg total) onto the skin every 3 (three) days., Disp: 10 patch, Rfl: 0   ticagrelor (BRILINTA) 90 MG TABS tablet, Take 1 tablet (90 mg total) by mouth 2 (two) times daily., Disp: 180 tablet, Rfl: 3   traZODone (DESYREL) 100 MG tablet, Take 50 mg by mouth at bedtime., Disp: , Rfl:    triamcinolone cream (KENALOG) 0.1 %, Apply 1  application topically 2 (two) times daily., Disp: 30 g, Rfl: 0  EXAM:  VITALS per patient if applicable:  GENERAL: alert, oriented, appears well and in no acute distress  HEENT: atraumatic, conjunttiva clear, no obvious abnormalities on inspection  of external nose and ears  NECK: normal movements of the head and neck  LUNGS: on inspection no signs of respiratory distress, breathing rate appears normal, no obvious gross SOB, gasping or wheezing  CV: no obvious cyanosis  MS: moves all visible extremities without noticeable abnormality  PSYCH/NEURO: pleasant and cooperative, no obvious depression or anxiety, speech and thought processing grossly intact  ASSESSMENT AND PLAN: Right groin strain. I advised him that these injuries can take weeks to months to heal, so he should be patient with it. Come in for an exam if he feels no better in 2 weeks.  Gershon Crane, MD  Discussed the following assessment and plan:  No diagnosis found.     I discussed the assessment and treatment plan with the patient. The patient was provided an opportunity to ask questions and all were answered. The patient agreed with the plan and demonstrated an understanding of the instructions.   The patient was advised to call back or seek an in-person evaluation if the symptoms worsen or if the condition fails to improve as anticipated.      Review of Systems     Objective:   Physical Exam        Assessment & Plan:

## 2023-08-01 NOTE — Telephone Encounter (Signed)
Pt c/o medication issue:  1. Name of Medication:  ticagrelor (BRILINTA) 90 MG TABS tablet  2. How are you currently taking this medication (dosage and times per day)?   3. Are you having a reaction (difficulty breathing--STAT)?   4. What is your medication issue?   Patient would like to know if he will need to stop taking Brilinta in March, 2025.

## 2023-08-02 DIAGNOSIS — F322 Major depressive disorder, single episode, severe without psychotic features: Secondary | ICD-10-CM | POA: Diagnosis not present

## 2023-08-02 NOTE — Telephone Encounter (Signed)
Left voicemail to return call to office.

## 2023-08-09 ENCOUNTER — Ambulatory Visit: Payer: PPO | Admitting: Clinical

## 2023-08-15 ENCOUNTER — Other Ambulatory Visit: Payer: Self-pay | Admitting: Family Medicine

## 2023-08-22 ENCOUNTER — Ambulatory Visit (INDEPENDENT_AMBULATORY_CARE_PROVIDER_SITE_OTHER): Payer: PPO | Admitting: Clinical

## 2023-08-22 ENCOUNTER — Telehealth: Payer: Self-pay | Admitting: Cardiology

## 2023-08-22 DIAGNOSIS — F419 Anxiety disorder, unspecified: Secondary | ICD-10-CM | POA: Diagnosis not present

## 2023-08-22 NOTE — Progress Notes (Unsigned)
                Dezi Schaner, LCSW 

## 2023-08-22 NOTE — Telephone Encounter (Signed)
*  STAT* If patient is at the pharmacy, call can be transferred to refill team.   1. Which medications need to be refilled? (please list name of each medication and dose if known)   atorvastatin (LIPITOR) 80 MG tablet   2. Would you like to learn more about the convenience, safety, & potential cost savings by using the [facility]?   3. Are you open to using the [facility] (Type [facility]   4. Which pharmacy/location (including street and city if local pharmacy) is medication to be sent to?  [id number] - [place], [place] - [address]   5. Do they need a 30 day or 90 day supply?   90 day  Patient stated he is almost out of this medication.   Patient has appointment on 3/17.

## 2023-08-22 NOTE — Progress Notes (Unsigned)
[facility] Counselor/Therapist Progress Note  Patient ID: [name], MRN: [id number],    Date: 08/22/2023  Time Spent: 1:34pm - 2:29pm : 55 minutes  Treatment Type: Individual Therapy  Reported Symptoms: frustration, anxiety, wakes up with increased heart rate  Mental Status Exam: Appearance:  Neat and Well Groomed     Behavior: Appropriate  Motor: Normal  Speech/Language:  Clear and Coherent  Affect: Appropriate  Mood: anxious  Thought process: normal  Thought content:   WNL  Sensory/Perceptual disturbances:   WNL  Orientation: oriented to person, place, and situation  Attention: Good  Concentration: Good  Memory: WNL  Fund of knowledge:  Good  Insight:   Good  Judgment:  Good  Impulse Control: Good   Risk Assessment: Danger to Self:  No Patient denied current suicidal ideation  Self-injurious Behavior: No Danger to Others: No Patient denied current homicidal ideation Duty to Warn:no Physical Aggression / Violence:No  Access to Firearms a concern: No  Gang Involvement:No   Subjective: Patient stated, "not good" in response to events since last session. Patient stated, "I don't see things getting better", "everything gets very frustrating to me". "I've always been told I have a tone", "I have no idea I'm doing it (tone)". Patient stated, "the anxiety makes me hurt more", "I get frustrated a lot because I can't do something". Patient stated, "today its just been one of them days", "its an anxious day". Patient stated,  "I wake up with my heart beating out of my chest". Patient stated, "I don't leave the house that much, its pretty much by choice".  Patient stated, "I don't know from one day to the next day how I'm going to be". Patient reported feeling tired, frustrated, and in pain while standing to cook. Patient reported he is frustrated with the HOA, worried about their dog, worried about his wife's health. Patient reported a recent disagreement  with wife and stated, "it doesn't seem like a balance of duties".  Patient reported feeling frustrated that one of his medications makes patient feel fatigued and reported he becomes frustrated when he has to wait for the medication to take effect. Patient reported he uses meditation while waiting for medication to take effect.   Interventions: Cognitive Behavioral Therapy. Clinician conducted session via caregility video from clinician's home office. Patient provided verbal consent to proceed with telehealth session and is aware of limitations of telephone or video visits. Patient participated in session from patient's home. Reviewed events since last session. Assisted patient in exploring and identifying patterns in changes in mood. Assessed patient's current mood. Reviewed patient's thought record and assisted patient in examining and identifying thoughts/feelings in response to recent triggers. Explored strategies to assist patient in participating in activities when in pain, such as, preparing food sitting at the table, using a crockpot. Discussed strategies patient utilizes in response to feelings of frustration. Provided reflective listening and validation. Clinician requested patient continue thought record for homework.     Collaboration of Care: Other not required at this time   Diagnosis:  Anxiety disorder, unspecified type     Plan: Patient is to utilize [employer], thought re-framing, relaxation techniques, opposite action, mindfulness and coping strategies to decrease symptoms associated with their diagnosis. Frequency: bi-weekly  Modality: individual      Long-term goal:   Reduce overall level, frequency, and intensity of the feelings of anxiety as evidenced by decreased increased heart rate, shortness of breath, decreased energy, loss of interest from 7 days/week  to 0 to 1 days/week per patient report for at least 3 consecutive months. Target Date: 06/26/24   Progress: progressing    Short-term goal:  Increase patient's awareness of patient's verbal and nonverbal communication with others and how patient's communication is perceived by others Target Date: 12/25/23  Progress: progressing    Develop and implement healthy communication strategies for patient to utilize when expressing his thoughts and feelings to others   Target Date: 12/25/23  Progress: progressing    Develop and implement coping strategies for patient to utilize to decrease the negative impact chronic pain has on patient's ability to participate in family activities Target Date: 12/25/23  Progress: progressing    Maintain physical activity 3-5 days per week  Target Date: 12/25/23  Progress: progressing    Increase patient's participation in activities patient enjoys, such as, Tai Chi, teaching opportunities, volunteer opportunities,  Target Date: 12/25/23  Progress: progressing             Doree Barthel, LCSW

## 2023-08-23 ENCOUNTER — Other Ambulatory Visit: Payer: Self-pay

## 2023-08-23 MED ORDER — ATORVASTATIN CALCIUM 80 MG PO TABS: 80.0000 mg | ORAL_TABLET | Freq: Every day | ORAL | 3 refills | Status: DC

## 2023-08-23 NOTE — Telephone Encounter (Signed)
 Sent RX to requested Pharmacy

## 2023-08-31 DIAGNOSIS — G5752 Tarsal tunnel syndrome, left lower limb: Secondary | ICD-10-CM | POA: Diagnosis not present

## 2023-08-31 DIAGNOSIS — Z79891 Long term (current) use of opiate analgesic: Secondary | ICD-10-CM | POA: Diagnosis not present

## 2023-08-31 DIAGNOSIS — G894 Chronic pain syndrome: Secondary | ICD-10-CM | POA: Diagnosis not present

## 2023-08-31 DIAGNOSIS — M961 Postlaminectomy syndrome, not elsewhere classified: Secondary | ICD-10-CM | POA: Diagnosis not present

## 2023-09-01 ENCOUNTER — Ambulatory Visit: Payer: PPO | Admitting: Family Medicine

## 2023-09-01 VITALS — BP 118/80 | HR 83 | Temp 98.2°F | Ht 68.0 in | Wt 169.0 lb

## 2023-09-01 DIAGNOSIS — Z125 Encounter for screening for malignant neoplasm of prostate: Secondary | ICD-10-CM | POA: Diagnosis not present

## 2023-09-01 DIAGNOSIS — I25119 Atherosclerotic heart disease of native coronary artery with unspecified angina pectoris: Secondary | ICD-10-CM

## 2023-09-01 DIAGNOSIS — I10 Essential (primary) hypertension: Secondary | ICD-10-CM | POA: Diagnosis not present

## 2023-09-01 DIAGNOSIS — R739 Hyperglycemia, unspecified: Secondary | ICD-10-CM | POA: Diagnosis not present

## 2023-09-01 DIAGNOSIS — N401 Enlarged prostate with lower urinary tract symptoms: Secondary | ICD-10-CM

## 2023-09-01 DIAGNOSIS — N138 Other obstructive and reflux uropathy: Secondary | ICD-10-CM | POA: Diagnosis not present

## 2023-09-01 DIAGNOSIS — E785 Hyperlipidemia, unspecified: Secondary | ICD-10-CM

## 2023-09-01 DIAGNOSIS — E291 Testicular hypofunction: Secondary | ICD-10-CM

## 2023-09-01 DIAGNOSIS — F32A Depression, unspecified: Secondary | ICD-10-CM

## 2023-09-01 DIAGNOSIS — Z23 Encounter for immunization: Secondary | ICD-10-CM | POA: Diagnosis not present

## 2023-09-01 DIAGNOSIS — R000 Tachycardia, unspecified: Secondary | ICD-10-CM | POA: Diagnosis not present

## 2023-09-01 DIAGNOSIS — Z0000 Encounter for general adult medical examination without abnormal findings: Secondary | ICD-10-CM | POA: Diagnosis not present

## 2023-09-01 LAB — LIPID PANEL
Cholesterol: 148 mg/dL (ref 0–200)
HDL: 54.2 mg/dL (ref >39.00)
LDL Cholesterol: 79 mg/dL (ref 0–99)
NonHDL: 93.59
Total CHOL/HDL Ratio: 3
Triglycerides: 72 mg/dL (ref 0.0–149.0)
VLDL: 14.4 mg/dL (ref 0.0–40.0)

## 2023-09-01 LAB — BASIC METABOLIC PANEL
BUN: 15 mg/dL (ref 6–23)
CO2: 25 meq/L (ref 19–32)
Calcium: 9.2 mg/dL (ref 8.4–10.5)
Chloride: 102 meq/L (ref 96–112)
Creatinine, Ser: 0.97 mg/dL (ref 0.40–1.50)
GFR: 84.38 mL/min (ref >60.00)
Glucose, Bld: 111 mg/dL — ABNORMAL HIGH (ref 70–99)
Potassium: 4 meq/L (ref 3.5–5.1)
Sodium: 136 meq/L (ref 135–145)

## 2023-09-01 LAB — CBC WITH DIFFERENTIAL/PLATELET
Basophils Absolute: 0 10*3/uL (ref 0.0–0.1)
Basophils Relative: 0.3 % (ref 0.0–3.0)
Eosinophils Absolute: 0.1 10*3/uL (ref 0.0–0.7)
Eosinophils Relative: 0.7 % (ref 0.0–5.0)
HCT: 48.5 % (ref 39.0–52.0)
Hemoglobin: 16.2 g/dL (ref 13.0–17.0)
Lymphocytes Relative: 13.2 % (ref 12.0–46.0)
Lymphs Abs: 1.1 10*3/uL (ref 0.7–4.0)
MCHC: 33.4 g/dL (ref 30.0–36.0)
MCV: 93.6 fL (ref 78.0–100.0)
Monocytes Absolute: 0.6 10*3/uL (ref 0.1–1.0)
Monocytes Relative: 6.8 % (ref 3.0–12.0)
Neutro Abs: 6.5 10*3/uL (ref 1.4–7.7)
Neutrophils Relative %: 79 % — ABNORMAL HIGH (ref 43.0–77.0)
Platelets: 213 10*3/uL (ref 150.0–400.0)
RBC: 5.19 Mil/uL (ref 4.22–5.81)
RDW: 13.5 % (ref 11.5–15.5)
WBC: 8.3 10*3/uL (ref 4.0–10.5)

## 2023-09-01 LAB — HEMOGLOBIN A1C: Hgb A1c MFr Bld: 5.9 % (ref 4.6–6.5)

## 2023-09-01 LAB — TSH: TSH: 1.17 u[IU]/mL (ref 0.35–5.50)

## 2023-09-01 LAB — HEPATIC FUNCTION PANEL
ALT: 40 U/L (ref 0–53)
AST: 32 U/L (ref 0–37)
Albumin: 4.7 g/dL (ref 3.5–5.2)
Alkaline Phosphatase: 72 U/L (ref 39–117)
Bilirubin, Direct: 0.2 mg/dL (ref 0.0–0.3)
Total Bilirubin: 0.8 mg/dL (ref 0.2–1.2)
Total Protein: 7.5 g/dL (ref 6.0–8.3)

## 2023-09-01 LAB — PSA: PSA: 2.92 ng/mL (ref 0.10–4.00)

## 2023-09-01 MED ORDER — FLUTICASONE PROPIONATE 50 MCG/ACT NA SUSP: 2.0000 | Freq: Every day | NASAL | 11 refills | Status: DC

## 2023-09-01 MED ORDER — PROPRANOLOL HCL 10 MG PO TABS: 10.0000 mg | ORAL_TABLET | Freq: Every day | ORAL | 0 refills | Status: DC

## 2023-09-01 MED ORDER — ACYCLOVIR 800 MG PO TABS: 800.0000 mg | ORAL_TABLET | Freq: Two times a day (BID) | ORAL | 3 refills | Status: DC

## 2023-09-01 MED ORDER — EPINEPHRINE 0.3 MG/0.3ML IJ SOAJ: INTRAMUSCULAR | 5 refills | Status: DC

## 2023-09-01 NOTE — Progress Notes (Addendum)
Subjective:    Patient ID: [name], male    DOB: 1960/12/18, 62 y.o.   MRN: [id number]  HPI Here for a well exam. He has been doing about the same as usual except for one main concern. He says ever since 12-10-22 when he had a coronary stent placed and he wa started on Brilinta, he wakes up every morning with his heart racing and pounding. He feels very anxious and shaky during these spells, but he denies chest pain or SOB. This lasts for an hour or two and then goes away. This has been disrupting his sleep, and he now feels tired all the time. He thought this may have been due to the Brilinta, but the spells keep coming whether he takes the Brilinta in the morning or at night. He has chronic anxiety, but he does not think these spells are due to anxiety alone. He saw the Cardiology office on 07-12-23, and after he discussed these spells with them he was given Bisoprolol 5 mg to take at bedtime. This was not helping so he has stopped it. He has been taking Valium 10 mg and Trazodone 100 mg at bedtime for years.    Review of Systems  Constitutional:  Positive for fatigue.  HENT: Negative.    Eyes: Negative.   Respiratory: Negative.    Cardiovascular:  Positive for palpitations.  Gastrointestinal: Negative.   Genitourinary: Negative.   Musculoskeletal:  Positive for back pain.  Skin: Negative.   Neurological: Negative.   Psychiatric/Behavioral: Negative.         Objective:   Physical Exam Constitutional:      General: He is not in acute distress.    Appearance: Normal appearance. He is well-developed. He is not diaphoretic.  HENT:     Head: Normocephalic and atraumatic.     Right Ear: External ear normal.     Left Ear: External ear normal.     Nose: Nose normal.     Mouth/Throat:     Pharynx: No oropharyngeal exudate.  Eyes:     General: No scleral icterus.       Right eye: No discharge.        Left eye: No discharge.     Conjunctiva/sclera: Conjunctivae normal.      Pupils: Pupils are equal, round, and reactive to light.  Neck:     Thyroid: No thyromegaly.     Vascular: No JVD.     Trachea: No tracheal deviation.  Cardiovascular:     Rate and Rhythm: Normal rate and regular rhythm.     Pulses: Normal pulses.     Heart sounds: Normal heart sounds. No murmur heard.    No friction rub. No gallop.  Pulmonary:     Effort: Pulmonary effort is normal. No respiratory distress.     Breath sounds: Normal breath sounds. No wheezing or rales.  Chest:     Chest wall: No tenderness.  Abdominal:     General: Bowel sounds are normal. There is no distension.     Palpations: Abdomen is soft. There is no mass.     Tenderness: There is no abdominal tenderness. There is no guarding or rebound.  Genitourinary:    Penis: Normal. No tenderness.      Testes: Normal.     Prostate: Normal.     Rectum: Normal. Guaiac result negative.  Musculoskeletal:        General: No tenderness. Normal range of motion.     Cervical back: Neck  supple.  Lymphadenopathy:     Cervical: No cervical adenopathy.  Skin:    General: Skin is warm and dry.     Coloration: Skin is not pale.     Findings: No erythema or rash.  Neurological:     General: No focal deficit present.     Mental Status: He is alert and oriented to person, place, and time.     Cranial Nerves: No cranial nerve deficit.     Motor: No abnormal muscle tone.     Coordination: Coordination normal.     Deep Tendon Reflexes: Reflexes are normal and symmetric. Reflexes normal.  Psychiatric:        Mood and Affect: Mood normal.        Behavior: Behavior normal.        Thought Content: Thought content normal.        Judgment: Judgment normal.           Assessment & Plan:  Well exam. We discussed diet and exercise. Get fasting labs as follows: a PSA (DX code N40.1,N13.8), an A1c  (R73.9), and lipids, liver, BMET, CBC (I10). For the early morning spells of tachycardia, he will try taking Propranolol 10 mg at  bedtime. Report back in 2 weeks.  Gershon Crane, MD

## 2023-09-02 ENCOUNTER — Encounter: Payer: Self-pay | Admitting: Cardiology

## 2023-09-05 ENCOUNTER — Ambulatory Visit: Payer: PPO | Admitting: Clinical

## 2023-09-05 DIAGNOSIS — F419 Anxiety disorder, unspecified: Secondary | ICD-10-CM

## 2023-09-05 NOTE — Progress Notes (Signed)
                Dezi Schaner, LCSW 
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
## 2023-09-16 ENCOUNTER — Ambulatory Visit: Payer: PPO | Admitting: Clinical

## 2023-09-16 DIAGNOSIS — F419 Anxiety disorder, unspecified: Secondary | ICD-10-CM | POA: Diagnosis not present

## 2023-09-16 NOTE — Progress Notes (Signed)
                Dezi Schaner, LCSW 
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
## 2023-09-23 ENCOUNTER — Other Ambulatory Visit: Payer: Self-pay | Admitting: Family Medicine

## 2023-09-27 ENCOUNTER — Telehealth: Payer: Self-pay | Admitting: Family Medicine

## 2023-09-27 NOTE — Telephone Encounter (Signed)
 Copied from CRM [id number]. Topic: General - Other >> Sep 27, 2023 10:52 AM Macario HERO wrote: Reason for CRM: Patient called and stated he see's Darice Seats who is a therapist and provided him with a list of different doctors he can see because most of his doctors are going out of network. He would like a response on MyChart or a phone call.

## 2023-10-03 ENCOUNTER — Ambulatory Visit (INDEPENDENT_AMBULATORY_CARE_PROVIDER_SITE_OTHER): Payer: PPO | Admitting: Clinical

## 2023-10-03 DIAGNOSIS — F419 Anxiety disorder, unspecified: Secondary | ICD-10-CM | POA: Diagnosis not present

## 2023-10-03 NOTE — Progress Notes (Signed)
 [facility] Counselor/Therapist Progress Note  Patient ID: [name], MRN: [id number],    Date: 10/03/2023  Time Spent: 1:34pm - 2:29pm : 55 minutes  Treatment Type: Individual Therapy  Reported Symptoms: Patient reported fatigue, shortness of breath, feeling anxious  Mental Status Exam: Appearance:  Neat and Well Groomed     Behavior: Appropriate  Motor: Normal  Speech/Language:  Clear and Coherent  Affect: Appropriate  Mood: normal  Thought process: tangential  Thought content:   Tangential  Sensory/Perceptual disturbances:   WNL  Orientation: oriented to person, place, and situation  Attention: Good  Concentration: Good  Memory: WNL  Fund of knowledge:  Good  Insight:   Good  Judgment:  Good  Impulse Control: Good   Risk Assessment: Danger to Self:  No Patient denied current suicidal ideation  Self-injurious Behavior: No Danger to Others: No Patient denied current homicidal ideation Duty to Warn:no Physical Aggression / Violence:No  Access to Firearms a concern: No  Gang Involvement:No   Subjective: Patient stated, just stressful, sick, the holidays are always hectic, I've got to force myself to do things in response to events since last session. Patient reported he was invited to a friend's home yesterday and stated, it was just an effort. Patient reported he feels chronic pain and the side effects of a medication (brilinta ) are barriers to participating in activities. Patient reported he experienced difficulty socializing with others prior to experiencing a heart attack. Patient reported increased discomfort during the winter months are a barrier to participating in activities. Patient reported he tries to change his vocabulary to reflect positivity.  Patient reported he continues to participate in patient's community HOA (homeowner's association) and reported stress related to participation in the [facility]. Patient reported he has decided if the  [facility] does not allow patient to be member at large he will resign from the [employer] [facility]. Patient reported experiencing the thought, its icky outside prior to going to friend's home yesterday. Patient stated, I'm tired of being in the house but my money is limited.  Patient reported a history of taking antidepressants. Patient stated, Tai chi did more for me that medication or any other thing and patient reported he is trying to resume Tai chi. Patient stated, I'll give it a whirl in response to gratitude journal. Patient reported he plans to follow up with [employer] to inquire about volunteer opportunities. Patient stated, ok in response to current mood.    Interventions: Cognitive Behavioral Therapy. Clinician conducted session via caregility video from clinician's home office. Patient provided verbal consent to proceed with telehealth session and is aware of limitations of telephone or video visits. Patient participated in session from patient's home. Reviewed events since last session. Explored barriers to participation in activities. Discussed strategies to increase activity level and decrease isolation, such as, going to the local [employer] versus exercising at home. Discussed current stressors and patient's decision regarding the HOA. Assisted patient in exploring and identifying thoughts associated with going to patient's friend's home. Assessed for depressive symptoms. Provided psycho education related to depressive symptoms. Discussed recent life changes and explored the impact on patient's mood. Provided psycho education related to gratitude exercises. Clinician requested patient maintain a gratitude journal for homework.    Collaboration of Care: Other not required at this time   Diagnosis:  Anxiety disorder, unspecified type     Plan: Patient is to utilize [employer], thought re-framing, relaxation techniques, opposite action, mindfulness and coping strategies to decrease symptoms  associated with  their diagnosis. Frequency: bi-weekly  Modality: individual      Long-term goal:   Reduce overall level, frequency, and intensity of the feelings of anxiety as evidenced by decreased increased heart rate, shortness of breath, decreased energy, loss of interest from 7 days/week to 0 to 1 days/week per patient report for at least 3 consecutive months. Target Date: 06/26/24  Progress: progressing    Short-term goal:  Increase patient's awareness of patient's verbal and nonverbal communication with others and how patient's communication is perceived by others Target Date: 12/25/23  Progress: progressing    Develop and implement healthy communication strategies for patient to utilize when expressing his thoughts and feelings to others   Target Date: 12/25/23  Progress: progressing    Develop and implement coping strategies for patient to utilize to decrease the negative impact chronic pain has on patient's ability to participate in family activities Target Date: 12/25/23  Progress: progressing    Maintain physical activity 3-5 days per week  Target Date: 12/25/23  Progress: progressing    Increase patient's participation in activities patient enjoys, such as, Tai Chi, teaching opportunities, volunteer opportunities,  Target Date: 12/25/23  Progress: progressing     Darice Seats, LCSW

## 2023-10-03 NOTE — Progress Notes (Signed)
   Ronald Barthel, LCSW

## 2023-10-14 ENCOUNTER — Ambulatory Visit: Payer: PPO | Admitting: Clinical

## 2023-10-14 DIAGNOSIS — F419 Anxiety disorder, unspecified: Secondary | ICD-10-CM | POA: Diagnosis not present

## 2023-10-14 NOTE — Progress Notes (Signed)
                Dezi Schaner, LCSW 

## 2023-10-14 NOTE — Progress Notes (Signed)
[facility] Counselor/Therapist Progress Note  Patient ID: ZAEQUAN HARTON, MRN: [id number],    Date: 10/14/2023  Time Spent: 1:32pm - 2:23pm : 51 minutes   Treatment Type: Individual Therapy  Reported Symptoms: Patient reported anxiety and worry  Mental Status Exam: Appearance:  Neat and Well Groomed     Behavior: Appropriate  Motor: Normal  Speech/Language:  Clear and Coherent  Affect: Appropriate  Mood: normal  Thought process: tangential  Thought content:   Tangential  Sensory/Perceptual disturbances:   WNL  Orientation: oriented to person, place, and situation  Attention: Good  Concentration: Good  Memory: WNL  Fund of knowledge:  Good  Insight:   Good  Judgment:  Good  Impulse Control: Good   Risk Assessment: Danger to Self:  No Patient denied current suicidal ideation  Self-injurious Behavior: No Danger to Others: No Patient denied current homicidal ideation Duty to Warn:no Physical Aggression / Violence:No  Access to Firearms a concern: No  Gang Involvement:No   Subjective: Patient reported he has an appointment with a new psychiatrist, Horatio Pel, [place] in [place] in February and has an appointment with patient's current psychiatrist in two weeks. Patient reported patient recently took his wife to see a neurosurgeon and there is still a possibility for surgery. Patient reported his wife is scheduled to receive an injection to treat back pain. Patient reported concern about the financial impact if his wife is out of work due to surgery.  Patient reported he utilizes the calm app several times per day and meditation. Patient stated, "I feel good", "Im in a good mood" in response to patient's mood today. Patient reported he experiences fatigue. Patient stated, "I have documented some stuff in there" in response to patient's gratitude journal. Patient reported he is grateful for his wife, his dog, his friends, warmer weather, his mother, ability  to pay his bills, the roof over his head, having a nice meal, grateful he can still stand. Patient reported he has modified the way patient makes meals to decrease standing. Patient stated, "I feel good in the warmer weather". Patient stated, "I feel lucky, lucky I have doctors that look out for me". Patient identified thinking about wife having back surgery, patient's experience with back surgeries, and others' experiences with back surgeries are triggers for anxiety. Patient stated, "it just comes and goes in waves I guess" in reference to anxiety. Patient reported when he starts thinking about issues after waking in the middle of the night he experiences difficulty returning to sleep.     Interventions: Cognitive Behavioral Therapy. Clinician conducted session via caregility video from clinician's home office. Patient provided verbal consent to proceed with telehealth session and is aware of limitations of telephone or video visits. Patient participated in session from patient's home. Discussed patient's transition to a new psychiatrist. Reviewed events since last session. Discussed patient's concerns related to his wife's back pain and treatment. Assessed patient's mood. Reviewed patient's thought record and patient's gratitude journal.  Assessed symptoms of anxiety since last session. Assisted patient in exploring and identifying triggers for anxiety/worry. Provided psycho education related to use of a worry journal. Clinician requested patient maintain a gratitude journal and worry journal for homework.    Collaboration of Care: Other not required at this time   Diagnosis:  Anxiety disorder, unspecified type     Plan: Patient is to utilize [employer] Therapy, thought re-framing, relaxation techniques, opposite action, mindfulness and coping strategies to decrease symptoms associated with their diagnosis. Frequency: bi-weekly  Modality: individual      Long-term goal:   Reduce overall  level, frequency, and intensity of the feelings of anxiety as evidenced by decreased increased heart rate, shortness of breath, decreased energy, loss of interest from 7 days/week to 0 to 1 days/week per patient report for at least 3 consecutive months. Target Date: 06/26/24  Progress: progressing    Short-term goal:  Increase patient's awareness of patient's verbal and nonverbal communication with others and how patient's communication is perceived by others Target Date: 12/25/23  Progress: progressing    Develop and implement healthy communication strategies for patient to utilize when expressing his thoughts and feelings to others   Target Date: 12/25/23  Progress: progressing    Develop and implement coping strategies for patient to utilize to decrease the negative impact chronic pain has on patient's ability to participate in family activities Target Date: 12/25/23  Progress: progressing    Maintain physical activity 3-5 days per week  Target Date: 12/25/23  Progress: progressing    Increase patient's participation in activities patient enjoys, such as, Tai Chi, teaching opportunities, volunteer opportunities,  Target Date: 12/25/23  Progress: progressing    Doree Barthel, LCSW

## 2023-10-25 DIAGNOSIS — F322 Major depressive disorder, single episode, severe without psychotic features: Secondary | ICD-10-CM | POA: Diagnosis not present

## 2023-11-02 DIAGNOSIS — G894 Chronic pain syndrome: Secondary | ICD-10-CM | POA: Diagnosis not present

## 2023-11-02 DIAGNOSIS — G5752 Tarsal tunnel syndrome, left lower limb: Secondary | ICD-10-CM | POA: Diagnosis not present

## 2023-11-02 DIAGNOSIS — Z79891 Long term (current) use of opiate analgesic: Secondary | ICD-10-CM | POA: Diagnosis not present

## 2023-11-02 DIAGNOSIS — M961 Postlaminectomy syndrome, not elsewhere classified: Secondary | ICD-10-CM | POA: Diagnosis not present

## 2023-11-10 ENCOUNTER — Ambulatory Visit: Payer: PPO | Admitting: Clinical

## 2023-11-10 DIAGNOSIS — F419 Anxiety disorder, unspecified: Secondary | ICD-10-CM | POA: Diagnosis not present

## 2023-11-10 NOTE — Progress Notes (Signed)
Doree Barthel, LCSW

## 2023-11-10 NOTE — Progress Notes (Signed)
[facility] Counselor/Therapist Progress Note  Patient ID: [name], MRN: [id number],    Date: 11/10/2023  Time Spent: 1:36pm - 2:31pm : 55 minutes   Treatment Type: Individual Therapy  Reported Symptoms: Patient reported difficulty sleeping, experiencing pain  Mental Status Exam: Appearance:  Neat and Well Groomed     Behavior: Appropriate  Motor: Normal  Speech/Language:  Clear and Coherent and Normal Rate  Affect: Appropriate  Mood: normal  Thought process: tangential  Thought content:   Tangential  Sensory/Perceptual disturbances:   Patient reported currently experiencing pain  Orientation: oriented to person, place, and situation  Attention: Good  Concentration: Good  Memory: WNL  Fund of knowledge:  Good  Insight:   Good  Judgment:  Good  Impulse Control: Good   Risk Assessment: Danger to Self:  No Patient denied current suicidal ideation  Self-injurious Behavior: No Danger to Others: No Patient denied current homicidal ideation Duty to Warn:no Physical Aggression / Violence:No  Access to Firearms a concern: No  Gang Involvement:No   Subjective: Patient stated, "not good" in response to events since last session. Patient stated, "I'm in so much pain it's unbelievable, my heart feels weird, it gets old, it just gets old". Patient stated, "I think my feet out do my spinal column" in reference to pain. Patient reported his left foot started going numb and patient reported physicians have not been able to identify the cause for pain in patient's foot. Patient reported he does not take the amount of pain medication currently prescribed and reported concern about side effects.  Patient stated, "I'm just frustrated having no life from day to day because I can't plan anything right now". Patient stated, "I will feel much better once I'm off this medication" in reference to brilinta. Patient reported he plans to discuss his current medications with his  physician to inquire if any of patient's current medications could impact patient's pain level. Patient stated, "I try to be positive but that voice in my head starts acting up", "I literally tell it to be quiet". Patient stated, "gratitude journal is going good", "worry journal I can fill that up". Patient stated, "I ask myself is there anything I can do to help it" in response to feelings of worry. Patient reported patient's psychiatrist recommended patient consider leaving the [facility] board. Patient reported he enjoys participating in the [facility] board and reported enjoyment of the HOA is not worth the stress of participation. Patient stated, "If I leave the board then I know nothing", "I like to know what's going on". Patient reported he plans to talk with the [facility] president about becoming a member at large and stated, "if not ill just resign, I don't need the stress". Patient stated, "its ok" in response to current mood. Patient stated, "right now pain is over ruling everything in the moment".   Interventions: Cognitive Behavioral Therapy. Clinician conducted session via caregility video from clinician's office at [facility]. Patient provided verbal consent to proceed with telehealth session and is aware of limitations of telephone or video visits. Patient participated in session from patient's home. Reviewed events since last session. Provided supportive therapy and active listening as patient discussed pain in patient's feet and patient's response. Assisted patient in exploring pros/cons of taking current pain medication. Discussed the impact of pain on patient's activities and coping strategies patient implements in response to pain. Reviewed patient's use of gratitude journal and worry journal. Discussed patient's participation on [facility] board and assisted patient  in exploring/identifying pros/cons to continuing patient's participation. Assessed patient's mood. Clinician requested patient maintain a  gratitude journal and document pros/cons related to pain medication and patient's participation on [facility] board.   Collaboration of Care: Other not required at this time   Diagnosis:  Anxiety disorder, unspecified type     Plan: Patient is to utilize [employer] Therapy, thought re-framing, relaxation techniques, opposite action, mindfulness and coping strategies to decrease symptoms associated with their diagnosis. Frequency: bi-weekly  Modality: individual      Long-term goal:   Reduce overall level, frequency, and intensity of the feelings of anxiety as evidenced by decreased increased heart rate, shortness of breath, decreased energy, loss of interest from 7 days/week to 0 to 1 days/week per patient report for at least 3 consecutive months. Target Date: 06/26/24  Progress: progressing    Short-term goal:  Increase patient's awareness of patient's verbal and nonverbal communication with others and how patient's communication is perceived by others Target Date: 12/25/23  Progress: progressing    Develop and implement healthy communication strategies for patient to utilize when expressing his thoughts and feelings to others   Target Date: 12/25/23  Progress: progressing    Develop and implement coping strategies for patient to utilize to decrease the negative impact chronic pain has on patient's ability to participate in family activities Target Date: 12/25/23  Progress: progressing    Maintain physical activity 3-5 days per week  Target Date: 12/25/23  Progress: progressing    Increase patient's participation in activities patient enjoys, such as, Tai Chi, teaching opportunities, volunteer opportunities,  Target Date: 12/25/23  Progress: progressing       Doree Barthel, LCSW

## 2023-11-14 DIAGNOSIS — F411 Generalized anxiety disorder: Secondary | ICD-10-CM | POA: Diagnosis not present

## 2023-12-06 ENCOUNTER — Ambulatory Visit: Payer: PPO | Admitting: Clinical

## 2023-12-06 DIAGNOSIS — F419 Anxiety disorder, unspecified: Secondary | ICD-10-CM

## 2023-12-06 NOTE — Progress Notes (Signed)
   Ronald Barthel, LCSW

## 2023-12-06 NOTE — Progress Notes (Signed)
 [facility] Counselor/Therapist Progress Note  Patient ID: [name], MRN: [id number],    Date: 12/06/2023  Time Spent: 1:34pm - 2:25pm : 51 minutes   Treatment Type: Individual Therapy  Reported Symptoms: Patient reported anxiety  Mental Status Exam: Appearance:  Neat and Well Groomed     Behavior: Appropriate  Motor: Normal  Speech/Language:  Clear and Coherent and Normal Rate  Affect: Appropriate  Mood: normal  Thought process: normal  Thought content:   WNL  Sensory/Perceptual disturbances:   Patient reported he is not feeling well today  Orientation: oriented to person, place, and situation  Attention: Good  Concentration: Good  Memory: WNL  Fund of knowledge:  Good  Insight:   Good  Judgment:  Good  Impulse Control: Good   Risk Assessment: Danger to Self:  No Patient denied current suicidal ideation  Self-injurious Behavior: No Danger to Others: No Patient denied current homicidal ideation Duty to Warn:no Physical Aggression / Violence:No  Access to Firearms a concern: No  Gang Involvement:No   Subjective: Patient reported he has not been feeling well today. Patient stated, "things have been different" in response to events since last session. Patient reported he recently met with a new psychiatrist, Dr. Horatio Pel at [facility]. Patient stated, "I left it at, let me mull things over a little bit and I'll make another appointment" in reference to appointment with psychiatrist. Patient reported his aunt was recently involuntarily committed and is currently in the hospital awaiting psychiatric hospitalization. Patient reported there is an HOA meeting tonight and patient reported he has been trying to reach out to the [facility] president to ask to become a member at large. Patient stated, "tonight's the culmination of all the hard work I've put together" in reference to upcoming [facility] meeting. Patient stated, "they're about  even" in reference to pros/cons for participation in the [facility]. 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 Patient reported if he is unable to be a member at large patient will resign from the [facility]. Patient stated, "there are sometimes where I just get anxious and sometimes where I think it's just my heart". Patient stated, "Ill give that a try" in response to mindfulness exercises.   Interventions: Cognitive Behavioral Therapy. Clinician conducted session via caregility video from clinician's office at [facility]. Patient provided verbal consent to proceed with telehealth session and is aware of limitations of telephone or video visits. Patient participated in session from patient's home. Reviewed events since last session and assessed for changes. Discussed patient's recent appointment with a psychiatrist. Provided supportive therapy and active listening as patient discussed recent concerns related to patient's aunt. Reviewed patient's homework to identify pros/cons of participation in the [facility]. Assessed patient's mood since last session and current mood. Provided psycho education related to mindfulness exercises of the 5 senses and mindful eating. Clinician requested patient maintain a gratitude journal and practice mindfulness exercises.    Collaboration of Care: Other not required at this time   Diagnosis:  Anxiety disorder, unspecified type     Plan: Patient is to utilize [employer], thought re-framing, relaxation techniques, opposite action, mindfulness and coping strategies to decrease symptoms associated with their diagnosis. Frequency: bi-weekly  Modality: individual      Long-term goal:   Reduce overall level, frequency, and intensity of the feelings of anxiety as evidenced by decreased increased heart rate, shortness of breath, decreased energy,  loss of interest from 7 days/week to 0 to 1 days/week per patient  report for at least 3 consecutive months. Target Date: 06/26/24  Progress: progressing    Short-term goal:  Increase patient's awareness of patient's verbal and nonverbal communication with others and how patient's communication is perceived by others Target Date: 12/25/23  Progress: progressing    Develop and implement healthy communication strategies for patient to utilize when expressing his thoughts and feelings to others   Target Date: 12/25/23  Progress: progressing    Develop and implement coping strategies for patient to utilize to decrease the negative impact chronic pain has on patient's ability to participate in family activities Target Date: 12/25/23  Progress: progressing    Maintain physical activity 3-5 days per week  Target Date: 12/25/23  Progress: progressing    Increase patient's participation in activities patient enjoys, such as, Tai Chi, teaching opportunities, volunteer opportunities,  Target Date: 12/25/23  Progress: progressing    Doree Barthel, LCSW

## 2023-12-12 ENCOUNTER — Ambulatory Visit: Payer: PPO | Admitting: Adult Health

## 2023-12-12 ENCOUNTER — Encounter: Payer: Self-pay | Admitting: Physician Assistant

## 2023-12-12 ENCOUNTER — Ambulatory Visit: Payer: PPO | Attending: Physician Assistant | Admitting: Physician Assistant

## 2023-12-12 VITALS — BP 128/88 | HR 87 | Ht 68.0 in | Wt 171.8 lb

## 2023-12-12 DIAGNOSIS — Z79899 Other long term (current) drug therapy: Secondary | ICD-10-CM

## 2023-12-12 DIAGNOSIS — E785 Hyperlipidemia, unspecified: Secondary | ICD-10-CM

## 2023-12-12 DIAGNOSIS — I2510 Atherosclerotic heart disease of native coronary artery without angina pectoris: Secondary | ICD-10-CM

## 2023-12-12 DIAGNOSIS — I4719 Other supraventricular tachycardia: Secondary | ICD-10-CM | POA: Diagnosis not present

## 2023-12-12 DIAGNOSIS — R002 Palpitations: Secondary | ICD-10-CM

## 2023-12-12 NOTE — Patient Instructions (Signed)
 Medication Instructions:  DISCONTINUED BRILINTA *If you need a refill on your cardiac medications before your next appointment, please call your pharmacy*   Lab Work: FASTING LIPID PANEL- IN 6 MONTHS If you have labs (blood work) drawn today and your tests are completely normal, you will receive your results only by: MyChart Message (if you have MyChart) OR A paper copy in the mail If you have any lab test that is abnormal or we need to change your treatment, we will call you to review the results.   Testing/Procedures: NO TESTING   Follow-Up: At [facility], you and your health needs are our priority.  As part of our continuing mission to provide you with exceptional heart care, we have created designated Provider Care Teams.  These Care Teams include your primary Cardiologist (physician) and Advanced Practice Providers (APPs -  Physician Assistants and Nurse Practitioners) who all work together to provide you with the care you need, when you need it.  Your next appointment:   6 month(s)  Provider:   Bryan Lemma, MD   Other Instructions

## 2023-12-12 NOTE — Progress Notes (Signed)
 Cardiology Office Note:  .   Date:  12/12/2023  ID:  Violet Baldy, DOB 27-Jun-1961, MRN [id number] PCP: Nelwyn Salisbury, MD  [facility] Providers Cardiologist:  Bryan Lemma, MD     History of Present Illness: .   [name] is a 63 y.o. male with PMH of CAD, anxiety/depression, chronic pain syndrome, ADHD, tobacco abuse and hyperlipidemia.  He has multiple allergies.  He was admitted in March 2024 with NSTEMI.  Subsequent cardiac catheterization revealed patent left main and LAD, eccentric nonobstructive mid RCA stenosis with diffuse plaquing, severe subtotal 99% stenosis in mid left circumflex artery with sequential lesion into the branching OM, this was treated with 2.25 x 24 mm Synergy DES.  EF was 55%.  Echocardiogram obtained on 12/11/2022 showed EF 60 to 65%, grade 1 DD, no regional wall motion abnormality, no significant valve issue.  Postprocedure, patient was placed on aspirin and Brilinta.  He was seen by Dr. Herbie Baltimore in April 2024 at which time he had a lot of atypical symptoms such as fatigue and chest discomfort.  It was suspected some of his symptoms may be related to stress and anxiety.  He has significant fatigue on the metoprolol, metoprolol was eventually discontinued in August 2024.  Heart monitor in August shows no significant arrhythmia despite his complaint of palpitation.  He was started on diltiazem which he could not tolerate due to leg swelling.  I last saw the patient in September 2024 at which time he had multiple complaints.  He continued to have morning palpitation that wakes him up.  I suspected the morning palpitation may be related to adrenaline rush and the release more cortisol.  He complained of occasional spontaneous dyspnea more noticeable when the weather is humid and hot.  Although some of the symptoms may be more related to Brilinta, however after comparing the efficacy between Plavix and the Brilinta, we ultimately decided to leave him off  Brilinta.  He was seen by Joni Reining, NP on 07/12/2023, he was started on very low-dose of bisoprolol and nighttime to help with morning palpitation.  Last lipid panel was obtained by PCP in December 2024 which showed LDL of 79, total cholesterol 148, triglycerides 72, HDL 54.  Stable renal function and red blood cell count.  TSH normal.  Liver enzymes normal.  At the time of his PCPs visit in December, he mentioned to his PCP that he did not feel much different after starting on the bisoprolol, therefore he self discontinued it.  Patient presents today for follow-up.  He is now more than 1 year out from the stent placement, from the cardiac perspective he can stop the Brilinta.  I advised him to continue on the aspirin indefinitely.  His blood pressure is very well-controlled.  He continued to have morning episode of racing heartbeat.  He also has peripheral neuropathy with pain in the bilateral foot.  There is no pain in the thigh or calf.  From the cardiac perspective, he is doing well.  He has occasional chest discomfort but this does not occur with physical exertion.  He is able to ride a elliptical for 25 minutes or longer without any issue.  Otherwise, he can follow-up with Dr. Herbie Baltimore in 6 months.  I reviewed the recent lipid panel with him, LDL remain high at 79, he was agreeable for trial of diet and exercise, we plan to repeat fasting lipid panel in 6 months, if LDL remains greater than 70, he says  he would be agreeable for a trial of Zetia therapy.   ROS:   He denies chest pain, palpitations, dyspnea, pnd, orthopnea, n, v, dizziness, syncope, edema, weight gain, or early satiety. All other systems reviewed and are otherwise negative except as noted above.    Studies Reviewed: Marland Kitchen   EKG Interpretation Date/Time:  Monday December 12 2023 13:40:51 EDT Ventricular Rate:  87 PR Interval:  152 QRS Duration:  88 QT Interval:  366 QTC Calculation: 440 R Axis:   21  Text  Interpretation: Normal sinus rhythm Normal ECG When compared with ECG of 11-Dec-2022 05:51, No significant change was found Confirmed by Azalee Course [phone]) on 12/12/2023 1:50:43 PM    Cardiac Studies & Procedures   ______________________________________________________________________________________________ CARDIAC CATHETERIZATION  CARDIAC CATHETERIZATION 12/10/2022  Narrative 1.  Patent left main and LAD with mild nonobstructive plaquing but no significant stenosis 2.  Moderate eccentric nonobstructive mid RCA stenosis with diffuse plaquing throughout the large dominant RCA 3.  Severe subtotal 99% stenosis of the mid circumflex with a sequential lesion into the branching obtuse marginal, treated successfully with PCI using a 2.25 x 24 mm Synergy DES 4.  Mild segmental LV contraction abnormality with mild hypokinesis of the anterolateral wall and preserved overall LVEF of 55%  Recommendations: Aggressive medical therapy, DAPT with aspirin and ticagrelor at least 12 months without an interruption.  As long as no complications arise, hospital discharge tomorrow.  Findings Coronary Findings Diagnostic  Dominance: Right  Left Main The vessel exhibits minimal luminal irregularities.  Left Anterior Descending There is mild diffuse disease throughout the vessel.  Left Circumflex Mid Cx lesion is 99% stenosed.  Right Coronary Artery There is mild diffuse disease throughout the vessel. Prox RCA lesion is 30% stenosed. Mid RCA lesion is 50% stenosed. The lesion is eccentric.  Intervention  Mid Cx lesion Stent CATH LAUNCHER 6FR EBU3.5 guide catheter was inserted. Lesion crossed with guidewire using a WIRE COUGAR XT STRL 190CM. Pre-stent angioplasty was performed using a BALLN SAPPHIRE 2.0X12. A drug-eluting stent was successfully placed using a SYNERGY XD 2.25X24. Post-stent angioplasty was performed using a BALL SAPPHIRE NC24 2.50X15. Maximum pressure:  20 atm. Post-Intervention Lesion  Assessment The intervention was successful. Pre-interventional TIMI flow is 3. Post-intervention TIMI flow is 3. No complications occurred at this lesion. There is a 0% residual stenosis post intervention.     ECHOCARDIOGRAM  ECHOCARDIOGRAM COMPLETE 12/11/2022  Narrative ECHOCARDIOGRAM REPORT    Patient Name:   ALPER GUILMETTE Date of Exam: 12/11/2022 Medical Rec #:  [id number]            Height:       69.0 in Accession #:    [id number]           Weight:       165.0 lb Date of Birth:  May 14, 1961            BSA:          1.904 m Patient Age:    61 years             BP:           119/91 mmHg Patient Gender: M                    HR:           81 bpm. Exam Location:  Inpatient  Procedure: 2D Echo, Cardiac Doppler and Color Doppler  Indications:    Chest Pain  History:  Patient has no prior history of Echocardiogram examinations. Signs/Symptoms:Chest Pain; Risk Factors:Hypertension.  Sonographer:    Lucy Antigua Referring Phys: Clydie Braun  IMPRESSIONS   1. Left ventricular ejection fraction, by estimation, is 60 to 65%. The left ventricle has normal function. The left ventricle has no regional wall motion abnormalities. Left ventricular diastolic parameters are consistent with Grade I diastolic dysfunction (impaired relaxation). 2. Right ventricular systolic function is normal. The right ventricular size is normal. There is normal pulmonary artery systolic pressure. 3. No evidence of mitral valve regurgitation. 4. Aortic valve regurgitation is not visualized. 5. The inferior vena cava is normal in size with greater than 50% respiratory variability, suggesting right atrial pressure of 3 mmHg.  Comparison(s): No prior Echocardiogram.  Conclusion(s)/Recommendation(s): Normal biventricular function without evidence of hemodynamically significant valvular heart disease.  FINDINGS Left Ventricle: Left ventricular ejection fraction, by estimation, is 60 to 65%. The  left ventricle has normal function. The left ventricle has no regional wall motion abnormalities. The left ventricular internal cavity size was normal in size. There is no left ventricular hypertrophy. Left ventricular diastolic parameters are consistent with Grade I diastolic dysfunction (impaired relaxation).  Right Ventricle: The right ventricular size is normal. Right ventricular systolic function is normal. There is normal pulmonary artery systolic pressure. The tricuspid regurgitant velocity is 1.96 m/s, and with an assumed right atrial pressure of 3 mmHg, the estimated right ventricular systolic pressure is 18.4 mmHg.  Left Atrium: Left atrial size was normal in size.  Right Atrium: Right atrial size was normal in size.  Pericardium: There is no evidence of pericardial effusion.  Mitral Valve: No evidence of mitral valve regurgitation.  Tricuspid Valve: Tricuspid valve regurgitation is not demonstrated.  Aortic Valve: Aortic valve regurgitation is not visualized. Aortic valve mean gradient measures 4.0 mmHg. Aortic valve peak gradient measures 7.2 mmHg. Aortic valve area, by VTI measures 2.58 cm.  Pulmonic Valve: Pulmonic valve regurgitation is not visualized.  Aorta: The aortic root and ascending aorta are structurally normal, with no evidence of dilitation.  Venous: The inferior vena cava is normal in size with greater than 50% respiratory variability, suggesting right atrial pressure of 3 mmHg.  IAS/Shunts: The interatrial septum was not well visualized.   LEFT VENTRICLE PLAX 2D LVIDd:         4.10 cm     Diastology LVIDs:         2.70 cm     LV e' medial:    5.00 cm/s LV PW:         0.95 cm     LV E/e' medial:  12.2 LV IVS:        0.83 cm     LV e' lateral:   6.85 cm/s LVOT diam:     2.20 cm     LV E/e' lateral: 8.9 LV SV:         63 LV SV Index:   33 LVOT Area:     3.80 cm  LV Volumes (MOD) LV vol d, MOD A4C: 59.0 ml LV vol s, MOD A4C: 28.4 ml LV SV MOD A4C:      59.0 ml  RIGHT VENTRICLE RV S prime:     12.80 cm/s TAPSE (M-mode): 2.0 cm  LEFT ATRIUM             Index        RIGHT ATRIUM           Index LA diam:  3.50 cm 1.84 cm/m   RA Area:     10.20 cm LA Vol (A2C):   35.2 ml 18.49 ml/m  RA Volume:   18.90 ml  9.93 ml/m LA Vol (A4C):   38.2 ml 20.06 ml/m LA Biplane Vol: 37.4 ml 19.64 ml/m AORTIC VALVE AV Area (Vmax):    2.51 cm AV Area (Vmean):   2.35 cm AV Area (VTI):     2.58 cm AV Vmax:           134.00 cm/s AV Vmean:          88.700 cm/s AV VTI:            0.243 m AV Peak Grad:      7.2 mmHg AV Mean Grad:      4.0 mmHg LVOT Vmax:         88.60 cm/s LVOT Vmean:        54.900 cm/s LVOT VTI:          0.165 m LVOT/AV VTI ratio: 0.68  AORTA Ao Root diam: 3.00 cm Ao Asc diam:  3.10 cm  MITRAL VALVE               TRICUSPID VALVE MV Area (PHT): 3.66 cm    TR Peak grad:   15.4 mmHg MV Decel Time: 207 msec    TR Vmax:        196.00 cm/s MV E velocity: 60.80 cm/s MV A velocity: 78.10 cm/s  SHUNTS MV E/A ratio:  0.78        Systemic VTI:  0.16 m Systemic Diam: 2.20 cm  Carolan Clines Electronically signed by Carolan Clines Signature Date/Time: 12/11/2022/12:42:17 PM    Final    MONITORS  LONG TERM MONITOR (3-14 DAYS) 05/12/2023  Narrative   The dominant rhythm is normal sinus rhythm with normal circadian variation.   There are rare premature atrial contractions and only 2 very brief episodes of nonsustained atrial tachycardia (4-5 beats long).  There is no evidence of atrial fibrillation.   There are rare premature ventricular contractions and there is no evidence of complex ventricular arrhythmia.   There are no episodes of severe bradycardia, pauses or high-grade atrioventricular block.   Several symptom triggered recordings are submitted for analysis.  These all showed normal sinus rhythm or mild sinus tachycardia.  Minimal abnormalities on 6-day event monitor. During symptomatic episodes, the rhythm is normal. 2  very brief episodes of ectopic atrial tachycardia lasting a maximum 5 beats are seen and are of doubtful clinical significance.  Patch Wear Time:  6 days and 0 hours (2024-08-02T19:07:18-0400 to 2024-08-08T19:42:30-0400)  Patient had a min HR of 54 bpm, max HR of 141 bpm, and avg HR of 90 bpm. Predominant underlying rhythm was Sinus Rhythm. 2 Supraventricular Tachycardia runs occurred, the run with the fastest interval lasting 4 beats with a max rate of 113 bpm, the longest lasting 5 beats with an avg rate of 111 bpm. Isolated SVEs were rare (<1.0%), SVE Couplets were rare (<1.0%), and no SVE Triplets were present. Isolated VEs were rare (<1.0%), and no VE Couplets or VE Triplets were present.       ______________________________________________________________________________________________      Risk Assessment/Calculations:             Physical Exam:   VS:  BP 128/88 (BP Location: Left Arm, Patient Position: Sitting)   Pulse 87   Ht 5\' 8"  (1.727 m)   Wt 171 lb 12.8 oz (77.9 kg)  SpO2 98%   BMI 26.12 kg/m    Wt Readings from Last 3 Encounters:  12/12/23 171 lb 12.8 oz (77.9 kg)  09/01/23 169 lb (76.7 kg)  07/12/23 171 lb 3.2 oz (77.7 kg)    GEN: Well nourished, well developed in no acute distress NECK: No JVD; No carotid bruits CARDIAC: RRR, no murmurs, rubs, gallops RESPIRATORY:  Clear to auscultation without rales, wheezing or rhonchi  ABDOMEN: Soft, non-tender, non-distended EXTREMITIES:  No edema; No deformity   ASSESSMENT AND PLAN: .    Morning Palpitations Morning palpitations possibly related to adrenaline rush and cortisol release. Previously tried bisoprolol without improvement.  Coronary Artery Disease (CAD) with NSTEMI NSTEMI in March 2024 treated with drug-eluting stent.  No significant arrhythmia. Occasional non-exertional chest discomfort.  - continue aspirin 81 mg indefinitely. - Discontinue Brilinta as patient has completed 1 year course of  DAPT.  Hyperlipidemia LDL elevated at 79 mg/dL despite maximum Lipitor. Discussed dietary and exercise interventions. Consider Zetia if LDL remains above 70 mg/dL after six months. - Encourage dietary modifications and increased exercise. - Repeat fasting lipid panel in six months. - Consider adding Zetia if LDL remains above 70 mg/dL.         Dispo: follow up with Dr. Herbie Baltimore in 6 month  Signed, Azalee Course, [place]

## 2023-12-23 ENCOUNTER — Encounter: Payer: Self-pay | Admitting: Family Medicine

## 2023-12-26 MED ORDER — ACYCLOVIR 5 % EX CREA: 1.0000 | TOPICAL_CREAM | CUTANEOUS | 11 refills | Status: DC

## 2023-12-26 NOTE — Telephone Encounter (Signed)
 Done

## 2023-12-27 ENCOUNTER — Other Ambulatory Visit: Payer: Self-pay | Admitting: Family Medicine

## 2023-12-29 DIAGNOSIS — G894 Chronic pain syndrome: Secondary | ICD-10-CM | POA: Diagnosis not present

## 2023-12-29 DIAGNOSIS — M961 Postlaminectomy syndrome, not elsewhere classified: Secondary | ICD-10-CM | POA: Diagnosis not present

## 2023-12-29 DIAGNOSIS — Z79891 Long term (current) use of opiate analgesic: Secondary | ICD-10-CM | POA: Diagnosis not present

## 2023-12-29 DIAGNOSIS — G5752 Tarsal tunnel syndrome, left lower limb: Secondary | ICD-10-CM | POA: Diagnosis not present

## 2023-12-29 NOTE — Telephone Encounter (Signed)
**Note De-identified  [place] Obfuscation** Please advise 

## 2024-01-04 ENCOUNTER — Ambulatory Visit: Admitting: Clinical

## 2024-01-04 DIAGNOSIS — F419 Anxiety disorder, unspecified: Secondary | ICD-10-CM | POA: Diagnosis not present

## 2024-01-04 NOTE — Progress Notes (Signed)
   Doree Barthel, LCSW

## 2024-01-04 NOTE — Progress Notes (Addendum)
 [facility] Counselor/Therapist Progress Note  Patient ID: [name], MRN: [id number],    Date: 01/04/2024  Time Spent: 1:33pm - 2:30pm : 57 minutes   Treatment Type: Individual Therapy  Reported Symptoms:  anxiety  Mental Status Exam: Appearance:  Neat and Well Groomed     Behavior: Appropriate  Motor: Normal  Speech/Language:  Clear and Coherent and Normal Rate  Affect: Appropriate  Mood: anxious  Thought process: normal  Thought content:   WNL  Sensory/Perceptual disturbances:   WNL  Orientation: oriented to person, place, and situation  Attention: Good  Concentration: Good  Memory: WNL  Fund of knowledge:  Good  Insight:   Good  Judgment:  Good  Impulse Control: Good   Risk Assessment: Danger to Self:  No Patient denied current suicidal ideation  Self-injurious Behavior: No Danger to Others: No Patient denied current homicidal ideation Duty to Warn:no Physical Aggression / Violence:No  Access to Firearms a concern: No  Gang Involvement:No   Subjective: Patient stated, "yea a couple of things are about to change majorly, one thing is they finally took me off the brilinta, that chest thing has improved about 50%". Patient reported "there are certain days I get a lot of anxiety" and reported feeling "antsy". Patient reported patient's pain management provider has indicated provider is being required to decrease patient's pain medication and patient reported he has been "incapacitated" when pain medication has been decreased in the past. Patient reported feeling "better" since physician discontinued brilliant. Patient reported he continues to experience pain in patient's feet and the cause has not been determined for the pain in patient's feet. Patient reported feelings of frustration in response to pain and other's response to patient when he is experiencing pain. Patient reported he decided to no longer participate on the [facility] board and stated, "its  not worth the effort". Patient stated, "Its like a big weight's lifted off my shoulders" in reference to discontinuing the [employer] board. Patient stated, "I would say pretty good, I'm always learning" as it relates to patient's first short term goal. Patient stated, "I express myself very well, I don't like yelling" in response to patient's second short term goal. Patient stated, "the biggest player with all of it is pain". Patient stated, "I got no where with that one" in response to patient's third short term goal. Patient stated, "I'd score that 100%" in response to patient's fourth short term goal. Patient stated, "not right now" in response to patient's fifth short term goal.   Interventions: Cognitive Behavioral Therapy and Motivational Interviewing. Clinician conducted session via caregility video from clinician's office at [facility]. Patient provided verbal consent to proceed with telehealth session and is aware of limitations of telephone or video visits. Patient participated in session from patient's home. Reviewed events since last session and assessed for changes. Provided supportive therapy and active listening as patient discussed patient's thoughts/feelings and concerns associated with decrease in pain medication. Reviewed patient's short term goals and patient's progress towards goals.    Collaboration of Care: Other not required at this time   Diagnosis:  Anxiety disorder, unspecified type     Plan: Patient is to utilize [employer], thought re-framing, relaxation techniques, opposite action, mindfulness and coping strategies to decrease symptoms associated with their diagnosis. Frequency: bi-weekly  Modality: individual      Long-term goal:   Reduce overall level, frequency, and intensity of the feelings of anxiety as evidenced by decreased increased heart rate, shortness of  breath, decreased energy, loss of interest from 7 days/week to 0 to 1 days/week per  patient report for at least 3 consecutive months. Target Date: 06/26/24  Progress: progressing    Short-term goal:  Increase patient's awareness of patient's verbal and nonverbal communication with others and how patient's communication is perceived by others  Target Date: 06/26/24  Progress: progressing    Develop and implement healthy communication strategies for patient to utilize when expressing his thoughts and feelings to others    Target Date: 06/26/24  Progress: progressing    Develop and implement coping strategies for patient to utilize to decrease the negative impact chronic pain has on patient's ability to participate in family activities Target Date: 06/26/24  Progress: progressing    Maintain physical activity 3-5 days per week .  Target Date: 06/26/24  Progress: patient reported he feels this goal has been met    Increase patient's participation in activities patient enjoys, such as, Tai Chi, teaching opportunities, volunteer opportunities. Target Date: 06/26/24  Progress: progressing     Burlene Carpen, LCSW

## 2024-01-05 ENCOUNTER — Other Ambulatory Visit: Payer: Self-pay | Admitting: Family Medicine

## 2024-01-05 NOTE — Telephone Encounter (Signed)
 Ok to send new Rx for the Ointment? Please advise

## 2024-01-06 NOTE — Telephone Encounter (Signed)
 Yes, please change this to the ointment

## 2024-01-10 ENCOUNTER — Other Ambulatory Visit: Payer: Self-pay

## 2024-01-10 MED ORDER — ACYCLOVIR 5 % EX OINT: TOPICAL_OINTMENT | CUTANEOUS | 3 refills | Status: DC

## 2024-01-23 DIAGNOSIS — F322 Major depressive disorder, single episode, severe without psychotic features: Secondary | ICD-10-CM | POA: Diagnosis not present

## 2024-02-03 ENCOUNTER — Ambulatory Visit (INDEPENDENT_AMBULATORY_CARE_PROVIDER_SITE_OTHER): Admitting: Clinical

## 2024-02-03 DIAGNOSIS — F419 Anxiety disorder, unspecified: Secondary | ICD-10-CM

## 2024-02-03 NOTE — Progress Notes (Signed)
 [facility] Counselor/Therapist Progress Note  Patient ID: [name], MRN: [id number],    Date: 02/03/2024  Time Spent: 1:36pm - 2:33pm : 57 minutes  Treatment Type: Individual Therapy  Reported Symptoms: difficulty leaving the house  Mental Status Exam: Appearance:  Neat and Well Groomed     Behavior: Appropriate  Motor: Normal  Speech/Language:  Clear and Coherent and Normal Rate  Affect: Appropriate  Mood: normal  Thought process: normal  Thought content:   WNL  Sensory/Perceptual disturbances:   WNL  Orientation: oriented to person, place, and situation  Attention: Good  Concentration: Good  Memory: WNL  Fund of knowledge:  Good  Insight:   Good  Judgment:  Good  Impulse Control: Good   Risk Assessment: Danger to Self:  No Patient denied current suicidal ideation  Self-injurious Behavior: No Danger to Others: No Patient denied current homicidal ideation Duty to Warn:no Physical Aggression / Violence:No  Access to Firearms a concern: No  Gang Involvement:No   Subjective: Patient stated, "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 Patient reported feeling angry regarding the last HOA meeting.  Patient reported patient has an appointment with a podiatrist to address foot pain patient has been experiencing and plans to request an x-ray of his foot. Patient reported patient's psychiatrist recently prescribed patient citalopram and patient reported concerns about the medication. Patient reported patient reached out to the psychiatrist to discuss his concerns and stated, "I haven't gotten a clear answer". Patient stated, "my mom is having some real difficulties" and patient reported patient and patient's sister are uncertain how to approach the changes in their mother's cognitive functioning. Patient reported concerns related to his mother's driving.  Patient reported he feels his mother is  in denial of hearing loss and cognitive changes. Patient reported he feels his mother ignores patient's boundaries when patient establishes boundaries regarding completing repairs at his mother's home. Patient reported when trying to maintain his boundaries a conflict occurs with his mother. Patient stated, "I try to stay positive". Patient stated, "I have a hard time going out, I have to force myself to get out of the door". 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 Patient stated, "I'm not doing this" in reference to remaining on the [facility] board.   Interventions: Cognitive Behavioral Therapy. Clinician conducted session via caregility video from clinician's home office. Patient provided verbal consent to proceed with telehealth session and is aware of limitations of telephone or video visits. Patient participated in session from patient's home. Reviewed events since last session and assessed for changes. Discussed recent stressors and the impact on patient. Discussed patient's concerns related to psychotropic medication. Discussed patient following up with psychiatrist to address patient's concerns. Discussed patient's relationship with patient's mother and patient's concerns related to mother's cognitive functioning. Provided psycho education related to cognitive changes and assessment process related to cognitive changes. Provided resources for information related to cognitive concerns. Explored trigger for patient's return to the [facility] board.   Collaboration of Care: Other not required at this time   Diagnosis:  Anxiety disorder, unspecified type     Plan: Patient is to utilize [employer] Therapy, thought re-framing, relaxation techniques, opposite action, mindfulness and coping strategies to decrease symptoms associated with their  diagnosis. Frequency: bi-weekly  Modality: individual      Long-term goal:   Reduce overall level, frequency, and intensity of the feelings of anxiety as evidenced by decreased increased heart rate, shortness of breath, decreased energy, loss of interest from 7 days/week to 0 to 1 days/week per patient report for at least 3 consecutive months. Target Date: 06/26/24  Progress: progressing    Short-term goal:  Increase patient's awareness of patient's verbal and nonverbal communication with others and how patient's communication is perceived by others  Target Date: 06/26/24  Progress: progressing    Develop and implement healthy communication strategies for patient to utilize when expressing his thoughts and feelings to others    Target Date: 06/26/24  Progress: progressing    Develop and implement coping strategies for patient to utilize to decrease the negative impact chronic pain has on patient's ability to participate in family activities Target Date: 06/26/24  Progress: progressing    Maintain physical activity 3-5 days per week .  Target Date: 06/26/24  Progress: patient reported he feels this goal has been met    Increase patient's participation in activities patient enjoys, such as, Tai Chi, teaching opportunities, volunteer opportunities. Target Date: 06/26/24  Progress: progressing     Burlene Carpen, LCSW

## 2024-02-03 NOTE — Progress Notes (Signed)
   Ronald Barthel, LCSW

## 2024-02-21 ENCOUNTER — Encounter: Payer: Self-pay | Admitting: Cardiology

## 2024-02-21 DIAGNOSIS — E7800 Pure hypercholesterolemia, unspecified: Secondary | ICD-10-CM

## 2024-02-21 DIAGNOSIS — I214 Non-ST elevation (NSTEMI) myocardial infarction: Secondary | ICD-10-CM

## 2024-02-21 DIAGNOSIS — I2510 Atherosclerotic heart disease of native coronary artery without angina pectoris: Secondary | ICD-10-CM

## 2024-02-21 DIAGNOSIS — G894 Chronic pain syndrome: Secondary | ICD-10-CM | POA: Diagnosis not present

## 2024-02-21 DIAGNOSIS — E785 Hyperlipidemia, unspecified: Secondary | ICD-10-CM

## 2024-02-21 DIAGNOSIS — M79671 Pain in right foot: Secondary | ICD-10-CM | POA: Diagnosis not present

## 2024-02-21 DIAGNOSIS — G5723 Lesion of femoral nerve, bilateral lower limbs: Secondary | ICD-10-CM | POA: Diagnosis not present

## 2024-02-24 MED ORDER — ROSUVASTATIN CALCIUM 20 MG PO TABS: 20.0000 mg | ORAL_TABLET | Freq: Every day | ORAL | 3 refills | Status: DC

## 2024-02-24 NOTE — Telephone Encounter (Signed)
 Take a 1 month holiday off of the Lipitor -- we can change to rosuvastatin  20 mg   New Rx:  rosuvastatin  20 mg PO daily (take with dinner) - Disp # 90 tab; 3 refills.  DH

## 2024-02-28 DIAGNOSIS — G894 Chronic pain syndrome: Secondary | ICD-10-CM | POA: Diagnosis not present

## 2024-02-28 DIAGNOSIS — M961 Postlaminectomy syndrome, not elsewhere classified: Secondary | ICD-10-CM | POA: Diagnosis not present

## 2024-02-28 DIAGNOSIS — G5752 Tarsal tunnel syndrome, left lower limb: Secondary | ICD-10-CM | POA: Diagnosis not present

## 2024-02-28 DIAGNOSIS — Z79891 Long term (current) use of opiate analgesic: Secondary | ICD-10-CM | POA: Diagnosis not present

## 2024-03-05 ENCOUNTER — Ambulatory Visit: Admitting: Clinical

## 2024-03-07 ENCOUNTER — Ambulatory Visit: Admitting: Physician Assistant

## 2024-03-09 ENCOUNTER — Ambulatory Visit: Admitting: Clinical

## 2024-03-09 DIAGNOSIS — F419 Anxiety disorder, unspecified: Secondary | ICD-10-CM | POA: Diagnosis not present

## 2024-03-09 NOTE — Addendum Note (Signed)
 Addended by: Bebe Bourdon on: 03/09/2024 03:29 PM   Modules accepted: Orders

## 2024-03-09 NOTE — Progress Notes (Signed)
   Ronald Barthel, LCSW

## 2024-03-09 NOTE — Telephone Encounter (Signed)
 Per Dr Addie Holstein , referral to [facility] to discuss options for Lipid therapy  Order placed.

## 2024-03-09 NOTE — Progress Notes (Signed)
 [facility] Counselor/Therapist Progress Note  Patient ID: [name], MRN: [id number],    Date: 03/09/2024  Time Spent: 2:33pm - 3:36pm : 63 minutes   Treatment Type: Individual Therapy  Reported Symptoms: Patient reported not feeling well today  Mental Status Exam: Appearance:  Neat and Well Groomed     Behavior: Appropriate  Motor: Normal  Speech/Language:  Clear and Coherent and Normal Rate  Affect: Appropriate  Mood: Patient stated, I feel off kilter in response to current mood  Thought process: normal  Thought content:   WNL  Sensory/Perceptual disturbances:   Patient reported not feeling well today  Orientation: oriented to person, place, time/date, and situation  Attention: Fair  Concentration: Fair  Memory: WNL  Fund of knowledge:  Good  Insight:   Good  Judgment:  Good  Impulse Control: Good   Risk Assessment: Danger to Self:  No  Patient denied current suicidal ideation  Self-injurious Behavior: No Danger to Others: No Patient denied current homicidal ideation Duty to Warn:no Physical Aggression / Violence:No  Access to Firearms a concern: No  Gang Involvement:No   Subjective: Patient stated, not good news in response to events since last session. Patient reported a recent appointment with patient's podiatrist and was diagnosed with tarsal tunnel in both feet. Patient reported the podiatrist recommended surgery and patient was given steroid injections. Patient stated,  this is my life now what do I do. Patient stated, I don't feel well. Patient reported he believes the cause for his heart racing is due to statin medication. Patient reported he discontinued taking statin medication and has reached out to patient's cardiologist to inquire about alternative medications. Patient reported patient noted a difference in patient's heart racing when taking statin medication and when patient is not taking the medication. Patient reported he has  resigned from the [facility] (homeowner's association) twice in the last two weeks. Patient reported he decided, I can't do this anymore in reference to participation on the [facility] board. Patient stated, I feel I made the best choice for myself, I don't like it that I don't have input in the neighborhood in response to patient's resignation. Patient reported some decrease in anxiety in response to resignation. Patient stated, its not been a good day in response to patient's mood today. Patient stated, I don't tell people how it's (pain/physical limitations) affecting my life, I don't have a normal life, it (pain) affects every part of my life. Patient identified limitations as the key word in patient's description of pain/physical limitations to others. Patient reported feeling frustrated in response to patient's description of pain/physical limitations and discussing pain/physical limitations with others. Patient stated, I get scared for my future, I get scared for my wife.   Interventions: Cognitive Behavioral Therapy. Clinician conducted session via caregility video from clinician's home office. Patient provided verbal consent to proceed with telehealth session and is aware of limitations of telephone or video visits. Patient participated in session from patient's home. Reviewed events since last session and assessed for changes. Discussed recent appointment with podiatrist and patient's concerns related to medications. Discussed patient's decision to resign from [facility] board and explored patient's thoughts/feelings in response. Provided psycho education related to mindfulness exercise of attitude of acceptance and guided patient in practicing mindfulness exercise during today's session. Discussed changing frequency of sessions to monthly due to patient's financial concerns regarding treatment.    Collaboration of Care: Other not required at this time   Diagnosis:  Anxiety disorder, unspecified type  Plan: Patient is to utilize [employer] Therapy, thought re-framing, relaxation techniques, opposite action, mindfulness and coping strategies to decrease symptoms associated with their diagnosis. Frequency: monthly  Modality: individual      Long-term goal:   Reduce overall level, frequency, and intensity of the feelings of anxiety as evidenced by decreased increased heart rate, shortness of breath, decreased energy, loss of interest from 7 days/week to 0 to 1 days/week per patient report for at least 3 consecutive months. Target Date: 06/26/24  Progress: progressing    Short-term goal:  Increase patient's awareness of patient's verbal and nonverbal communication with others and how patient's communication is perceived by others  Target Date: 06/26/24  Progress: progressing    Develop and implement healthy communication strategies for patient to utilize when expressing his thoughts and feelings to others    Target Date: 06/26/24  Progress: progressing    Develop and implement coping strategies for patient to utilize to decrease the negative impact chronic pain has on patient's ability to participate in family activities Target Date: 06/26/24  Progress: progressing    Maintain physical activity 3-5 days per week .  Target Date: 06/26/24  Progress: patient reported he feels this goal has been met    Increase patient's participation in activities patient enjoys, such as, Tai Chi, teaching opportunities, volunteer opportunities. Target Date: 06/26/24  Progress: progressing       Burlene Carpen, LCSW

## 2024-04-01 ENCOUNTER — Ambulatory Visit (INDEPENDENT_AMBULATORY_CARE_PROVIDER_SITE_OTHER)

## 2024-04-01 ENCOUNTER — Ambulatory Visit
Admission: RE | Admit: 2024-04-01 | Discharge: 2024-04-01 | Disposition: A | Discharge status: Home / Self Care | Source: Ambulatory Visit

## 2024-04-01 VITALS — BP 136/89 | HR 99 | Temp 98.2°F | Resp 18

## 2024-04-01 DIAGNOSIS — M7731 Calcaneal spur, right foot: Secondary | ICD-10-CM | POA: Diagnosis not present

## 2024-04-01 DIAGNOSIS — M79671 Pain in right foot: Secondary | ICD-10-CM

## 2024-04-01 DIAGNOSIS — S92511A Displaced fracture of proximal phalanx of right lesser toe(s), initial encounter for closed fracture: Secondary | ICD-10-CM | POA: Diagnosis not present

## 2024-04-01 MED ORDER — DICLOFENAC SODIUM 50 MG PO TBEC: 50.0000 mg | DELAYED_RELEASE_TABLET | Freq: Two times a day (BID) | ORAL | 1 refills | Status: DC

## 2024-04-01 NOTE — ED Provider Notes (Signed)
 [facility]  Note:  This document was prepared using Conservation officer, historic buildings and may include unintentional dictation errors.  MRN: [id number] DOB: 07-18-1961  Subjective:   [name] is a 63 y.o. male presenting for Right fifth toe pain and swelling after accidentally hitting toe on ottoman on Friday night. Patient reports that his pain is 4 or 5/10.  Patient reports he is concerned that he may have broken his toe or or the bones of his foot.  Patient reports previous injuries to this foot and surgical intervention for Morton's neuroma on the same foot.  Patient has not taken any medication to treat symptoms.  Patient would like x-ray to make sure that there is no fracture.  No current facility-administered medications for this encounter.  Current Outpatient Medications:    diclofenac  (VOLTAREN ) 50 MG EC tablet, Take 1 tablet (50 mg total) by mouth 2 (two) times daily., Disp: 30 tablet, Rfl: 1   acyclovir  (ZOVIRAX ) 800 MG tablet, Take 1 tablet (800 mg total) by mouth 2 (two) times daily., Disp: 180 tablet, Rfl: 3   acyclovir  ointment (ZOVIRAX ) 5 %, APPLY 1 APPLICATION TOPICALLY EVERY 3 (THREE) HOURS., Disp: 15 g, Rfl: 3   amLODipine  (NORVASC ) 10 MG tablet, TAKE ONE TABLET (10 MG) BY MOUTH EVERY EVENING, Disp: 90 tablet, Rfl: 1   aspirin  EC 81 MG tablet, Take 1 tablet (81 mg total) by mouth daily. Swallow whole., Disp: 30 tablet, Rfl: 12   cetirizine (ZYRTEC) 10 MG tablet, Take 10 mg by mouth daily., Disp: , Rfl:    diazepam  (VALIUM ) 10 MG tablet, Take 10 mg by mouth 3 (three) times daily., Disp: , Rfl:    diclofenac  sodium (VOLTAREN ) 1 % GEL, Apply 1 application  topically daily as needed (For pain)., Disp: , Rfl:    EPINEPHrine  0.3 mg/0.3 mL IJ SOAJ injection, USE AS DIRECTED, Disp: 2 each, Rfl: 5   fentaNYL  (DURAGESIC  - DOSED MCG/HR) 50 MCG/HR, Place 50 mcg onto the skin every other day., Disp: , Rfl:    fluticasone  (FLONASE ) 50 MCG/ACT nasal spray, Place  2 sprays into both nostrils daily., Disp: 16 g, Rfl: 11   lactulose, encephalopathy, (CHRONULAC) 10 GM/15ML SOLN, Take 20 g by mouth at bedtime., Disp: , Rfl:    METHOCARBAMOL PO, Take by mouth as needed., Disp: , Rfl:    nitroGLYCERIN  (NITROSTAT ) 0.4 MG SL tablet, Place 1 tablet (0.4 mg total) under the tongue every 5 (five) minutes as needed for chest pain., Disp: 25 tablet, Rfl: 3   oxyCODONE  (OXY IR/ROXICODONE ) 5 MG immediate release tablet, Take 5 mg by mouth every 6 (six) hours as needed for moderate pain., Disp: , Rfl: 0   promethazine (PHENERGAN) 25 MG tablet, Take 12.5 mg by mouth every 6 (six) hours as needed for nausea or vomiting., Disp: , Rfl:    propranolol  (INDERAL ) 10 MG tablet, TAKE 1 TABLET BY MOUTH EVERYDAY AT BEDTIME (Patient not taking: Reported on 12/12/2023), Disp: 90 tablet, Rfl: 1   rosuvastatin  (CRESTOR ) 20 MG tablet, Take 1 tablet (20 mg total) by mouth daily., Disp: 90 tablet, Rfl: 3   scopolamine  (TRANSDERM SCOP , 1.5 MG,) 1 MG/3DAYS, Place 1 patch (1.5 mg total) onto the skin every 3 (three) days. (Patient not taking: Reported on 12/12/2023), Disp: 10 patch, Rfl: 0   traZODone  (DESYREL ) 100 MG tablet, Take 50 mg by mouth at bedtime., Disp: , Rfl:    triamcinolone  cream (KENALOG ) 0.1 %, Apply 1 application topically 2 (two) times daily.,  Disp: 30 g, Rfl: 0   Allergies  Allergen Reactions   Fire Ant Anaphylaxis    Has Epi pen Has Epi pen    Metoprolol  Succinate [Metoprolol ] Shortness Of Breath    Fatigue and constipation   Codeine     REACTION: ITCHING   Cyclobenzaprine Other (See Comments)    Nightmares   Docusate Sodium     swelling   Dulcolax Stool Softener [Dss]     swelling   Gabapentin      Migraine  06/14/23 Patient states medication causes confusion as well   Ketorolac     Other reaction(s): Vomiting (intolerance)   Ketorolac Tromethamine Other (See Comments)    REACTION: SEVERE VOMMITING   Lisinopril      Cough & chest tightness   Methadone Other  (See Comments)    Loss of appetite   Pregabalin      headaches Other reaction(s): Confusion (intolerance)   Statins Other (See Comments)    Tried Atorvastatin   and Rosuvastatin   per patient description  the heart problems that I've been experiencing for over a year and a half in the morning disappeared after not taking Atorvastatin  for a few days 02/2024    Past Medical History:  Diagnosis Date   ADHD (attention deficit hyperactivity disorder), inattentive type    sees Dr. Chyrl Na in [place], [place]    Anxiety    sees Dr. Chyrl Na in [place], [place]    Arthritis    Benign enlargement of prostate    sees [facility]    CAD- 1 Vessel, DES PCI LCx. (NSTEMI) 12/10/2022   LHC 12/10/2022:  Minimal LM & LAD disease. Mid Cx 99% (subtotal occlusion) =>  PCI using a 2.25 x 24 mm Synergy DES; pRCA 30% & mRCA 50%.      Carpal tunnel syndrome on left    sees Dr. Recardo at [facility]    Chronic pain    sees Dr. Oneil Ellen (meds) and Rosaline Lan (accupuncture)    Depression    sees Dr. Chyrl Na   Functional impotence    Hyperlipidemia with target LDL less than 70 07/09/2014   Hypogonadism in male    sees Clotilda (a [place]) at [facility]    Insomnia    Kidney stones    NSTEMI (non-ST elevated myocardial infarction) (HCC) 12/10/2022   Admitted with stuttering chest pain, troponin elevation. => 9 9% LCx=> DES stent.  Normal EF on echo.   Sinusitis, acute 07/17/2016   Urticaria due to cold      Past Surgical History:  Procedure Laterality Date   APPENDECTOMY     CERVICAL FUSION  2008   C6-7 per Dr. Royden Schneider   COLONOSCOPY  01/08/2021   per Dr. Rosalie, no polyps, repeat in 10 yrs   CORONARY STENT INTERVENTION N/A 12/10/2022   Procedure: CORONARY STENT INTERVENTION;  Surgeon: Wonda Sharper, MD;  Location: [facility] INVASIVE CV LAB;  Service: Cardiovascular; Mid LCx 99 %=> 0%: 2.25 x 24 Synergy DES.   FOOT SURGERY Right    x 2   LEFT HEART CATH AND CORONARY ANGIOGRAPHY  N/A 12/10/2022   Procedure: LEFT HEART CATH AND CORONARY ANGIOGRAPHY;  Surgeon: Wonda Sharper, MD;  Location: [facility] INVASIVE CV LAB;  Service: CV:: NSTEMI: Minimal LM & LAD disease. Mid Cx 99% (subtotal occlusion) => DES PCI; pRCA 30% & mRCA 50%.   LUMBAR DISC SURGERY     L5-S1, C 6-6   TRANSTHORACIC ECHOCARDIOGRAM  12/11/2022   ( non-STEMI) normal LV size  and function.  EF 60 to 65%.  No RWMA.  GR 1 DD.  Normal RV size and function.  Normal PAP and RAP.  Normal valves.--Normal echo.    Family History  Problem Relation Age of Onset   Heart disease Other    Diabetes Other    Hyperlipidemia Other    Hypertension Sister    Breast cancer Sister    Heart disease Father    Diabetes Father    Heart attack Father        x 2   Prostate cancer Neg Hx    Kidney disease Neg Hx     Social History   Tobacco Use   Smoking status: Former   Smokeless tobacco: Never   Tobacco comments:    quit 35 years  Substance Use Topics   Alcohol use: Yes    Alcohol/week: 0.0 standard drinks of alcohol    Comment: occasional   Drug use: No    ROS Refer to HPI for ROS details.  Objective:   Vitals: BP 136/89 (BP Location: Right Arm)   Pulse 99   Temp 98.2 F (36.8 C) (Oral)   Resp 18   SpO2 98%   Physical Exam Vitals and nursing note reviewed.  Constitutional:      General: He is not in acute distress.    Appearance: Normal appearance. He is not ill-appearing or toxic-appearing.  HENT:     Head: Normocephalic.  Cardiovascular:     Rate and Rhythm: Normal rate.  Pulmonary:     Effort: Pulmonary effort is normal. No respiratory distress.  Musculoskeletal:     Right foot: Decreased range of motion. Normal capillary refill. Swelling, tenderness and bony tenderness present. No deformity. Normal pulse.  Skin:    General: Skin is warm and dry.     Capillary Refill: Capillary refill takes less than 2 seconds.  Neurological:     General: No focal deficit present.     Mental Status: He is  alert and oriented to person, place, and time.  Psychiatric:        Mood and Affect: Mood normal.        Behavior: Behavior normal.     Procedures  No results found for this or any previous visit (from the past 24 hours).  DG Foot Complete Right Result Date: 04/01/2024 CLINICAL DATA:  Right foot pain EXAM: RIGHT FOOT COMPLETE - 3+ VIEW COMPARISON:  08/15/2016 FINDINGS: Transverse fracture across the base of the the proximal phalanx right little toe, without definite intra-articular involvement. Less than 2 mm distraction, minimal lateral angulation of distal fracture fragment. Otherwise normal mineralization alignment. Calcaneal spurs. IMPRESSION: Minimally displaced fracture, base of proximal phalanx right little toe. Electronically Signed   By: JONETTA Faes M.D.   On: 04/01/2024 14:57     Assessment and Plan :     Discharge Instructions       1. Closed displaced fracture of proximal phalanx of lesser toe of right foot, initial encounter (Primary) - DG Foot Complete Right x-ray performed in UC shows partially displaced proximal phalanx fracture of the fifth toe, no other fracture or dislocation noted. - Apply ace wrap in UC for compression and protection - Apply Post op shoe in UC for protection and immobilization - diclofenac  (VOLTAREN ) 50 MG EC tablet; Take 1 tablet (50 mg total) by mouth 2 (two) times daily.  Dispense: 30 tablet; Refill: 1 - AMB referral to orthopedics for follow-up evaluation of right fifth toe fracture.  Mataeo Ingwersen B Vernette Moise   [place] Kiny[place], Zaleski B, [place] 04/01/24 1529

## 2024-04-01 NOTE — Discharge Instructions (Signed)
  1. Closed displaced fracture of proximal phalanx of lesser toe of right foot, initial encounter (Primary) - DG Foot Complete Right x-ray performed in UC shows partially displaced proximal phalanx fracture of the fifth toe, no other fracture or dislocation noted. - Apply ace wrap in UC for compression and protection - Apply Post op shoe in UC for protection and immobilization - diclofenac  (VOLTAREN ) 50 MG EC tablet; Take 1 tablet (50 mg total) by mouth 2 (two) times daily.  Dispense: 30 tablet; Refill: 1 - AMB referral to orthopedics for follow-up evaluation of right fifth toe fracture.

## 2024-04-01 NOTE — ED Triage Notes (Signed)
 Patient complains right pinky toe pain and swelling. Patient kicked ottoman on Friday night. Rate 4/10.

## 2024-04-02 ENCOUNTER — Ambulatory Visit: Admitting: Clinical

## 2024-04-10 DIAGNOSIS — S92514A Nondisplaced fracture of proximal phalanx of right lesser toe(s), initial encounter for closed fracture: Secondary | ICD-10-CM | POA: Diagnosis not present

## 2024-04-16 DIAGNOSIS — F322 Major depressive disorder, single episode, severe without psychotic features: Secondary | ICD-10-CM | POA: Diagnosis not present

## 2024-04-24 DIAGNOSIS — G5752 Tarsal tunnel syndrome, left lower limb: Secondary | ICD-10-CM | POA: Diagnosis not present

## 2024-04-24 DIAGNOSIS — M79674 Pain in right toe(s): Secondary | ICD-10-CM | POA: Diagnosis not present

## 2024-04-24 DIAGNOSIS — Z79891 Long term (current) use of opiate analgesic: Secondary | ICD-10-CM | POA: Diagnosis not present

## 2024-04-24 DIAGNOSIS — G894 Chronic pain syndrome: Secondary | ICD-10-CM | POA: Diagnosis not present

## 2024-04-25 ENCOUNTER — Encounter: Payer: Self-pay | Admitting: Pharmacist Clinician (PhC)/ Clinical Pharmacy Specialist

## 2024-04-25 ENCOUNTER — Ambulatory Visit: Attending: Cardiology | Admitting: Pharmacist Clinician (PhC)/ Clinical Pharmacy Specialist

## 2024-04-25 DIAGNOSIS — E785 Hyperlipidemia, unspecified: Secondary | ICD-10-CM

## 2024-04-25 NOTE — Patient Instructions (Signed)
 Your Results:             Your most recent labs Goal  Total Cholesterol 148 < 200  Triglycerides 72 < 150  HDL (happy/good cholesterol) 54.2 > 40  LDL (lousy/bad cholesterol 79 < 70   Go to the lab in the next few days to get updated cholesterol labs.    Once we have those labs, we can determine which dose of atorvastatin  to re-challenge with.    Thank you for choosing [facility]

## 2024-04-25 NOTE — Progress Notes (Signed)
 Office Visit    Patient Name: [name] Date of Encounter: 04/25/2024  Primary Care Provider:  Johnny Garnette LABOR, MD Primary Cardiologist:  Alm Clay, MD  Chief Complaint    Hyperlipidemia   Significant Past Medical History   preDM 12/24 A1c 5.9  CAD 3/24 NSTEMI, DES to LCx  HTN Mostly controlled, some diastolic elevation, on amlodipine      Allergies  Allergen Reactions   Fire Ant Anaphylaxis    Has Epi pen Has Epi pen    Metoprolol  Succinate [Metoprolol ] Shortness Of Breath    Fatigue and constipation   Codeine     REACTION: ITCHING   Cyclobenzaprine Other (See Comments)    Nightmares   Docusate Sodium     swelling   Dulcolax Stool Softener [Dss]     swelling   Gabapentin      Migraine  06/14/23 Patient states medication causes confusion as well   Ketorolac     Other reaction(s): Vomiting (intolerance)   Ketorolac Tromethamine Other (See Comments)    REACTION: SEVERE VOMMITING   Lisinopril      Cough & chest tightness   Methadone Other (See Comments)    Loss of appetite   Pregabalin      headaches Other reaction(s): Confusion (intolerance)   Statins Other (See Comments)    Tried Atorvastatin   and Rosuvastatin   per patient description  the heart problems that I've been experiencing for over a year and a half in the morning disappeared after not taking Atorvastatin  for a few days 02/2024    History of Present Illness    [name] is a 63 y.o. male patient of Dr Clay, in the office today to discuss options for cholesterol management.  After MI, felt like horses were tap dancing on is heart, felt somewhat better after stopping brilinta .  RForgot statin on vacation, and felt better immediately.   Tied 3 different times to stop/start and felt the same.  Tried rosuvastatin  and felt worse.    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  Pharmacy:   [facility]  Healthwell:      LDL Cholesterol goal:  LDL < 70  Current Medications:      Previously tried:  atorvastatin , rosuvastatin    Family Hx:   father had MI at 48, second in his 57's, maternal gf had MI at 80; mother living at 32; sister with high cholesterol, on Repatha;   Social Hx: Tobacco: no Alcohol: 1 beer or wine most days     Diet:   80% home cooked, tries to be good when eating out; protein is mostly chicken and fish; lots of vegetables, mostly fresh; beans; snacking not common - fruit smoothies   Exercise: every other day, 30 min on elliptical bike; opposite dies does 15 min weight exercise and tai chi  Accessory Clinical Findings   Lab Results  Component Value Date   CHOL 148 09/01/2023   HDL 54.20 09/01/2023   LDLCALC 79 09/01/2023   LDLDIRECT 179.9 12/18/2012   TRIG 72.0 09/01/2023   CHOLHDL 3 09/01/2023    Lipoprotein (a)  Date/Time Value Ref Range Status  12/11/2022 01:13 AM 32.9 (H) <75.0 nmol/L Final    Comment:    (NOTE) Note:  Values greater than or equal to 75.0 nmol/L may       indicate an independent risk factor for CHD,       but must be evaluated with caution when applied       to non-Caucasian populations due  to the       influence of genetic factors on Lp(a) across       ethnicities. Performed At: [facility] [address] [place], [place] [id number] Jennette Shorter MD Ey:[id number]     Lab Results  Component Value Date   ALT 40 09/01/2023   AST 32 09/01/2023   ALKPHOS 72 09/01/2023   BILITOT 0.8 09/01/2023   Lab Results  Component Value Date   CREATININE 0.97 09/01/2023   BUN 15 09/01/2023   NA 136 09/01/2023   K 4.0 09/01/2023   CL 102 09/01/2023   CO2 25 09/01/2023   Lab Results  Component Value Date   HGBA1C 5.9 09/01/2023    Home Medications    Current Outpatient Medications  Medication Sig Dispense Refill   acyclovir  (ZOVIRAX ) 800 MG tablet Take 1 tablet (800 mg total) by mouth 2 (two) times daily. 180 tablet 3   acyclovir  ointment (ZOVIRAX ) 5 % APPLY 1 APPLICATION TOPICALLY EVERY 3  (THREE) HOURS. 15 g 3   amLODipine  (NORVASC ) 10 MG tablet TAKE ONE TABLET (10 MG) BY MOUTH EVERY EVENING 90 tablet 1   aspirin  EC 81 MG tablet Take 1 tablet (81 mg total) by mouth daily. Swallow whole. 30 tablet 12   cetirizine (ZYRTEC) 10 MG tablet Take 10 mg by mouth daily.     diazepam  (VALIUM ) 10 MG tablet Take 10 mg by mouth 3 (three) times daily.     diclofenac  (VOLTAREN ) 50 MG EC tablet Take 1 tablet (50 mg total) by mouth 2 (two) times daily. 30 tablet 1   diclofenac  sodium (VOLTAREN ) 1 % GEL Apply 1 application  topically daily as needed (For pain).     EPINEPHrine  0.3 mg/0.3 mL IJ SOAJ injection USE AS DIRECTED 2 each 5   fentaNYL  (DURAGESIC  - DOSED MCG/HR) 50 MCG/HR Place 50 mcg onto the skin every other day.     fluticasone  (FLONASE ) 50 MCG/ACT nasal spray Place 2 sprays into both nostrils daily. 16 g 11   lactulose, encephalopathy, (CHRONULAC) 10 GM/15ML SOLN Take 20 g by mouth at bedtime.     METHOCARBAMOL PO Take by mouth as needed.     nitroGLYCERIN  (NITROSTAT ) 0.4 MG SL tablet Place 1 tablet (0.4 mg total) under the tongue every 5 (five) minutes as needed for chest pain. 25 tablet 3   oxyCODONE  (OXY IR/ROXICODONE ) 5 MG immediate release tablet Take 5 mg by mouth every 6 (six) hours as needed for moderate pain.  0   promethazine (PHENERGAN) 25 MG tablet Take 12.5 mg by mouth every 6 (six) hours as needed for nausea or vomiting.     propranolol  (INDERAL ) 10 MG tablet TAKE 1 TABLET BY MOUTH EVERYDAY AT BEDTIME (Patient not taking: Reported on 12/12/2023) 90 tablet 1   rosuvastatin  (CRESTOR ) 20 MG tablet Take 1 tablet (20 mg total) by mouth daily. 90 tablet 3   scopolamine  (TRANSDERM SCOP , 1.5 MG,) 1 MG/3DAYS Place 1 patch (1.5 mg total) onto the skin every 3 (three) days. (Patient not taking: Reported on 12/12/2023) 10 patch 0   traZODone  (DESYREL ) 100 MG tablet Take 50 mg by mouth at bedtime.     triamcinolone  cream (KENALOG ) 0.1 % Apply 1 application topically 2 (two) times daily. 30  g 0   No current facility-administered medications for this visit.     Assessment & Plan    Hyperlipidemia with target LDL less than 70 Assessment: Patient with ASCVD not at LDL goal of < 70 Most recent LDL  79 on 09/01/2023 Not able to tolerate statins secondary to myalgias - rosuvastatin  Stopped atorvastatin  2/2 chest pounding horses tap dancing on my chest - eased when stopped Brilinta , but still had some symptoms, thought were related to this.   Reviewed options for lowering LDL cholesterol, including ezetimibe, PCSK-9 inhibitors, bempedoic acid and inclisiran.  Discussed mechanisms of action, dosing, side effects, potential decreases in LDL cholesterol and costs.  Also reviewed potential options for patient assistance.  Plan: Patient agreeable getting updated labs before a decision is made Will consider lower dose of atorvastatin  (40 mg) - pt is willing to re-challenge now that strange symptoms are gone If not able to tolerate atorvastatin , would like to try Livalo, was given info by PCP and would rather try before going to injectable.    Benjamim Harnish, PharmD CPP [facility] [address]   [place], [place] [address] [phone]  04/25/2024, 4:41 PM

## 2024-04-25 NOTE — Assessment & Plan Note (Signed)
 Assessment: Patient with ASCVD not at LDL goal of < 70 Most recent LDL 79 on 09/01/2023 Not able to tolerate statins secondary to myalgias - rosuvastatin  Stopped atorvastatin  2/2 chest pounding horses tap dancing on my chest - eased when stopped Brilinta , but still had some symptoms, thought were related to this.   Reviewed options for lowering LDL cholesterol, including ezetimibe, PCSK-9 inhibitors, bempedoic acid and inclisiran.  Discussed mechanisms of action, dosing, side effects, potential decreases in LDL cholesterol and costs.  Also reviewed potential options for patient assistance.  Plan: Patient agreeable getting updated labs before a decision is made Will consider lower dose of atorvastatin  (40 mg) - pt is willing to re-challenge now that strange symptoms are gone If not able to tolerate atorvastatin , would like to try Livalo, was given info by PCP and would rather try before going to injectable.

## 2024-04-27 ENCOUNTER — Ambulatory Visit: Admitting: Clinical

## 2024-04-27 DIAGNOSIS — F419 Anxiety disorder, unspecified: Secondary | ICD-10-CM | POA: Diagnosis not present

## 2024-04-27 NOTE — Progress Notes (Signed)
 [facility] Counselor/Therapist Progress Note  Patient ID: [name], MRN: [id number],    Date: 04/27/2024  Time Spent: 2:33pm - 3:34pm : 61 minutes  Treatment Type: Individual Therapy  Reported Symptoms: difficulty sleeping, waking up at night with heart racing and difficulty returning to sleep.   Mental Status Exam: Appearance:  Neat and Well Groomed     Behavior: Appropriate  Motor: Normal  Speech/Language:  Clear and Coherent and Normal Rate  Affect: Appropriate  Mood: normal  Thought process: normal  Thought content:   WNL  Sensory/Perceptual disturbances:   WNL  Orientation: oriented to person, place, time/date, and situation  Attention: Good  Concentration: Good  Memory: WNL  Fund of knowledge:  Good  Insight:   Good  Judgment:  Good  Impulse Control: Good   Risk Assessment: Danger to Self:  No Patient denied current suicidal ideation  Self-injurious Behavior: No Danger to Others: No Patient denied current homicidal ideation Duty to Warn:no Physical Aggression / Violence:No  Access to Firearms a concern: No  Gang Involvement:No   Subjective: Patient stated, I broke my toe again in response to events since last session. Patient stated, because my feet are numb, its hard to judge pain at times.  Patient reported recent appointment with current psychiatrist and reported I've got to find another psychiatrist. Patient reported distance/location of current psychiatrist is a barrier to continuing treatment. Patient stated, I guess the same, nothing much has changed in response to patient's mood since last session. Patient stated, I try to put things in a better perspective. Patient reported recent conflict with mother and stated, she is being combative, she is being forgetful, she has been very difficult,  I am like the maintenance person in her life. Patient reported I hung up on her two days ago and reported she (mother) tries to  fix things that were unfixable in reference to patient's physical health. Patient reported patient's mother is not open to an evaluation to assess for changes patient has observed in mother. Patient reported feelings of frustration in response to mother's requests. Patient reported patient utilized a Catering manager yesterday.   Interventions: Dialectical Behavioral Therapy. Clinician conducted session via caregility video from clinician's home office. Patient provided verbal consent to proceed with telehealth session and is aware of limitations of telephone or video visits. Patient participated in session from patient's home. Reviewed events since last session and assessed for changes. Discussed barriers to continuing treatment with current psychiatrist and provided resources for psychiatrists in the [place] and [place] areas. Discussed patient's concerns related to sleep and discussed recommendation of CBTI. Processed recent conflict with patient's mother and patient's response. Explored and identified emotions and thoughts triggered by conflict with mother. Provided psycho education related to generating alternatives. Discussed coping strategies patient has utilized in response to feelings of frustration.    Collaboration of Care: Other not required at this time   Diagnosis:  Anxiety disorder, unspecified type     Plan: Patient is to utilize [employer] Therapy, thought re-framing, relaxation techniques, opposite action, mindfulness and coping strategies to decrease symptoms associated with their diagnosis. Frequency: monthly  Modality: individual      Long-term goal:   Reduce overall level, frequency, and intensity of the feelings of anxiety as evidenced by decreased increased heart rate, shortness of breath, decreased energy, loss of interest from 7 days/week to 0 to 1 days/week per patient report for at least 3 consecutive months. Target Date: 06/26/24  Progress:  progressing  Short-term goal:  Increase patient's awareness of patient's verbal and nonverbal communication with others and how patient's communication is perceived by others  Target Date: 06/26/24  Progress: progressing    Develop and implement healthy communication strategies for patient to utilize when expressing his thoughts and feelings to others    Target Date: 06/26/24  Progress: progressing    Develop and implement coping strategies for patient to utilize to decrease the negative impact chronic pain has on patient's ability to participate in family activities Target Date: 06/26/24  Progress: progressing    Maintain physical activity 3-5 days per week .  Target Date: 06/26/24  Progress: patient reported he feels this goal has been met    Increase patient's participation in activities patient enjoys, such as, Tai Chi, teaching opportunities, volunteer opportunities. Target Date: 06/26/24  Progress: progressing         Darice Seats, LCSW

## 2024-04-27 NOTE — Progress Notes (Signed)
   Ronald Barthel, LCSW

## 2024-05-01 DIAGNOSIS — E785 Hyperlipidemia, unspecified: Secondary | ICD-10-CM | POA: Diagnosis not present

## 2024-05-02 ENCOUNTER — Ambulatory Visit: Payer: Self-pay | Admitting: Pharmacist Clinician (PhC)/ Clinical Pharmacy Specialist

## 2024-05-02 LAB — LIPID PANEL
Chol/HDL Ratio: 4.3 ratio (ref 0.0–5.0)
Cholesterol, Total: 236 mg/dL — ABNORMAL HIGH (ref 100–199)
HDL: 55 mg/dL (ref >39)
LDL Chol Calc (NIH): 165 mg/dL — ABNORMAL HIGH (ref 0–99)
Triglycerides: 90 mg/dL (ref 0–149)
VLDL Cholesterol Cal: 16 mg/dL (ref 5–40)

## 2024-05-02 LAB — HEPATIC FUNCTION PANEL
ALT: 59 IU/L — ABNORMAL HIGH (ref 0–44)
AST: 43 IU/L — ABNORMAL HIGH (ref 0–40)
Albumin: 4.5 g/dL (ref 3.9–4.9)
Alkaline Phosphatase: 68 IU/L (ref 44–121)
Bilirubin Total: 0.5 mg/dL (ref 0.0–1.2)
Bilirubin, Direct: 0.19 mg/dL (ref 0.00–0.40)
Total Protein: 7.1 g/dL (ref 6.0–8.5)

## 2024-05-02 MED ORDER — ATORVASTATIN CALCIUM 40 MG PO TABS: 40.0000 mg | ORAL_TABLET | Freq: Every day | ORAL | 6 refills | Status: AC

## 2024-05-11 NOTE — Progress Notes (Signed)
   05/11/2024  Patient ID: [name], male   DOB: 23-Sep-1961, 63 y.o.   MRN: [id number]  Pharmacy Quality Measure Review  This patient is appearing on a report for being at risk of failing the adherence measure for cholesterol (statin) medications this calendar year.   Medication: Rosuvastatin   Last fill date: 02/24/24 for 90 day supply  Medication: Atorvastatin  Last fill date: 05/02/24 for 90 day supply  Insurance report was not up to date. No action needed at this time.  Rosuvastatin  stopped and replaced by atorvastatin .  [name], PharmD Clinical Pharmacist [phone]

## 2024-05-25 DIAGNOSIS — H25041 Posterior subcapsular polar age-related cataract, right eye: Secondary | ICD-10-CM | POA: Diagnosis not present

## 2024-05-25 DIAGNOSIS — H5789 Other specified disorders of eye and adnexa: Secondary | ICD-10-CM | POA: Diagnosis not present

## 2024-05-25 DIAGNOSIS — H2513 Age-related nuclear cataract, bilateral: Secondary | ICD-10-CM | POA: Diagnosis not present

## 2024-06-01 ENCOUNTER — Ambulatory Visit: Admitting: Clinical

## 2024-06-01 ENCOUNTER — Encounter

## 2024-06-01 DIAGNOSIS — F419 Anxiety disorder, unspecified: Secondary | ICD-10-CM

## 2024-06-01 NOTE — Progress Notes (Signed)
 [facility] Counselor/Therapist Progress Note  Patient ID: [name], MRN: [id number],    Date: 06/01/2024  Time Spent: 2:35pm - 3:30pm : 55 minutes   Treatment Type: Individual Therapy  Reported Symptoms: feels heart racing at times  Mental Status Exam: Appearance:  Neat and Well Groomed     Behavior: Appropriate  Motor: Normal  Speech/Language:  Clear and Coherent and Normal Rate  Affect: Appropriate  Mood: normal  Thought process: normal  Thought content:   WNL  Sensory/Perceptual disturbances:   WNL  Orientation: oriented to person, place, time/date, and situation  Attention: Good  Concentration: Good  Memory: WNL  Fund of knowledge:  Good  Insight:   Good  Judgment:  Good  Impulse Control: Good   Risk Assessment: Danger to Self:  No Patient denied current suicidal ideation  Self-injurious Behavior: No Danger to Others: No Patient denied current homicidal ideation Duty to Warn:no Physical Aggression / Violence:No  Access to Firearms a concern: No  Gang Involvement:No   Subjective: Patient stated, very stressful in response to events since last session. Patient reported patient has been waiting for a door for patient's home for 8 months.  Patient stated, everything's effecting me right now, I've still got that thing with my heart. Patient reported patient feels his heart is racing at times. Patient reported increased stress related to status of wife's health. Patient reported patient is suppose to go to the beach Sunday and does not want to go to the beach. Patient reported patient prefers mountains, will need to take dog out and down 6th floors while at the beach. Patient stated, its a lot for me at this time. Patient stated,  I'm tired of always being the one that gives in. Patient stated, my feet don't hurt when my feet hit the sand in response to positive aspects related to the beach. Patient reported patient enjoys being in the surf,  enjoys sitting on the beach for a short duration of time. Patient stated, I would really like to know what's going on with my heart.  Patient stated, I'll give it a whirl in response to grounding exercises.    Interventions: Cognitive Behavioral Therapy. Clinician conducted session via caregility video from clinician's home office. Patient provided verbal consent to proceed with telehealth session and is aware of limitations of telephone or video visits. Patient participated in session from patient's home. Reviewed events since last session and assessed for changes. Discussed current stressors and explored the impact on patient's mood. Provided supportive therapy and active listening as patient discussed stress related to home repairs. Assisted patient in exploring and identifying positives aspects related to the beach. Provided psycho related to grounding exercises. Provided psycho education related to anxiety and relaxation techniques.    Collaboration of Care: Other not required at this time   Diagnosis:  Anxiety disorder, unspecified type     Plan: Patient is to utilize [employer] Therapy, thought re-framing, relaxation techniques, opposite action, mindfulness and coping strategies to decrease symptoms associated with their diagnosis. Frequency: monthly  Modality: individual      Long-term goal:   Reduce overall level, frequency, and intensity of the feelings of anxiety as evidenced by decreased increased heart rate, shortness of breath, decreased energy, loss of interest from 7 days/week to 0 to 1 days/week per patient report for at least 3 consecutive months. Target Date: 06/26/24  Progress: progressing    Short-term goal:  Increase patient's awareness of patient's verbal and nonverbal communication with others  and how patient's communication is perceived by others  Target Date: 06/26/24  Progress: progressing    Develop and implement healthy communication strategies for  patient to utilize when expressing his thoughts and feelings to others    Target Date: 06/26/24  Progress: progressing    Develop and implement coping strategies for patient to utilize to decrease the negative impact chronic pain has on patient's ability to participate in family activities Target Date: 06/26/24  Progress: progressing    Maintain physical activity 3-5 days per week .  Target Date: 06/26/24  Progress: patient reported he feels this goal has been met    Increase patient's participation in activities patient enjoys, such as, Tai Chi, teaching opportunities, volunteer opportunities. Target Date: 06/26/24  Progress: progressing         Darice Seats, LCSW

## 2024-06-01 NOTE — Progress Notes (Signed)
   Darice Seats, LCSW

## 2024-06-11 DIAGNOSIS — H5789 Other specified disorders of eye and adnexa: Secondary | ICD-10-CM | POA: Diagnosis not present

## 2024-06-11 DIAGNOSIS — H11121 Conjunctival concretions, right eye: Secondary | ICD-10-CM | POA: Diagnosis not present

## 2024-06-16 ENCOUNTER — Other Ambulatory Visit: Payer: Self-pay | Admitting: Family Medicine

## 2024-06-18 ENCOUNTER — Encounter: Payer: Self-pay | Admitting: Pharmacist Clinician (PhC)/ Clinical Pharmacy Specialist

## 2024-06-19 DIAGNOSIS — Z79891 Long term (current) use of opiate analgesic: Secondary | ICD-10-CM | POA: Diagnosis not present

## 2024-06-19 DIAGNOSIS — G894 Chronic pain syndrome: Secondary | ICD-10-CM | POA: Diagnosis not present

## 2024-06-19 DIAGNOSIS — M79674 Pain in right toe(s): Secondary | ICD-10-CM | POA: Diagnosis not present

## 2024-06-19 DIAGNOSIS — G5752 Tarsal tunnel syndrome, left lower limb: Secondary | ICD-10-CM | POA: Diagnosis not present

## 2024-07-02 ENCOUNTER — Encounter: Payer: Self-pay | Admitting: Clinical

## 2024-07-02 ENCOUNTER — Ambulatory Visit: Admitting: Clinical

## 2024-07-02 ENCOUNTER — Telehealth: Payer: Self-pay | Admitting: Cardiology

## 2024-07-02 DIAGNOSIS — F419 Anxiety disorder, unspecified: Secondary | ICD-10-CM

## 2024-07-02 NOTE — Telephone Encounter (Signed)
 Pt c/o of Chest Pain: STAT if active (IN THIS MOMENT) CP, including tightness, pressure, jaw pain, shoulder/upper arm/back pain, SOB, nausea, and vomiting.  1. Are you having CP right now (tightness, pressure, or discomfort)?  Yes, on left side  2. Are you experiencing any other symptoms (ex. SOB, nausea, vomiting, sweating)?  No   3. How long have you been experiencing CP?  Past 3 days  4. Is your CP continuous or coming and going?  Coming and going   5. Have you taken Nitroglycerin ?  No, patient would like to know when he should take it.

## 2024-07-02 NOTE — Progress Notes (Signed)
 [facility] Counselor/Therapist Progress Note  Patient ID: KENYADA HY, MRN: [id number],    Date: 07/02/2024  Time Spent: 1:34pm - 2:33pm : 59 minutes   Treatment Type: Individual Therapy  Reported Symptoms: anxiety, heart racing  Mental Status Exam: Appearance:  Neat and Well Groomed     Behavior: Appropriate  Motor: Normal  Speech/Language:  Clear and Coherent and Normal Rate  Affect: Appropriate  Mood: normal  Thought process: normal  Thought content:   WNL  Sensory/Perceptual disturbances:   WNL  Orientation: oriented to person, place, time/date, and situation  Attention: Good  Concentration: Good  Memory: WNL  Fund of knowledge:  Good  Insight:   Good  Judgment:  Good  Impulse Control: Good   Risk Assessment: Danger to Self:  No Patient denied current suicidal ideation  Self-injurious Behavior: No Danger to Others: No Patient denied current homicidal ideation Duty to Warn:no Physical Aggression / Violence:No  Access to Firearms a concern: No  Gang Involvement:No   Subjective: Patient stated, I had it out with one of the board members. 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  Patient stated, I'm tired of people doing things they shouldn't do. Patient stated, its just too much for me. Patient reported patient inquiring about a board member's role triggered conflict with board member. Patient reported patient's aunt passed away several weeks ago. Patient stated, not great in response to patient's mood. Patient stated, literally as things are right now I can't plan one day to the next and reported waking up this morning with my heart pounding. Patient stated, It happens to me every morning in reference to heart racing. Patient stated, nothing, I was asleep in response to thoughts associated with symptoms and reported feeling of heart racing occurs when patient wakes up or wakes  patient out of sleep. Patient reported plans to discuss symptoms with cardiologist during upcoming appointment. Patient stated, I'm concerned in reference to heart racing. Patient stated, the thoughts start racing as soon as my eyes open and reported thought what in the world is causing these issues. Patient stated, it doesn't calm me down but does allow me to be in the present in reference to grounding exercises. Patient stated, I just feel bad about it in reference to the impact of patient's physical health on patient's wife. Patient stated, I'm good as far as I know in reference to current mood. Patient stated, I don't know how to answer it and reported patient would like to re-evaluate progress towards long term goal at a later date due to recent change in stressor. Patient stated, I've done very well with that, I try to catch myself if I'm doing something incorrect and I try to apologize in response to patient's first short term goal. Patient stated, if I can just get myself to take a moment and say wait a second in reference to responding to others. Patient stated, 80% in reference to progress towards patient's second short term goal. Patient stated, that's a difficult one in reference to patient's third short term goal. Patient stated, pretty good, always room for improvement in response to patient's fourth short term goal. Patient stated, I have not accomplished that in response to patient's fifth short term goal.   Interventions: Cognitive Behavioral Therapy. Clinician conducted session via caregility video from clinician's home office. Patient provided verbal consent to proceed with telehealth session and is aware of limitations of telephone or video visits. Patient  participated in session from patient's home. Reviewed events since last session and assessed for changes. Discussed recent conflict and trigger for conflict. 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 Provided supportive therapy, active listening, validation as patient discussed the loss of patient's aunt. Explored thoughts associated with heart racing. Provided psycho education related to connection between thoughts, emotions, behaviors, and cognitive restructuring. Reviewed grounding exercises and imagery and the outcome. Reviewed patient's goals and progress towards goals. Clinician requested for homework patient complete thought record.   Collaboration of Care: Other not required at this time   Diagnosis:  Anxiety disorder, unspecified type     Plan: Patient is to utilize [employer], thought re-framing, relaxation techniques, opposite action, mindfulness and coping strategies to decrease symptoms associated with their diagnosis. Frequency: monthly  Modality: individual      Long-term goal:   Reduce overall level, frequency, and intensity of the feelings of anxiety as evidenced by decreased increased heart rate, shortness of breath, decreased energy, loss of interest from 7 days/week to 0 to 1 days/week per patient report for at least 3 consecutive months. Target Date: 06/26/25  Progress: progressing    Short-term goal:  Increase patient's awareness of patient's verbal and nonverbal communication with others and how patient's communication is perceived by others  Target Date: 06/26/24  Progress: patient reported as of 07/02/24 patient feelings this goal has been met    Develop and implement healthy communication strategies for patient to utilize when expressing his thoughts and feelings to others    Target Date: 06/26/25  Progress: progressing    Develop and implement coping strategies for patient to utilize to decrease the negative impact chronic pain has on patient's ability to participate in family activities Target Date: 06/26/25  Progress: progressing    Maintain physical activity 3-5 days per week.  Target Date: 06/26/24   Progress: patient reported as of 07/02/24 patient feels this goal has been met    Increase patient's participation in activities patient enjoys, such as, Tai Chi, teaching opportunities, volunteer opportunities. Target Date: 06/26/25  Progress: progressing         Darice Seats, LCSW

## 2024-07-02 NOTE — Telephone Encounter (Signed)
 Spoke to patient and he express that he is having chest pain that is radiating to the shoulder blade on the left side of the chest. Pt declines shortness of breath, nausea, vomiting and sweating. Current BP 139/88 and heart rate of 89. Pt asked if he should take a nitroglycerin  and patient was advise to take dose. Pt first stated the nitroglycerin  was helpful, but then retracted his statement and said it hadn't. Pt advised to go to emergency room for further evaluation. Pt states that he will think about going to emergency room. Appt made with APP tomorrow just incase patient did not go to the emergency room.

## 2024-07-02 NOTE — Progress Notes (Signed)
   Darice Seats, LCSW

## 2024-07-03 ENCOUNTER — Ambulatory Visit: Attending: Emergency Medicine | Admitting: Emergency Medicine

## 2024-07-03 ENCOUNTER — Encounter: Payer: Self-pay | Admitting: Emergency Medicine

## 2024-07-03 VITALS — BP 104/68 | HR 105 | Ht 69.0 in | Wt 168.2 lb

## 2024-07-03 DIAGNOSIS — R002 Palpitations: Secondary | ICD-10-CM

## 2024-07-03 DIAGNOSIS — R0789 Other chest pain: Secondary | ICD-10-CM

## 2024-07-03 DIAGNOSIS — I4719 Other supraventricular tachycardia: Secondary | ICD-10-CM

## 2024-07-03 DIAGNOSIS — I2510 Atherosclerotic heart disease of native coronary artery without angina pectoris: Secondary | ICD-10-CM

## 2024-07-03 DIAGNOSIS — E785 Hyperlipidemia, unspecified: Secondary | ICD-10-CM | POA: Diagnosis not present

## 2024-07-03 MED ORDER — ISOSORBIDE MONONITRATE ER 30 MG PO TB24: 15.0000 mg | ORAL_TABLET | Freq: Every day | ORAL | 3 refills | Status: DC

## 2024-07-03 NOTE — Progress Notes (Signed)
 Cardiology Office Note:    Date:  07/03/2024  ID:  JOSUE FALCONI, DOB Feb 09, 1961, MRN [id number] PCP: Johnny Garnette LABOR, MD  [facility] Providers Cardiologist:  Alm Clay, MD       Patient Profile:       Chief Complaint: Acute visit for chest pain History of Present Illness:  [name] is a 63 y.o. male with visit-pertinent history of coronary artery disease, anxiety/depression, chronic pain syndrome, ADHD, tobacco abuse, coronary artery disease  He was admitted in March 2024 with NSTEMI.  Subsequent cardiac catheterization showed patent left main and LAD, eccentric nonobstructive mid RCA stenosis with diffuse plaquing, severe subtotal 9 9% stenosis in the mid circumflex artery with sequential lesion into the branching OM that was treated with DES.  EF was 55%.  Echocardiogram obtained on 12/11/2022 showed EF 60 to 65%, grade 1 DD, no RWMA, no significant valve issue.  Postprocedure he was placed on aspirin  and Brilinta .  He was seen on follow-up by Dr. Clay in April 2024 at which time he had a lot of atypical symptoms such as fatigue and chest discomfort.  It was suspected that his symptoms may be related to stress and anxiety.  He had significant fatigue on metoprolol  which was eventually discontinued in August 2024.  Heart monitor in August 2024 showed no significant arrhythmia despite his complaint of palpitations.  He was started on diltiazem  which he could not tolerate due to leg swelling.  He was seen by [place], [place] in September 2020 for which he had multiple complaints.  He continued to have palpitations in the morning time.  This was thought to be related to adrenaline rush and the release of cortisol.  He also noted occasional spontaneous dyspnea more noticeable when the weather is humid and hot.  He followed up in clinic on 07/12/2023.  He was started on low-dose bisoprolol  at nighttime to help with morning palpitations.  In December 2024 he followed up with  his PCP and mentioned his PCP did not feel different after starting bisoprolol  therefore he discontinued it.  He was last seen in clinic on 12/12/2023 by [place], [place].  His Brilinta  was discontinued given 1 year from his stent placement.  He was continued on aspirin  indefinitely.  He continued to have morning episodes of palpitations.  He has occasional chest discomfort which does not occur with physical exertion.  He is able to ride the elliptical without exertional symptoms.  No medication changes were made.  Follow-up in 6 months.  He followed up with Pharm.D. on 04/25/2024.  His most recent LDL was 79.  He was not able to tolerate rosuvastatin  due to myalgias.  Stopped atorvastatin  due to complaints of chest pounding.  His repeat LDL was 165.  He was to retry atorvastatin  40 mg daily.  Most recently he called in the nurse triage line on 10/6 reporting chest pains.  Office visit with made.    Discussed the use of AI scribe software for clinical note transcription with the patient, who gave verbal consent to proceed.  History of Present Illness [name] is a 63 year old male with coronary artery disease and prior stent placement who presents with chest pain.  He presents today with his wife.  Today he continues to experience chest pains.  However, he did experience a new or different type of chest pain which is why he made this visit today.  He does have a very difficult time explaining his symptoms  to me.  He experienced a sharp chest pain, located to the left side of his chest, lasting around 5 to 6 hours approximately 2 days ago that occurred while he was painting his back patio.  He tried a nitroglycerin  but this did not relieve his pain.  He reported his pain just eventually went away.  He also notes an epigastric burning sensation that occurred 1 week ago, initially thought to be heartburn, resolved on its own.  He wakes up with a racing heart, palpitations, and chest pains  daily since his heart attack.  He has a very difficult time explaining what the chest pain feels like other than maybe tightness.  These symptoms occur in the morning and last several hours.  It seems like coffee in the morning helps alleviate the symptoms. He is intolerant to several beta blockers due to adverse effects like insomnia or they just did not help.  He remains active, using an elliptical bike for 30 minutes every other day and achieving about 4,000 steps daily without chest pain during these exercises.  He does continue to take atorvastatin .  He reports discussions are ongoing about switching to an injectable cholesterol medication such as Repatha.  He denies any orthopnea, PND, LEE, dyspnea, syncope, presyncope.  He does report frequent anxiety and is scheduled to see his psychiatrist later this week.   Review of systems:  Please see the history of present illness. All other systems are reviewed and otherwise negative.      Studies Reviewed:    EKG Interpretation Date/Time:  Tuesday July 03 2024 14:46:50 EDT Ventricular Rate:  105 PR Interval:  142 QRS Duration:  84 QT Interval:  336 QTC Calculation: 444 R Axis:   28  Text Interpretation: Sinus tachycardia with Premature supraventricular complexes When compared with ECG of 12-Dec-2023 14:40, Premature supraventricular complexes are now Present Nonspecific T wave abnormality no longer evident in Anterior leads Confirmed by Rana Dixon [phone]) on 07/03/2024 6:27:49 PM    ZIO 04/26/2023   The dominant rhythm is normal sinus rhythm with normal circadian variation.   There are rare premature atrial contractions and only 2 very brief episodes of nonsustained atrial tachycardia (4-5 beats long).  There is no evidence of atrial fibrillation.   There are rare premature ventricular contractions and there is no evidence of complex ventricular arrhythmia.   There are no episodes of severe bradycardia, pauses or high-grade  atrioventricular block.   Several symptom triggered recordings are submitted for analysis.  These all showed normal sinus rhythm or mild sinus tachycardia.   Minimal abnormalities on 6-day event monitor. During symptomatic episodes, the rhythm is normal. 2 very brief episodes of ectopic atrial tachycardia lasting a maximum 5 beats are seen and are of doubtful clinical significance.   Patch Wear Time:  6 days and 0 hours (2024-08-02T19:07:18-0400 to 2024-08-08T19:42:30-0400)   Patient had a min HR of 54 bpm, max HR of 141 bpm, and avg HR of 90 bpm. Predominant underlying rhythm was Sinus Rhythm. 2 Supraventricular Tachycardia runs occurred, the run with the fastest interval lasting 4 beats with a max rate of 113 bpm, the  longest lasting 5 beats with an avg rate of 111 bpm. Isolated SVEs were rare (<1.0%), SVE Couplets were rare (<1.0%), and no SVE Triplets were present. Isolated VEs were rare (<1.0%), and no VE Couplets or VE Triplets were present.   Echocardiogram 12/11/2022 1. Left ventricular ejection fraction, by estimation, is 60 to 65%. The  left ventricle has normal  function. The left ventricle has no regional  wall motion abnormalities. Left ventricular diastolic parameters are  consistent with Grade I diastolic  dysfunction (impaired relaxation).   2. Right ventricular systolic function is normal. The right ventricular  size is normal. There is normal pulmonary artery systolic pressure.   3. No evidence of mitral valve regurgitation.   4. Aortic valve regurgitation is not visualized.   5. The inferior vena cava is normal in size with greater than 50%  respiratory variability, suggesting right atrial pressure of 3 mmHg.   Cardiac catheterization 12/10/2022 1.  Patent left main and LAD with mild nonobstructive plaquing but no significant stenosis 2.  Moderate eccentric nonobstructive mid RCA stenosis with diffuse plaquing throughout the large dominant RCA 3.  Severe subtotal 99%  stenosis of the mid circumflex with a sequential lesion into the branching obtuse marginal, treated successfully with PCI using a 2.25 x 24 mm Synergy DES 4.  Mild segmental LV contraction abnormality with mild hypokinesis of the anterolateral wall and preserved overall LVEF of 55%   Recommendations: Aggressive medical therapy, DAPT with aspirin  and ticagrelor  at least 12 months without an interruption.  As long as no complications arise, hospital discharge tomorrow. Diagnostic Dominance: Right  Intervention   Risk Assessment/Calculations:              Physical Exam:   VS:  BP 104/68   Pulse (!) 105   Ht 5' 9 (1.753 m)   Wt 168 lb 3.2 oz (76.3 kg)   SpO2 95%   BMI 24.84 kg/m    Wt Readings from Last 3 Encounters:  07/03/24 168 lb 3.2 oz (76.3 kg)  12/12/23 171 lb 12.8 oz (77.9 kg)  09/01/23 169 lb (76.7 kg)    GEN: Well nourished, well developed in no acute distress NECK: No JVD; No carotid bruits CARDIAC: RRR, no murmurs, rubs, gallops RESPIRATORY:  Clear to auscultation without rales, wheezing or rhonchi  ABDOMEN: Soft, non-tender, non-distended EXTREMITIES:  No edema; No acute deformity      Assessment and Plan:  Coronary artery disease Cardiac catheterization 11/2022 with severe subtotal 99% stenosis of the mid circumflex with a subsequent lesion into the branching obtuse marginal treated with PCI/DES.  Moderate eccentric nonobstructive mid RCA stenosis with diffuse plaquing throughout the large dominant RCA.  Patent left main and LAD with mild nonobstructive plaquing but no significant stenosis - He reports he experiences daily chest pains, palpitations, and racing heart every morning since his cardiac catheterization on 11/2022.  Reports chest pain feels like a tightness and occurs for several hours at a time - What prompted today's visit was a new or different type of chest pain that occurred approximately 2 days ago described as sharp while painting his back patio.   The chest pain was located to the left side of his chest and lasted approximately 5 to 6 hours which was unresponsive to x 1 nitroglycerin    - Today however he is without chest pains and stable.  He tells me he remains very active using an elliptical bike for 30 minutes every other day and achieving 4000 steps daily without any exertional symptoms on exercise - Given this new episode of chest pain was a singular event without reoccurrence and atypical symptoms and given he is very active without any exertional chest pains I will add isosorbide 15 mg daily for further antianginal therapy - Continue aspirin  81 mg indefinitely - Continue amlodipine  10 mg daily - Continue atorvastatin  40 mg daily -  Start isosorbide 15 mg daily - He would like to keep his already scheduled follow-up visit with Dr. Anner in 2 weeks  Morning palpitations ZIO 03/2023 with 2 very brief episodes of ectopic atrial tachycardia lasting a maximum of 5 beats Reports that he wakes up daily with tachycardia, palpitations, and chest pains.  Reports this has been ongoing since cardiac catheterization on 11/2022.  Tells me the symptoms can last for several hours but seems to improve after he drinks coffee Has tried bisoprolol  in the past without improvement.  Has been intolerant of beta-blockers in the past - He has had a reassuring heart monitor in the past.  Likely related to adrenaline rush of morning cortisol release.  He tells me he will start monitoring for sleep apnea on his Apple Watch  Hyperlipidemia LDL 165 on 04/2024.  He was not taking atorvastatin  at that time and restarted on 05/01/2024 - He denies any myalgias since restarting atorvastatin  - He is currently working with [employer] to start Repatha or Leqvio - Continue atorvastatin  40 mg daily      Dispo:  Return in about 3 weeks (around 07/24/2024).  Signed, Lum LITTIE Louis, NP

## 2024-07-03 NOTE — Patient Instructions (Signed)
 Medication Instructions:  START TAKING ISOSORBIDE MONONITRATE 15 MG (1/2 TABLET) AT NIGHT.  Lab Work: NONE TO BE DONE TODAY.  Testing/Procedures: NONE  Follow-Up: At [facility], you and your health needs are our priority.  As part of our continuing mission to provide you with exceptional heart care, our providers are all part of one team.  This team includes your primary Cardiologist (physician) and Advanced Practice Providers or APPs (Physician Assistants and Nurse Practitioners) who all work together to provide you with the care you need, when you need it.  Your next appointment:   KEEP APPOINTMENT ON 07-18-24 AT 1:40 PM WITH DR. HOCHREIN.  Provider:   Alm Clay, MD

## 2024-07-05 DIAGNOSIS — F322 Major depressive disorder, single episode, severe without psychotic features: Secondary | ICD-10-CM | POA: Diagnosis not present

## 2024-07-18 ENCOUNTER — Ambulatory Visit: Attending: Cardiology | Admitting: Cardiology

## 2024-07-18 ENCOUNTER — Encounter: Payer: Self-pay | Admitting: Cardiology

## 2024-07-18 ENCOUNTER — Ambulatory Visit

## 2024-07-18 VITALS — BP 118/76 | HR 105 | Ht 68.5 in | Wt 169.0 lb

## 2024-07-18 DIAGNOSIS — E785 Hyperlipidemia, unspecified: Secondary | ICD-10-CM

## 2024-07-18 DIAGNOSIS — I214 Non-ST elevation (NSTEMI) myocardial infarction: Secondary | ICD-10-CM

## 2024-07-18 DIAGNOSIS — J301 Allergic rhinitis due to pollen: Secondary | ICD-10-CM

## 2024-07-18 DIAGNOSIS — I2510 Atherosclerotic heart disease of native coronary artery without angina pectoris: Secondary | ICD-10-CM

## 2024-07-18 DIAGNOSIS — T466X5D Adverse effect of antihyperlipidemic and antiarteriosclerotic drugs, subsequent encounter: Secondary | ICD-10-CM

## 2024-07-18 DIAGNOSIS — I25119 Atherosclerotic heart disease of native coronary artery with unspecified angina pectoris: Secondary | ICD-10-CM

## 2024-07-18 DIAGNOSIS — F32A Depression, unspecified: Secondary | ICD-10-CM | POA: Diagnosis not present

## 2024-07-18 DIAGNOSIS — F419 Anxiety disorder, unspecified: Secondary | ICD-10-CM | POA: Diagnosis not present

## 2024-07-18 DIAGNOSIS — I479 Paroxysmal tachycardia, unspecified: Secondary | ICD-10-CM | POA: Diagnosis not present

## 2024-07-18 DIAGNOSIS — I10 Essential (primary) hypertension: Secondary | ICD-10-CM | POA: Diagnosis not present

## 2024-07-18 DIAGNOSIS — R002 Palpitations: Secondary | ICD-10-CM

## 2024-07-18 DIAGNOSIS — M791 Myalgia, unspecified site: Secondary | ICD-10-CM

## 2024-07-18 NOTE — Patient Instructions (Signed)
 Medication Instructions:  NO CHANGES   *If you need a refill on your cardiac medications before your next appointment, please call your pharmacy*   Lab Work: LIPID LIVER PANEL  If you have labs (blood work) drawn today and your tests are completely normal, you will receive your results only by: MyChart Message (if you have MyChart) OR A paper copy in the mail If you have any lab test that is abnormal or we need to change your treatment, we will call you to review the results.   Testing/Procedures:  Your physician has recommended that you wear a holter monitor- 3 DAYS ZIO. Holter monitors are medical devices that record the heart's electrical activity. Doctors most often use these monitors to diagnose arrhythmias. Arrhythmias are problems with the speed or rhythm of the heartbeat. The monitor is a small, portable device. You can wear one while you do your normal daily activities. This is usually used to diagnose what is causing palpitations/syncope (passing out).   Follow-Up: At [facility], you and your health needs are our priority.  As part of our continuing mission to provide you with exceptional heart care, we have created designated Provider Care Teams.  These Care Teams include your primary Cardiologist (physician) and Advanced Practice Providers (APPs -  Physician Assistants and Nurse Practitioners) who all work together to provide you with the care you need, when you need it.     Your next appointment:   3 month(s)  The format for your next appointment:   In Person  Provider:   Alm Clay, MD   Your physician recommends that you schedule a follow-up appointment in: [facility]- PHARMACY TEAM  - DISCUSS REPATHA   Other Instructions   ZIO XT- Long Term Monitor Instructions  Your physician has requested you wear a ZIO patch monitor for 3 days.  This is a single patch monitor. [employer] supplies one patch monitor per enrollment. Additional stickers are not available. Please  do not apply patch if you will be having a Nuclear Stress Test,  Echocardiogram, Cardiac CT, MRI, or Chest Xray during the period you would be wearing the  monitor. The patch cannot be worn during these tests. You cannot remove and re-apply the  ZIO XT patch monitor.  Your ZIO patch monitor will be mailed 3 day USPS to your address on file. It may take 3-5 days  to receive your monitor after you have been enrolled.  Once you have received your monitor, please review the enclosed instructions. Your monitor  has already been registered assigning a specific monitor serial # to you.  Billing and Patient Assistance Program Information  We have supplied [employer] with any of your insurance information on file for billing purposes. [employer] offers a sliding scale Patient Assistance Program for patients that do not have  insurance, or whose insurance does not completely cover the cost of the ZIO monitor.  You must apply for the Patient Assistance Program to qualify for this discounted rate.  To apply, please call [employer] at [phone], select option 4, select option 2, ask to apply for  Patient Assistance Program. Meredeth will ask your household income, and how many people  are in your household. They will quote your out-of-pocket cost based on that information.  [employer] will also be able to set up a 55-month, interest-free payment plan if needed.  Applying the monitor   Shave hair from upper left chest.  Hold abrader disc by orange tab. Rub abrader in 40 strokes over the upper  left chest as  indicated in your monitor instructions.  Clean area with 4 enclosed alcohol pads. Let dry.  Apply patch as indicated in monitor instructions. Patch will be placed under collarbone on left  side of chest with arrow pointing upward.  Rub patch adhesive wings for 2 minutes. Remove white label marked 1. Remove the white  label marked 2. Rub patch adhesive wings for 2 additional minutes.  While looking in a  mirror, press and release button in center of patch. A small green light will  flash 3-4 times. This will be your only indicator that the monitor has been turned on.  Do not shower for the first 24 hours. You may shower after the first 24 hours.  Press the button if you feel a symptom. You will hear a small click. Record Date, Time and  Symptom in the Patient Logbook.  When you are ready to remove the patch, follow instructions on the last 2 pages of Patient  Logbook. Stick patch monitor onto the last page of Patient Logbook.  Place Patient Logbook in the blue and white box. Use locking tab on box and tape box closed  securely. The blue and white box has prepaid postage on it. Please place it in the mailbox as  soon as possible. Your physician should have your test results approximately 7 days after the  monitor has been mailed back to [facility].  Call [facility] Customer Care at [phone] if you have questions regarding  your ZIO XT patch monitor. Call them immediately if you see an orange light blinking on your  monitor.  If your monitor falls off in less than 4 days, contact our Monitor department at [phone].  If your monitor becomes loose or falls off after 4 days call [employer] at [phone] for  suggestions on securing your monitor

## 2024-07-18 NOTE — Progress Notes (Signed)
 " Cardiology Office Note:  .   Date:  07/20/2024  ID:  [name], DOB 1961/06/01, MRN [id number] PCP: [name] LABOR, MD  [facility] Providers Cardiologist:  Ronald Clay, MD     Chief Complaint  Patient presents with   Follow-up    2-week follow-up but has been a long time since he saw me.   Palpitations    Still has morning tachycardia   Hypertension    Blood pressure is less than all over the place.   Coronary Artery Disease    He has chest pain but is very atypical sounding.  Not with elliptical training or with his weight training.    Patient Profile: .     [name] is a somewhat anxious 63 y.o. male with a PMH notable for CAD/non-STEMI, HTN, HLD who presents here for 2-week follow-up at the request of Ronald Louis, French  Non-STEMI March 2024: 99% LCx (DES PCI with Synergy XD 2.25 x 24 mm postdilated 2.5 mm.) HTN HLD Chronic anxiety and pain     I last saw [name] in April 2024: As usual, he had multiple questions.  He felt that he just felt off .  Still having some dyspnea and fatigue throughout the day.  Still having chest pain off-and-on but not associated with stress or exertion.  Noted fatigue as well as joint pains etc.  Pedal neuropathy. => Reinstructed to take either his amlodipine  or Toprol  at bedtime.  Were also concerned about his lipids and ordered a lipid panel.  He subsequently saw [name] July 12, 2023 roughly 1 month after seeing [name], [name].  He was still noticing waking up with his heart racing at roughly 7 in the morning.  Heart rates were in the 100s.  He felt like it may be anxiety.  Interestingly he was able to use the electrical trainer and lifts weights with no symptoms of rapid heart rates.  He had had a Zio patch monitor ordered in July that really was benign.  He was started on low-dose bisoprolol  (clearly Toprol  have been discontinued).  Unfortunately did not notice any change after  taking bisoprolol  so he stopped taking it.  He is [name], [name] on December 12, 2023 with complaints of palpitations.  He noted that he had stopped taking bisoprolol .  He was told that he could stop taking Brilinta  and stay on aspirin .  Continue to note morning tachycardia and bilateral peripheral neuropathy.  Writing elliptical trainer for at least 25 minutes without any chest pain pressure.  LDL was 79 so they decided to try to start Zetia in addition to statin.  He was referred to lipid clinic and saw [name] on July 30.  He noted feeling significantly better after stopping Brilinta  noticing the horses and ulnar tap dancing on his heart.  He said that he stopped taking his statin for a while on vacation and felt better immediately.  He did not feel any different with rosuvastatin  versus atorvastatin . => They reviewed PCSK9 inhibitors as well as inclisiran and bempedoic acid.  He was agreeable to consider a reduced dose of atorvastatin  and follow-up labs.  The plan will be to consider Livalo. => Unfortunately, his LDL went up to 165 having stopped the statin.  He was given another prescription for 40 mg atorvastatin .   He was last seen on October 7 by [name], [name] for complaints of chest pain.  He continued to experience  chest pain but this time felt no new or different type of chest pain.  He describes a sharp pain on the left side of his chest lasting 5 to 6 hours.  The first episode was 2 to 3 days before the visit.  Nitroglycerin  did not help relieve the pain.  He also noted some epigastric burning and thought maybe it was heartburn.  He still noted waking up with rapid heart rates and chest pain thinking this may be a panic attack.  Apparently he had had intolerance to beta-blockers due to insomnia or just not helping.  Remain active.  No chest pain with exertion. => He started low-dose isosorbide  mononitrate 15 mg to see if it would help symptoms and recommended that he continue  to follow-up with originally scheduled appointment today.    He was just seen on October 7 by Ronald Louis, French for complaints of new and different type of chest pain over the last several days described as sharp pain while painting his patio.  Left-sided chest pain and the episode lasted 5 to 6 hours.  It was unresponsive to nitroglycerin .  He had Imdur .  Otherwise no changes  Subjective  Discussed the use of AI scribe software for clinical note transcription with the patient, who gave verbal consent to proceed.  History of Present Illness [name] is a 63 year old male with coronary artery disease who presents with recurrent chest pain.  He experiences chest pain every morning upon waking, which resolves after getting out of bed and having coffee and a baby aspirin . The pain is not associated with physical activities such as walking or biking. He describes the sensation as a 'fluttering' or racing heart rather than pain, noting it feels fast. These symptoms began after his stent placement.  No shortness of breath, leg swelling, or waking up in the middle of the night with shortness of breath. No heart racing, skipping, or flipping sensations during physical activity. He experiences muscle spasms affecting his feet, which occur when leaning over to grab something, amplifying his chronic pain.  He is currently taking atorvastatin , which he resumed after a two-month break, during which his cholesterol levels increased. He also takes aspirin  every morning and amlodipine  at night. He previously took metoprolol  and propranolol  but discontinued them, stopping propranolol  last Sunday or Monday.  He experiences a dry cough and takes half a Benadryl at night for sinus symptoms, which he finds effective. No GERD. He notes his heart rate decreases during exercise, which he finds unusual, and tracks his heart rate, observing it goes down on the elliptical despite feeling anxious at  times.     Objective   Current Meds--   -- Cardiac Meds: Amlodipine  10 mg every afternoon; Imdur  30 mg-1/2 tab daily (stopped); propranolol  10 mg nightly-also not taking Atorvastatin  40 L daily Aspirin  81 mg daily  isosorbide  mononitrate (IMDUR ) 30 MG 24 hr tablet Take 0.5 tablets (15 mg total) by mouth daily.not taking    isosorbide  mononitrate (IMDUR ) 30 MG 24 hr tablet Take 0.5 tablets (15 mg total) by mouth daily.not taking     Medication Sig   acyclovir  (ZOVIRAX ) 800 MG tablet Take 1 tablet (800 mg total) by mouth 2 (two) times daily.   acyclovir  ointment (ZOVIRAX ) 5 % APPLY 1 APPLICATION TOPICALLY EVERY 3 (THREE) HOURS.   cetirizine (ZYRTEC) 10 MG tablet Take 10 mg by mouth daily.   diazepam  (VALIUM ) 10 MG tablet Take 10 mg by mouth 3 (three) times  daily.   diclofenac  (VOLTAREN ) 50 MG EC tablet Take 1 tablet (50 mg total) by mouth 2 (two) times daily.   diclofenac  sodium (VOLTAREN ) 1 % GEL Apply 1 application  topically daily as needed (For pain).   EPINEPHrine  0.3 mg/0.3 mL IJ SOAJ injection USE AS DIRECTED   fentaNYL  (DURAGESIC  - DOSED MCG/HR) 50 MCG/HR Place 50 mcg onto the skin every other day.   fluticasone  (FLONASE ) 50 MCG/ACT nasal spray Place 2 sprays into both nostrils daily.   lactulose, encephalopathy, (CHRONULAC) 10 GM/15ML SOLN Take 20 g by mouth at bedtime.   METHOCARBAMOL PO Take by mouth as needed.   nitroGLYCERIN  (NITROSTAT ) 0.4 MG SL tablet Place 1 tablet (0.4 mg total) under the tongue every 5 (five) minutes as needed for chest pain.   oxyCODONE  (OXY IR/ROXICODONE ) 5 MG immediate release tablet Take 5 mg by mouth every 6 (six) hours as needed for moderate pain.   promethazine (PHENERGAN) 25 MG tablet Take 12.5 mg by mouth every 6 (six) hours as needed for nausea or vomiting.   scopolamine  (TRANSDERM SCOP , 1.5 MG,) 1 MG/3DAYS Place 1 patch (1.5 mg total) onto the skin every 3 (three) days.   traZODone  (DESYREL ) 100 MG tablet Take 50 mg by mouth at bedtime.    triamcinolone  cream (KENALOG ) 0.1 % Apply 1 application topically 2 (two) times daily.   Diet: Mostly home-cooked-80%.  Tries to pay attention to eating healthy when eating out.  He usually eats chicken and fish as well as lots of vegetables.  Tries to avoid snacking.  Does fruit smoothies.  Exercises every other day for 30 minutes on elliptical bike and on the other days does 15 minutes of weight exercise and/or tai chi.  Studies Reviewed: SABRA   EKG Interpretation Date/Time:  Wednesday July 18 2024 13:35:18 EDT Ventricular Rate:  105 PR Interval:  140 QRS Duration:  82 QT Interval:  336 QTC Calculation: 444 R Axis:   150  Text Interpretation: Sinus tachycardia Left posterior fascicular block When compared with ECG of 03-Jul-2024 14:46, Premature supraventricular complexes are no longer Present Left posterior fascicular block is now Present Confirmed by Anner Lenis ([id number]) on 07/18/2024 2:00:41 PM    Lab Results  Component Value Date   CHOL 236 (H) 05/01/2024   HDL 55 05/01/2024   LDLCALC 165 (H) 05/01/2024   LDLDIRECT 179.9 12/18/2012   TRIG 90 05/01/2024   CHOLHDL 4.3 05/01/2024   Lab Results  Component Value Date   NA 136 09/01/2023   K 4.0 09/01/2023   CREATININE 0.97 09/01/2023   GFR 84.38 09/01/2023   GLUCOSE 111 (H) 09/01/2023   Lab Results  Component Value Date   HGBA1C 5.9 09/01/2023    6-7-day Zio patch monitor: (July 2024) minimal abnormalities.  Predominantly sinus rhythm.  Rare PACs and PVCs.  2 very brief episodes of nonsustained atrial tachycardia 4-5 beats.  No evidence of arrhythmia. ECHO: (12/11/2022) LVEF 60 to 65%.  No RWMA.  GR 1 DD.  Normal RV.  Normal PAP.  Normal RAP.  Normal valves. CATH-PCI: (12/10/2022) culprit lesion 99% mid LCx (subtotal occlusion, with sequential lesion into branching OM) => DES PCI Synergy XD 2.25 x 24 mm.  Minimal LM/LAD disease.  30% proximal RCA, 50% mid RCA.  Mild anterolateral hypokinesis with LVEF of  55%.  Recommendations: Aggressive medical therapy, DAPT with aspirin  and ticagrelor  at least 12 months without an interruption.  As long as no complications arise, hospital discharge tomorrow. Diagnostic: Dominance: Right  Intervention       Risk Assessment/Calculations:         Physical Exam:   VS:  BP 118/76   Pulse (!) 105   Ht 5' 8.5 (1.74 m)   Wt 169 lb (76.7 kg)   SpO2 93%   BMI 25.32 kg/m    Wt Readings from Last 3 Encounters:  07/18/24 169 lb (76.7 kg)  07/03/24 168 lb 3.2 oz (76.3 kg)  12/12/23 171 lb 12.8 oz (77.9 kg)      GEN: Well nourished, well developed in no acute distress; very anxious but healthy-appearing. NECK: No JVD; No carotid bruits CARDIAC: Normal S1, S2; RRR, no murmurs, rubs, gallops RESPIRATORY:  Clear to auscultation without rales, wheezing or rhonchi ; nonlabored, good air movement. ABDOMEN: Soft, non-tender, non-distended EXTREMITIES:  No edema; No deformity     ASSESSMENT AND PLAN: .    Problem List Items Addressed This Visit       Cardiology Problems   CAD- 1 Vessel, DES PCI LCx. (NSTEMI) (Chronic)   Stent placement with symptoms not consistent with angina. Good exercise tolerance, suggesting non-cardiac origin for chest discomfort. Continue to work on GDMT for CRF's: - Was taken off Thienopyridine and started on aspirin  monotherapy  - Okay to hold aspirin  5 days prior to surgeries or procedures. - Continue amlodipine  10 mg daily-which she seems to be tolerating. -Would like to see if we get him on some type of beta-blocker or potentially even a nondihydropyridine calcium  channel blocker such as diltiazem  or verapamil  because of his tachycardia episodes, resting heart rate in the 100S and for additional antianginal benefit.  - Await monitor results to evaluate heart rate responsiveness and Consider nadolol or diltiazem  if beta-blockers ineffective.  - Continue to work on lipid-lowering with lipid clinic.  I think he would  benefit from Leqvio with this shortness timeframe for side effects and the longest duration of therapy.  Also witnessed treatment.      Essential hypertension, benign (Chronic)   Thankfully, his blood pressure is well-controlled and he is only on the amlodipine .  Would like to get him back on either beta-blocker or nondihydropyridine calcium  blocker.  For now continue amlodipine  10 mg daily      Hyperlipidemia with target low density lipoprotein (LDL) cholesterol less than 55 mg/dL (Chronic)   Hyperlipidemia with statin-associated myalgia and muscle cramps LDL levels not at goal. Statin-associated myalgia noted. Leqvio preferred for ease of administration and minimal side effects. Insurance coverage necessary. - Order lipid panel to assess cholesterol levels. - Refer to Lipid Clinic for hyperlipidemia management. - Discuss prior authorization for Leqvio. - Consider Repatha or Praluent if Leqvio not feasible.      NSTEMI (non-ST elevated myocardial infarction) (HCC) (Chronic)   18 months out from his non-STEMI well-preserved EF on echo with no heart failure symptoms.  He has lots of atypical sounding chest discomfort symptoms but none sound to be cardiac in nature.      Paroxysmal tachycardia (HCC) (Chronic)   Intermittent palpitations and tachycardia, primarily in the morning.  Symptoms suggest non-cardiac origin.  Most likely associate with anxiety Previous monitoring showed no significant arrhythmias. - Order 3-day cardiac monitor to assess for arrhythmias. - Evaluate monitor results for beta-blocker therapy need. -  Consider nadolol or diltiazem  if beta-blockers ineffective. - Monitor heart rate and symptoms during follow-up.         Other   Allergic rhinitis   Allergic rhinitis with Chronic cough potentially related to allergies. Symptoms  improve with antihistamines. - Recommend daily Zyrtec or Claritin  for allergy management. - Monitor cough and sinus symptoms for  improvement.      Anxiety and depression (Chronic)   I really think anxiety and panic attacks are the main driving forces for his symptoms.  He is work with PCP plus or minus psychiatry to help follow-up with treatment.      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  Will have him touch back base with the [facility] lipid clinic run by our clinical pharmacist.  I truly think that Leqvio would be his best option as it would be witnessed therapy and only every 6 months once he gets the booster dose.  Least likely to have side effects.      Other Visit Diagnoses       Coronary artery disease involving native coronary artery of native heart without angina pectoris    -  Primary   Relevant Orders   EKG 12-Lead (Completed)   Hepatic function panel   Lipid panel   LONG TERM MONITOR (3-14 DAYS)     Palpitations       Relevant Orders   LONG TERM MONITOR (3-14 DAYS)     Hyperlipidemia LDL goal <70       Relevant Orders   Hepatic function panel   Lipid panel   LONG TERM MONITOR (3-14 DAYS)            Follow-Up: Return in about 3 months (around 10/18/2024) for Routine follow up with me, [employer].  I spent 72 minutes in the care of [name] today including reviewing labs (2 minutes), reviewing studies (I briefly reviewed his cath films and echocardiogram as well as Zio patch monitor-6 minutes), face to face time discussing treatment options (31 minutes), reviewing records from about 7 clinic visits from the time I last saw him including my last note this included APP and Pharm.D. notes (18 minutes), 15 minutes dictating, and documenting in the encounter.      Signed, Ronald MICAEL Clay, MD, MS [name], M.D., M.S. Interventional Cardiologist  [facility] Pager # [phone]       "

## 2024-07-18 NOTE — Progress Notes (Unsigned)
 Enrolled for [employer] to mail a ZIO XT long term holter monitor to the patients address on file.

## 2024-07-19 NOTE — Progress Notes (Incomplete)
 Cardiology Office Note:  .   Date:  07/18/2024  ID:  [name], DOB Feb 25, 1961, MRN [id number] PCP: Johnny Garnette LABOR, MD  [facility] Providers Cardiologist:  Alm Clay, MD { Click to update primary MD,subspecialty MD or APP then REFRESH:1}    No chief complaint on file.   Patient Profile: .     [name] is a somewhat anxious 63 y.o. male with a PMH notable for CAD/non-STEMI, HTN, HLD who presents here for 2-week follow-up at the request of [name], NP  Non-STEMI March 2024: 99% LCx (DES PCI with Synergy XD 2.25 x 24 mm postdilated 2.5 mm.) HTN HLD Chronic anxiety and pain     I last saw [name] in April 2024: As usual, he had multiple questions.  He felt that he just felt off .  Still having some dyspnea and fatigue throughout the day.  Still having chest pain off-and-on but not associated with stress or exertion.  Noted fatigue as well as joint pains etc.  Pedal neuropathy. => Reinstructed to take either his amlodipine  or Toprol  at bedtime.  Were also concerned about his lipids and ordered a lipid panel.  He subsequently saw Lamarr Satterfield, NP July 12, 2023 roughly 1 month after seeing Scot Ford, [place].  He was still noticing waking up with his heart racing at roughly 7 in the morning.  Heart rates were in the 100s.  He felt like it may be anxiety.  Interestingly he was able to use the electrical trainer and lifts weights with no symptoms of rapid heart rates.  He had had a Zio patch monitor ordered in July that really was benign.  He was started on low-dose bisoprolol  (clearly Toprol  have been discontinued).  Unfortunately did not notice any change after taking bisoprolol  so he stopped taking it.  He is Scot Ford, [place] on December 12, 2023 with complaints of palpitations.  He noted that he had stopped taking bisoprolol .  He was told that he could stop taking Brilinta  and stay on aspirin .  Continue to note morning tachycardia and  bilateral peripheral neuropathy.  Writing elliptical trainer for at least 25 minutes without any chest pain pressure.  LDL was 79 so they decided to try to start Zetia in addition to statin.  was last seen on ***    He was just seen on October 7 by [name], NP for complaints of new and different type of chest pain over the last several days described as sharp pain while painting his patio.  Left-sided chest pain and the episode lasted 5 to 6 hours.  It was unresponsive to nitroglycerin .  He had Imdur.  Otherwise no changes  Subjective  Discussed the use of AI scribe software for clinical note transcription with the patient, who gave verbal consent to proceed.  History of Present Illness [name] is a 63 year old male with coronary artery disease who presents with recurrent chest pain.  He experiences chest pain every morning upon waking, which resolves after getting out of bed and having coffee and a baby aspirin . The pain is not associated with physical activities such as walking or biking. He describes the sensation as a 'fluttering' or racing heart rather than pain, noting it feels fast. These symptoms began after his stent placement.  No shortness of breath, leg swelling, or waking up in the middle of the night with shortness of breath. No heart racing, skipping, or flipping sensations during physical  activity. He experiences muscle spasms affecting his feet, which occur when leaning over to grab something, amplifying his chronic pain.  He is currently taking atorvastatin , which he resumed after a two-month break, during which his cholesterol levels increased. He also takes aspirin  every morning and amlodipine  at night. He previously took metoprolol  and propranolol  but discontinued them, stopping propranolol  last Sunday or Monday.  He experiences a dry cough and takes half a Benadryl at night for sinus symptoms, which he finds effective. No GERD. He notes his heart  rate decreases during exercise, which he finds unusual, and tracks his heart rate, observing it goes down on the elliptical despite feeling anxious at times.     Objective   Current Meds--   -- Cardiac Meds: Amlodipine  10 mg every afternoon; Imdur 30 mg-1/2 tab daily (stopped); propranolol  10 mg nightly-also not taking Atorvastatin  40 L daily Aspirin  81 mg daily . isosorbide mononitrate (IMDUR) 30 MG 24 hr tablet Take 0.5 tablets (15 mg total) by mouth daily.not taking   . isosorbide mononitrate (IMDUR) 30 MG 24 hr tablet Take 0.5 tablets (15 mg total) by mouth daily.not taking     Medication Sig  . acyclovir  (ZOVIRAX ) 800 MG tablet Take 1 tablet (800 mg total) by mouth 2 (two) times daily.  . acyclovir  ointment (ZOVIRAX ) 5 % APPLY 1 APPLICATION TOPICALLY EVERY 3 (THREE) HOURS.  . cetirizine (ZYRTEC) 10 MG tablet Take 10 mg by mouth daily.  . diazepam  (VALIUM ) 10 MG tablet Take 10 mg by mouth 3 (three) times daily.  . diclofenac  (VOLTAREN ) 50 MG EC tablet Take 1 tablet (50 mg total) by mouth 2 (two) times daily.  . diclofenac  sodium (VOLTAREN ) 1 % GEL Apply 1 application  topically daily as needed (For pain).  . EPINEPHrine  0.3 mg/0.3 mL IJ SOAJ injection USE AS DIRECTED  . fentaNYL  (DURAGESIC  - DOSED MCG/HR) 50 MCG/HR Place 50 mcg onto the skin every other day.  . fluticasone  (FLONASE ) 50 MCG/ACT nasal spray Place 2 sprays into both nostrils daily.  SABRA lactulose, encephalopathy, (CHRONULAC) 10 GM/15ML SOLN Take 20 g by mouth at bedtime.  . METHOCARBAMOL PO Take by mouth as needed.  . nitroGLYCERIN  (NITROSTAT ) 0.4 MG SL tablet Place 1 tablet (0.4 mg total) under the tongue every 5 (five) minutes as needed for chest pain.  . oxyCODONE  (OXY IR/ROXICODONE ) 5 MG immediate release tablet Take 5 mg by mouth every 6 (six) hours as needed for moderate pain.  . promethazine (PHENERGAN) 25 MG tablet Take 12.5 mg by mouth every 6 (six) hours as needed for nausea or vomiting.  . scopolamine   (TRANSDERM SCOP , 1.5 MG,) 1 MG/3DAYS Place 1 patch (1.5 mg total) onto the skin every 3 (three) days.  . traZODone  (DESYREL ) 100 MG tablet Take 50 mg by mouth at bedtime.  . triamcinolone  cream (KENALOG ) 0.1 % Apply 1 application topically 2 (two) times daily.    Studies Reviewed: SABRA        Lab Results  Component Value Date   CHOL 236 (H) 05/01/2024   HDL 55 05/01/2024   LDLCALC 165 (H) 05/01/2024   LDLDIRECT 179.9 12/18/2012   TRIG 90 05/01/2024   CHOLHDL 4.3 05/01/2024   Lab Results  Component Value Date   NA 136 09/01/2023   K 4.0 09/01/2023   CREATININE 0.97 09/01/2023   GFR 84.38 09/01/2023   GLUCOSE 111 (H) 09/01/2023   Lab Results  Component Value Date   HGBA1C 5.9 09/01/2023    6-7-day Zio patch monitor: (  July 2024) minimal abnormalities.  Predominantly sinus rhythm.  Rare PACs and PVCs.  2 very brief episodes of nonsustained atrial tachycardia 4-5 beats.  No evidence of arrhythmia. ECHO: (12/11/2022) LVEF 60 to 65%.  No RWMA.  GR 1 DD.  Normal RV.  Normal PAP.  Normal RAP.  Normal valves. CATH-PCI: (12/10/2022) culprit lesion 99% mid LCx (subtotal occlusion, with sequential lesion into branching OM) => DES PCI Synergy XD 2.25 x 24 mm.  Minimal LM/LAD disease.  30% proximal RCA, 50% mid RCA.  Mild anterolateral hypokinesis with LVEF of 55%.  Recommendations: Aggressive medical therapy, DAPT with aspirin  and ticagrelor  at least 12 months without an interruption.  As long as no complications arise, hospital discharge tomorrow. Diagnostic: Dominance: Right        Intervention      MONITOR: ***  Risk Assessment/Calculations:   {Does this patient have ATRIAL FIBRILLATION?:[phone]}           Physical Exam:   VS:  BP 118/76   Pulse (!) 105   Ht 5' 8.5 (1.74 m)   Wt 169 lb (76.7 kg)   SpO2 93%   BMI 25.32 kg/m    Wt Readings from Last 3 Encounters:  07/18/24 169 lb (76.7 kg)  07/03/24 168 lb 3.2 oz (76.3 kg)  12/12/23 171 lb 12.8 oz (77.9 kg)    Physical  Exam PULMONARY: Lungs are clear to auscultation bilaterally. Nonlabored breathing. Good air movement.   GEN: Well nourished, well developed in no acute distress; *** NECK: No JVD; No carotid bruits CARDIAC: Normal S1, S2; RRR, no murmurs, rubs, gallops RESPIRATORY:  Clear to auscultation without rales, wheezing or rhonchi ; nonlabored, good air movement. ABDOMEN: Soft, non-tender, non-distended EXTREMITIES:  No edema; No deformity      ASSESSMENT AND PLAN: .    Problem List Items Addressed This Visit   None Visit Diagnoses       Coronary artery disease involving native coronary artery of native heart without angina pectoris    -  Primary   Relevant Orders   EKG 12-Lead (Completed)       Assessment and Plan Assessment & Plan Palpitations and tachycardia Intermittent palpitations and tachycardia, primarily in the morning. Symptoms suggest non-cardiac origin. Previous monitoring showed no significant arrhythmias. - Order 3-day cardiac monitor to assess for arrhythmias. - Evaluate monitor results for beta-blocker therapy need. - Consider nadolol or diltiazem  if beta-blockers ineffective. - Monitor heart rate and symptoms during follow-up.  Atherosclerotic heart disease of native coronary artery Stent placement with symptoms not consistent with angina. Good exercise tolerance, suggesting non-cardiac origin for chest discomfort.  Hyperlipidemia with statin-associated myalgia and muscle cramps LDL levels not at goal. Statin-associated myalgia noted. Leqvio preferred for ease of administration and minimal side effects. Insurance coverage necessary. - Order lipid panel to assess cholesterol levels. - Refer to Lipid Clinic for hyperlipidemia management. - Discuss prior authorization for Leqvio. - Consider Repatha or Praluent if Leqvio not feasible.  Chronic cough, likely allergic Chronic cough potentially related to allergies. Symptoms improve with antihistamines. - Recommend daily  Zyrtec or Claritin  for allergy management. - Monitor cough and sinus symptoms for improvement.  Recording duration: 31 minutes       {Are you ordering a CV Procedure (e.g. stress test, cath, DCCV, TEE, etc)?   Press F2        :[id number]}   Follow-Up: No follow-ups on file.  I spent *** minutes in the care of KAREE CHRISTOPHERSON today including {CHL AMB  CAR Time Based Billing Options STW (Optional):[phone]::documenting in the encounter.}      Signed, Alm MICAEL Clay, MD, MS Alm Clay, M.D., M.S. Interventional Cardiologist  [facility] Pager # [phone]

## 2024-07-20 ENCOUNTER — Encounter: Payer: Self-pay | Admitting: Cardiology

## 2024-07-20 DIAGNOSIS — M791 Myalgia, unspecified site: Secondary | ICD-10-CM | POA: Insufficient documentation

## 2024-07-20 DIAGNOSIS — I479 Paroxysmal tachycardia, unspecified: Secondary | ICD-10-CM | POA: Insufficient documentation

## 2024-07-20 NOTE — Assessment & Plan Note (Signed)
 18 months out from his non-STEMI well-preserved EF on echo with no heart failure symptoms.  He has lots of atypical sounding chest discomfort symptoms but none sound to be cardiac in nature.

## 2024-07-20 NOTE — Assessment & Plan Note (Signed)
 Stent placement with symptoms not consistent with angina. Good exercise tolerance, suggesting non-cardiac origin for chest discomfort. Continue to work on GDMT for CRF's: - Was taken off Thienopyridine and started on aspirin  monotherapy  - Okay to hold aspirin  5 days prior to surgeries or procedures. - Continue amlodipine  10 mg daily-which she seems to be tolerating. -Would like to see if we get him on some type of beta-blocker or potentially even a nondihydropyridine calcium  channel blocker such as diltiazem  or verapamil  because of his tachycardia episodes, resting heart rate in the 100S and for additional antianginal benefit.  - Await monitor results to evaluate heart rate responsiveness and Consider nadolol or diltiazem  if beta-blockers ineffective.  - Continue to work on lipid-lowering with lipid clinic.  I think he would benefit from Leqvio with this shortness timeframe for side effects and the longest duration of therapy.  Also witnessed treatment.

## 2024-07-20 NOTE — Assessment & Plan Note (Signed)
 Allergic rhinitis with Chronic cough potentially related to allergies. Symptoms improve with antihistamines. - Recommend daily Zyrtec or Claritin  for allergy management. - Monitor cough and sinus symptoms for improvement.

## 2024-07-20 NOTE — Assessment & Plan Note (Signed)
 I really think anxiety and panic attacks are the main driving forces for his symptoms.  He is work with PCP plus or minus psychiatry to help follow-up with treatment.

## 2024-07-20 NOTE — Assessment & Plan Note (Addendum)
 Intermittent palpitations and tachycardia, primarily in the morning.  Symptoms suggest non-cardiac origin.  Most likely associate with anxiety Previous monitoring showed no significant arrhythmias. - Order 3-day cardiac monitor to assess for arrhythmias. - Evaluate monitor results for beta-blocker therapy need. -  Consider nadolol or diltiazem  if beta-blockers ineffective. - Monitor heart rate and symptoms during follow-up.

## 2024-07-20 NOTE — Assessment & Plan Note (Signed)
 Thankfully, his blood pressure is well-controlled and he is only on the amlodipine .  Would like to get him back on either beta-blocker or nondihydropyridine calcium  blocker.  For now continue amlodipine  10 mg daily

## 2024-07-20 NOTE — Assessment & Plan Note (Signed)
 He is having lots of difficulty with medications and has not tolerated atorvastatin  and rosuvastatin  with the doses that would be necessary for controlling his lipids.  Will have him touch back base with the [facility] lipid clinic run by our clinical pharmacist.  I truly think that Leqvio would be his best option as it would be witnessed therapy and only every 6 months once he gets the booster dose.  Least likely to have side effects.

## 2024-07-20 NOTE — Assessment & Plan Note (Signed)
 Hyperlipidemia with statin-associated myalgia and muscle cramps LDL levels not at goal. Statin-associated myalgia noted. Leqvio preferred for ease of administration and minimal side effects. Insurance coverage necessary. - Order lipid panel to assess cholesterol levels. - Refer to Lipid Clinic for hyperlipidemia management. - Discuss prior authorization for Leqvio. - Consider Repatha or Praluent if Leqvio not feasible.

## 2024-07-25 DIAGNOSIS — H11441 Conjunctival cysts, right eye: Secondary | ICD-10-CM | POA: Diagnosis not present

## 2024-07-25 DIAGNOSIS — H16229 Keratoconjunctivitis sicca, not specified as Sjogren's, unspecified eye: Secondary | ICD-10-CM | POA: Diagnosis not present

## 2024-07-25 DIAGNOSIS — H11121 Conjunctival concretions, right eye: Secondary | ICD-10-CM | POA: Diagnosis not present

## 2024-07-25 DIAGNOSIS — H2513 Age-related nuclear cataract, bilateral: Secondary | ICD-10-CM | POA: Diagnosis not present

## 2024-08-01 DIAGNOSIS — R002 Palpitations: Secondary | ICD-10-CM | POA: Diagnosis not present

## 2024-08-01 DIAGNOSIS — I2510 Atherosclerotic heart disease of native coronary artery without angina pectoris: Secondary | ICD-10-CM | POA: Diagnosis not present

## 2024-08-03 DIAGNOSIS — I2510 Atherosclerotic heart disease of native coronary artery without angina pectoris: Secondary | ICD-10-CM | POA: Diagnosis not present

## 2024-08-03 DIAGNOSIS — E785 Hyperlipidemia, unspecified: Secondary | ICD-10-CM | POA: Diagnosis not present

## 2024-08-03 NOTE — Progress Notes (Signed)
   08/03/2024  Patient ID: [name], male   DOB: Feb 16, 1961, 63 y.o.   MRN: [id number]  Pharmacy Quality Measure Review  This patient is appearing on a report for being at risk of failing the adherence measure for cholesterol (statin) medications this calendar year.    Medication: Atorvastatin  Last fill date: 07/28/24 for 90 day supply  Insurance report was not up to date. No action needed at this time.    Jon VEAR Lindau, PharmD Clinical Pharmacist [phone]

## 2024-08-04 ENCOUNTER — Encounter (HOSPITAL_COMMUNITY): Payer: Self-pay

## 2024-08-04 ENCOUNTER — Emergency Department (HOSPITAL_COMMUNITY)
Admission: EM | Admit: 2024-08-04 | Discharge: 2024-08-04 | Disposition: A | Discharge status: Home / Self Care | Attending: Emergency Medicine | Admitting: Emergency Medicine

## 2024-08-04 ENCOUNTER — Other Ambulatory Visit: Payer: Self-pay

## 2024-08-04 ENCOUNTER — Emergency Department (HOSPITAL_COMMUNITY)

## 2024-08-04 DIAGNOSIS — R079 Chest pain, unspecified: Secondary | ICD-10-CM | POA: Diagnosis not present

## 2024-08-04 DIAGNOSIS — R0789 Other chest pain: Secondary | ICD-10-CM | POA: Diagnosis not present

## 2024-08-04 DIAGNOSIS — Z7982 Long term (current) use of aspirin: Secondary | ICD-10-CM | POA: Insufficient documentation

## 2024-08-04 DIAGNOSIS — R002 Palpitations: Secondary | ICD-10-CM | POA: Diagnosis not present

## 2024-08-04 DIAGNOSIS — Z79899 Other long term (current) drug therapy: Secondary | ICD-10-CM | POA: Insufficient documentation

## 2024-08-04 DIAGNOSIS — I2510 Atherosclerotic heart disease of native coronary artery without angina pectoris: Secondary | ICD-10-CM | POA: Diagnosis not present

## 2024-08-04 DIAGNOSIS — Z981 Arthrodesis status: Secondary | ICD-10-CM | POA: Diagnosis not present

## 2024-08-04 LAB — BASIC METABOLIC PANEL WITH GFR
Anion gap: 14 (ref 5–15)
BUN: 13 mg/dL (ref 8–23)
CO2: 23 mmol/L (ref 22–32)
Calcium: 9.1 mg/dL (ref 8.9–10.3)
Chloride: 101 mmol/L (ref 98–111)
Creatinine, Ser: 1.27 mg/dL — ABNORMAL HIGH (ref 0.61–1.24)
GFR, Estimated: >60 mL/min (ref >60)
Glucose, Bld: 196 mg/dL — ABNORMAL HIGH (ref 70–99)
Potassium: 3.9 mmol/L (ref 3.5–5.1)
Sodium: 138 mmol/L (ref 135–145)

## 2024-08-04 LAB — HEPATIC FUNCTION PANEL
ALT: 67 IU/L — ABNORMAL HIGH (ref 0–44)
AST: 48 IU/L — ABNORMAL HIGH (ref 0–40)
Albumin: 4.5 g/dL (ref 3.9–4.9)
Alkaline Phosphatase: 78 IU/L (ref 47–123)
Bilirubin Total: 0.8 mg/dL (ref 0.0–1.2)
Bilirubin, Direct: 0.28 mg/dL (ref 0.00–0.40)
Total Protein: 7.3 g/dL (ref 6.0–8.5)

## 2024-08-04 LAB — CBC
HCT: 51.9 % (ref 39.0–52.0)
Hemoglobin: 17.6 g/dL — ABNORMAL HIGH (ref 13.0–17.0)
MCH: 30.7 pg (ref 26.0–34.0)
MCHC: 33.9 g/dL (ref 30.0–36.0)
MCV: 90.4 fL (ref 80.0–100.0)
Platelets: 231 K/uL (ref 150–400)
RBC: 5.74 MIL/uL (ref 4.22–5.81)
RDW: 12.8 % (ref 11.5–15.5)
WBC: 9 K/uL (ref 4.0–10.5)
nRBC: 0 % (ref 0.0–0.2)

## 2024-08-04 LAB — TROPONIN I (HIGH SENSITIVITY)
Troponin I (High Sensitivity): 5 ng/L (ref <18)
Troponin I (High Sensitivity): 5 ng/L (ref <18)

## 2024-08-04 LAB — LIPID PANEL
Chol/HDL Ratio: 2.4 ratio (ref 0.0–5.0)
Cholesterol, Total: 152 mg/dL (ref 100–199)
HDL: 63 mg/dL (ref >39)
LDL Chol Calc (NIH): 74 mg/dL (ref 0–99)
Triglycerides: 81 mg/dL (ref 0–149)
VLDL Cholesterol Cal: 15 mg/dL (ref 5–40)

## 2024-08-04 MED ORDER — DIAZEPAM 5 MG PO TABS
10.0000 mg | ORAL_TABLET | Freq: Once | ORAL | Status: AC
  Administered 2024-08-04: 10 mg via ORAL
  Filled 2024-08-04: qty 2

## 2024-08-04 NOTE — ED Provider Notes (Signed)
  Physical Exam  BP (!) 133/96   Pulse 83   Temp 97.8 F (36.6 C) (Oral)   Resp 15   Ht 5' 9 (1.753 m)   Wt 76.7 kg   SpO2 96%   BMI 24.96 kg/m   Physical Exam  Procedures  Procedures  ED Course / MDM    Medical Decision Making Care assumed at 3 PM.  Patient is here with chest pain.  Patient has daily chest pains and has been followed with Dr. Anner.  Patient just had a Holter monitor.  Patient was given Valium  and symptoms improved.  Signed out pending second troponin and low suspicion for ACS  4:06 PM Second troponin is negative.  Patient is feeling better now.  Told him to follow-up with Dr. Anner.  Told him to call cardiology regarding Holter monitor result  Problems Addressed: Chest pain, unspecified type: chronic illness or injury Palpitations: chronic illness or injury  Risk Prescription drug management.          Ronald Alm Macho, MD 08/04/24 [phone]

## 2024-08-04 NOTE — ED Triage Notes (Signed)
 Pt having chest pain since 0500, took ASA and nitroglycerin  at 1148. Pt had stent placed in 2024.

## 2024-08-04 NOTE — ED Provider Notes (Signed)
 [place] EMERGENCY DEPARTMENT AT [facility] Provider Note   CSN: [id number] Arrival date & time: 08/04/24  1224     Patient presents with: Chest Pain   [name] is a 63 y.o. male.  {Add pertinent medical, surgical, social history, OB history to HPI:3622} 63 year old male with prior medical history as detailed below presents for evaluation.  Patient reports known history of CAD.  He is followed by Dr. Anner.  He saw him in the office earlier this week.  Patient reports that almost every morning, he will have heavy heartbeats.  This morning he had right sided substernal chest discomfort.  His pain is much better now.  He reports that this discomfort that he experienced this morning was much different than his typical morning symptoms.  He denies diaphoresis, nausea, vomiting, shortness of breath.  He tried a sublingual nitroglycerin  at home without significant proving his symptoms.  He is visibly anxious on evaluation.  He is requesting that he get some Valium .  He takes Valium  at home for anxiety.  He declines an IV if at all possible.  He reports that IVs and needles make him more anxious.  The history is provided by the patient and medical records.       Prior to Admission medications   Medication Sig Start Date End Date Taking? Authorizing Provider  acyclovir  (ZOVIRAX ) 800 MG tablet Take 1 tablet (800 mg total) by mouth 2 (two) times daily. 09/01/23   Johnny Garnette LABOR, MD  acyclovir  ointment (ZOVIRAX ) 5 % APPLY 1 APPLICATION TOPICALLY EVERY 3 (THREE) HOURS. 01/10/24   Johnny Garnette LABOR, MD  amLODipine  (NORVASC ) 10 MG tablet TAKE ONE TABLET (10 MG) BY MOUTH EVERY EVENING 06/18/24   Johnny Garnette LABOR, MD  aspirin  EC 81 MG tablet Take 1 tablet (81 mg total) by mouth daily. Swallow whole. 12/12/22   Gonfa, Taye T, MD  atorvastatin  (LIPITOR) 40 MG tablet Take 1 tablet (40 mg total) by mouth daily. 05/02/24 07/31/24  Anner Alm ORN, MD  cetirizine (ZYRTEC) 10 MG tablet Take 10  mg by mouth daily.    [provider]  diazepam  (VALIUM ) 10 MG tablet Take 10 mg by mouth 3 (three) times daily.    [provider]  diclofenac  (VOLTAREN ) 50 MG EC tablet Take 1 tablet (50 mg total) by mouth 2 (two) times daily. 04/01/24   Reddick, Johnathan B, NP  diclofenac  sodium (VOLTAREN ) 1 % GEL Apply 1 application  topically daily as needed (For pain).    [provider]  EPINEPHrine  0.3 mg/0.3 mL IJ SOAJ injection USE AS DIRECTED 09/01/23   Johnny Garnette LABOR, MD  fentaNYL  (DURAGESIC  - DOSED MCG/HR) 50 MCG/HR Place 50 mcg onto the skin every other day.    [provider]  fluticasone  (FLONASE ) 50 MCG/ACT nasal spray Place 2 sprays into both nostrils daily. 09/01/23   Johnny Garnette LABOR, MD  isosorbide mononitrate (IMDUR) 30 MG 24 hr tablet Take 0.5 tablets (15 mg total) by mouth daily. 07/03/24   Rana Lum CROME, NP  lactulose, encephalopathy, (CHRONULAC) 10 GM/15ML SOLN Take 20 g by mouth at bedtime.    [provider]  METHOCARBAMOL PO Take by mouth as needed.    [provider]  nitroGLYCERIN  (NITROSTAT ) 0.4 MG SL tablet Place 1 tablet (0.4 mg total) under the tongue every 5 (five) minutes as needed for chest pain. 07/12/23 07/18/24  Jerilynn Lamarr HERO, NP  oxyCODONE  (OXY IR/ROXICODONE ) 5 MG immediate release tablet Take 5  mg by mouth every 6 (six) hours as needed for moderate pain. 08/25/16   [provider]  promethazine (PHENERGAN) 25 MG tablet Take 12.5 mg by mouth every 6 (six) hours as needed for nausea or vomiting.    [provider]  propranolol  (INDERAL ) 10 MG tablet TAKE 1 TABLET BY MOUTH EVERYDAY AT BEDTIME 09/26/23   Johnny Garnette LABOR, MD  scopolamine  (TRANSDERM SCOP , 1.5 MG,) 1 MG/3DAYS Place 1 patch (1.5 mg total) onto the skin every 3 (three) days. 03/16/23   Johnny Garnette LABOR, MD  traZODone  (DESYREL ) 100 MG tablet Take 50 mg by mouth at bedtime.    [provider]  triamcinolone  cream (KENALOG ) 0.1 % Apply 1  application topically 2 (two) times daily. 09/10/15   Nafziger, Darleene, NP    Allergies: Animator, Metoprolol  succinate [metoprolol ], Codeine, Cyclobenzaprine, Docusate sodium, Dulcolax stool softener [dss], Gabapentin , Ketorolac, Ketorolac tromethamine, Lisinopril , Methadone, Pregabalin , and Statins    Review of Systems  All other systems reviewed and are negative.   Updated Vital Signs BP (!) 158/92   Pulse (!) 107   Temp 98.3 F (36.8 C)   Resp 15   Ht 5' 9 (1.753 m)   Wt 76.7 kg   SpO2 98%   BMI 24.96 kg/m   Physical Exam Vitals and nursing note reviewed.  Constitutional:      General: He is not in acute distress.    Appearance: Normal appearance. He is well-developed.  HENT:     Head: Normocephalic and atraumatic.  Eyes:     Conjunctiva/sclera: Conjunctivae normal.     Pupils: Pupils are equal, round, and reactive to light.  Cardiovascular:     Rate and Rhythm: Normal rate and regular rhythm.     Heart sounds: Normal heart sounds.  Pulmonary:     Effort: Pulmonary effort is normal. No respiratory distress.     Breath sounds: Normal breath sounds.  Abdominal:     General: There is no distension.     Palpations: Abdomen is soft.     Tenderness: There is no abdominal tenderness.  Musculoskeletal:        General: No deformity. Normal range of motion.     Cervical back: Normal range of motion and neck supple.  Skin:    General: Skin is warm and dry.  Neurological:     General: No focal deficit present.     Mental Status: He is alert and oriented to person, place, and time.     (all labs ordered are listed, but only abnormal results are displayed) Labs Reviewed  CBC - Abnormal; Notable for the following components:      Result Value   Hemoglobin 17.6 (*)    All other components within normal limits  BASIC METABOLIC PANEL WITH GFR  TROPONIN I (HIGH SENSITIVITY)    EKG: EKG Interpretation Date/Time:  Saturday August 04 2024 12:30:59 EST Ventricular Rate:   117 PR Interval:  136 QRS Duration:  82 QT Interval:  320 QTC Calculation: 446 R Axis:   -82  Text Interpretation: Sinus tachycardia Left axis deviation Abnormal ECG When compared with ECG of 18-Jul-2024 13:35, PREVIOUS ECG IS PRESENT Confirmed by Laurice Coy [phone]) on 08/04/2024 12:54:52 PM  Radiology: No results found.  {Document cardiac monitor, telemetry assessment procedure when appropriate:32947} Procedures   Medications Ordered in the ED  diazepam  (VALIUM ) tablet 10 mg (has no administration in time range)      {Click here for ABCD2, HEART and other calculators REFRESH  Note before signing:1}                              Medical Decision Making Patient with nonspecific chest discomfort that awoke him from sleep.  He apparently reports some type of palpitations or chest discomfort nearly every morning.  Patient with known history of NSTEMI.  Obtain EKG is without evidence of acute ischemia.  First troponin is reassuringly low at 5.  Chest x-ray obtained without acute abnormality.  Other obtained labs thus far was without acute abnormality.  Risk Prescription drug management.   ***  {Document critical care time when appropriate  Document review of labs and clinical decision tools ie CHADS2VASC2, etc  Document your independent review of radiology images and any outside records  Document your discussion with family members, caretakers and with consultants  Document social determinants of health affecting pt's care  Document your decision making why or why not admission, treatments were needed:32947:::1}   Final diagnoses:  None    ED Discharge Orders     None

## 2024-08-04 NOTE — ED Provider Triage Note (Signed)
 Emergency Medicine Provider Triage Evaluation Note  [name] , a 63 y.o. male  was evaluated in triage.  Pt complains of chest pain. Reports pain woke him up at 5 am this morning, is right sided and has persisted since. Does not feel like the NSTEMI he had previously.  He has been having some discomfort in the mornings for some time and has been evaluated by cardiology for this without clear cause. Reports this morning the pain was worse than normal. Denies shortness of breath, no leg pain or leg swelling  Review of Systems  Positive:  Negative:   Physical Exam  Ht 5' 9 (1.753 m)   Wt 76.7 kg   BMI 24.96 kg/m  Gen:   Awake, no distress   Resp:  Normal effort  MSK:   Moves extremities without difficulty  Other:    Medical Decision Making  Medically screening exam initiated at 12:29 PM.  Appropriate orders placed.  [name] was informed that the remainder of the evaluation will be completed by another provider, this initial triage assessment does not replace that evaluation, and the importance of remaining in the ED until their evaluation is complete.     Nora Lauraine LABOR, PA-C 08/04/24 1231

## 2024-08-04 NOTE — Discharge Instructions (Signed)
 As we discussed, your heart enzyme tests are normal today.  Please call cardiology office next week for follow-up and regarding your Holter monitor results  Return to ER if you have worse chest pain or shortness of breath or palpitations

## 2024-08-07 NOTE — Progress Notes (Signed)
   08/07/2024  Patient ID: [name], male   DOB: [birth year], 62 y.o.   MRN: [id number]  Pharmacy Quality Measure Review  This patient is appearing on a report for being at risk of failing the adherence measure for cholesterol (statin) medications this calendar year.    Medication: Atorvastatin  Last fill date: 07/28/24 for 90 day supply  Insurance report was not up to date. No action needed at this time.    [name], PharmD Clinical Pharmacist [phone]

## 2024-08-14 ENCOUNTER — Ambulatory Visit: Payer: Self-pay | Admitting: Cardiology

## 2024-08-14 DIAGNOSIS — G894 Chronic pain syndrome: Secondary | ICD-10-CM | POA: Diagnosis not present

## 2024-08-14 DIAGNOSIS — E785 Hyperlipidemia, unspecified: Secondary | ICD-10-CM

## 2024-08-14 DIAGNOSIS — M79674 Pain in right toe(s): Secondary | ICD-10-CM | POA: Diagnosis not present

## 2024-08-14 DIAGNOSIS — Z79891 Long term (current) use of opiate analgesic: Secondary | ICD-10-CM | POA: Diagnosis not present

## 2024-08-14 DIAGNOSIS — G5752 Tarsal tunnel syndrome, left lower limb: Secondary | ICD-10-CM | POA: Diagnosis not present

## 2024-08-14 DIAGNOSIS — I2510 Atherosclerotic heart disease of native coronary artery without angina pectoris: Secondary | ICD-10-CM

## 2024-08-15 ENCOUNTER — Encounter: Payer: Self-pay | Admitting: Pharmacist Clinician (PhC)/ Clinical Pharmacy Specialist

## 2024-08-16 ENCOUNTER — Ambulatory Visit (INDEPENDENT_AMBULATORY_CARE_PROVIDER_SITE_OTHER): Admitting: Clinical

## 2024-08-16 DIAGNOSIS — F419 Anxiety disorder, unspecified: Secondary | ICD-10-CM | POA: Diagnosis not present

## 2024-08-16 NOTE — Progress Notes (Signed)
   Darice Seats, LCSW

## 2024-08-16 NOTE — Progress Notes (Signed)
 [facility] Counselor/Therapist Progress Note  Patient ID: [name], MRN: [id number],    Date: 08/16/2024  Time Spent: 1:33pm - 2:20pm : 47 minutes  Treatment Type: Individual Therapy  Reported Symptoms: none reported  Mental Status Exam: Appearance:  Neat and Well Groomed     Behavior: Appropriate  Motor: Normal  Speech/Language:  Clear and Coherent and Normal Rate  Affect: Appropriate  Mood: normal  Thought process: normal  Thought content:   WNL  Sensory/Perceptual disturbances:   WNL  Orientation: oriented to person, place, time/date, and situation  Attention: Good  Concentration: Good  Memory: WNL  Fund of knowledge:  Good  Insight:   Good  Judgment:  Good  Impulse Control: Good   Risk Assessment: Danger to Self:  No Patient denied current suicidal ideation  Self-injurious Behavior: No Danger to Others: No Patient denied current homicidal ideation Duty to Warn:no Physical Aggression / Violence:No  Access to Firearms a concern: No  Gang Involvement:No   Subjective: Patient reported last Saturday patient was in the emergency room due to concern of cardiac symptoms. Patient stated, I've got something going on with my heart and they don't know what it is, its very frustrating to me. Patient reported patient has a follow up appointment with patient's cardiologist in three months. Patient reported patient rejoined the HOA (homeowner's association) board. Patient reported patient decided to rejoin the [facility] board as a result of a board member trying to invite individuals living outside of the community to join the board. Patient stated, so far so good in response to rejoining the board. Patient stated, alright in response to mood since last session. Patient stated, trying to stay active under the circumstances. Patient reported patient noted thoughts related to gain some understanding of why I think the way I do in patient's thought record.  Patient reported patient practiced 5,4,3,2,1 grounding exercise this morning and continues to utilize the calm app.   Interventions: Cognitive Behavioral Therapy. Clinician conducted session via caregility video from clinician's office at [facility]. Patient provided verbal consent to proceed with telehealth session and is aware of limitations of telephone or video visits. Patient participated in session from patient's home. Reviewed events since last session and assessed for changes. Discussed recent hospitalization and patient's health concerns. Explored patient's decision to rejoin the [facility] board. Reviewed thought record. Reviewed grounding exercises and additional coping strategies patient practices to maintain patient's mental health. Clinician requested for homework patient complete thought record.   Collaboration of Care: Other not required at this time   Diagnosis:  Anxiety disorder, unspecified type     Plan: Patient is to utilize [employer] Therapy, thought re-framing, relaxation techniques, opposite action, mindfulness and coping strategies to decrease symptoms associated with their diagnosis. Frequency: monthly  Modality: individual      Long-term goal:   Reduce overall level, frequency, and intensity of the feelings of anxiety as evidenced by decreased increased heart rate, shortness of breath, decreased energy, loss of interest from 7 days/week to 0 to 1 days/week per patient report for at least 3 consecutive months. Target Date: 06/26/25  Progress: progressing    Short-term goal:  Increase patient's awareness of patient's verbal and nonverbal communication with others and how patient's communication is perceived by others  Target Date: 06/26/24  Progress: patient reported as of 07/02/24 patient feelings this goal has been met    Develop and implement healthy communication strategies for patient to utilize when expressing his thoughts and feelings to  others     Target Date: 06/26/25  Progress: progressing    Develop and implement coping strategies for patient to utilize to decrease the negative impact chronic pain has on patient's ability to participate in family activities Target Date: 06/26/25  Progress: progressing    Maintain physical activity 3-5 days per week.  Target Date: 06/26/24  Progress: patient reported as of 07/02/24 patient feels this goal has been met    Increase patient's participation in activities patient enjoys, such as, Tai Chi, teaching opportunities, volunteer opportunities. Target Date: 06/26/25  Progress: progressing       Ronald Seats, LCSW

## 2024-08-27 ENCOUNTER — Ambulatory Visit: Admitting: Pharmacist

## 2024-09-13 DIAGNOSIS — I2510 Atherosclerotic heart disease of native coronary artery without angina pectoris: Secondary | ICD-10-CM

## 2024-09-13 DIAGNOSIS — R002 Palpitations: Secondary | ICD-10-CM | POA: Diagnosis not present

## 2024-09-28 ENCOUNTER — Encounter

## 2024-10-05 ENCOUNTER — Ambulatory Visit: Admitting: Clinical

## 2024-10-10 ENCOUNTER — Ambulatory Visit: Attending: Cardiology | Admitting: Cardiology

## 2024-10-10 ENCOUNTER — Encounter: Payer: Self-pay | Admitting: Cardiology

## 2024-10-10 VITALS — BP 137/74 | HR 103 | Ht 68.0 in | Wt 172.0 lb

## 2024-10-10 DIAGNOSIS — M791 Myalgia, unspecified site: Secondary | ICD-10-CM

## 2024-10-10 DIAGNOSIS — F32A Depression, unspecified: Secondary | ICD-10-CM | POA: Diagnosis not present

## 2024-10-10 DIAGNOSIS — I479 Paroxysmal tachycardia, unspecified: Secondary | ICD-10-CM | POA: Diagnosis not present

## 2024-10-10 DIAGNOSIS — E785 Hyperlipidemia, unspecified: Secondary | ICD-10-CM | POA: Diagnosis not present

## 2024-10-10 DIAGNOSIS — F419 Anxiety disorder, unspecified: Secondary | ICD-10-CM | POA: Diagnosis not present

## 2024-10-10 DIAGNOSIS — I214 Non-ST elevation (NSTEMI) myocardial infarction: Secondary | ICD-10-CM

## 2024-10-10 DIAGNOSIS — I25119 Atherosclerotic heart disease of native coronary artery with unspecified angina pectoris: Secondary | ICD-10-CM | POA: Diagnosis not present

## 2024-10-10 DIAGNOSIS — I10 Essential (primary) hypertension: Secondary | ICD-10-CM

## 2024-10-10 DIAGNOSIS — R5383 Other fatigue: Secondary | ICD-10-CM

## 2024-10-10 DIAGNOSIS — I2510 Atherosclerotic heart disease of native coronary artery without angina pectoris: Secondary | ICD-10-CM

## 2024-10-10 DIAGNOSIS — T466X5A Adverse effect of antihyperlipidemic and antiarteriosclerotic drugs, initial encounter: Secondary | ICD-10-CM

## 2024-10-10 DIAGNOSIS — T466X5D Adverse effect of antihyperlipidemic and antiarteriosclerotic drugs, subsequent encounter: Secondary | ICD-10-CM | POA: Diagnosis not present

## 2024-10-10 MED ORDER — NEXLIZET 180-10 MG PO TABS: 1.0000 | ORAL_TABLET | Freq: Every day | ORAL | 11 refills | Status: AC

## 2024-10-10 MED ORDER — AMLODIPINE BESYLATE 10 MG PO TABS: 10.0000 mg | ORAL_TABLET | Freq: Every morning | ORAL | Status: AC

## 2024-10-10 MED ORDER — DILTIAZEM HCL ER COATED BEADS 120 MG PO CP24: 120.0000 mg | ORAL_CAPSULE | Freq: Every day | ORAL | 3 refills | Status: AC

## 2024-10-10 NOTE — Progress Notes (Signed)
 " Cardiology Office Note:  .   Date:  10/15/2024  ID:  [name], DOB 1960/11/25, MRN [id number] PCP: Johnny Garnette LABOR, MD  [facility] Providers Cardiologist:  Alm Clay, MD     Chief Complaint  Patient presents with   Follow-up    3 months   Palpitations    Morning fast heart rates; recent monitor   Coronary Artery Disease    No active angina,.  Concerns of fatigue    Patient Profile: .     [name] is a 64 y.o. male with a PMH notable for CAD/non-STEMI, HTN, HLD along with GAD & chronic pain who presents here for 3 month at the request of Johnny Garnette LABOR, MD.  [name] 2024: 99% LCx (DES PCI with Synergy XD 2.25 x 24 mm postdilated 2.5 mm.) HTN HLD Chronic anxiety and pain  [name] was last seen on July 18, 2024.  Still noting morning tachycardia spells.  Blood pressure is all over the place.  Atypical TAVR chest discomfort but not associated with exercise.  He had had significant palpitation issues with Brilinta , better which switching to aspirin  alone.  LDL gone up after stopping statin, I was restarted on atorvastatin , because apparently Livalo was not covered.  He is still noticing mild fatigue and sharp left-sided chest discomfort.  We have stopped his 15 mg of Imdur , and he was not taking propranolol .  Had not tolerated beta-blockers.  Consider possibly using diltiazem  versus nadolol.  Also discussed potentially starting Leqvio/Repatha or Praluent.  3-day cardiac monitor ordered.  Still having difficulty with atorvastatin .   Subjective  Discussed the use of AI scribe software for clinical note transcription with the patient, who gave verbal consent to proceed.  History of Present Illness [name] is a 64 year old male with coronary artery disease who presents with episodes of heart racing upon waking. He has a history of a heart attack in March 2024 and is currently taking atorvastatin  40 mg for  cholesterol management, with his cholesterol level reported to be around 74 mg/dL. He also takes CoQ10 and amlodipine  10 mg.   He experiences episodes of heart racing upon waking, described as a sensation of his heart beating fast or fluttering. These episodes occur every morning and typically resolve after about an hour, often after he gets up and has coffee. No associated chest pain, but he notes a sensation of fluttering. A previous heart monitor showed normal heart rhythm with heart rates ranging from 55 to 144 beats per minute and rare premature beats.    He has previously tried several beta blockers but experienced intolerable side effects.  He mentions experiencing fatigue and has tried taking B vitamins but stopped due to concerns about potential neuropathy. He reports numbness in his feet and suspects tarsal tunnel syndrome. No shortness of breath, heart racing, or dizziness outside of the morning episodes.  His family history includes a sister who is on Repatha for cholesterol management.  Cardiovascular ROS: no chest pain or dyspnea on exertion positive for - irregular heartbeat, palpitations, rapid heart rate, and fatigue negative for - edema, orthopnea, paroxysmal nocturnal dyspnea, shortness of breath, or syncope/near syncope, TIA/amaurosis fugax, claudication; melena / hematochezia / melena    Objective   MEDICATIONS: Amlodipine  10 mg daily ASA 81 mg daily Atorvastatin  40 mg dialy; Co Q 10 30 mg TID Valium  10 mg TID; Fentanyl  Patch 50 mcg daily & Votaraen Gel; Scopolamine  1 mg patch (  1.5 mg) TID; PRN Oxycondone 5 mg q hr; Trazodone  100 mg (1/2 tab) daily  Studies Reviewed: .        Results Labs Cholesterol (07/2024): 74 Lab Results  Component Value Date   CHOL 152 08/03/2024   HDL 63 08/03/2024   LDLCALC 74 08/03/2024   LDLDIRECT 179.9 12/18/2012   TRIG 81 08/03/2024   CHOLHDL 2.4 08/03/2024   Lab Results  Component Value Date   NA 138 08/04/2024   K 3.9  08/04/2024   CREATININE 1.27 (H) 08/04/2024   GFRNONAA >60 08/04/2024   GLUCOSE 196 (H) 08/04/2024   Lab Results  Component Value Date   HGBA1C 5.9 09/01/2023   Lab Results  Component Value Date   WBC 9.0 08/04/2024   HGB 17.6 (H) 08/04/2024   HCT 51.9 08/04/2024   MCV 90.4 08/04/2024   PLT 231 08/04/2024     Diagnostic Ambulatory cardiac monitor (06/2024): Normal sinus rhythm with heart rate ranging from 55 to 144 bpm, rare premature atrial and ventricular beats, no arrhythmias during reported symptoms; heart rate during episode of palpitations was 85 to 94 bpm. Ambulatory cardiac monitor (03/2023): Normal sinus rhythm with several brief episodes of 3-4 beat runs, no significant arrhythmias.  Prior Studies 6-7-day Zio patch monitor: (July 2024) minimal abnormalities.  Predominantly sinus rhythm.  Rare PACs and PVCs.  2 very brief episodes of nonsustained atrial tachycardia 4-5 beats.  No evidence of arrhythmia. ECHO: (12/11/2022) LVEF 60 to 65%.  No RWMA.  GR 1 DD.  Normal RV.  Normal PAP.  Normal RAP.  Normal valves. CATH-PCI: (12/10/2022) culprit lesion 99% mid LCx (subtotal occlusion, with sequential lesion into branching OM) => DES PCI Synergy XD 2.25 x 24 mm.  Minimal LM/LAD disease.  30% proximal RCA, 50% mid RCA.  Mild anterolateral hypokinesis with LVEF of 55%.   Recommendations: Aggressive medical therapy, DAPT with aspirin  and ticagrelor  at least 12 months without an interruption.  As long as no complications arise, hospital discharge tomorrow. Diagnostic: Dominance: Right                                            Intervention   Risk Assessment/Calculations:          Physical Exam:   VS:  BP 137/74 (BP Location: Left Arm, Patient Position: Sitting, Cuff Size: Normal)   Pulse (!) 103   Ht 5' 8 (1.727 m)   Wt 172 lb (78 kg)   SpO2 95%   BMI 26.15 kg/m    Wt Readings from Last 3 Encounters:  10/10/24 172 lb (78 kg)  08/04/24 169 lb (76.7 kg)  07/18/24 169 lb  (76.7 kg)    GEN: Well nourished, well groomed; in no acute distress; as usual he is somewhat anxious NECK: No JVD; No carotid bruits CARDIAC: Normal S1, S2; tachycardic with regular rhythm. no murmurs, rubs, gallops RESPIRATORY:  Clear to auscultation without rales, wheezing or rhonchi ; nonlabored, good air movement. ABDOMEN: Soft, non-tender, non-distended EXTREMITIES:  No edema; No deformity      ASSESSMENT AND PLAN: .   Fatigue Previously noted fatigue with Beta Blocker, but with palpitations & paroxysmal tachycardia would like to try Diltiazem  --> - Initiate Diltiazem  120 mg at bedtime & change Amlodipine  to AM  With ~myalgias & fatigue, will hold Statin x 1 month & plan to convert to Nexlitol/Zeita in 1 month.  NSTEMI (non-ST elevated myocardial infarction) (HCC) Almost 2 years out from his MI with LCx PCI.  Treating RCA disease medically. Echo with preserved EF and normal wall motion arguing against significant MI. He remains physically active, but has what sounds like noncardiac chest discomfort episodes. Mild to this, resolves around his GAD.  CAD- 1 Vessel, DES PCI LCx. (NSTEMI) Two-vessel disease with severe LCx disease treated with small caliber DES.  Has modest disease in the RCA being treated medically.  Not really having any current anginal type chest pain.  Just simply sensation of tachycardia. Due to intolerance, was taken off diet.  And continued on 81 mg aspirin  monotherapy Continue amlodipine  10 mg daily was switched to a.m. dosing as we are adding diltiazem  120 mg nightly for his palpitation episodes but also maintains on Levophed. Has been intolerant to beta-blockers, therefore we are choosing to use 2 different types of calcium  channel blockers. Need to address lipid management-with him having significant fatigue and myalgias, we will do another 1 month statin holiday to see if symptoms improve. After 1 month, we will start Nexletol  Essential hypertension,  benign Mildly elevated blood pressure on amlodipine  10 mg daily. -Continue amlodipine  but switch to a.m. dose and add diltiazem  120 mg nightly  Paroxysmal tachycardia (HCC) ntermittent palpitations and sinus tachycardia likely due to adrenaline surge upon waking. Heart rate 55-144 bpm, no dangerous arrhythmias. Symptoms may be exacerbated by anxiety. - Initiated diltiazem  120 mg daily at bedtime. - Instructed to take amlodipine  10 mg in the morning.  Hyperlipidemia with target low density lipoprotein (LDL) cholesterol less than 55 mg/dL Managed with atorvastatin  40 mg. LDL at 74 mg/dL.  Discussed Nexletol addition and potential use of Repatha or Lecvio.  Discussed statin side effects and alternatives => hold atorvastatin  x 1 month to see if symptoms improve.  If no change, would then reinitiate statin along with Nexletol. - Start Nexlizet  180-10 mg after one month on diltiazem  to avoid confusing symptoms.. - Recheck cholesterol levels before next visit in six months.  Myalgia due to statin Difficulty with atorvastatin  and rosuvastatin .  We are trying a 1 month holiday again. Will also plan to start Nexlizet  at 1 month statin holiday. Reassess lipids in 6 months, if not at goal, we can then justify looking into Leqvio as the best option but is not could also consider Repatha/Praluent.   Orders Placed This Encounter  Procedures   Hepatic function panel   Lipid panel   Meds ordered this encounter  Medications   Bempedoic Acid-Ezetimibe (NEXLIZET ) 180-10 MG TABS    Sig: Take 1 tablet by mouth daily.    Dispense:  30 tablet    Refill:  11    WILL CALL WHEN READY TO PICK UP   diltiazem  (CARDIZEM  CD) 120 MG 24 hr capsule    Sig: Take 1 capsule (120 mg total) by mouth at bedtime.    Dispense:  90 capsule    Refill:  3    AWARE PT IS ON AMLODIPINE  10 MG   amLODipine  (NORVASC ) 10 MG tablet    Sig: Take 1 tablet (10 mg total) by mouth every morning.    FOR NEXT FILL LAST DISP           Follow-Up: Return in about 6 months (around 04/09/2025) for 6 month follow-up with me, [employer].  I spent 46 minutes in the care of PARTH MCCORMAC today including reviewing labs (2 minutes), reviewing studies (6 minutes), face to  face time discussing treatment options (24 minutes), reviewing records from previous notes with [facility] (3 minutes), 11 minutes, and documenting in the encounter.      Signed, Alm MICAEL Clay, MD, MS Alm Clay, M.D., M.S. Interventional Cardiologist  [facility] Pager # [phone]      "

## 2024-10-10 NOTE — Patient Instructions (Addendum)
 Medication Instructions:   DO NOT TAKE ATORVASTATN FOR 30  DAYS IF SYMPTOMS DO NOT GO AWAY- RESTART ,  THEN  START TAKING  NEXLIZET   180/10 MG DAILY   AMLODIPINE  IN THE MORNING  DILTIAZEM   CD 120 MG  AT BEDTIME  *If you need a refill on your cardiac medications before your next appointment, please call your pharmacy*   Lab Work: FASTING  6 MONTHS(JULY 2026) Lipid  HEPATIC PANEL  If you have labs (blood work) drawn today and your tests are completely normal, you will receive your results only by: MyChart Message (if you have MyChart) OR A paper copy in the mail If you have any lab test that is abnormal or we need to change your treatment, we will call you to review the results.   Testing/Procedures:  NOT NEEDED  Follow-Up: At [facility], you and your health needs are our priority.  As part of our continuing mission to provide you with exceptional heart care, we have created designated Provider Care Teams.  These Care Teams include your primary Cardiologist (physician) and Advanced Practice Providers (APPs -  Physician Assistants and Nurse Practitioners) who all work together to provide you with the care you need, when you need it.     Your next appointment:   6 month(s)  AFTER LABS  The format for your next appointment:   In Person  Provider:   Alm Clay, MD   Other Instructions

## 2024-10-13 ENCOUNTER — Encounter: Payer: Self-pay | Admitting: Cardiology

## 2024-10-15 ENCOUNTER — Telehealth: Payer: Self-pay | Admitting: Cardiology

## 2024-10-15 ENCOUNTER — Encounter: Payer: Self-pay | Admitting: Cardiology

## 2024-10-15 DIAGNOSIS — I2510 Atherosclerotic heart disease of native coronary artery without angina pectoris: Secondary | ICD-10-CM | POA: Insufficient documentation

## 2024-10-15 NOTE — Assessment & Plan Note (Signed)
 Two-vessel disease with severe LCx disease treated with small caliber DES.  Has modest disease in the RCA being treated medically.  Not really having any current anginal type chest pain.  Just simply sensation of tachycardia. Due to intolerance, was taken off diet.  And continued on 81 mg aspirin  monotherapy Continue amlodipine  10 mg daily was switched to a.m. dosing as we are adding diltiazem  120 mg nightly for his palpitation episodes but also maintains on Levophed. Has been intolerant to beta-blockers, therefore we are choosing to use 2 different types of calcium  channel blockers. Need to address lipid management-with him having significant fatigue and myalgias, we will do another 1 month statin holiday to see if symptoms improve. After 1 month, we will start Nexletol

## 2024-10-15 NOTE — Assessment & Plan Note (Addendum)
 Managed with atorvastatin  40 mg. LDL at 74 mg/dL.  Discussed Nexletol addition and potential use of Repatha or Lecvio.  Discussed statin side effects and alternatives => hold atorvastatin  x 1 month to see if symptoms improve.  If no change, would then reinitiate statin along with Nexletol. - Start Nexlizet  180-10 mg after one month on diltiazem  to avoid confusing symptoms.. - Recheck cholesterol levels before next visit in six months.

## 2024-10-15 NOTE — Assessment & Plan Note (Signed)
 Previously noted fatigue with Beta Blocker, but with palpitations & paroxysmal tachycardia would like to try Diltiazem  --> - Initiate Diltiazem  120 mg at bedtime & change Amlodipine  to AM  With ~myalgias & fatigue, will hold Statin x 1 month & plan to convert to Nexlitol/Zeita in 1 month.

## 2024-10-15 NOTE — Assessment & Plan Note (Signed)
 ntermittent palpitations and sinus tachycardia likely due to adrenaline surge upon waking. Heart rate 55-144 bpm, no dangerous arrhythmias. Symptoms may be exacerbated by anxiety. - Initiated diltiazem  120 mg daily at bedtime. - Instructed to take amlodipine  10 mg in the morning.

## 2024-10-15 NOTE — Assessment & Plan Note (Addendum)
 Difficulty with atorvastatin  and rosuvastatin .  We are trying a 1 month holiday again. Will also plan to start Nexlizet  at 1 month statin holiday. Reassess lipids in 6 months, if not at goal, we can then justify looking into Leqvio as the best option but is not could also consider Repatha/Praluent.

## 2024-10-15 NOTE — Assessment & Plan Note (Signed)
 Almost 2 years out from his MI with LCx PCI.  Treating RCA disease medically. Echo with preserved EF and normal wall motion arguing against significant MI. He remains physically active, but has what sounds like noncardiac chest discomfort episodes. Mild to this, resolves around his GAD.

## 2024-10-15 NOTE — Assessment & Plan Note (Signed)
 Mildly elevated blood pressure on amlodipine  10 mg daily. -Continue amlodipine  but switch to a.m. dose and add diltiazem  120 mg nightly

## 2024-10-15 NOTE — Telephone Encounter (Signed)
 Patient also sent MyChart message re: same issue. Replied to patient via MyChart and sent message to MD/RN

## 2024-10-15 NOTE — Telephone Encounter (Signed)
 Pt c/o medication issue:  1. Name of Medication:   diltiazem  (CARDIZEM  CD) 120 MG 24 hr capsule    2. How are you currently taking this medication (dosage and times per day)? N/a  3. Are you having a reaction (difficulty breathing--STAT)? no  4. What is your medication issue?   Diltiazem  is causing all over body pain. Pt stopped taking it on 1/17. Please advise on next steps.

## 2024-10-29 ENCOUNTER — Ambulatory Visit: Admitting: Clinical

## 2024-10-29 DIAGNOSIS — F419 Anxiety disorder, unspecified: Secondary | ICD-10-CM

## 2024-10-29 NOTE — Progress Notes (Signed)
   Darice Seats, LCSW

## 2024-11-08 ENCOUNTER — Encounter: Admitting: Family Medicine
